# Patient Record
Sex: Female | Born: 1940
Health system: Southern US, Community
[De-identification: ages and names within clinical notes are randomized; demographics above are authoritative.]

## PROBLEM LIST (undated history)

## (undated) DIAGNOSIS — U071 COVID-19: Secondary | ICD-10-CM

## (undated) DIAGNOSIS — I10 Essential (primary) hypertension: Secondary | ICD-10-CM

## (undated) DIAGNOSIS — F32A Depression, unspecified: Secondary | ICD-10-CM

## (undated) DIAGNOSIS — I701 Atherosclerosis of renal artery: Secondary | ICD-10-CM

## (undated) DIAGNOSIS — M51369 Other intervertebral disc degeneration, lumbar region without mention of lumbar back pain or lower extremity pain: Secondary | ICD-10-CM

## (undated) DIAGNOSIS — F329 Major depressive disorder, single episode, unspecified: Secondary | ICD-10-CM

## (undated) DIAGNOSIS — I5189 Other ill-defined heart diseases: Secondary | ICD-10-CM

## (undated) DIAGNOSIS — C801 Malignant (primary) neoplasm, unspecified: Secondary | ICD-10-CM

## (undated) DIAGNOSIS — E7800 Pure hypercholesterolemia, unspecified: Secondary | ICD-10-CM

## (undated) DIAGNOSIS — I739 Peripheral vascular disease, unspecified: Secondary | ICD-10-CM

## (undated) DIAGNOSIS — M5430 Sciatica, unspecified side: Secondary | ICD-10-CM

## (undated) DIAGNOSIS — R079 Chest pain, unspecified: Secondary | ICD-10-CM

## (undated) DIAGNOSIS — F419 Anxiety disorder, unspecified: Secondary | ICD-10-CM

## (undated) DIAGNOSIS — J310 Chronic rhinitis: Secondary | ICD-10-CM

## (undated) DIAGNOSIS — I773 Arterial fibromuscular dysplasia: Secondary | ICD-10-CM

## (undated) DIAGNOSIS — E039 Hypothyroidism, unspecified: Secondary | ICD-10-CM

## (undated) DIAGNOSIS — J329 Chronic sinusitis, unspecified: Secondary | ICD-10-CM

## (undated) DIAGNOSIS — M5136 Other intervertebral disc degeneration, lumbar region: Secondary | ICD-10-CM

## (undated) DIAGNOSIS — I2510 Atherosclerotic heart disease of native coronary artery without angina pectoris: Secondary | ICD-10-CM

## (undated) DIAGNOSIS — J40 Bronchitis, not specified as acute or chronic: Secondary | ICD-10-CM

## (undated) HISTORY — DX: Arterial fibromuscular dysplasia: I77.3

## (undated) HISTORY — DX: Peripheral vascular disease, unspecified: I73.9

## (undated) HISTORY — DX: Atherosclerotic heart disease of native coronary artery without angina pectoris: I25.10

## (undated) HISTORY — DX: Sciatica, unspecified side: M54.30

## (undated) HISTORY — DX: Anxiety disorder, unspecified: F41.9

## (undated) HISTORY — DX: Bronchitis, not specified as acute or chronic: J40

## (undated) HISTORY — DX: Other ill-defined heart diseases: I51.89

## (undated) HISTORY — DX: Chronic sinusitis, unspecified: J32.9

## (undated) HISTORY — DX: Depression, unspecified: F32.A

## (undated) HISTORY — DX: Chest pain, unspecified: R07.9

## (undated) HISTORY — PX: CARPAL TUNNEL RELEASE: SHX101

## (undated) HISTORY — DX: Atherosclerosis of renal artery: I70.1

## (undated) HISTORY — DX: Major depressive disorder, single episode, unspecified: F32.9

## (undated) HISTORY — PX: APPENDECTOMY: SHX54

## (undated) HISTORY — DX: Chronic rhinitis: J31.0

## (undated) HISTORY — DX: Essential (primary) hypertension: I10

---

## 1998-09-09 HISTORY — PX: ABDOMINAL HYSTERECTOMY: SHX81

## 1998-12-05 ENCOUNTER — Other Ambulatory Visit: Admission: RE | Admit: 1998-12-05 | Discharge: 1998-12-05 | Payer: Self-pay | Admitting: Obstetrics & Gynecology

## 1999-02-09 ENCOUNTER — Encounter: Payer: Self-pay | Admitting: Obstetrics & Gynecology

## 1999-02-14 ENCOUNTER — Inpatient Hospital Stay (HOSPITAL_COMMUNITY): Admission: RE | Admit: 1999-02-14 | Discharge: 1999-02-17 | Payer: Self-pay | Admitting: Obstetrics & Gynecology

## 1999-08-11 LAB — HM PAP SMEAR

## 2002-01-08 ENCOUNTER — Encounter (INDEPENDENT_AMBULATORY_CARE_PROVIDER_SITE_OTHER): Payer: Self-pay | Admitting: *Deleted

## 2004-06-26 ENCOUNTER — Other Ambulatory Visit: Admission: RE | Admit: 2004-06-26 | Discharge: 2004-06-26 | Payer: Self-pay | Admitting: Obstetrics & Gynecology

## 2004-07-25 ENCOUNTER — Ambulatory Visit: Payer: Self-pay | Admitting: Internal Medicine

## 2004-10-24 ENCOUNTER — Ambulatory Visit: Payer: Self-pay | Admitting: Family Medicine

## 2004-12-26 ENCOUNTER — Ambulatory Visit: Payer: Self-pay | Admitting: Internal Medicine

## 2005-01-16 ENCOUNTER — Ambulatory Visit: Payer: Self-pay | Admitting: Internal Medicine

## 2005-04-03 ENCOUNTER — Ambulatory Visit: Payer: Self-pay | Admitting: Internal Medicine

## 2005-10-09 ENCOUNTER — Ambulatory Visit: Payer: Self-pay | Admitting: Internal Medicine

## 2005-10-17 ENCOUNTER — Ambulatory Visit: Payer: Self-pay

## 2005-11-06 ENCOUNTER — Ambulatory Visit: Payer: Self-pay | Admitting: Internal Medicine

## 2005-11-15 ENCOUNTER — Ambulatory Visit: Payer: Self-pay | Admitting: Internal Medicine

## 2005-11-21 ENCOUNTER — Ambulatory Visit (HOSPITAL_COMMUNITY): Admission: RE | Admit: 2005-11-21 | Discharge: 2005-11-21 | Payer: Self-pay | Admitting: Internal Medicine

## 2005-12-04 ENCOUNTER — Ambulatory Visit: Payer: Self-pay | Admitting: Internal Medicine

## 2005-12-11 ENCOUNTER — Ambulatory Visit: Payer: Self-pay | Admitting: Internal Medicine

## 2006-01-01 ENCOUNTER — Ambulatory Visit: Payer: Self-pay | Admitting: Internal Medicine

## 2006-06-11 ENCOUNTER — Ambulatory Visit: Payer: Self-pay | Admitting: Internal Medicine

## 2006-11-18 ENCOUNTER — Ambulatory Visit: Payer: Self-pay | Admitting: Internal Medicine

## 2006-11-18 LAB — CONVERTED CEMR LAB
BUN: 9 mg/dL (ref 6–23)
Calcium: 10.1 mg/dL (ref 8.4–10.5)
Chloride: 95 meq/L — ABNORMAL LOW (ref 96–112)
GFR calc non Af Amer: 89 mL/min
Glucose, Bld: 94 mg/dL (ref 70–99)
TSH: 4.35 microintl units/mL (ref 0.35–5.50)

## 2007-07-27 ENCOUNTER — Telehealth: Payer: Self-pay | Admitting: Internal Medicine

## 2007-07-29 ENCOUNTER — Telehealth: Payer: Self-pay | Admitting: Internal Medicine

## 2007-07-30 DIAGNOSIS — M479 Spondylosis, unspecified: Secondary | ICD-10-CM | POA: Insufficient documentation

## 2007-07-30 DIAGNOSIS — I10 Essential (primary) hypertension: Secondary | ICD-10-CM | POA: Insufficient documentation

## 2007-07-30 DIAGNOSIS — F3289 Other specified depressive episodes: Secondary | ICD-10-CM | POA: Insufficient documentation

## 2007-07-30 DIAGNOSIS — G5600 Carpal tunnel syndrome, unspecified upper limb: Secondary | ICD-10-CM | POA: Insufficient documentation

## 2007-07-30 DIAGNOSIS — F329 Major depressive disorder, single episode, unspecified: Secondary | ICD-10-CM | POA: Insufficient documentation

## 2007-10-30 ENCOUNTER — Ambulatory Visit: Payer: Self-pay | Admitting: Internal Medicine

## 2007-10-30 DIAGNOSIS — E039 Hypothyroidism, unspecified: Secondary | ICD-10-CM | POA: Insufficient documentation

## 2007-10-30 DIAGNOSIS — F419 Anxiety disorder, unspecified: Secondary | ICD-10-CM | POA: Insufficient documentation

## 2007-10-30 DIAGNOSIS — N951 Menopausal and female climacteric states: Secondary | ICD-10-CM | POA: Insufficient documentation

## 2007-10-30 LAB — CONVERTED CEMR LAB
BUN: 9 mg/dL (ref 6–23)
Calcium: 9.7 mg/dL (ref 8.4–10.5)
Chloride: 98 meq/L (ref 96–112)
GFR calc non Af Amer: 89 mL/min
TSH: 4.21 microintl units/mL (ref 0.35–5.50)

## 2007-11-03 ENCOUNTER — Encounter: Payer: Self-pay | Admitting: Internal Medicine

## 2007-11-13 ENCOUNTER — Encounter: Payer: Self-pay | Admitting: Internal Medicine

## 2007-11-22 ENCOUNTER — Encounter: Payer: Self-pay | Admitting: Internal Medicine

## 2007-12-17 ENCOUNTER — Encounter (INDEPENDENT_AMBULATORY_CARE_PROVIDER_SITE_OTHER): Payer: Self-pay | Admitting: *Deleted

## 2008-01-13 ENCOUNTER — Ambulatory Visit: Payer: Self-pay | Admitting: Internal Medicine

## 2008-01-13 LAB — CONVERTED CEMR LAB
Folate: >20 ng/mL
Total CK: 91 units/L (ref 7–177)
Vitamin B-12: 711 pg/mL (ref 211–911)

## 2008-01-15 ENCOUNTER — Encounter: Payer: Self-pay | Admitting: Internal Medicine

## 2008-06-26 ENCOUNTER — Encounter: Payer: Self-pay | Admitting: Internal Medicine

## 2008-06-28 ENCOUNTER — Encounter: Payer: Self-pay | Admitting: Internal Medicine

## 2008-06-29 ENCOUNTER — Encounter: Payer: Self-pay | Admitting: Internal Medicine

## 2008-07-19 ENCOUNTER — Ambulatory Visit (HOSPITAL_BASED_OUTPATIENT_CLINIC_OR_DEPARTMENT_OTHER): Admission: RE | Admit: 2008-07-19 | Discharge: 2008-07-19 | Payer: Self-pay | Admitting: Orthopedic Surgery

## 2008-09-16 ENCOUNTER — Ambulatory Visit (HOSPITAL_BASED_OUTPATIENT_CLINIC_OR_DEPARTMENT_OTHER): Admission: RE | Admit: 2008-09-16 | Discharge: 2008-09-16 | Payer: Self-pay | Admitting: Orthopedic Surgery

## 2008-09-23 ENCOUNTER — Telehealth: Payer: Self-pay | Admitting: Internal Medicine

## 2008-09-27 ENCOUNTER — Ambulatory Visit: Payer: Self-pay | Admitting: Internal Medicine

## 2008-09-27 DIAGNOSIS — M5441 Lumbago with sciatica, right side: Secondary | ICD-10-CM | POA: Insufficient documentation

## 2008-09-28 ENCOUNTER — Telehealth: Payer: Self-pay | Admitting: Internal Medicine

## 2008-10-20 ENCOUNTER — Telehealth: Payer: Self-pay | Admitting: Internal Medicine

## 2008-10-21 ENCOUNTER — Encounter: Payer: Self-pay | Admitting: Internal Medicine

## 2008-11-04 ENCOUNTER — Telehealth: Payer: Self-pay | Admitting: Internal Medicine

## 2008-11-09 ENCOUNTER — Telehealth: Payer: Self-pay | Admitting: Internal Medicine

## 2008-11-10 ENCOUNTER — Encounter: Payer: Self-pay | Admitting: Internal Medicine

## 2008-11-10 ENCOUNTER — Encounter: Admission: RE | Admit: 2008-11-10 | Discharge: 2008-11-10 | Payer: Self-pay | Admitting: Internal Medicine

## 2008-11-11 ENCOUNTER — Telehealth: Payer: Self-pay | Admitting: Internal Medicine

## 2008-11-14 ENCOUNTER — Emergency Department: Payer: Self-pay | Admitting: Emergency Medicine

## 2008-11-16 ENCOUNTER — Ambulatory Visit: Payer: Self-pay | Admitting: Internal Medicine

## 2008-11-21 ENCOUNTER — Telehealth: Payer: Self-pay | Admitting: Internal Medicine

## 2008-12-08 HISTORY — PX: OTHER SURGICAL HISTORY: SHX169

## 2008-12-12 ENCOUNTER — Encounter: Payer: Self-pay | Admitting: Internal Medicine

## 2008-12-15 ENCOUNTER — Encounter: Payer: Self-pay | Admitting: Internal Medicine

## 2009-01-17 ENCOUNTER — Ambulatory Visit: Payer: Self-pay | Admitting: Internal Medicine

## 2009-01-17 LAB — CONVERTED CEMR LAB
Calcium: 9.6 mg/dL (ref 8.4–10.5)
Creatinine, Ser: 0.7 mg/dL (ref 0.4–1.2)
GFR calc non Af Amer: 88.27 mL/min (ref >60)
HDL: 92.1 mg/dL (ref >39.00)
Hemoglobin, Urine: NEGATIVE
Ketones, ur: NEGATIVE mg/dL
Sodium: 135 meq/L (ref 135–145)
Specific Gravity, Urine: <=1.005 (ref 1.000–1.030)
TSH: 2.4 microintl units/mL (ref 0.35–5.50)
Triglycerides: 110 mg/dL (ref 0.0–149.0)
Urobilinogen, UA: 0.2 (ref 0.0–1.0)
VLDL: 22 mg/dL (ref 0.0–40.0)

## 2009-02-15 ENCOUNTER — Encounter: Payer: Self-pay | Admitting: Neurosurgery

## 2009-03-09 ENCOUNTER — Encounter: Payer: Self-pay | Admitting: Neurosurgery

## 2009-03-21 ENCOUNTER — Telehealth (INDEPENDENT_AMBULATORY_CARE_PROVIDER_SITE_OTHER): Payer: Self-pay | Admitting: *Deleted

## 2009-03-22 ENCOUNTER — Ambulatory Visit: Payer: Self-pay | Admitting: Internal Medicine

## 2009-03-22 DIAGNOSIS — M26629 Arthralgia of temporomandibular joint, unspecified side: Secondary | ICD-10-CM | POA: Insufficient documentation

## 2009-04-04 ENCOUNTER — Telehealth: Payer: Self-pay | Admitting: Internal Medicine

## 2009-04-09 ENCOUNTER — Encounter: Admission: RE | Admit: 2009-04-09 | Discharge: 2009-04-09 | Payer: Self-pay | Admitting: Neurosurgery

## 2009-04-09 ENCOUNTER — Encounter: Payer: Self-pay | Admitting: Neurosurgery

## 2009-04-11 ENCOUNTER — Telehealth: Payer: Self-pay | Admitting: Internal Medicine

## 2009-04-19 ENCOUNTER — Observation Stay (HOSPITAL_COMMUNITY): Admission: AD | Admit: 2009-04-19 | Discharge: 2009-04-20 | Payer: Self-pay | Admitting: Internal Medicine

## 2009-04-19 ENCOUNTER — Telehealth: Payer: Self-pay | Admitting: Internal Medicine

## 2009-04-19 ENCOUNTER — Ambulatory Visit: Payer: Self-pay | Admitting: Internal Medicine

## 2009-04-19 ENCOUNTER — Ambulatory Visit: Payer: Self-pay | Admitting: Cardiology

## 2009-04-20 ENCOUNTER — Ambulatory Visit: Payer: Self-pay

## 2009-04-20 ENCOUNTER — Encounter: Payer: Self-pay | Admitting: Cardiology

## 2009-04-24 ENCOUNTER — Encounter: Payer: Self-pay | Admitting: Internal Medicine

## 2009-04-25 ENCOUNTER — Ambulatory Visit: Payer: Self-pay | Admitting: Internal Medicine

## 2009-05-02 ENCOUNTER — Telehealth: Payer: Self-pay | Admitting: Internal Medicine

## 2009-05-03 ENCOUNTER — Encounter: Admission: RE | Admit: 2009-05-03 | Discharge: 2009-05-03 | Payer: Self-pay | Admitting: Internal Medicine

## 2009-05-03 ENCOUNTER — Telehealth: Payer: Self-pay | Admitting: Internal Medicine

## 2009-05-22 ENCOUNTER — Telehealth: Payer: Self-pay | Admitting: Internal Medicine

## 2009-05-25 ENCOUNTER — Encounter: Admission: RE | Admit: 2009-05-25 | Discharge: 2009-05-25 | Payer: Self-pay | Admitting: Internal Medicine

## 2009-06-06 ENCOUNTER — Ambulatory Visit: Payer: Self-pay | Admitting: Internal Medicine

## 2009-06-23 ENCOUNTER — Telehealth: Payer: Self-pay | Admitting: Internal Medicine

## 2009-06-26 ENCOUNTER — Telehealth: Payer: Self-pay | Admitting: Internal Medicine

## 2009-06-27 ENCOUNTER — Ambulatory Visit: Payer: Self-pay | Admitting: Internal Medicine

## 2009-06-27 DIAGNOSIS — L501 Idiopathic urticaria: Secondary | ICD-10-CM | POA: Insufficient documentation

## 2009-08-01 ENCOUNTER — Ambulatory Visit: Payer: Self-pay | Admitting: Internal Medicine

## 2009-08-01 ENCOUNTER — Telehealth: Payer: Self-pay | Admitting: Internal Medicine

## 2009-10-20 ENCOUNTER — Telehealth: Payer: Self-pay | Admitting: Internal Medicine

## 2009-11-21 ENCOUNTER — Telehealth: Payer: Self-pay | Admitting: Internal Medicine

## 2009-11-24 ENCOUNTER — Telehealth: Payer: Self-pay | Admitting: Internal Medicine

## 2009-12-14 ENCOUNTER — Telehealth: Payer: Self-pay | Admitting: Internal Medicine

## 2009-12-19 ENCOUNTER — Ambulatory Visit: Payer: Self-pay | Admitting: Internal Medicine

## 2009-12-19 DIAGNOSIS — G4700 Insomnia, unspecified: Secondary | ICD-10-CM | POA: Insufficient documentation

## 2009-12-19 DIAGNOSIS — R259 Unspecified abnormal involuntary movements: Secondary | ICD-10-CM | POA: Insufficient documentation

## 2009-12-19 DIAGNOSIS — N9089 Other specified noninflammatory disorders of vulva and perineum: Secondary | ICD-10-CM | POA: Insufficient documentation

## 2009-12-23 ENCOUNTER — Encounter: Payer: Self-pay | Admitting: Internal Medicine

## 2009-12-25 ENCOUNTER — Telehealth: Payer: Self-pay | Admitting: Internal Medicine

## 2009-12-26 LAB — CONVERTED CEMR LAB
Metaneph Total, Ur: 453 ug/24hr (ref 224–832)
Normetanephrine, 24H Ur: 320 (ref 122–676)

## 2010-01-29 ENCOUNTER — Ambulatory Visit: Payer: Self-pay | Admitting: Internal Medicine

## 2010-01-29 LAB — CONVERTED CEMR LAB
Albumin: 4.2 g/dL (ref 3.5–5.2)
Alkaline Phosphatase: 64 units/L (ref 39–117)
Basophils Absolute: 0 10*3/uL (ref 0.0–0.1)
Basophils Relative: 0.8 % (ref 0.0–3.0)
CO2: 32 meq/L (ref 19–32)
Chloride: 91 meq/L — ABNORMAL LOW (ref 96–112)
Cholesterol: 281 mg/dL — ABNORMAL HIGH (ref 0–200)
Creatinine, Ser: 0.6 mg/dL (ref 0.4–1.2)
Eosinophils Relative: 3.1 % (ref 0.0–5.0)
HCT: 39.3 % (ref 36.0–46.0)
HDL: 115.7 mg/dL (ref >39.00)
Hemoglobin: 13.8 g/dL (ref 12.0–15.0)
Lymphocytes Relative: 21.7 % (ref 12.0–46.0)
Lymphs Abs: 1.1 10*3/uL (ref 0.7–4.0)
Monocytes Relative: 9.5 % (ref 3.0–12.0)
Neutro Abs: 3.2 10*3/uL (ref 1.4–7.7)
Potassium: 4.1 meq/L (ref 3.5–5.1)
RBC: 4.08 M/uL (ref 3.87–5.11)
TSH: 5.23 microintl units/mL (ref 0.35–5.50)
Total CHOL/HDL Ratio: 2
Total Protein: 6.5 g/dL (ref 6.0–8.3)
Triglycerides: 97 mg/dL (ref 0.0–149.0)
VLDL: 19.4 mg/dL (ref 0.0–40.0)
WBC: 5 10*3/uL (ref 4.5–10.5)

## 2010-02-07 ENCOUNTER — Ambulatory Visit: Payer: Self-pay | Admitting: Family Medicine

## 2010-02-07 DIAGNOSIS — M2170 Unequal limb length (acquired), unspecified site: Secondary | ICD-10-CM | POA: Insufficient documentation

## 2010-02-22 ENCOUNTER — Encounter: Payer: Self-pay | Admitting: Internal Medicine

## 2010-02-27 ENCOUNTER — Telehealth: Payer: Self-pay | Admitting: Internal Medicine

## 2010-02-27 ENCOUNTER — Telehealth: Payer: Self-pay | Admitting: Family Medicine

## 2010-03-01 ENCOUNTER — Telehealth: Payer: Self-pay | Admitting: Internal Medicine

## 2010-03-21 ENCOUNTER — Ambulatory Visit: Payer: Self-pay | Admitting: Internal Medicine

## 2010-03-21 DIAGNOSIS — E785 Hyperlipidemia, unspecified: Secondary | ICD-10-CM | POA: Insufficient documentation

## 2010-03-26 ENCOUNTER — Telehealth: Payer: Self-pay | Admitting: Internal Medicine

## 2010-04-02 ENCOUNTER — Ambulatory Visit: Payer: Self-pay | Admitting: Internal Medicine

## 2010-04-03 ENCOUNTER — Encounter: Payer: Self-pay | Admitting: Internal Medicine

## 2010-04-04 ENCOUNTER — Encounter: Payer: Self-pay | Admitting: Internal Medicine

## 2010-04-06 ENCOUNTER — Telehealth: Payer: Self-pay | Admitting: Internal Medicine

## 2010-04-09 ENCOUNTER — Encounter (INDEPENDENT_AMBULATORY_CARE_PROVIDER_SITE_OTHER): Payer: Self-pay | Admitting: *Deleted

## 2010-05-10 ENCOUNTER — Ambulatory Visit: Payer: Self-pay | Admitting: Internal Medicine

## 2010-05-11 ENCOUNTER — Ambulatory Visit: Payer: Self-pay | Admitting: Internal Medicine

## 2010-05-15 ENCOUNTER — Telehealth: Payer: Self-pay | Admitting: Internal Medicine

## 2010-05-21 ENCOUNTER — Telehealth: Payer: Self-pay | Admitting: Internal Medicine

## 2010-05-22 ENCOUNTER — Telehealth: Payer: Self-pay | Admitting: Internal Medicine

## 2010-05-25 ENCOUNTER — Ambulatory Visit: Payer: Self-pay | Admitting: Internal Medicine

## 2010-05-28 ENCOUNTER — Encounter: Payer: Self-pay | Admitting: Internal Medicine

## 2010-05-29 ENCOUNTER — Telehealth: Payer: Self-pay | Admitting: Internal Medicine

## 2010-08-23 ENCOUNTER — Ambulatory Visit: Payer: Self-pay | Admitting: Internal Medicine

## 2010-09-12 ENCOUNTER — Ambulatory Visit (HOSPITAL_COMMUNITY)
Admission: RE | Admit: 2010-09-12 | Discharge: 2010-09-12 | Payer: Self-pay | Source: Home / Self Care | Attending: Internal Medicine | Admitting: Internal Medicine

## 2010-09-14 ENCOUNTER — Ambulatory Visit
Admission: RE | Admit: 2010-09-14 | Discharge: 2010-09-14 | Payer: Self-pay | Source: Home / Self Care | Attending: Internal Medicine | Admitting: Internal Medicine

## 2010-09-14 DIAGNOSIS — R059 Cough, unspecified: Secondary | ICD-10-CM | POA: Insufficient documentation

## 2010-09-14 DIAGNOSIS — R05 Cough: Secondary | ICD-10-CM | POA: Insufficient documentation

## 2010-09-14 DIAGNOSIS — R198 Other specified symptoms and signs involving the digestive system and abdomen: Secondary | ICD-10-CM | POA: Insufficient documentation

## 2010-10-09 NOTE — Progress Notes (Signed)
Summary: Zostavax  Phone Note Call from Patient   Caller: Patient Summary of Call: Patient called LMOVM wanting MD to advise if ok for her to have zostavax. She is requesting rx be faxed to [facility] pharmacy at [phone]. Please advise Thanks Initial call taken by: Rock Nephew CMA,  May 21, 2010 10:34 AM  Follow-up for Phone Call        OK for zostavax. Can send Rx to pharmacy-Rx on triage cart. Follow-up by: [name],  May 21, 2010 10:49 AM    New/Updated Medications: ZOSTAVAX 51884 UNT/0.65ML SOLR (ZOSTER VACCINE LIVE) as directed Prescriptions: ZOSTAVAX 16606 UNT/0.65ML SOLR (ZOSTER VACCINE LIVE) as directed  #1 x 0   Entered by:   Ami Bullins CMA   Authorized by:   [name]   Signed by:   Bill Salinas CMA on 05/21/2010   Method used:   Electronically to        [facility] [phone]* (retail)       [address] [place], [place]  [address]       Ph: [phone]       Fax: [phone]   RxID:   [id number]

## 2010-10-09 NOTE — Miscellaneous (Signed)
Summary: Skin Test/[facility]  Skin Test/[facility]   Imported By: Sherian Rein 05/31/2010 08:03:15  _____________________________________________________________________  External Attachment:    Type:   Image     Comment:   External Document

## 2010-10-09 NOTE — Progress Notes (Signed)
Summary: Results  Phone Note Call from Patient Call back at Home Phone [phone]   Summary of Call: Patient left message on triage that she has already talked with you, but would like a copy of test results mailed to her. Patient would also like to know what you recommend for follow up. Please advise. Initial call taken by: Lucious Groves,  March 01, 2010 4:23 PM  Follow-up for Phone Call        report on desk for mailing.   She should take calcium 1200mg  daily, Vit D [phone] international units daily.  F/U DXA 2 years. Follow-up by: [name],  March 02, 2010 9:06 AM  Additional Follow-up for Phone Call Additional follow up Details #1::        Patient notified. Additional Follow-up by: Lucious Groves,  March 02, 2010 9:26 AM

## 2010-10-09 NOTE — Progress Notes (Signed)
Summary: REFILLS  Phone Note Refill Request   Refills Requested: Medication #1:  TEMAZEPAM 15 MG CAPS 1 by mouth at bedtime for sleep  Medication #2:  LEVOTHYROXINE SODIUM 25 MCG  TABS Take 1 tablet by mouth once a day  Medication #3:  HYDROCHLOROTHIAZIDE 25 MG  TABS Take 1 tablet by mouth once a day TO go to [employer] [place]?   Initial call taken by: Lamar Sprinkles, CMA,  October 20, 2009 2:53 PM  Follow-up for Phone Call        OK FOR [facility] REFILLS. Please do temazepam Rx on Monday Follow-up by: [name],  October 20, 2009 6:02 PM  Additional Follow-up for Phone Call Additional follow up Details #1::        Pt informed  Additional Follow-up by: Lamar Sprinkles, CMA,  October 23, 2009 3:56 PM    Additional Follow-up for Phone Call Additional follow up Details #2::    Faxed Follow-up by: Lamar Sprinkles, CMA,  October 24, 2009 11:29 AM  Prescriptions: TEMAZEPAM 15 MG CAPS (TEMAZEPAM) 1 by mouth at bedtime for sleep  #90 x 1   Entered by:   Lamar Sprinkles, CMA   Authorized by:   [name]   Signed by:   Lamar Sprinkles, CMA on 10/23/2009   Method used:   Print then Give to Patient   RxID:   [id number] LEVOTHYROXINE SODIUM 25 MCG  TABS (LEVOTHYROXINE SODIUM) Take 1 tablet by mouth once a day  #90 Each x 3   Entered and Authorized by:   [name]   Signed by:   [name] on 10/20/2009   Method used:   Electronically to        [facility]* (mail-order)             ,          Ph: [phone]       Fax: [phone]   RxID:   [id number] HYDROCHLOROTHIAZIDE 25 MG  TABS (HYDROCHLOROTHIAZIDE) Take 1 tablet by mouth once a day  #90 x 3   Entered and Authorized by:   [name]   Signed by:   [name] on 10/20/2009   Method used:   Electronically to        [facility]* (mail-order)             ,          Ph: [phone]       Fax: [phone]   RxID:   [id number]

## 2010-10-09 NOTE — Assessment & Plan Note (Signed)
Summary: cpx-lb   Vital Signs:  Patient profile:   70 year old female Height:      65 inches Weight:      139 pounds BMI:     23.21 O2 Sat:      99 % on Room air Temp:     97.9 degrees F oral Pulse rate:   63 / minute BP sitting:   142 / 98  (left arm) Cuff size:   regular  Vitals Entered By: Bill Salinas CMA (Jan 29, 2010 11:36 AM)  O2 Flow:  Room air CC: pt here for cpx, she has been taking lisinopril 20 mg 1 bid and states it seem to work better for her. pt has never had shingles vaccine/ ab  Vision Screening:      Vision Comments: Normal eye exam Aug of 2010   Primary Care Provider:  [name]  CC:  pt here for cpx and she has been taking lisinopril 20 mg 1 bid and states it seem to work better for her. pt has never had shingles vaccine/ ab.  History of Present Illness: Patient presents for routine exam and follow-up. She has switched her medication to lisinopril 20mg  two times a day and has had much better control of BP.   She has lost height. She also has different leg length which she feels contributes to her back pain. She is amenable to seeing Dr. Dallas Schimke for evaluation and possible recommendation for lift or orthotics.   In regard to sleep : she had tried to wean off temazepam but had more trouble sleeping.  She has a prolonged cough since 2022/10/14. She continues to have a dry hacking cough that did not resolve with stopping ACE-I. She has clear drainage but the drainage is clear. She is not taking any anti-histamines.   She has had a routine gyn/pelvic exam in Nov '10. She had a mammogram in the fall '10. Last colonoscopy was done in '03   Current Medications (verified): 1)  Hydrochlorothiazide 25 Mg  Tabs (Hydrochlorothiazide) .... Take 1 Tablet By Mouth Once A Day 2)  Levothyroxine Sodium 25 Mcg  Tabs (Levothyroxine Sodium) .... Take 1 Tablet By Mouth Once A Day 3)  Ibuprofen 600 Mg Tabs (Ibuprofen) .[name].. 1 By Mouth Three Times A Day 4)  Lisinopril  40 Mg Tabs (Lisinopril) .[name].. 1 Once Daily 5)  Triamcinolone Acetonide 0.1 % Oint (Triamcinolone Acetonide) .... 2x A Day 6)  Aspirin Low Dose 81 Mg Tabs (Aspirin) .[name].. 1 Once Daily  Allergies (verified): 1)  ! Codeine 2)  * Int. Atenolol  Past History:  Past Medical History: Last updated: 03/22/2009 Hypertension Hyperthyroidism Depression carpal tunnel anxiety myalgias sciatica  Past Surgical History: Last updated: 03/22/2009 Hysterectomy Stress Cardiolite (10/17/2005) Carpal tunnel release: Right - Nov '09; Left - Jan '10 ( Dr. Teressa Senter) L3-4 hemilaminectomy, L4-5 micro-diskectomy April '10 (Dr. Rise Mu)  Family History: Last updated: 02-10-09 father - deceased @ 2: CAD/MI, CVA, HTN, Lipids, DM mother - 69: ovarian Ca, HTN, Lipids Son with bladder cancer with mets Neg- breast or colon cancer   Social History: Last updated: 01-12-10 Mother deceased @ 67: had ovarian cancer, died of PNA 10/14/22 2023/02/11) [employer] [facility] married - '61 - 33yrs/divorced; married '82 step - son work: full time homemaker end-of-life issues: no futile or heroic care, full palliative care if needed. Does want attempt at resuscitation (May '10)  Risk Factors: Alcohol Use: 0 (04/19/2009) Caffeine Use: 0 (09/27/2008) Diet: healthy diet (04/19/2009)  Exercise: no (04/19/2009)  Risk Factors: Smoking Status: never (04/19/2009) Passive Smoke Exposure: no (09/27/2008)  Review of Systems       The patient complains of prolonged cough.  The patient denies anorexia, fever, weight loss, weight gain, decreased hearing, chest pain, syncope, dyspnea on exertion, peripheral edema, headaches, abdominal pain, hematochezia, severe indigestion/heartburn, muscle weakness, difficulty walking, depression, enlarged lymph nodes, and angioedema.    Physical Exam  General:  WNWD white female in no distress Head:  normocephalic, atraumatic, and no abnormalities observed.   Eyes:  vision grossly intact,  pupils equal, pupils round, corneas and lenses clear, and no injection.   Ears:  cerumen right EAC- after irrigation TM normal. Left EAC/TM normal Nose:  no external deformity and no external erythema.   Mouth:  Oral mucosa and oropharynx without lesions or exudates.  Teeth in good repair. Neck:  supple, full ROM, no thyromegaly, and no carotid bruits.   Chest Wall:  no deformities.   Breasts:  deferred to gyn Lungs:  Normal respiratory effort, chest expands symmetrically. Lungs are clear to auscultation, no crackles or wheezes. Heart:  Normal rate and regular rhythm. S1 and S2 normal without gallop, murmur, click, rub or other extra sounds. Abdomen:  soft, non-tender, normal bowel sounds, no distention, no rebound tenderness, and no hepatomegaly.   Genitalia:  deferred to gyn Msk:  normal ROM, no joint tenderness, no joint swelling, no redness over joints, and no joint instability.   Pulses:  2+ radial and DP pulses Extremities:  No clubbing, cyanosis, edema, or deformity noted with normal full range of motion of all joints.  Leg length no measured Neurologic:  alert & oriented X3, cranial nerves II-XII intact, strength normal in all extremities, sensation intact to light touch, gait normal, and DTRs symmetrical and normal.   Skin:  turgor normal, color normal, no suspicious lesions, no ulcerations, and no edema.   Cervical Nodes:  no anterior cervical adenopathy and no posterior cervical adenopathy.   Psych:  Oriented X3, memory intact for recent and remote, normally interactive, and good eye contact.     Impression & Recommendations:  Problem # 1:  COUGH (ICD-786.2) Not ACE-I. Probably allergy.  Plan - CXR           otc anthistamine, i.e. generic claritin.  Orders: T-2 View CXR (71020TC)  Problem # 2:  INSOMNIA, CHRONIC (ICD-307.42) OK to continue with temazepman. Discussed risks vs benefits.  Problem # 3:  UNSPECIFIED HYPOTHYROIDISM (ICD-244.9) For lab today with  recommendations to follow.  Her updated medication list for this problem includes:    Levothyroxine Sodium 25 Mcg Tabs (Levothyroxine sodium) .[name]... Take 1 tablet by mouth once a day  Problem # 4:  SCIATICA, RIGHT (ICD-724.3) Patient has had multiple surgeries. She is concerned about having one short leg and how this may exacerbate her low back pain.  Plan - refer to Dr. Dallas Schimke for assessment and treatment, i.e. possible lift or prosthetic for shoes.   Her updated medication list for this problem includes:    Ibuprofen 600 Mg Tabs (Ibuprofen) .[name]... 1 by mouth three times a day    Aspirin Low Dose 81 Mg Tabs (Aspirin) .[name]... 1 once daily  Orders: TLB-CBC Platelet - w/Differential (85025-CBCD) Sports Medicine (Sports Med)  Problem # 5:  HYPERTENSION (ICD-401.9)  BP is much better controlled on lisinopril 20mg  two times a day. She is tolerating this well.  Plan - continue this regimen.  Her updated medication list for this problem includes:  Hydrochlorothiazide 25 Mg Tabs (Hydrochlorothiazide) .[name]... Take 1 tablet by mouth once a day    Lisinopril 20 Mg Tabs (Lisinopril) .[name]... 1 by mouth two times a day  Orders: TLB-BMP (Basic Metabolic Panel-BMET) (80048-METABOL) TLB-TSH (Thyroid Stimulating Hormone) (84443-TSH)  Problem # 6:  Preventive Health Care (ICD-V70.0)  Patient has a smile on her face. She is dealing with her chronic pain. She recently lost her mother, to whom she was very close as well as being her care-taker for the last 5 years. She was appropriately sad but is making a good adjustment and does not seem to be depressed.  She is current with colorectal cancer screening, gyn health, mammograms and immunizatons for tetnus 9/10 and pneumonia 8/10.  Labs are OK but LDL cholesterol is above goal of 130 or less. Will rcommend 6 months of diet control (low fat) and increased aerobic exercise with repeat labs to follow. If still elevated will need to consider medical therapy.     Chest x- ray is normal.  In summary - a very nice woman with complex medical issues who appears to be doing well. She will return in 6 months or sooner as needed  Complete Medication List: 1)  Hydrochlorothiazide 25 Mg Tabs (Hydrochlorothiazide) .... Take 1 tablet by mouth once a day 2)  Levothyroxine Sodium 25 Mcg Tabs (Levothyroxine sodium) .... Take 1 tablet by mouth once a day 3)  Ibuprofen 600 Mg Tabs (Ibuprofen) .[name].. 1 by mouth three times a day 4)  Lisinopril 20 Mg Tabs (Lisinopril) .[name].. 1 by mouth two times a day 5)  Triamcinolone Acetonide 0.1 % Oint (Triamcinolone acetonide) .... 2x a day 6)  Aspirin Low Dose 81 Mg Tabs (Aspirin) .[name].. 1 once daily  Other Orders: TLB-Lipid Panel (80061-LIPID) TLB-Hepatic/Liver Function Pnl (80076-HEPATIC) Subsequent annual wellness visit with prevention plan (B2841)  DG CHEST 2 VIEW - [id number]   Clinical Data: Cough.  Hypertension.   CHEST - 2 VIEW   Comparison: None.   Findings: Lungs are hyperaerated but clear.  Cardiomediastinal silhouette appears unremarkable.  Bony thorax is intact.   IMPRESSION: Suspicion for COPD.   Read By:  Jonne Ply,  M.D.  Patient: BURDETTE GERGELY Note: All result statuses are Final unless otherwise noted.  Tests: (1) BMP (METABOL)   Sodium               [L]  131 mEq/L                   135-145   Potassium                 4.1 mEq/L                   3.5-5.1   Chloride             [L]  91 mEq/L                    96-112   Carbon Dioxide            32 mEq/L                    19-32   Glucose                   88 mg/dL                    72-53   BUN  9 mg/dL                     8-11   Creatinine                0.6 mg/dL                   9.1-4.7   Calcium                   9.4 mg/dL                   8.2-95.6   GFR                       97.60 mL/min                >60  Tests: (2) TSH (TSH)   FastTSH                   5.23 uIU/mL                 0.35-5.50  Tests:  (3) Lipid Panel (LIPID)   Cholesterol          [H]  281 mg/dL                   2-130     ATP III Classification            Desirable:  < 200 mg/dL                    Borderline High:  200 - 239 mg/dL               High:  > = 240 mg/dL   Triglycerides             97.0 mg/dL                  8.6-578.4     Normal:  <150 mg/dL     Borderline High:  696 - 199 mg/dL   HDL                       295.28 mg/dL                >41.32   VLDL Cholesterol          19.4 mg/dL                  4.4-01.0  CHO/HDL Ratio:  CHD Risk                             2                    Men          Women     1/2 Average Risk     3.4          3.3     Average Risk          5.0          4.4     2X Average Risk          9.6          7.1     3X Average Risk          15.0          11.0  Tests: (4) Hepatic/Liver Function Panel (HEPATIC)   Total Bilirubin           0.7 mg/dL                   1.0-2.7   Direct Bilirubin          0.1 mg/dL                   2.5-3.6   Alkaline Phosphatase      64 U/L                      39-117   AST                       30 U/L                      0-37   ALT                       22 U/L                      0-35   Total Protein             6.5 g/dL                    6.4-4.0   Albumin                   4.2 g/dL                    3.4-7.4  Tests: (5) CBC Platelet w/Diff (CBCD)   White Cell Count          5.0 K/uL                    4.5-10.5   Red Cell Count            4.08 Mil/uL                 3.87-5.11   Hemoglobin                13.8 g/dL                   25.9-56.3   Hematocrit                39.3 %                      36.0-46.0   MCV                       96.3 fl                     78.0-100.0   MCHC                      35.1 g/dL                   87.5-64.3   RDW                       12.6 %                      11.5-14.6   Platelet Count  286.0 K/uL                  150.0-400.0   Neutrophil %              64.9 %                       43.0-77.0   Lymphocyte %              21.7 %                      12.0-46.0   Monocyte %                9.5 %                       3.0-12.0   Eosinophils%              3.1 %                       0.0-5.0   Basophils %               0.8 %                       0.0-3.0   Neutrophill Absolute      3.2 K/uL                    1.4-7.7   Lymphocyte Absolute       1.1 K/uL                    0.7-4.0   Monocyte Absolute         0.5 K/uL                    0.1-1.0  Eosinophils, Absolute                             0.2 K/uL                    0.0-0.7   Basophils Absolute        0.0 K/uL                    0.0-0.1  Tests: (6) Cholesterol LDL - Direct (DIRLDL)  Cholesterol LDL - Direct                             156.2 mg/dL     Optimal:  <161 mg/dL     Near or Above Optimal:  100-129 mg/dL     Borderline High:  096-045 mg/dL     High:  409-811 mg/dL     Very High:  >914 mg/dL

## 2010-10-09 NOTE — Progress Notes (Signed)
Summary: results  Phone Note Call from Patient Call back at Home Phone [phone]   Summary of Call: Pt requesting results of Pulmonary Function Test Initial call taken by: Brenton Grills MA,  April 06, 2010 4:32 PM  Follow-up for Phone Call        Letter was mailed on 27th, will call pt Follow-up by: Lamar Sprinkles, CMA,  April 06, 2010 4:36 PM  Additional Follow-up for Phone Call Additional follow up Details #1::        Pt informed, She continues to have cough and hoarseness. Pt would like to see an allergist. Please advise.  Additional Follow-up by: Lamar Sprinkles, CMA,  April 06, 2010 5:26 PM    Additional Follow-up for Phone Call Additional follow up Details #2::    will refer to Dr. Maple Hudson. [facility] notitified Follow-up by: [name],  April 08, 2010 1:52 PM  Additional Follow-up for Phone Call Additional follow up Details #3:: Details for Additional Follow-up Action Taken: Pt advised and will expect call from [facility] with appt info Additional Follow-up by: Margaret Pyle, CMA,  April 09, 2010 8:51 AM

## 2010-10-09 NOTE — Miscellaneous (Signed)
Summary: Orders Update-pft charges//jwr  Clinical Lists Changes  Orders: Added new Service order of Carbon Monoxide diffusing w/capacity (94720) - Signed Added new Service order of Lung Volumes (94240) - Signed Added new Service order of Spirometry (Pre & Post) (94060) - Signed 

## 2010-10-09 NOTE — Miscellaneous (Signed)
Summary: [facility]  [facility]   Imported By: Sherian Rein 05/31/2010 10:17:59  _____________________________________________________________________  External Attachment:    Type:   Image     Comment:   External Document

## 2010-10-09 NOTE — Letter (Signed)
   [facility] [address] [place], [place]  [address] Phone: [phone]      April 04, 2010   ALERIA MAHEU [address] [place], [place] [address]  RE:  LAB RESULTS  Dear  Ms. Burbach,  The following is an interpretation of your most recent lab tests.  Please take note of any instructions provided or changes to medications that have resulted from your lab work.   Pulmonary function study is interpreted as showing no diffusion capacity abnormality, i.e. no emphysema. There is just a radiographic change but no functional change.   We like good news!!!  Please come see me if you have any questions about these lab results.   Sincerely Yours,    [name]

## 2010-10-09 NOTE — Assessment & Plan Note (Signed)
Summary: SIX MIN WALK- PULM STRESS TEST  Nurse Visit   Vital Signs:  Patient profile:   70 year old female Pulse rate:   83 / minute BP sitting:   142 / 74  Medications Prior to Update: 1)  Hydrochlorothiazide 25 Mg  Tabs (Hydrochlorothiazide) .... Take 1 Tablet By Mouth Once A Day 2)  Levothyroxine Sodium 25 Mcg  Tabs (Levothyroxine Sodium) .... Take 1 Tablet By Mouth Once A Day 3)  Ibuprofen 600 Mg Tabs (Ibuprofen) .[name].. 1 By Mouth Three Times A Day 4)  Lisinopril 20 Mg Tabs (Lisinopril) .[name].. 1 By Mouth Two Times A Day 5)  Triamcinolone Acetonide 0.1 % Oint (Triamcinolone Acetonide) .... 2x A Day 6)  Aspirin Low Dose 81 Mg Tabs (Aspirin) .[name].. 1 Once Daily 7)  Temazepam 15 Mg Caps (Temazepam) .... One Tablet At Bedtime 8)  Fish Oil 1000 Mg Caps (Omega-3 Fatty Acids) .... Two Times A Day 9)  Coq10 100 Mg Caps (Coenzyme Q10) .... Once Daily 10)  Vitamin C 500 Mg Tabs (Ascorbic Acid) .... Once Daily 11)  B Complex 50  Tabs (B Complex Vitamins) .... Once Daily 12)  Vitamin D3 2000 Unit Caps (Cholecalciferol) .... Once Daily 13)  Zinc 25 Mg Tabs (Zinc) .... Once Daily 14)  Calcium-Magnesium 500-250 Mg Tabs (Calcium-Magnesium) .... Two Times A Day 15)  Probiotic  Caps (Probiotic Product) .... Once Daily  Allergies: 1)  ! Codeine 2)  ! * Zytec 3)  ! Claritin 4)  ! Benadryl 5)  ! * Lodrane 6)  * Int. Atenolol  Orders Added: 1)  Pulmonary Stress (6 min walk) [94620]   Six Minute Walk Test Medications taken before test(dose and time): 1)  Hydrochlorothiazide 25 Mg  Tabs (Hydrochlorothiazide) .... Take 1 Tablet By Mouth Once A Day 2)  Levothyroxine Sodium 25 Mcg  Tabs (Levothyroxine Sodium) .... Take 1 Tablet By Mouth Once A Day 3)  Ibuprofen 600 Mg Tabs (Ibuprofen) .[name].. 1 By Mouth Three Times A Day 4)  Lisinopril 20 Mg Tabs (Lisinopril) .[name].. 1 By Mouth Two Times A Day 5)  Triamcinolone Acetonide 0.1 % Oint (Triamcinolone Acetonide) .... 2x A Day 6)  Aspirin Low Dose 81 Mg Tabs  (Aspirin) .[name].. 1 Once Daily 8)  Fish Oil 1000 Mg Caps (Omega-3 Fatty Acids) .... Two Times A Day 9)  Coq10 100 Mg Caps (Coenzyme Q10) .... Once Daily 10)  Vitamin C 500 Mg Tabs (Ascorbic Acid) .... Once Daily 11)  B Complex 50  Tabs (B Complex Vitamins) .... Once Daily 12)  Vitamin D3 2000 Unit Caps (Cholecalciferol) .... Once Daily 13)  Zinc 25 Mg Tabs (Zinc) .... Once Daily 14)  Calcium-Magnesium 500-250 Mg Tabs (Calcium-Magnesium) .... Two Times A Day 15)  Probiotic  Caps (Probiotic Product) .... Once Daily Pt took these meds at 9:00am Supplemental oxygen during the test: No  Lap counter(place a tick mark inside a square for each lap completed) lap 1 complete  lap 2 complete   lap 3 complete   lap 4 complete  lap 5 complete  lap 6 complete  lap 7 complete   lap 8 complete   lap 9 complete  lap 10 complete   Baseline  BP sitting: 142/ 74 Heart rate: 83 Dyspnea ( Borg scale) .5 Fatigue (Borg scale) 0 SPO2 100  End Of Test  BP sitting: 148/ 80 Heart rate: 97 Dyspnea ( Borg scale) 3 Fatigue (Borg scale) 0 SPO2 100  2 Minutes post  BP sitting: 146/  78 Heart rate: 78 SPO2 100  Stopped or paused before six minutes? No  Interpretation: Number of laps  10 X 48 meters =   480 meters+ final partial lap: 12 meters =    492 meters   Total distance walked in six minutes: 492 meters  Tech ID: Tivis Ringer, CNA (May 11, 2010 3:00 PM) Tech Comments Pt completed test w/ 0 rest breaks and 2 complaints: pressure on chest and "tingling in sinus area- behind eyes. these signs were resolved at 2 mins. post  Appended Document: SIX MIN WALK- PULM STRESS TEST ^ minute walk test- normal, with oxygen saturation very good.

## 2010-10-09 NOTE — Progress Notes (Signed)
Summary: Cough  Phone Note Call from Patient   Summary of Call: Pt has not recieved reports from office visit in the mail yet. She left vm c/o continued cough, seems to be worsening. Please advise regarding cough. Initial call taken by: Lamar Sprinkles, CMA,  February 27, 2010 5:21 PM  Follow-up for Phone Call        called patient: 1) DXA normal                         2) cough - she saw ENT [facility] - started new antihistamine  - not tolerated, and flucticasone nasal.                         3) satisfied with eval by Dr. Dallas Schimke but lift did not help. Follow-up by: [name],  February 28, 2010 9:41 AM

## 2010-10-09 NOTE — Miscellaneous (Signed)
Summary: Orders Update-PFT CHARGES   Clinical Lists Changes  Orders: Added new Service order of Spirometry (Pre & Post) (94060) - Signed Added new Service order of Lung Volumes (94240) - Signed Added new Service order of Carbon Monoxide diffusing w/capacity (94720) - Signed 

## 2010-10-09 NOTE — Progress Notes (Signed)
Summary: appt needed  Phone Note Call from Patient Call back at Home Phone [phone]   Summary of Call: Patient left message on triage that she has several issues and would like an appt. Patient c/o prolonged cough, tremor in Left hand, and lump in groin area. Patient was transferred to schedule appt when MD returns on Mon. Initial call taken by: Lucious Groves,  December 14, 2009 2:06 PM

## 2010-10-09 NOTE — Progress Notes (Signed)
Summary: shoe lift   Phone Note Call from Patient Call back at Home Phone [phone]   Caller: Patient Call For: Dr. Patsy Lager Summary of Call: Patient was given temporary lift for shoe. She says that it is causing her a great deal of discomfort with her foot and leg, especially with her knee. She wants to know if she should take it out of shoe, she has a follow up on 03-15-10. Please advise. Initial call taken by: Melody Comas,  February 27, 2010 4:27 PM  Follow-up for Phone Call        Talked with pt.  Does have real leg l discrepancy. if having difficulty and worsening with small lift, told to d/c lift from shoe -- would expect any including custom shoe fabrication may not be tolerated.  Reviewed that continuing activity, adding in pool work, core work is ideal -- she understands.  Follow-up by: Hannah Beat MD,  February 27, 2010 4:58 PM

## 2010-10-09 NOTE — Assessment & Plan Note (Signed)
Summary: FU Wendy Love   #   Vital Signs:  Patient profile:   70 year old female Height:      65 inches Weight:      134 pounds BMI:     22.38 O2 Sat:      98 % on Room air Temp:     97.4 degrees F oral Pulse rate:   79 / minute BP sitting:   172 / 98  (left arm) Cuff size:   regular  Vitals Entered By: Bill Salinas CMA (March 21, 2010 11:03 AM)  O2 Flow:  Room air  Primary Care Provider:  [name]   History of Present Illness: Patient presents for the sole purpose of reviewing labs, x-rays, DXA scan and to discuss her on-going cough.  She has had a persistent cough for many weeks. She did see an ENT specialist: sinus imaging was negative. She was advised that a 1 weeks holiday from ACE-I wasn't long enough to r/o ACE-I cough. She denies fevers, chills, weight change. She denies post-nasal drainage although her cough does intensify in certain environments.   X-ray report and image reviewed with patient: flattened diaphragm and hyperinflation suggesting COPD.  Labs: Na 131, K 5.3, chloride slightly low.           Lipid panel reviewed: total 280, HDL 115, LDL 156, Cho/HDL-2.3-protective, LDL/HDL 1.3 very protective. NIH/Framingham risk calculation based on age, gender, SBP and HDL = 7%, LOW RISK  DXA- Right femur neck -0.6, Left femur neck -0.7 - normal           L-S spine 1.7 but Dr. Alwyn Ren (reader) felt scoliosis gave falsely elevated T-score.  Current Medications (verified): 1)  Hydrochlorothiazide 25 Mg  Tabs (Hydrochlorothiazide) .... Take 1 Tablet By Mouth Once A Day 2)  Levothyroxine Sodium 25 Mcg  Tabs (Levothyroxine Sodium) .... Take 1 Tablet By Mouth Once A Day 3)  Ibuprofen 600 Mg Tabs (Ibuprofen) .[name].. 1 By Mouth Three Times A Day 4)  Lisinopril 20 Mg Tabs (Lisinopril) .[name].. 1 By Mouth Two Times A Day 5)  Triamcinolone Acetonide 0.1 % Oint (Triamcinolone Acetonide) .... 2x A Day 6)  Aspirin Low Dose 81 Mg Tabs (Aspirin) .[name].. 1 Once Daily 7)  Temazepam 15 Mg Caps  (Temazepam) .... One Tablet At Bedtime  Allergies (verified): 1)  ! Codeine 2)  * Int. Atenolol  Past History:  Past Medical History: Last updated: 03/22/2009 Hypertension Hyperthyroidism Depression carpal tunnel anxiety myalgias sciatica  Past Surgical History: Last updated: 03/22/2009 Hysterectomy Stress Cardiolite (10/17/2005) Carpal tunnel release: Right - Nov '09; Left - Jan '10 ( Dr. Teressa Senter) L3-4 hemilaminectomy, L4-5 micro-diskectomy April '10 (Dr. Rise Mu) [facility] reviewed for relevance, FH reviewed for relevance  Review of Systems       The patient complains of hoarseness, dyspnea on exertion, and prolonged cough.  The patient denies anorexia, fever, weight loss, weight gain, decreased hearing, chest pain, syncope, abdominal pain, severe indigestion/heartburn, incontinence, suspicious skin lesions, depression, abnormal bleeding, and angioedema.    Physical Exam  General:  WNWD white female in no distress Head:  normocephalic and atraumatic.   Eyes:  pupils equal and pupils round.   Lungs:  normal respiratory effort and normal breath sounds.   Heart:  normal rate and regular rhythm.   Neurologic:  alert & oriented X3, cranial nerves II-XII intact, and gait normal.   Skin:  turgor normal and color normal.   Psych:  Oriented X3, memory intact for recent and remote,  normally interactive, good eye contact, and not anxious appearing.     Impression & Recommendations:  Problem # 1:  CHEST XRAY, ABNORMAL (ICD-793.1) Patient with chest x-ray read out as mild COPD. She is very concerned  Plan - full PFT's for functional diagnosis  Problem # 2:  COUGH (ICD-786.2) Persistent cough. CXR without infiltrate, lesion, effusions. Potential causes: ACE-I, reflux-silent, allergy.  Plan - stop ACE-I, if cough persists           PPI therapy, if cough persists           treat with nasal steroids and refer to Pulmonary (may need bronchoscopy)  Problem # 3:  HYPERTENSION  (ICD-401.9) Patient with variable control. Also, going for ACE-I holiday  Plan - trial of Valturna 300/160 - 3 weeks samples           hold HCTZ  Her updated medication list for this problem includes:    Hydrochlorothiazide 25 Mg Tabs (Hydrochlorothiazide) .[name]... Take 1 tablet by mouth once a day    Lisinopril 20 Mg Tabs (Lisinopril) .[name]... 1 by mouth two times a day  Problem # 4:  Preventive Health Care (ICD-V70.0) Reviewed lipid panel - given the very high HDL, the LDL at less than 725 in low cardiac risk (7%) patient will follow prudent diet but no indication for medication.  Reviewed DXA - normal. No need for treatment. Dr. Alwyn Ren recommends forearm density at next study.   (greater than 50% of 30 minute visit on education and counseling.)  Complete Medication List: 1)  Hydrochlorothiazide 25 Mg Tabs (Hydrochlorothiazide) .... Take 1 tablet by mouth once a day 2)  Levothyroxine Sodium 25 Mcg Tabs (Levothyroxine sodium) .... Take 1 tablet by mouth once a day 3)  Ibuprofen 600 Mg Tabs (Ibuprofen) .[name].. 1 by mouth three times a day 4)  Lisinopril 20 Mg Tabs (Lisinopril) .[name].. 1 by mouth two times a day 5)  Triamcinolone Acetonide 0.1 % Oint (Triamcinolone acetonide) .... 2x a day 6)  Aspirin Low Dose 81 Mg Tabs (Aspirin) .[name].. 1 once daily 7)  Temazepam 15 Mg Caps (Temazepam) .... One tablet at bedtime  Other Orders: Pulmonary Referral (Pulmonary)

## 2010-10-09 NOTE — Progress Notes (Signed)
    Immunization History:  Zostavax History:    Zostavax # 1:  zostavax (05/28/2010)

## 2010-10-09 NOTE — Progress Notes (Signed)
Summary: RESULTS  Phone Note Call from Patient Call back at [facility] Phone [phone]   Summary of Call: Pt was give samples of bp med. After 6 days, bp was "very high" 183/112, 209/118, 161/96, 151/95, 179/112, 167/109. Continue current samples or resume old med or change to new med? She has noticed no difference in cough.   Also would like results of urine test.   Initial call taken by: Lamar Sprinkles, CMA,  December 25, 2009 5:56 PM  Follow-up for Phone Call        With no difference in cough she should resume prior medications.   24 hr urine studies - normal. No evidence of abnormal adrenal gland function.  Follow-up by: [name],  December 26, 2009 9:19 AM  Additional Follow-up for Phone Call Additional follow up Details #1::        Pt informed  Additional Follow-up by: Lamar Sprinkles, CMA,  December 26, 2009 10:25 AM    New/Updated Medications: LISINOPRIL 40 MG TABS (LISINOPRIL) 1 once daily

## 2010-10-09 NOTE — Assessment & Plan Note (Signed)
Summary: EVAL OF SHORT LEG SYNDROME FOLLOWING BACK SURGERY   Vital Signs:  Patient profile:   70 year old female Height:      65 inches Weight:      136.0 pounds BMI:     22.71 Temp:     98.0 degrees F oral Pulse rate:   64 / minute Pulse rhythm:   regular BP sitting:   140 / 90  (left arm) Cuff size:   regular  Vitals Entered By: Benny Lennert CMA Duncan Dull) (February 07, 2010 11:10 AM)  History of Present Illness: Chief complaint evalution of short leg syndrome following back surgery  70 year old very pleasant lady seen at the request of Dr. Debby Bud for leg length inequality s/p lumbar surgery in 12/2008, now with notable R < L leg length inequality and continued pain s/p operative intervention.  12/09 - onset acutely R LBP and R hip pain 12/09 - 10, s/p manipulation by Dr. Jonnie Finner, traction, rehab, acupuncture 10/2008: PT, [facility] 12/2008: R L 3-4 hemilaminectomy, 4-5 microdiskectomy 6-03/2009, s/p post op rehab 04/2009, s/p epidural steroid injections x 2  Working out several times a week at exercise classes at [facility] Now has limitations, some essentially constant pain, R sided buttocks mostly with radiculopathy.  Take some temazepam at night.  Does not want to be on chronic pain medication Also has tried Flector patches, lidocaine patches in the past without success.  Now is using a horse linament that lasts for a fairly long time and is helping symptomatically.  R leg - 1 1/2 cm shorter   Bosu ball exercises - has at home, not sure what to do.  Allergies: 1)  ! Codeine 2)  * Int. Atenolol  Past History:  Past medical, surgical, family and social histories (including risk factors) reviewed, and no changes noted (except as noted below).  Past Medical History: Reviewed history from 03/22/2009 and no changes required. Hypertension Hyperthyroidism Depression carpal tunnel anxiety myalgias sciatica  Past Surgical History: Reviewed history from 03/22/2009 and no changes  required. Hysterectomy Stress Cardiolite (10/17/2005) Carpal tunnel release: Right - Nov '09; Left - Jan '10 ( Dr. Teressa Senter) L3-4 hemilaminectomy, L4-5 micro-diskectomy April '10 (Dr. Rise Mu)  Family History: Reviewed history from Jan 20, 2009 and no changes required. father - deceased @ 39: CAD/MI, CVA, HTN, Lipids, DM mother - 44: ovarian Ca, HTN, Lipids Son with bladder cancer with mets Neg- breast or colon cancer   Social History: Reviewed history from 12/19/2009 and no changes required. Mother deceased @ 75: had ovarian cancer, died of PNA 09-24-2022 21-Jan-2023) [employer] finished at [facility] [employer] [facility] married - '61 - 19yrs/divorced; married '82 step - son work: full time homemaker end-of-life issues: no futile or heroic care, full palliative care if needed. Does want attempt at resuscitation (May '10)  Review of Systems      See HPI       as above, some back and buttocks pain with radiculopathy. tol by mouth easily. General:  Denies chills and fever. MS:  Complains of low back pain, mid back pain, muscle, cramps, stiffness, and thoracic pain. Neuro:  See HPI.  Physical Exam  General:  Well-developed,well-nourished,in no acute distress; alert,appropriate and cooperative throughout examination Head:  Normocephalic and atraumatic without obvious abnormalities. No apparent alopecia or balding. Ears:  no external deformities.   Nose:  no external deformity.   Additional Exam:  Paraspinus T: Y, mild at the lumbar paraspinus region  Flexion, ext, rotation at the waist is approaching normal   B  FABER: neg B Reverse FABER: neg B Greater Troch: NT B Log Roll: neg B Stork: NT B Sciatic Notch: NT - notably NT TTP in buttocks, piriformis, quadratus  HIP EXAM: SIDE: ROM: Abduction, Flexion, Internal and External range of motion: good Pain with terminal IROM and EROM: no Knees: No effusion FABER: NT REVERSE FABER: mild tenderness Piriformis: NT at direct palpation Str: flexion: 5/5 abduction:  5/5 adduction: 5/5  Leg lengths: 1 1/2 cm shorter on the R   Impression & Recommendations:  Problem # 1:  SCIATICA, RIGHT (ICD-724.3) The patient is quite functional, active and working out several days a week. Doing low impact at [facility] Strongly encouraged to do pool aerobics and start swimming  Core is relatively strong Pt. has bosu ball, and I reviewed some more str and rom work that she can do and encorporate into her workouts along with what she did with PT  R leg 1 1/2 cm shorter.   I cautioned her to get a second opinion or even 3rd if large scale fusion entertained. Some symptoms, but functional patient.  cc: Dr. Debby Bud  Her updated medication list for this problem includes:    Ibuprofen 600 Mg Tabs (Ibuprofen) .[name]... 1 by mouth three times a day    Aspirin Low Dose 81 Mg Tabs (Aspirin) .[name]... 1 once daily  Problem # 2:  UNEQUAL LEG LENGTH (ICD-736.81) Reasonable to try to adjust - has some orthotics, 3/4 length.  Placed a 4 mm lift onto orthosis. Generally, full correction tough for patients to accept, and would do part now, then add additional later with harder EVA or cork  Complete Medication List: 1)  Hydrochlorothiazide 25 Mg Tabs (Hydrochlorothiazide) .... Take 1 tablet by mouth once a day 2)  Levothyroxine Sodium 25 Mcg Tabs (Levothyroxine sodium) .... Take 1 tablet by mouth once a day 3)  Ibuprofen 600 Mg Tabs (Ibuprofen) .[name].. 1 by mouth three times a day 4)  Lisinopril 20 Mg Tabs (Lisinopril) .[name].. 1 by mouth two times a day 5)  Triamcinolone Acetonide 0.1 % Oint (Triamcinolone acetonide) .... 2x a day 6)  Aspirin Low Dose 81 Mg Tabs (Aspirin) .[name].. 1 once daily 7)  Temazepam 15 Mg Caps (Temazepam) .... One tablet at bedtime  Patient Instructions: 1)  f/u 4-6 weeks  Current Allergies (reviewed today): ! CODEINE * INT. ATENOLOL

## 2010-10-09 NOTE — Progress Notes (Signed)
  Phone Note Refill Request Message from:  Fax from Pharmacy on November 24, 2009 10:22 AM  Refills Requested: Medication #1:  TEMAZEPAM 15 MG CAPS 1 by mouth at bedtime for sleep please Advise refill  Initial call taken by: Ami Bullins CMA,  November 24, 2009 10:22 AM  Follow-up for Phone Call        ok x 5 Follow-up by: [name],  November 24, 2009 1:04 PM    Prescriptions: TEMAZEPAM 15 MG CAPS (TEMAZEPAM) 1 by mouth at bedtime for sleep  #90 x 1   Entered by:   Ami Bullins CMA   Authorized by:   [name]   Signed by:   Bill Salinas CMA on 11/24/2009   Method used:   Telephoned to ...       [facility] [address] (retail)       [address]       [place], [place]  [address]       Ph: [phone]       Fax: [phone]   RxID:   [id number] TEMAZEPAM 15 MG CAPS (TEMAZEPAM) 1 by mouth at bedtime for sleep  #90 x 1   Entered by:   Ami Bullins CMA   Authorized by:   [name]   Signed by:   Bill Salinas CMA on 11/24/2009   Method used:   Telephoned to ...       [facility] [address] (retail)       [address]       [place], [place]  [address]       Ph: [phone]       Fax: [phone]   RxID:   [id number]

## 2010-10-09 NOTE — Progress Notes (Signed)
Summary: opinion  Phone Note Call from Patient Call back at Home Phone [phone]   Summary of Call: Patient left message on triage that her best friend of many years is moving back to town. Her friend is a cancer survivor and needs an excellent vascular MD, the patient would like to know who you recommend. She honors/values your opinion. Please advise. Initial call taken by: Lucious Groves,  November 21, 2009 11:44 AM  Follow-up for Phone Call        Dr. Lowella Fairy Follow-up by: [name],  November 21, 2009 12:43 PM  Additional Follow-up for Phone Call Additional follow up Details #1::        lmoam Additional Follow-up by: Ami Bullins CMA,  November 21, 2009 1:38 PM    Additional Follow-up for Phone Call Additional follow up Details #2::    Pt informed  Follow-up by: Lamar Sprinkles, CMA,  November 21, 2009 5:30 PM

## 2010-10-09 NOTE — Progress Notes (Signed)
Summary: BP MED  Phone Note Call from Patient   Summary of Call: Pt began Valturna on Friday, once daily in the am. Per pt, readings are higher than when on previous med. Only one "normal" reading. Highest being 182/113.  C/o dizzyness, h/a, edema in left hand & ankle. Cough & hoarsness is NOT gone, remains the same. If MD wants pt to have different med please advise, otherwise she will resume lisinopril/hctz. She has pulmonary apt next monday. Initial call taken by: Lamar Sprinkles, CMA,  March 26, 2010 10:41 AM  Follow-up for Phone Call        OK to resume lisinopril.HCTZ Follow-up by: [name],  March 26, 2010 11:59 AM  Additional Follow-up for Phone Call Additional follow up Details #1::        left detailed vm for pt on hm # Additional Follow-up by: Lamar Sprinkles, CMA,  March 26, 2010 1:43 PM

## 2010-10-09 NOTE — Assessment & Plan Note (Signed)
Summary: COUGH/ LUMP IN GROIN/NWS   Vital Signs:  Patient profile:   70 year old female Height:      65 inches (165.10 cm) Weight:      137 pounds (62.27 kg) BMI:     22.88 O2 Sat:      98 % on Room air Temp:     98.2 degrees F (36.78 degrees C) oral Pulse rate:   84 / minute Pulse rhythm:   regular BP sitting:   144 / 98  (left arm) Cuff size:   regular  Vitals Entered By: Brenton Grills (Jan 17, 2010 2:12 PM)  O2 Flow:  Room air CC: pt c/o hacking cough for about 8 weeks, tightness in chest/also has lump in left groin/aj   Primary Care Provider:  [name]  CC:  pt c/o hacking cough for about 8 weeks and tightness in chest/also has lump in left groin/aj.  History of Present Illness: Presents with many issues: 1. tremor in the left hand-most of the time. Fine tremor at rest. 2. Weakness in the left arm-she is dropping items. The arm feels heavy. She will have paresthesia. 3. Wants to wean off of temazepam 4. Cough - hacking dry cough. She does take lisinopril. No sputum production. No SOB/DOE. Cough is worse when lying down. 5. Flushing, rapid heart rate and sweating which comes on suddenly and only lasts a short period of time. Does admit to fight or flight sensation. 6. BP - home record reveals that she runs between 130's and 140s with occasional excursions. Better than the very high values she has run in the past.  7. Lump in the left groin. 8. Mole on the right calf. 9. on -going leg pain.   Current Medications (verified): 1)  Hydrochlorothiazide 25 Mg  Tabs (Hydrochlorothiazide) .... Take 1 Tablet By Mouth Once A Day 2)  Levothyroxine Sodium 25 Mcg  Tabs (Levothyroxine Sodium) .... Take 1 Tablet By Mouth Once A Day 3)  Ibuprofen 600 Mg Tabs (Ibuprofen) .[name].. 1 By Mouth Three Times A Day 4)  Lisinopril 40 Mg Tabs (Lisinopril) .[name].. 1 By Mouth Once Daily 5)  Temazepam 15 Mg Caps (Temazepam) .[name].. 1 By Mouth At Bedtime For Sleep 6)  Triamcinolone Acetonide 0.1  % Oint (Triamcinolone Acetonide) .... 2x A Day  Allergies (verified): 1)  ! Codeine 2)  * Int. Atenolol  Past History:  Past Medical History: Last updated: 03/22/2009 Hypertension Hyperthyroidism Depression carpal tunnel anxiety myalgias sciatica  Past Surgical History: Last updated: 03/22/2009 Hysterectomy Stress Cardiolite (10/17/2005) Carpal tunnel release: Right - Nov '09; Left - Jan '10 ( Dr. Teressa Senter) L3-4 hemilaminectomy, L4-5 micro-diskectomy April '10 (Dr. Rise Mu)  Family History: Last updated: February 15, 2009 father - deceased @ 39: CAD/MI, CVA, HTN, Lipids, DM mother - 13: ovarian Ca, HTN, Lipids Son with bladder cancer with mets Neg- breast or colon cancer   Social History: Last updated: Jan 17, 2010 Mother deceased @ 40: had ovarian cancer, died of PNA 10-20-22 Feb 16, 2023) [employer] finished at [facility] married - '61 - 24yrs/divorced; married '82 step - son work: full time homemaker end-of-life issues: no futile or heroic care, full palliative care if needed. 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 Does want attempt at resuscitation (May '10)  Review of Systems  The patient denies anorexia, fever, decreased hearing, hoarseness, syncope, dyspnea on exertion, prolonged cough, abdominal pain, hematuria, muscle weakness, difficulty walking, depression, and enlarged lymph nodes.    Physical Exam  General:  alert, well-developed, well-nourished, and well-hydrated.   Head:  normocephalic  and atraumatic.   Eyes:  pupils equal, pupils round, corneas and lenses clear, and no injection.   Neck:  supple and full ROM.   Lungs:  normal respiratory effort, normal breath sounds, no crackles, and no wheezes.   Heart:  normal rate and regular rhythm.   Abdomen:  soft, non-tender, and normal bowel sounds.   Msk:  no joint tenderness and no joint warmth.   Pulses:  2+ radial Neurologic:  alert & oriented X3, cranial nerves II-XII intact, and gait normal.   Skin:  turgor normal and color normal.  Pea sized nodule medial proximal left LE, firm, mobile, not tender Psych:  Oriented X3, memory intact for recent and remote, and normally interactive.     Impression & Recommendations:  Problem # 1:  TREMOR (ICD-781.0) patient with a fine resting tremor left hand. Non-focal exam otherwise. She does feel a heaviness in her left arm but has normal strength. We discussed possible etiologies including DDD cervical spine of a minor nature vs CNS event vs benign essential tremor. She is concerned it is the temazepam and wishes to defer any imaging studies.  Plan start low dose ASA daily  Problem # 2:  FLUSHING (ZOX-096.04) Patient with a panolpy of symptoms that may be catecholamine release  Plan - 24 hr urine studies to r/o pheo  Orders: T-Urine 24 Hr. Catecholamines [phone]) T-Urine 24 Hr. Metanephrines [phone])  Problem # 3:  HYPERTENSION (ICD-401.9) Patient does have a cough and is concerned that this may be related to ACE-I. BP is well controlled.  Plan - hold lsinopril, substituting diovan. If cough resolves will start cozaar. If cough persists resume lisinopril  Her updated medication list for this problem includes:    Hydrochlorothiazide 25 Mg Tabs (Hydrochlorothiazide) .[name]... Take 1 tablet by mouth once a day    Diovan 160 Mg Tabs (Valsartan) .[name]... 1 by mouth once daily  Problem # 4:  CYST, VULVA (ICD-624.8) benign cyst that seems to be an subdermal structure more in the  groin then the vulva.  Plan - watchfull waiting.   Problem # 5:  INSOMNIA, CHRONIC (ICD-307.42) OKI to wean off of temazepam.  Complete Medication List: 1)  Hydrochlorothiazide 25 Mg Tabs (Hydrochlorothiazide) .... Take 1 tablet by mouth once a day 2)  Levothyroxine Sodium 25 Mcg Tabs (Levothyroxine sodium) .... Take 1 tablet by mouth once a day 3)  Ibuprofen 600 Mg Tabs (Ibuprofen) .[name].. 1 by mouth three times a day 4)  Diovan 160 Mg Tabs (Valsartan) .[name].. 1 by mouth once daily 5)  Triamcinolone Acetonide 0.1 % Oint (Triamcinolone acetonide) .... 2x a day 6)  Aspirin Low Dose 81 Mg Tabs (Aspirin) .[name].. 1 once daily  Patient Instructions: 1)  tremor - minor and unlikely to be a significant neurologic problem. But the best test would be an MRI. Plan - stop temazepam. Start low dose aspirin once a day. If the symptoms get worse we can move ahead with imaging 2)  BP- will stop lisinopril and substitute diovan 160mg  once  a day. If the cought doesn't go away will return to lisinopril. The BP is doing OK on the present regimen 3)  Temazepam - shold be able to stop without difficulty: go to every other night x 1 week, every 3rd night for 1 week or so then stop 4)  Cough - see BP above 5)  Left arm weakness - see tremor above. 6)  nodule in left groin - feels like a benigh dermal cyst. Watch and wait 7)  Mole on leg - appears benign.  8)  Flushing etc - will check for adrenal gland issues-24 hour urine for metanephrines and catecholamines = adrenaline like products.  Prescriptions: DIOVAN 160 MG TABS (VALSARTAN) 1 by mouth once daily  #30 x 0   Entered and Authorized by:   [name]   Signed by:   [name] on 12/20/2009   Method used:   Electronically to        [facility]  [address] (retail)       [address], [address]       [place], [place]  [address]       Ph: [phone]       Fax: [phone]   RxID:    [id number]    Preventive Care Screening  Mammogram:    Date:  07/10/2009    Results:  normal

## 2010-10-09 NOTE — Assessment & Plan Note (Signed)
Summary: allergy eval persistant cough/cb   Visit Type:  Initial Consult Copy to:  [name] Primary Provider/Referring Provider:  [name]  CC:  [facility] consult, pt c/o hoarseness, non productive cough, chest tightness, all this been going on since january, and pt would like to also discuss her radiology report that was about her lungs.  History of Present Illness: May 10, 2010- 70 yoF seen in pulmonary/ allergy consultation at kind request of Dr Debby Bud .Records reviewed. She denies past hx of significant allergy or respiratory disease. In January of 2011 she helped clear the house of her late mother. She started wearing a mask because of significant dust and mildew. She notices the same odor as she continues [place] through boxes she brought home with her. Since January has noted persistent hoarseness, hacking dry cough, heavy aching in upper anterior chest, some exertional dyspnea if she pushes unusually hard.  Still going to exercise class twice weekly- aerobic and weights. Coughing some clear mucus, some postnasal drip, tinnitus, dark circles nder eyres, frontl headaches. Using saline nasal spray. In Fall of 2010 took steroids for urticaria. Treated twice with steroids last year for arthritc pains. . Tried off lisinopril x 2 weeks, but BP rose without effect on cough, so she resumed lisinopril. She says benadryl and all antihistamines tried will overstimulate her. .  She specifically wants allergy testing. Intubated x 3 in 2010 for surgeries, leaving her hoarse. PFT 04/03/10- mild obstruction, normal DLCO. Lung volumes not valid. Dilator not done. FEV1 1.92/ 94%, FEV1/FVC 0.75, 25-65/ 63%. CXR 01/29/10 "suspicious for COPD".  Preventive Screening-Counseling & Management  Alcohol-Tobacco     Smoking Status: quit     Packs/Day: .5     Year Started: 1980     Year Quit: 1982  Current Medications (verified): 1)  Hydrochlorothiazide 25 Mg  Tabs (Hydrochlorothiazide)  .... Take 1 Tablet By Mouth Once A Day 2)  Levothyroxine Sodium 25 Mcg  Tabs (Levothyroxine Sodium) .... Take 1 Tablet By Mouth Once A Day 3)  Ibuprofen 600 Mg Tabs (Ibuprofen) .[name].. 1 By Mouth Three Times A Day 4)  Lisinopril 20 Mg Tabs (Lisinopril) .[name].. 1 By Mouth Two Times A Day 5)  Triamcinolone Acetonide 0.1 % Oint (Triamcinolone Acetonide) .... 2x A Day 6)  Aspirin Low Dose 81 Mg Tabs (Aspirin) .[name].. 1 Once Daily 7)  Temazepam 15 Mg Caps (Temazepam) .... One Tablet At Bedtime 8)  Fish Oil 1000 Mg Caps (Omega-3 Fatty Acids) .... Two Times A Day 9)  Coq10 100 Mg Caps (Coenzyme Q10) .... Once Daily 10)  Vitamin C 500 Mg Tabs (Ascorbic Acid) .... Once Daily 11)  B Complex 50  Tabs (B Complex Vitamins) .... Once Daily 12)  Vitamin D3 2000 Unit Caps (Cholecalciferol) .... Once Daily 13)  Zinc 25 Mg Tabs (Zinc) .... Once Daily 14)  Calcium-Magnesium 500-250 Mg Tabs (Calcium-Magnesium) .... Two Times A Day 15)  Probiotic  Caps (Probiotic Product) .... Once Daily  Allergies (verified): 1)  ! Codeine 2)  ! * Zytec 3)  ! Claritin 4)  ! Benadryl 5)  ! * Lodrane 6)  * Int. Atenolol  Past History:  Past Medical History: Hypertension Hyperthyroidism Depression carpal tunnel anxiety myalgias sciatica rhinosinusitis Bronchitis Back pain  Past Surgical History: Hysterectomy Stress Cardiolite (10/17/2005) Carpal tunnel release: Right - Nov '09; Left - Jan '10 ( Dr. Teressa Senter) L3-4 hemilaminectomy, L4-5 micro-diskectomy April '10 (Dr. Rise Mu) Appendectomy  Family History: father - deceased @ 84:  CAD/MI, CVA, HTN, Lipids, DM mother - 56: died- ovarian Ca, HTN, Lipids Son with bladder cancer with mets Neg- breast or colon cancer   Social History: Mother deceased @ 52: had ovarian cancer, died of PNA 2022-10-17 02-14-2023) [employer] finished at [facility] married - '61 - 39yrs/divorced; married '82 step - son work: full time homemaker end-of-life issues: no futile or heroic care, full palliative care  if needed. Does want attempt at resuscitation (May '10) Patient states formers smoker, Started in 1980 quit in 1982.  Smoking Status:  quit Packs/Day:  .5  Review of Systems       The patient complains of shortness of breath with activity, non-productive cough, chest pain, sore throat, headaches, and joint stiffness or pain.  The patient denies shortness of breath at rest, productive cough, coughing up blood, irregular heartbeats, acid heartburn, indigestion, loss of appetite, weight change, abdominal pain, difficulty swallowing, tooth/dental problems, nasal congestion/difficulty breathing through nose, sneezing, itching, ear ache, anxiety, depression, hand/feet swelling, rash, change in color of mucus, and fever.         Sciatica and low back pain  Vital Signs:  Patient profile:   70 year old female Height:      65 inches Weight:      136.6 pounds O2 Sat:      100 % on Room air Pulse rate:   79 / minute Cuff size:   regular  Vitals Entered By: Reynaldo Minium CMA (May 10, 2010 10:49 AM)  O2 Flow:  Room air CC: [facility] consult, pt c/o hoarseness, non productive cough, chest tightness, all this been going on since October 17, 2022, pt would like to also discuss her radiology report that was about her lungs Comments meds and allergies updated Reynaldo Minium CMA  May 10, 2010 11:06 AM    Physical Exam  Additional Exam:  General: A/Ox3; pleasant and cooperative, NAD, WDWN SKIN: no rash, lesions NODES: no lymphadenopathy HEENT: [place]/AT, EOM- WNL, Conjuctivae- clear, PERRLA, TM-WNL, Nose- clear, Throat- clear and wnl, Mallampati  II, no drip or erythema NECK: Supple w/ fair ROM, JVD- none, normal carotid impulses w/o bruits Thyroid- normal to palpation, a little hoarse, no stridor CHEST: Clear to P&A HEART: RRR, no m/g/r heard ABDOMEN: Soft and nl; nml bowel sounds; no organomegaly or masses noted [id number], nl pulses, no edema  NEURO: Grossly intact to  observation      Impression & Recommendations:  Problem # 1:  ? of ALLERGIC RHINITIS CAUSE UNSPECIFIED (ICD-477.9)  Her specific question is whether the frontal headache, hoarseness and dry cough may all relate to exposures from clearing out her mother's house and ongoing sorting through  boxes from that house. We discussed lisinopril , her remote eposure to tobacco- mostly passive, and uncertain amount of GERD. We will repeat PFT for another try at measuing lung volumes and to get B&A dilator. We wil get 6 MWT because of her reported exertional dypnea .We will evaluate IgE activation and bring her back for skin testing She may need a limited CT sinus to look for sinusitis.  Orders: Consultation Level IV (29562) T-Allergy Profile [place], [place], [place], [place], [place] [phone])  Medications Added to Medication List This Visit: 1)  Fish Oil 1000 Mg Caps (Omega-3 fatty acids) .... Two times a day 2)  Coq10 100 Mg Caps (Coenzyme q10) .... Once daily 3)  Vitamin C 500 Mg Tabs (Ascorbic acid) .... Once daily 4)  B Complex 50 Tabs (B complex vitamins) .... Once daily 5)  Vitamin D3 2000 Unit Caps (Cholecalciferol) .... Once daily 6)  Zinc 25 Mg Tabs (Zinc) .... Once daily 7)  Calcium-magnesium 500-250 Mg Tabs (Calcium-magnesium) .... Two times a day 8)  Probiotic Caps (Probiotic product) .... Once daily  Other Orders: Full Pulmonary Function Test (PFT)  Patient Instructions: 1)  Return as able for allergy skin testing. Stop all antihistamines 3 days before skin testing, including cold and allergy meds, otc sleep and cough meds.  2)  See [facility] to schedule PFT with 6 minute walk test 3)  Lab

## 2010-10-09 NOTE — Assessment & Plan Note (Signed)
Summary: ALLERGY TESTING ///kp   Vital Signs:  Patient profile:   70 year old female Height:      65 inches Weight:      136.13 pounds BMI:     22.74 O2 Sat:      96 % on Room air Pulse rate:   94 / minute BP sitting:   120 / 78  (left arm) Cuff size:   regular  Vitals Entered By: Reynaldo Minium CMA (May 25, 2010 1:37 PM)  O2 Flow:  Room air CC: Allergy Skin Testing, Hypertension Management   Copy to:  [name] Primary Provider/Referring Provider:  [name]  CC:  Allergy Skin Testing and Hypertension Management.  History of Present Illness:  History of Present Illness: May 10, 2010- 68 yoF seen in pulmonary/ allergy consultation at kind request of Dr Debby Bud .Records reviewed. She denies past hx of significant allergy or respiratory disease. In January of 2011 she helped clear the house of her late mother. She started wearing a mask because of significant dust and mildew. She notices the same odor as she continues [place] through boxes she brought home with her. Since January has noted persistent hoarseness, hacking dry cough, heavy aching in upper anterior chest, some exertional dyspnea if she pushes unusually hard.  Still going to exercise class twice weekly- aerobic and weights. Coughing some clear mucus, some postnasal drip, tinnitus, dark circles under eyres, frontl headaches. Using saline nasal spray. In Fall of 2010 took steroids for urticaria. Treated twice with steroids last year for arthritc pains. . Tried off lisinopril x 2 weeks, but BP rose without effect on cough, so she resumed lisinopril. She says benadryl and all antihistamines tried will overstimulate her. .  She specifically wants allergy testing. Intubated x 3 in 2010 for surgeries, leaving her hoarse. PFT 04/03/10- mild obstruction, normal DLCO. Lung volumes not valid. Dilator not done. FEV1 1.92/ 94%, FEV1/FVC 0.75, 25-65/ 63%. CXR 01/29/10 "suspicious for COPD".  May 25, 2010-  Cough No change in her cough, interferes with singing at church. More sinus congestion but nose and chest not productive, no fever and no chest pain. Denies wheeze. Persistent heavy soreness on right upper anterior chest. Had an episode of food hangup last night- finally went down with water. Not recognizing reflux or heartburn.  IgE 4.6, Allergy profile NEG Skin test- Mild positives for some grass, tree, weed and dust antigens, PFT- complete- mild obstructive disease in small airways, no resp to BD, DLCO mildly reduced. Lung volumes normal. FEV1 1.97/ 92%, R 0.71.DLCO .69 6 MWT- 100%, 100%, 100%, 492 meters.     Hypertension History:      Positive major cardiovascular risk factors include female age 74 years old or older, hyperlipidemia, and hypertension.  Negative major cardiovascular risk factors include non-tobacco-user status.        Positive history for target organ damage include ASHD (either angina/prior MI/prior CABG).    Preventive Screening-Counseling & Management  Alcohol-Tobacco     Alcohol drinks/day: 0     Smoking Status: quit     Packs/Day: .5     Year Started: 1980     Year Quit: 1982     Passive Smoke Exposure: no  Current Medications (verified): 1)  Hydrochlorothiazide 25 Mg  Tabs (Hydrochlorothiazide) .... Take 1 Tablet By Mouth Once A Day 2)  Levothyroxine Sodium 25 Mcg  Tabs (Levothyroxine Sodium) .... Take 1 Tablet By Mouth Once A Day 3)  Ibuprofen 600 Mg  Tabs (Ibuprofen) .[name].. 1 By Mouth Three Times A Day 4)  Lisinopril 20 Mg Tabs (Lisinopril) .[name].. 1 By Mouth Two Times A Day 5)  Triamcinolone Acetonide 0.1 % Oint (Triamcinolone Acetonide) .... 2x A Day 6)  Aspirin Low Dose 81 Mg Tabs (Aspirin) .[name].. 1 Once Daily 7)  Temazepam 15 Mg Caps (Temazepam) .... One Tablet At Bedtime 8)  Fish Oil 1000 Mg Caps (Omega-3 Fatty Acids) .... Two Times A Day 9)  Coq10 100 Mg Caps (Coenzyme Q10) .... Once Daily 10)  Vitamin C 500 Mg Tabs (Ascorbic Acid) .... Once Daily 11)  B  Complex 50  Tabs (B Complex Vitamins) .... Once Daily 12)  Vitamin D3 2000 Unit Caps (Cholecalciferol) .... Once Daily 13)  Zinc 25 Mg Tabs (Zinc) .... Once Daily 14)  Calcium-Magnesium 500-250 Mg Tabs (Calcium-Magnesium) .... Two Times A Day 15)  Probiotic  Caps (Probiotic Product) .... Once Daily 16)  Zostavax [address] Unt/0.81ml Solr (Zoster Vaccine Live) .... As Directed  Allergies (verified): 1)  ! Codeine 2)  ! * Zytec 3)  ! Claritin 4)  ! Benadryl 5)  ! * Lodrane 6)  * Int. Atenolol  Past History:  Past Medical History: Hypertension Hyperthyroidism Depression carpal tunnel anxiety myalgias sciatica rhinosinusitis Bronchitis- PFT 05/11/10- FEV1 1.97/ 92%, FEV1/FVC 0.71, 25-75 62%, no resp to BD, DLCO .69.                  - Allergy skin test Mild pos. 05/25/10                   - IgE 4.6, Neg allergy IgE profile Back pain  Review of Systems      See HPI       The patient complains of shortness of breath with activity and non-productive cough.  The patient denies shortness of breath at rest, productive cough, coughing up blood, chest pain, irregular heartbeats, acid heartburn, indigestion, loss of appetite, weight change, abdominal pain, difficulty swallowing, sore throat, tooth/dental problems, headaches, nasal congestion/difficulty breathing through nose, and sneezing.    Physical Exam  Additional Exam:  General: A/Ox3; pleasant and cooperative, NAD, WDWN SKIN: no rash, lesions NODES: no lymphadenopathy HEENT: [place]/AT, EOM- WNL, Conjuctivae- clear, PERRLA, TM-WNL, Nose- clear, Throat- clear and wnl, Mallampati  II, no drip or erythema NECK: Supple w/ fair ROM, JVD- none, normal carotid impulses w/o bruits Thyroid- normal to palpation, , no stridor CHEST: Clear to P&A HEART: RRR, no m/g/r heard ABDOMEN:not obese UEA:VWUJ, nl pulses, no edema  NEURO: Grossly intact to observation      Impression & Recommendations:  Problem # 1:  ? of ALLERGIC RHINITIS CAUSE  UNSPECIFIED (ICD-477.9)  Mild panseasonal skin test responses may correspond to some stuffiness, drainage and maybe cough.  Try Naslacrom She gets mood changes with antihistamines. Her updated medication list for this problem includes:    Nasalcrom 5.2 Mg/act Aers (Cromolyn sodium) .[name]... 1-2 sprays each nostril four times a day  Problem # 2:  DYSPNEA (ICD-786.05)  Cough, dyspnea and pressure discomfort right upper anterior chest may reflect some esophagitis. I will ask her to try a maintenance acid blocker. Doubt angina. We have free est kits and will screen for A1AT def.  Problem # 3:  ? of GERD (ICD-530.81) She describes food hangup, and the soreness in her chest and cough may be from reflux disease. We discussed reflux prevention. She is instructed to discuss this with Dr Debby Bud. Her updated medication list for this problem  includes:    Pepcid Ac Maximum Strength 20 Mg Tabs (Famotidine) .[name]... 1 daily before breakfast  Medications Added to Medication List This Visit: 1)  Nasalcrom 5.2 Mg/act Aers (Cromolyn sodium) .[name].. 1-2 sprays each nostril four times a day 2)  Pepcid Ac Maximum Strength 20 Mg Tabs (Famotidine) .[name].. 1 daily before breakfast  Other Orders: Est. Patient Level II (57846) Allergy Puncture Test (96295) Allergy I.D Test (28413)  Hypertension Assessment/Plan:      The patient's hypertensive risk group is category C: Target organ damage and/or diabetes.  Today's blood pressure is 120/78.     Patient Instructions: 1)  Return 3 months or as needed 2)  Try otc nasal spray Nasalcrom/ cromolyn 3)  Try Pepcid 20 mg otc once daily before breakfast for at least a month- see if it helps the chest discomfort. 4)  if you have more of those uncomfortable food-hangup spells, be sure to let Dr Debby Bud know. Don't lie down for a couple of hours after you eat. 5)  A1AT test kit Prescriptions: PEPCID AC MAXIMUM STRENGTH 20 MG TABS (FAMOTIDINE) 1 daily before breakfast  #30 x prn    Entered and Authorized by:   Waymon Budge MD   Signed by:   Waymon Budge MD on 05/25/2010   Method used:   Historical   RxID:   [id number] NASALCROM 5.2 MG/ACT AERS (CROMOLYN SODIUM) 1-2 sprays each nostril four times a day  #1 bottle x prn   Entered and Authorized by:   Waymon Budge MD   Signed by:   Waymon Budge MD on 05/25/2010   Method used:   Historical   RxID:   [id number]

## 2010-10-09 NOTE — Miscellaneous (Signed)
Summary: BONE DENSITY  Clinical Lists Changes  Orders: Added new Test order of T-Bone Densitometry (77080) - Signed Added new Test order of T-Lumbar Vertebral Assessment (77082) - Signed 

## 2010-10-09 NOTE — Letter (Signed)
Summary: [facility] Consult Scheduled Letter  [facility]  [address] [place], [place] [address]   Phone: [phone]  Fax: [phone]      04/09/2010 MRN: [id number]  Wendy Love [address] [place], [place]  [address]    Dear [name],      We have scheduled an appointment for you.  At the recommendation of Dr.Norins, we have scheduled you a consult with Dr Maple Hudson on 05/10/10 at 10:30am.  Their phone number is [phone].  If this appointment day and time is not convenient for you, please feel free to call the office of the doctor you are being referred to at the number listed above and reschedule the appointment.    [facility] [address] [place], [place] [address] *Pulmonary Dept.2nd floor*    Please give 24hr notice if you need to cancel/reschedule to avoid a $50.00 fee.Also bring insurance card and any co-pay due at time of visit.    Thank you,  Patient Care Coordinator [facility]

## 2010-10-09 NOTE — Letter (Signed)
   [facility] [address] [place], [place]  [address] Phone: [phone]      February 22, 2010   Wendy Love [address] [place], [place] [address]  RE:  LAB RESULTS  Dear  [name],  The following is an interpretation of your most recent lab tests.  Please take note of any instructions provided or changes to medications that have resulted from your lab work.    Bone Density study reveals normal bone density.   Call or e-mail me if you have questions ([email or web address]).   Sincerely Yours,    [name]

## 2010-10-09 NOTE — Progress Notes (Signed)
  Phone Note Refill Request Message from:  Fax from Pharmacy on May 22, 2010 9:34 AM  Refills Requested: Medication #1:  IBUPROFEN 600 MG TABS 1 by mouth three times a day Initial call taken by: Ami Bullins CMA,  May 22, 2010 9:34 AM    Prescriptions: IBUPROFEN 600 MG TABS (IBUPROFEN) 1 by mouth three times a day  #270 x 3   Entered by:   Ami Bullins CMA   Authorized by:   [name]   Signed by:   Bill Salinas CMA on 05/22/2010   Method used:   Electronically to        [facility]* (retail)             ,          Ph: [phone]       Fax: [phone]   RxID:   [id number]

## 2010-10-09 NOTE — Progress Notes (Signed)
Summary: RF  Phone Note Refill Request   Refills Requested: Medication #1:  IBUPROFEN 600 MG TABS 1 by mouth three times a day   Notes: TO GO TO [facility] - 90 day supply  Medication #2:  TEMAZEPAM 15 MG CAPS one tablet at bedtime   Notes: TO GO TO [facility] [address] [place] - 90 day supply OK FOR RF's?   Initial call taken by: Lamar Sprinkles, CMA,  May 15, 2010 9:50 AM  Follow-up for Phone Call        ok for refills: #1 - as needed #2 - x 5 Follow-up by: [name],  May 15, 2010 10:18 AM    Prescriptions: TEMAZEPAM 15 MG CAPS (TEMAZEPAM) one tablet at bedtime  #30 x 5   Entered by:   Ami Bullins CMA   Authorized by:   [name]   Signed by:   Bill Salinas CMA on 05/15/2010   Method used:   Telephoned to ...       [facility] [address] (retail)       [address], [address]       [place], [place]  [address]       Ph: [phone]       Fax: [phone]   RxID:   [id number] IBUPROFEN 600 MG TABS (IBUPROFEN) 1 by mouth three times a day  #270 x 1   Entered by:   Ami Bullins CMA   Authorized by:   [name]   Signed by:   Bill Salinas CMA on 05/15/2010   Method used:   Telephoned to ...       [facility] [address] (retail)       [address], [address]       [place], [place]  [address]       Ph: [phone]       Fax: [phone]   RxID:   [id number]

## 2010-10-11 NOTE — Assessment & Plan Note (Signed)
Summary: FU Wendy Love  #   Vital Signs:  Patient profile:   70 year old female Height:      65 inches Weight:      133 pounds BMI:     22.21 O2 Sat:      96 % on Room air Temp:     98.0 degrees F oral Pulse rate:   84 / minute BP sitting:   162 / 80  (left arm) Cuff size:   regular  Vitals Entered By: Bill Salinas CMA (September 14, 2010 1:04 PM)  O2 Flow:  Room air CC: pt here for 6 month f/u. Pt still has c/o of persistent cough, hoarseness, and gas with bloating. Pt did have a Barium Swallow done on 09/12/2010/ ab   Primary Care Provider:  [name]  CC:  pt here for 6 month f/u. Pt still has c/o of persistent cough, hoarseness, and and gas with bloating. Pt did have a Barium Swallow done on 09/12/2010/ ab.  History of Present Illness: In the interval since her last visit the patient has had a thorough pulmonary/allergy evaluation: PFTs Jul 26'11 essentially normal. Repeat PFTs Sept 2, '11 - mild COPD, no response to bronchodilators, mild decrease in diffusion capacity. Six minute walk test Sept 2nd - normal distance covered with minimal symptoms. Allergen serum testing done and skin testing done with only mild allergy response. alpha-1-antitrypsin - normal. Last office visit with Dr. Maple Hudson reviewed: he recommends stopping ACE-I, continuing anti-reflux medications and ordered Barium esophagram. Results reviewed - mild motility disorder noted, no stricture.  Patient reports that she continues to cough, continues to have hoarseness.  She has had continued heartburn despite taking H2 blocker. She is taking high dose ibuprofen daily for her back pain. No report of hemetemesis or hematochezia.  She continues to have back pain, a long standing problem s/p surgical intervention.  Current Medications (verified): 1)  Hydrochlorothiazide 25 Mg  Tabs (Hydrochlorothiazide) .... Take 1 Tablet By Mouth Once A Day 2)  Levothyroxine Sodium 25 Mcg  Tabs (Levothyroxine Sodium) .... Take 1  Tablet By Mouth Once A Day 3)  Ibuprofen 600 Mg Tabs (Ibuprofen) .[name].. 1 By Mouth Three Times A Day 4)  Lisinopril 20 Mg Tabs (Lisinopril) .[name].. 1 By Mouth Two Times A Day 5)  Triamcinolone Acetonide 0.1 % Oint (Triamcinolone Acetonide) .... 2x A Day 6)  Aspirin Low Dose 81 Mg Tabs (Aspirin) .[name].. 1 Once Daily 7)  Temazepam 15 Mg Caps (Temazepam) .... One Tablet At Bedtime 8)  Fish Oil 1000 Mg Caps (Omega-3 Fatty Acids) .... Two Times A Day 9)  Coq10 100 Mg Caps (Coenzyme Q10) .... Once Daily 10)  Vitamin C 500 Mg Tabs (Ascorbic Acid) .... Once Daily 11)  B Complex 50  Tabs (B Complex Vitamins) .... Once Daily 12)  Vitamin D3 2000 Unit Caps (Cholecalciferol) .... Once Daily 13)  Zinc 25 Mg Tabs (Zinc) .... Once Daily 14)  Calcium-Magnesium 500-250 Mg Tabs (Calcium-Magnesium) .... Two Times A Day 15)  Probiotic  Caps (Probiotic Product) .... Once Daily 16)  Zostavax 95621 Unt/0.53ml Solr (Zoster Vaccine Live) .... As Directed 17)  Nasalcrom 5.2 Mg/act Aers (Cromolyn Sodium) .[name].. 1-2 Sprays Each Nostril Four Times A Day 18)  Pepcid Ac Maximum Strength 20 Mg Tabs (Famotidine) .[name].. 1 Daily Before Breakfast  Allergies (verified): 1)  ! Codeine 2)  ! * Zytec 3)  ! Claritin 4)  ! Benadryl 5)  ! * Lodrane 6)  * Int.  Atenolol  Past History:  Past Medical History: Last updated: 05/25/2010 Hypertension Hyperthyroidism Depression carpal tunnel anxiety myalgias sciatica rhinosinusitis Bronchitis- PFT 05/11/10- FEV1 1.97/ 92%, FEV1/FVC 0.71, 25-75 62%, no resp to BD, DLCO .69.                  - Allergy skin test Mild pos. 05/25/10                   - IgE 4.6, Neg allergy IgE profile Back pain  Past Surgical History: Last updated: 05/10/2010 Hysterectomy Stress Cardiolite (10/17/2005) Carpal tunnel release: Right - Nov '09; Left - Jan '10 ( Dr. Teressa Senter) L3-4 hemilaminectomy, L4-5 micro-diskectomy April '10 (Dr. Rise Mu) Appendectomy FH reviewed for relevance, SH/Risk Factors reviewed for  relevance  Review of Systems       The patient complains of hoarseness, dyspnea on exertion, prolonged cough, headaches, severe indigestion/heartburn, and difficulty walking.  The patient denies anorexia, fever, weight loss, weight gain, decreased hearing, chest pain, syncope, peripheral edema, hemoptysis, abdominal pain, melena, hematochezia, incontinence, muscle weakness, suspicious skin lesions, unusual weight change, abnormal bleeding, and enlarged lymph nodes.    Physical Exam  General:  Well-developed,well-nourished,in no acute distress; alert,appropriate and cooperative throughout examination Head:  normocephalic and atraumatic.   Eyes:  pupils equal and pupils round.  C&S clear Neck:  supple.   Lungs:  normal respiratory effort, normal breath sounds, no fremitus, no crackles, and no wheezes.   Heart:  normal rate, regular rhythm, and no murmur.   Abdomen:  soft, normal bowel sounds, no guarding, and no rigidity.  Tendeer to palpation in the epigastric region, mild tenderness right flank. Msk:  no joint tenderness, no joint swelling, no joint warmth, no redness over joints, and no joint instability.   Pulses:  2+ radial Neurologic:  alert & oriented X3, cranial nerves II-XII intact, and gait normal.   Skin:  turgor normal and color normal.   Psych:  Oriented X3, memory intact for recent and remote, normally interactive, good eye contact, and not anxious appearing.     Impression & Recommendations:  Problem # 1:  COUGH, CHRONIC (ICD-786.2) Very thorough evaluation (see HPI).  Plan - will stop lisinopril (ACE-I)           start Diovan 320 mg 1 by mouth once daily            follow-up in 3-[redacted] weeks along with repeat metabolic panel           better acid suppression with PPI therapy  Problem # 2:  ? of GERD (ICD-530.81) Patient with dyspepsia most likely related to use of NSAIDs for back pain.  Plan - stop H2 blocker           start PPI - omeprazole 40mg  qAM  Her updated  medication list for this problem includes:    Omeprazole 40 Mg Cpdr (Omeprazole) .[name]... 1 by mouth q am  Problem # 3:  MUSCLE PAIN (ICD-729.1) Multiple sources of on-going pain: DJD back and neck in particular.  Plan - stop ibuprofen           trial of celebrex for pain with lower side effect profile. Pt cautioned on GI risks  Her updated medication list for this problem includes:    Celebrex 200 Mg Caps (Celecoxib) .[name]... 1 by mouth q am    Aspirin Low Dose 81 Mg Tabs (Aspirin) .[name]... 1 once daily  Problem # 4:  HYPERTENSION (ICD-401.9)  Her updated medication list  for this problem includes:    Hydrochlorothiazide 25 Mg Tabs (Hydrochlorothiazide) .[name]... Take 1 tablet by mouth once a day    Diovan 320 Mg Tabs (Valsartan) .[name]... 1 by mouth once daily  BP today: 162/80 Prior BP: 120/78 (08/23/2010)  Prior 10 Yr Risk Heart Disease: N/A (05/25/2010)  BP remains variable. Needs to come off of ACE-I (see #1)  Plan - trial of high dose diovan @ 320mg  daily. Continue HCTZ.           follow-up 3-4 weeks.   Labs Reviewed: K+: 4.1 (01/29/2010) Creat: : 0.6 (01/29/2010)   Chol: 281 (01/29/2010)   HDL: 115.70 (01/29/2010)   TG: 97.0 (01/29/2010)  Problem # 5:  CHANGE IN BOWELS (ZOX-096.04) Patient reports a change in stool caliber with consistently more narrow stools. Her last colonoscopy was May '03. She is concerned about this change. She has had no melena, hematochezia, major changes in weight, night sweats or other systemic symptoms.  Plan - refer to [facility] for colonoscopy.  Complete Medication List: 1)  Hydrochlorothiazide 25 Mg Tabs (Hydrochlorothiazide) .... Take 1 tablet by mouth once a day 2)  Levothyroxine Sodium 25 Mcg Tabs (Levothyroxine sodium) .... Take 1 tablet by mouth once a day 3)  Celebrex 200 Mg Caps (Celecoxib) .[name].. 1 by mouth q am 4)  Diovan 320 Mg Tabs (Valsartan) .[name].. 1 by mouth once daily 5)  Triamcinolone Acetonide 0.1 % Oint (Triamcinolone acetonide) .... 2x a day 6)   Aspirin Low Dose 81 Mg Tabs (Aspirin) .[name].. 1 once daily 7)  Temazepam 15 Mg Caps (Temazepam) .... One tablet at bedtime 8)  Fish Oil 1000 Mg Caps (Omega-3 fatty acids) .... Two times a day 9)  Coq10 100 Mg Caps (Coenzyme q10) .... Once daily 10)  Vitamin C 500 Mg Tabs (Ascorbic acid) .... Once daily 11)  B Complex 50 Tabs (B complex vitamins) .... Once daily 12)  Vitamin D3 2000 Unit Caps (Cholecalciferol) .... Once daily 13)  Zinc 25 Mg Tabs (Zinc) .... Once daily 14)  Calcium-magnesium 500-250 Mg Tabs (Calcium-magnesium) .... Two times a day 15)  Probiotic Caps (Probiotic product) .... Once daily 16)  Zostavax 54098 Unt/0.53ml Solr (Zoster vaccine live) .... As directed 17)  Nasalcrom 5.2 Mg/act Aers (Cromolyn sodium) .[name].. 1-2 sprays each nostril four times a day 18)  Omeprazole 40 Mg Cpdr (Omeprazole) .[name].. 1 by mouth q am  Other Orders: Gastroenterology Referral (GI)  Patient Instructions: 1)  blood pressure - will need to change from lisinopril to diovan 320mg  once a day in order to reduce risk of ACE related cough. 2)  Reflux/ dyspepsia - need to stop ibuprofen. Will start omeprazole for acid control. You may use pepcid for breakthrough GI pain 3)  Back pain - will stop ibuprofen and substiute celebrex 200mg  once a day. Less GI irritation but no zero. If you have on-going GI symptoms may need to stop all NSAIDS 4)  Change in stool caliber - will schedule colonoscopy - last study was in '03.  Prescriptions: DIOVAN 320 MG TABS (VALSARTAN) 1 by mouth once daily  #30 x 3   Entered and Authorized by:   [name]   Signed by:   [name] on 09/14/2010   Method used:   Electronically to        [facility] [phone]* (retail)       [address] [place], [place]  [address]  Ph: [phone]       Fax: [phone]   RxID:   [id number] OMEPRAZOLE 40 MG CPDR (OMEPRAZOLE) 1 by mouth q AM  #30 x 12   Entered and Authorized by:   Jacques Navy  MD   Signed by:   [name] on 09/14/2010   Method used:   Electronically to        [facility] [phone]* (retail)       [address] [place], [place]  [address]       Ph: [phone]       Fax: [phone]   RxID:   [id number] CELEBREX 200 MG CAPS (CELECOXIB) 1 by mouth q AM  #30 x 5   Entered and Authorized by:   [name]   Signed by:   [name] on 09/14/2010   Method used:   Electronically to        [facility] [phone]* (retail)       [address] [place], [place]  [address]       Ph: [phone]       Fax: [phone]   RxID:   [id number]    Orders Added: 1)  Gastroenterology Referral [GI] 2)  Est. Patient Level IV [[address]]

## 2010-10-11 NOTE — Assessment & Plan Note (Signed)
Summary: 3 months/apc   Copy to:  [name] Primary Wendy Love/Referring [name]:  [name]  CC:  3 month follow up visit-allergies; Allergies not getting any worse and has been taking Pepcid as requested at last OV.More hoarsness in the past week.Wendy Love  History of Present Illness: May 25, 2010- Cough No change in her cough, interferes with singing at church. More sinus congestion but nose and chest not productive, no fever and no chest pain. Denies wheeze. Persistent heavy soreness on right upper anterior chest. Had an episode of food hangup last night- finally went down with water. Not recognizing reflux or heartburn.  IgE 4.6, Allergy profile NEG Skin test- Mild positives for some grass, tree, weed and dust antigens, PFT- complete- mild obstructive disease in small airways, no resp to BD, DLCO mildly reduced. Lung volumes normal. FEV1 1.97/ 92%, R 0.71.DLCO .69 6 MWT- 100%, 100%, 100%, 492 meters.  August 23, 2010- COUGH, dysphagia Nurse-CC: 3 month follow up visit-allergies; Allergies not getting any worse and has been taking Pepcid as requested at last OV as an additional antihistamne. .. Little change in hoarseness or cough. Continues antireflux measures and Pepcid AC. She has refilled Lisinopril- discussed again. She was only off it for 2 week trial and alternative Valturna she tried didn't control BP. Notes dry mouth at waking.    Preventive Screening-Counseling & Management  Alcohol-Tobacco     Alcohol drinks/day: 0     Smoking Status: quit     Packs/Day: .5     Year Started: 1980     Year Quit: 1982     Passive Smoke Exposure: no  Current Medications (verified): 1)  Hydrochlorothiazide 25 Mg  Tabs (Hydrochlorothiazide) .... Take 1 Tablet By Mouth Once A Day 2)  Levothyroxine Sodium 25 Mcg  Tabs (Levothyroxine Sodium) .... Take 1 Tablet By Mouth Once A Day 3)  Ibuprofen 600 Mg Tabs (Ibuprofen) .Wendy Love.. 1 By Mouth Three Times A Day 4)  Lisinopril 20 Mg  Tabs (Lisinopril) .Wendy Love.. 1 By Mouth Two Times A Day 5)  Triamcinolone Acetonide 0.1 % Oint (Triamcinolone Acetonide) .... 2x A Day 6)  Aspirin Low Dose 81 Mg Tabs (Aspirin) .Wendy Love.. 1 Once Daily 7)  Temazepam 15 Mg Caps (Temazepam) .... One Tablet At Bedtime 8)  Fish Oil 1000 Mg Caps (Omega-3 Fatty Acids) .... Two Times A Day 9)  Coq10 100 Mg Caps (Coenzyme Q10) .... Once Daily 10)  Vitamin C 500 Mg Tabs (Ascorbic Acid) .... Once Daily 11)  B Complex 50  Tabs (B Complex Vitamins) .... Once Daily 12)  Vitamin D3 2000 Unit Caps (Cholecalciferol) .... Once Daily 13)  Zinc 25 Mg Tabs (Zinc) .... Once Daily 14)  Calcium-Magnesium 500-250 Mg Tabs (Calcium-Magnesium) .... Two Times A Day 15)  Probiotic  Caps (Probiotic Product) .... Once Daily 16)  Zostavax 44034 Unt/0.25ml Solr (Zoster Vaccine Live) .... As Directed 17)  Nasalcrom 5.2 Mg/act Aers (Cromolyn Sodium) .Wendy Love.. 1-2 Sprays Each Nostril Four Times A Day 18)  Pepcid Ac Maximum Strength 20 Mg Tabs (Famotidine) .Wendy Love.. 1 Daily Before Breakfast  Allergies (verified): 1)  ! Codeine 2)  ! * Zytec 3)  ! Claritin 4)  ! Benadryl 5)  ! * Lodrane 6)  * Int. Atenolol  Past History:  Past Medical History: Last updated: 05/25/2010 Hypertension Hyperthyroidism Depression carpal tunnel anxiety myalgias sciatica rhinosinusitis Bronchitis- PFT 05/11/10- FEV1 1.97/ 92%, FEV1/FVC 0.71, 25-75 62%, no resp to BD, DLCO .69.                  -  Allergy skin test Mild pos. 05/25/10                   - IgE 4.6, Neg allergy IgE profile Back pain  Past Surgical History: Last updated: May 21, 2010 Hysterectomy Stress Cardiolite (10/17/2005) Carpal tunnel release: Right - Nov '09; Left - Jan '10 ( Dr. Teressa Senter) L3-4 hemilaminectomy, L4-5 micro-diskectomy April '10 (Dr. Rise Mu) Appendectomy  Family History: Last updated: 05-21-10 father - deceased @ 37: CAD/MI, CVA, HTN, Lipids, DM mother - 16: died- ovarian Ca, HTN, Lipids Son with bladder cancer with  mets Neg- breast or colon cancer   Social History: Last updated: May 21, 2010 Mother deceased @ 81: had ovarian cancer, died of PNA 2022/10/01 01/29/23) [employer] finished [facility] [employer] [facility] [employer] married - '61 - 43yrs/divorced; married '82 step - son work: full time homemaker end-of-life issues: no futile or heroic care, full palliative care if needed. Does want attempt at resuscitation (May '10) Patient states formers smoker, Started in 1980 quit in 1982.   Risk Factors: Alcohol Use: 0 (08/23/2010) Caffeine Use: 0 (09/27/2008) Diet: healthy diet (04/19/2009) Exercise: no (04/19/2009)  Risk Factors: Smoking Status: quit (08/23/2010) Packs/Day: .5 (08/23/2010) Passive Smoke Exposure: no (08/23/2010)  Review of Systems      See HPI       The patient complains of acid heartburn and non-productive cough.  The patient denies shortness of breath with activity, shortness of breath at rest, productive cough, coughing up blood, chest pain, irregular heartbeats, indigestion, loss of appetite, weight change, abdominal pain, difficulty swallowing, sore throat, tooth/dental problems, headaches, nasal congestion/difficulty breathing through nose, and sneezing.    Vital Signs:  Patient profile:   70 year old female Height:      65 inches Weight:      133.38 pounds BMI:     22.28 O2 Sat:      100 % on Room air Pulse rate:   85 / minute BP sitting:   120 / 78  (left arm) Cuff size:   regular  Vitals Entered By: Reynaldo Minium CMA (August 23, 2010 1:34 PM)  O2 Flow:  Room air CC: 3 month follow up visit-allergies; Allergies not getting any worse and has been taking Pepcid as requested at last OV.More hoarsness in the past week.   Physical Exam  Additional Exam:  General: A/Ox3; pleasant and cooperative, NAD, WDWN   Not coughing at all today.  SKIN: no rash, lesions NODES: no lymphadenopathy HEENT: Smith/AT, EOM- WNL, Conjuctivae- clear, PERRLA, TM-WNL, Nose- clear, Throat- clear and wnl, Mallampati  II, no drip or  erythema NECK: Supple w/ fair ROM, JVD- none, normal carotid impulses w/o bruits Thyroid- normal to palpation, , no stridor CHEST: Clear to P&A HEART: RRR, no m/g/r heard ABDOMEN:not obese HQI:ONGE, nl pulses, no edema  NEURO: Grossly intact to observation      Impression & Recommendations:  Problem # 1:  ? of GERD (ICD-530.81)  This is suggested by her hx of food hangup,  and her lack of change with season further reinforces impression that her complaints of hoarseness and cough are not primarily allergy,  although there may be a seasonal component at times.  She doesn't have pending appointment with Dr Debby Bud, so I agreed to get a barium swallow done.  Her updated medication list for this problem includes:    Pepcid Ac Maximum Strength 20 Mg Tabs (Famotidine) .Wendy Love... 1 daily before breakfast  Problem # 2:  ? of ALLERGIC RHINITIS CAUSE UNSPECIFIED (ICD-477.9) She is continuing meds and  will watch through season change in the Spring. Her updated medication list for this problem includes:    Nasalcrom 5.2 Mg/act Aers (Cromolyn sodium) .Wendy Love... 1-2 sprays each nostril four times a day  Problem # 3:  HYPERTENSION (ICD-401.9) I  would like her to have a trial of a non ACE inhibitor for longer- more like a month. We will defer this to Dr Debby Bud.  Her updated medication list for this problem includes:    Hydrochlorothiazide 25 Mg Tabs (Hydrochlorothiazide) .Wendy Love... Take 1 tablet by mouth once a day    Lisinopril 20 Mg Tabs (Lisinopril) .Wendy Love... 1 by mouth two times a day  Other Orders: Est. Patient Level III ([address]) Radiology Referral (Radiology)  Patient Instructions: 1)  Make a follow-up with Dr Debby Bud to review your reflux and BP issues.  2)  I will be happy to see you again as needed.  3)  See [facility] to schedule barium swallow 4)  Continue Pepcid and reflux precautions.   Prevention & Chronic Care Immunizations   Influenza vaccine: Fluvax 3+  (06/06/2009)    Tetanus booster: 06/06/2009:  Td    Pneumococcal vaccine: Pneumovax  (04/24/2009)    H. zoster vaccine: 05/28/2010: Zostavax  Colorectal Screening   Hemoccult: Not documented    Colonoscopy: Done  (01/08/2002)  Other Screening   Pap smear: Not documented    Mammogram: normal  (07/10/2009)    DXA bone density scan: Not documented   Smoking status: quit  (08/23/2010)  Lipids   Total Cholesterol: 281  (01/29/2010)   LDL: Not documented   LDL Direct: 156.2  (01/29/2010)   HDL: 115.70  (01/29/2010)   Triglycerides: 97.0  (01/29/2010)    SGOT (AST): 30  (01/29/2010)   SGPT (ALT): 22  (01/29/2010)   Alkaline phosphatase: 64  (01/29/2010)   Total bilirubin: 0.7  (01/29/2010)  Hypertension   Last Blood Pressure: 120 / 78  (08/23/2010)   Serum creatinine: 0.6  (01/29/2010)   Serum potassium 4.1  (01/29/2010)  Self-Management Support :    Hypertension self-management support: Not documented    Lipid self-management support: Not documented

## 2010-10-16 ENCOUNTER — Other Ambulatory Visit: Payer: Self-pay | Admitting: Internal Medicine

## 2010-10-16 ENCOUNTER — Encounter: Payer: Self-pay | Admitting: Internal Medicine

## 2010-10-16 ENCOUNTER — Encounter (INDEPENDENT_AMBULATORY_CARE_PROVIDER_SITE_OTHER): Payer: Self-pay | Admitting: *Deleted

## 2010-10-16 ENCOUNTER — Ambulatory Visit (INDEPENDENT_AMBULATORY_CARE_PROVIDER_SITE_OTHER): Payer: Medicare Other | Admitting: Internal Medicine

## 2010-10-16 ENCOUNTER — Other Ambulatory Visit: Payer: Medicare Other

## 2010-10-16 DIAGNOSIS — E785 Hyperlipidemia, unspecified: Secondary | ICD-10-CM

## 2010-10-16 DIAGNOSIS — R232 Flushing: Secondary | ICD-10-CM

## 2010-10-16 DIAGNOSIS — I10 Essential (primary) hypertension: Secondary | ICD-10-CM

## 2010-10-16 DIAGNOSIS — E039 Hypothyroidism, unspecified: Secondary | ICD-10-CM

## 2010-10-16 DIAGNOSIS — M5430 Sciatica, unspecified side: Secondary | ICD-10-CM

## 2010-10-16 DIAGNOSIS — R198 Other specified symptoms and signs involving the digestive system and abdomen: Secondary | ICD-10-CM

## 2010-10-16 DIAGNOSIS — K219 Gastro-esophageal reflux disease without esophagitis: Secondary | ICD-10-CM

## 2010-10-16 LAB — BASIC METABOLIC PANEL
BUN: 11 mg/dL (ref 6–23)
CO2: 27 mEq/L (ref 19–32)
Glucose, Bld: 92 mg/dL (ref 70–99)
Potassium: 4.6 mEq/L (ref 3.5–5.1)
Sodium: 130 mEq/L — ABNORMAL LOW (ref 135–145)

## 2010-10-16 LAB — CBC WITH DIFFERENTIAL/PLATELET
Basophils Relative: 0.9 % (ref 0.0–3.0)
Eosinophils Absolute: 0 10*3/uL (ref 0.0–0.7)
Eosinophils Relative: 0.9 % (ref 0.0–5.0)
Hemoglobin: 10.4 g/dL — ABNORMAL LOW (ref 12.0–15.0)
MCHC: 33.7 g/dL (ref 30.0–36.0)
MCV: 81.3 fl (ref 78.0–100.0)
Monocytes Absolute: 0.6 10*3/uL (ref 0.1–1.0)
Neutro Abs: 3.2 10*3/uL (ref 1.4–7.7)
Neutrophils Relative %: 66.3 % (ref 43.0–77.0)
RBC: 3.8 Mil/uL — ABNORMAL LOW (ref 3.87–5.11)
WBC: 4.8 10*3/uL (ref 4.5–10.5)

## 2010-10-16 LAB — LIPID PANEL
HDL: 99.8 mg/dL (ref >39.00)
Total CHOL/HDL Ratio: 2
Triglycerides: 60 mg/dL (ref 0.0–149.0)

## 2010-10-16 LAB — HEPATIC FUNCTION PANEL
AST: 28 U/L (ref 0–37)
Bilirubin, Direct: 0.1 mg/dL (ref 0.0–0.3)
Total Bilirubin: 0.3 mg/dL (ref 0.3–1.2)

## 2010-10-16 LAB — LDL CHOLESTEROL, DIRECT: Direct LDL: 131.8 mg/dL

## 2010-10-16 LAB — HEMOGLOBIN A1C: Hgb A1c MFr Bld: 5.5 % (ref 4.6–6.5)

## 2010-10-17 ENCOUNTER — Encounter (INDEPENDENT_AMBULATORY_CARE_PROVIDER_SITE_OTHER): Payer: Self-pay | Admitting: *Deleted

## 2010-10-22 ENCOUNTER — Telehealth: Payer: Self-pay | Admitting: Internal Medicine

## 2010-10-25 NOTE — Letter (Signed)
Summary: New Patient letter  [facility]  [address] [place], [place] [address]   Phone: [phone]  Fax: [phone]       10/17/2010 MRN: [id number]  Wendy Love [address] [place], [place]  [address]  [place]  Dear Wendy Love,  Welcome to the Gastroenterology Division at [facility].    You are scheduled to see Dr.  Russella Dar on 11-26-10 at 10:00A.M. on the 3rd floor at [facility], [address].  We ask that you try to arrive at our office 15 minutes prior to your appointment time to allow for check-in.  We would like you to complete the enclosed self-administered evaluation form prior to your visit and bring it with you on the day of your appointment.  We will review it with you.  Also, please bring a complete list of all your medications or, if you prefer, bring the medication bottles and we will list them.  Please bring your insurance card so that we may make a copy of it.  If your insurance requires a referral to see a specialist, please bring your referral form from your primary care physician.  Co-payments are due at the time of your visit and may be paid by cash, check or credit card.     Your office visit will consist of a consult with your physician (includes a physical exam), any laboratory testing he/she may order, scheduling of any necessary diagnostic testing (e.g. x-ray, ultrasound, CT-scan), and scheduling of a procedure (e.g. Endoscopy, Colonoscopy) if required.  Please allow enough time on your schedule to allow for any/all of these possibilities.    If you cannot keep your appointment, please call [phone] to cancel or reschedule prior to your appointment date.  This allows [place] the opportunity to schedule an appointment for another patient in need of care.  If you do not cancel or reschedule by 5 p.m. the business day prior to your appointment date, you will be charged a $50.00 late cancellation/no-show fee.    Thank you for  choosing [facility] for your medical needs.  We appreciate the opportunity to care for you.  Please visit [place] at our website  to learn more about our practice.                     Sincerely,                                                             The Gastroenterology Division

## 2010-10-25 NOTE — Assessment & Plan Note (Signed)
Summary: f/u appt/ needs extended ov per dr Debby Bud always/cd   Vital Signs:  Patient profile:   70 year old female Height:      65 inches Weight:      128 pounds BMI:     21.38 O2 Sat:      99 % on Room air Temp:     97.3 degrees F oral Pulse rate:   71 / minute BP sitting:   140 / 90  (left arm) Cuff size:   regular  Vitals Entered By: Bill Salinas CMA (October 16, 2010 10:38 AM)  O2 Flow:  Room air  Primary Care Provider:  [name]   History of Present Illness: Patient presents for complex follow up.  She reports that the coughing and hoarseness have resolved  She is having chronic abdominal pain with bloating, gas, sharp stabbing pain, occasional nausea, increased bowel frequency with a change in stool caliber. She denies melanic stools or hematochezia. She does take celebrex every day. She also takes omeprazole 40mg  once a day. She has not had EGD. She has had a barium swallow with slightly abnormal primary contractions. She has had sensitivity to NSAIDs and was switched from  ibuprofen to celebrex.   She reports that her blood pressure is better controlled but she will have tachycardia, reporting rates into the high 80's,  and she will develop flushing and diaphoresis and light-headedness. IN general she will have increased heart rate over the interval since her last visit. She can have the above symptoms at rest as well as while active. For similar symptoms she had 24 hr urine for catecholamines and metanephrines in April 2011  that returned as normal. She questions whether this can be anemia or medication related.  Continues to have chronic low back pain, more difficult to control if NSAIDs are stopped.       Current Medications (verified): 1)  Hydrochlorothiazide 25 Mg  Tabs (Hydrochlorothiazide) .... Take 1 Tablet By Mouth Once A Day 2)  Levothyroxine Sodium 25 Mcg  Tabs (Levothyroxine Sodium) .... Take 1 Tablet By Mouth Once A Day 3)  Celebrex 200 Mg Caps  (Celecoxib) .[name].. 1 By Mouth Q Am 4)  Diovan 320 Mg Tabs (Valsartan) .[name].. 1 By Mouth Once Daily 5)  Triamcinolone Acetonide 0.1 % Oint (Triamcinolone Acetonide) .... 2x A Day 6)  Aspirin Low Dose 81 Mg Tabs (Aspirin) .[name].. 1 Once Daily 7)  Temazepam 15 Mg Caps (Temazepam) .... One Tablet At Bedtime 8)  Fish Oil 1000 Mg Caps (Omega-3 Fatty Acids) .... Two Times A Day 9)  Coq10 100 Mg Caps (Coenzyme Q10) .... Once Daily 10)  Vitamin C 500 Mg Tabs (Ascorbic Acid) .... Once Daily 11)  B Complex 50  Tabs (B Complex Vitamins) .... Once Daily 12)  Vitamin D3 2000 Unit Caps (Cholecalciferol) .... Once Daily 13)  Zinc 25 Mg Tabs (Zinc) .... Once Daily 14)  Calcium-Magnesium 500-250 Mg Tabs (Calcium-Magnesium) .... Two Times A Day 15)  Probiotic  Caps (Probiotic Product) .... Once Daily 16)  Zostavax 51884 Unt/0.57ml Solr (Zoster Vaccine Live) .... As Directed 17)  Nasalcrom 5.2 Mg/act Aers (Cromolyn Sodium) .[name].. 1-2 Sprays Each Nostril Four Times A Day 18)  Omeprazole 40 Mg Cpdr (Omeprazole) .[name].. 1 By Mouth Q Am  Allergies (verified): 1)  ! Codeine 2)  ! * Zytec 3)  ! Claritin 4)  ! Benadryl 5)  ! * Lodrane 6)  * Int. Atenolol  Past History:  Past Medical  History: Last updated: 05/25/2010 Hypertension Hyperthyroidism Depression carpal tunnel anxiety myalgias sciatica rhinosinusitis Bronchitis- PFT 05/11/10- FEV1 1.97/ 92%, FEV1/FVC 0.71, 25-75 62%, no resp to BD, DLCO .69.                  - Allergy skin test Mild pos. 05/25/10                   - IgE 4.6, Neg allergy IgE profile Back pain  Past Surgical History: Last updated: 06/02/10 Hysterectomy Stress Cardiolite (10/17/2005) Carpal tunnel release: Right - Nov '09; Left - Jan '10 ( Dr. Teressa Senter) L3-4 hemilaminectomy, L4-5 micro-diskectomy April '10 (Dr. Rise Mu) Appendectomy  Family History: Last updated: June 02, 2010 father - deceased @ 66: CAD/MI, CVA, HTN, Lipids, DM mother - 60: died- ovarian Ca, HTN, Lipids Son with  bladder cancer with mets Neg- breast or colon cancer   Social History: Last updated: 2010/06/02 Mother deceased @ 39: had ovarian cancer, died of PNA 2022/10/13 2023-02-10) [employer] [facility] married - '61 - 61yrs/divorced; married '82 step - son work: full time homemaker end-of-life issues: no futile or heroic care, full palliative care if needed. Does want attempt at resuscitation (May '10) Patient states formers smoker, Started in 1980 quit in 1982.   Review of Systems       The patient complains of abdominal pain and severe indigestion/heartburn.  The patient denies anorexia, fever, weight loss, hoarseness, chest pain, dyspnea on exertion, prolonged cough, melena, hematochezia, muscle weakness, difficulty walking, depression, and enlarged lymph nodes.    Physical Exam  General:  alert, well-developed, well-nourished, well-hydrated, and normal appearance.   Head:  normocephalic and atraumatic.   Eyes:  vision grossly intact, pupils equal, and pupils round.   Neck:  supple, no masses, and no thyromegaly.   Lungs:  normal respiratory effort and normal breath sounds.   Heart:  normal rate, regular rhythm, no murmur, and no JVD.   Abdomen:  soft, normal bowel sounds, no masses, no guarding, no rigidity, and no hepatomegaly.  Tender to percussion and deep palpation in the epigastrum. Mild tenderness in the suprapubic region to percussion not to deep palpation  Msk:  no joint swelling and no joint warmth.   Pulses:  2+ radial pulse bilaterally Neurologic:  alert & oriented X3, cranial nerves II-XII intact, gait normal, and DTRs symmetrical and normal.   Skin:  turgor normal, color normal, no rashes, and no petechiae.   Cervical Nodes:  no anterior cervical adenopathy and no posterior cervical adenopathy.   Psych:  Oriented X3, memory intact for recent and remote, normally interactive, good eye contact, and slightly anxious.     Impression & Recommendations:  Problem # 1:  OTHER SYMPTOMS  INVOLVING DIGESTIVE SYSTEM OTHER (ICD-787.99) Very likely to have NSAIDS induced gastritis without evidence of bleeding. She has a high level of concern about her digestive symptoms  Plan - d/c  NSAIDs           Increase PPI therapy to two times a day           refer to GI - current symptoms evaluation and she is due for colonoscopy  Orders: Gastroenterology Referral (GI)  Problem # 2:  HYPERTENSION (ICD-401.9)  Her updated medication list for this problem includes:    Hydrochlorothiazide 25 Mg Tabs (Hydrochlorothiazide) .[name]... Take 1 tablet by mouth once a day    Diovan 320 Mg Tabs (Valsartan) .[name]... 1 by mouth once daily She reports much better control of her  blood pressure on Diovan 320. However, she is concerned that some of her symptoms of light-headedness and weakness may be due to over-treatment, i.e. BP's that runn too low.  Plan - check Bmet to ensure normal electrolytes           reduce Diovan to 1/2 320 mg tab daily. Orders: TLB-BMP (Basic Metabolic Panel-BMET) (80048-METABOL)  BP today: 140/90 Prior BP: 162/80 (09/14/2010)  Problem # 3:  FLUSHING (ICD-782.62) Symtoms set described has been evaluated in the past and pheo has been ruled out. May be that her symptoms are BP related, see #2.  Plan - r/o hypoglycemia as cause.  Orders: TLB-A1C / Hgb A1C (Glycohemoglobin) (83036-A1C)  addendum - A1C 5.5% - normal  Problem # 4:  DYSLIPIDEMIA (ICD-272.4) Due for lab with recommendations to follow  Orders: TLB-Lipid Panel (80061-LIPID) TLB-Hepatic/Liver Function Pnl (80076-HEPATIC)  Addendum - LDL 131 - very close to goal of 130 or less  Problem # 5:  SCIATICA, RIGHT (ICD-724.3) chronic back pain despite surgical interventions. Now will need to stop celbrex.  Plan- APAP 1000mg  three times a day          tramadol 50mg  1 or 2 every 8 hours as needed.  Her updated medication list for this problem includes:    Celebrex 200 Mg Caps (Celecoxib) .[name]... 1 by mouth q am     Aspirin Low Dose 81 Mg Tabs (Aspirin) .[name]... 1 once daily  Problem # 6:  UNSPECIFIED HYPOTHYROIDISM (ICD-244.9) Due for thyroid follow-up to r/o as cause of symptoms  Her updated medication list for this problem includes:    Levothyroxine Sodium 25 Mcg Tabs (Levothyroxine sodium) .[name]... Take 1 tablet by mouth once a day  Orders: TLB-TSH (Thyroid Stimulating Hormone) (84443-TSH) TLB-T4 (Thyrox), Free [phone])  Addendum - normal thyroid functions on present dose of medication.  Complete Medication List: 1)  Hydrochlorothiazide 25 Mg Tabs (Hydrochlorothiazide) .... Take 1 tablet by mouth once a day 2)  Levothyroxine Sodium 25 Mcg Tabs (Levothyroxine sodium) .... Take 1 tablet by mouth once a day 3)  Celebrex 200 Mg Caps (Celecoxib) .[name].. 1 by mouth q am 4)  Diovan 320 Mg Tabs (Valsartan) .[name].. 1 by mouth once daily 5)  Triamcinolone Acetonide 0.1 % Oint (Triamcinolone acetonide) .... 2x a day 6)  Aspirin Low Dose 81 Mg Tabs (Aspirin) .[name].. 1 once daily 7)  Temazepam 15 Mg Caps (Temazepam) .... One tablet at bedtime 8)  Fish Oil 1000 Mg Caps (Omega-3 fatty acids) .... Two times a day 9)  Coq10 100 Mg Caps (Coenzyme q10) .... Once daily 10)  Vitamin C 500 Mg Tabs (Ascorbic acid) .... Once daily 11)  B Complex 50 Tabs (B complex vitamins) .... Once daily 12)  Vitamin D3 2000 Unit Caps (Cholecalciferol) .... Once daily 13)  Zinc 25 Mg Tabs (Zinc) .... Once daily 14)  Calcium-magnesium 500-250 Mg Tabs (Calcium-magnesium) .... Two times a day 15)  Probiotic Caps (Probiotic product) .... Once daily 16)  Zostavax 02725 Unt/0.6ml Solr (Zoster vaccine live) .... As directed 17)  Nasalcrom 5.2 Mg/act Aers (Cromolyn sodium) .[name].. 1-2 sprays each nostril four times a day 18)  Omeprazole 40 Mg Cpdr (Omeprazole) .[name].. 1 by mouth q am  Other Orders: TLB-CBC Platelet - w/Differential (85025-CBCD)  Patient Instructions: 1)  GI- leading cause of your symptoms is NSAID irritation from the celebrex.  Irritible bowel syndrom may also play a part but will not treat for this at this time. Plan - stop celebrex; increase omeprazole to  two times a day dosing for a couple of weeks; will refer to GI for further evaluation, colonoscopy and possible upper endoscopy. 2)  Rapid heart rate - may be over-treated with diovan so will reduce to 1/2 tablet daily. Will rule out anemia or metabolic derangement with a series of lab studies. Have ruled out adrenal gland tumors as recently as April 2011. No indication for additional cardiac testing at this time. 3)  Pain management - take 1000mg  acetominophen (tylenol) on schedule three times a day; use the tramadol 50mg  tablets 1or 2 tablets every 8 hours as needed. 4)  Thyroid function - will check thyroid labs today.   Patient: Wendy Love Note: All result statuses are Final unless otherwise noted.  Tests: (1) CBC Platelet w/Diff (CBCD)   White Cell Count          4.8 K/uL                    4.5-10.5   Red Cell Count       [L]  3.80 Mil/uL                 3.87-5.11   Hemoglobin           [L]  10.4 g/dL                   04.5-40.9   Hematocrit           [L]  30.9 %                      36.0-46.0   MCV                       81.3 fl                     78.0-100.0   MCHC                      33.7 g/dL                   81.1-91.4   RDW                       13.9 %                      11.5-14.6   Platelet Count            330.0 K/uL                  150.0-400.0   Neutrophil %              66.3 %                      43.0-77.0   Lymphocyte %              20.1 %                      12.0-46.0   Monocyte %                11.8 %                      3.0-12.0   Eosinophils%              0.9 %  0.0-5.0   Basophils %               0.9 %                       0.0-3.0   Neutrophill Absolute      3.2 K/uL                    1.4-7.7   Lymphocyte Absolute       1.0 K/uL                    0.7-4.0   Monocyte Absolute         0.6 K/uL                     0.1-1.0  Eosinophils, Absolute                             0.0 K/uL                    0.0-0.7   Basophils Absolute        0.0 K/uL                    0.0-0.1  Tests: (2) Lipid Panel (LIPID)   Cholesterol          [H]  249 mg/dL                   1-610     ATP III Classification            Desirable:  < 200 mg/dL                    Borderline High:  200 - 239 mg/dL               High:  > = 240 mg/dL   Triglycerides             60.0 mg/dL                  9.6-045.4     Normal:  <150 mg/dL     Borderline High:  098 - 199 mg/dL   HDL                       11.91 mg/dL                 >47.82   VLDL Cholesterol          12.0 mg/dL                  9.5-62.1  CHO/HDL Ratio:  CHD Risk                             2                    Men          Women     1/2 Average Risk     3.4          3.3     Average Risk          5.0          4.4     2X Average Risk          9.6  7.1     3X Average Risk          15.0          11.0                           Tests: (3) Hepatic/Liver Function Panel (HEPATIC)   Total Bilirubin           0.3 mg/dL                   1.6-1.0   Direct Bilirubin          0.1 mg/dL                   9.6-0.4   Alkaline Phosphatase      56 U/L                      39-117   AST                       28 U/L                      0-37   ALT                       17 U/L                      0-35   Total Protein             6.4 g/dL                    5.4-0.9   Albumin                   3.9 g/dL                    8.1-1.9  Tests: (4) BMP (METABOL)   Sodium               [L]  130 mEq/L                   135-145   Potassium                 4.6 mEq/L                   3.5-5.1   Chloride             [L]  95 mEq/L                    96-112   Carbon Dioxide            27 mEq/L                    19-32   Glucose                   92 mg/dL                    14-78   BUN                       11 mg/dL                    2-95   Creatinine  0.7 mg/dL                    2.1-3.0   Calcium                   9.4 mg/dL                   8.6-57.8   GFR                       87.83 mL/min                >60.00  Tests: (5) TSH (TSH)   FastTSH                   4.00 uIU/mL                 0.35-5.50  Tests: (6) T4, Free (FT4R)   Free T4                   0.77 ng/dL                  0.60-1.60  Tests: (7) Hemoglobin A1C (A1C)   Hemoglobin A1C            5.5 %                       4.6-6.5     Glycemic Control Guidelines for People with Diabetes:     Non Diabetic:  <6%     Goal of Therapy: <7%     Additional Action Suggested:  >8%   Tests: (8) Cholesterol LDL - Direct (DIRLDL)  Cholesterol LDL - Direct                             131.8 mg/dL     Optimal:  <469 mg/dL     Near or Above Optimal:  100-129 mg/dL     Borderline High:  629-528 mg/dL     High:  413-244 mg/dL     Very High:  >010 mg/dL  Prescriptions: LEVOTHYROXINE SODIUM 25 MCG  TABS (LEVOTHYROXINE SODIUM) Take 1 tablet by mouth once a day  #90 Each x 3   Entered and Authorized by:   [name]   Signed by:   [name] on 10/17/2010   Method used:   Faxed to ...       [facility] (mail-order)             , [place]         Ph: [phone]       Fax: [phone]   RxID:   [id number] HYDROCHLOROTHIAZIDE 25 MG  TABS (HYDROCHLOROTHIAZIDE) Take 1 tablet by mouth once a day  #90 x 3   Entered and Authorized by:   [name]   Signed by:   [name] on 10/16/2010   Method used:   Faxed to ...       [facility] (mail-order)             , [place]         Ph: [phone]       Fax: [phone]   RxID:   [id number]    Orders Added: 1)  Gastroenterology Referral [GI] 2)  TLB-CBC Platelet - w/Differential [85025-CBCD] 3)  TLB-Lipid Panel [80061-LIPID] 4)  TLB-Hepatic/Liver Function Pnl [80076-HEPATIC]  5)  TLB-BMP (Basic Metabolic Panel-BMET) [80048-METABOL] 6)  TLB-TSH (Thyroid Stimulating Hormone) [84443-TSH] 7)  TLB-T4 (Thyrox), Free [04540-JW1X] 8)  TLB-A1C /  Hgb A1C (Glycohemoglobin) [83036-A1C] 9)  Est. Patient Level V [91478]

## 2010-10-31 NOTE — Progress Notes (Signed)
Summary: BP MED  Phone Note Call from Patient   Summary of Call: Patient decreased BP med from 1 to 1/2 tab to help decrease weakness, dizzyness and palpations. This did not work - she decreased to 1/4 tab. She has been much better on this lower dose. At one point pt was so weak husband called 911. She checked out ok by EMS.  Pt noted that her sodium, chloride and hgb were low. Could this cause her symptoms?  Initial call taken by: Lamar Sprinkles, CMA,  October 22, 2010 11:21 AM  Follow-up for Phone Call        the sodium and chloride are not low enough to cause any symptoms. The low Hgb is unlikely to cause any symptoms - may want to be checked for any bleeding. Can pick up hemocult cards.    What is her blood pressure on reduced med dose? Follow-up by: [name],  October 22, 2010 1:06 PM  Additional Follow-up for Phone Call Additional follow up Details #1::        Pt informed, after 30 - 40 minutes of standing or walking pt becomes dizzy, lightheaded, exhausted, heart pounds hard, sweaty. This has not improved despite 1/4th of tab. Bp has been 114/62 and as high as 130/70.   Will mail hemocult cards to patient. Additional Follow-up by: Lamar Sprinkles, CMA,  October 22, 2010 4:39 PM

## 2010-11-01 ENCOUNTER — Encounter (INDEPENDENT_AMBULATORY_CARE_PROVIDER_SITE_OTHER): Payer: Self-pay | Admitting: *Deleted

## 2010-11-01 ENCOUNTER — Other Ambulatory Visit: Payer: Self-pay | Admitting: Internal Medicine

## 2010-11-01 ENCOUNTER — Other Ambulatory Visit: Payer: Medicare Other

## 2010-11-01 DIAGNOSIS — Z1289 Encounter for screening for malignant neoplasm of other sites: Secondary | ICD-10-CM

## 2010-11-01 LAB — HEMOCCULT SLIDES (X 3 CARDS)
OCCULT 1: NEGATIVE
OCCULT 2: NEGATIVE
OCCULT 3: NEGATIVE
OCCULT 4: NEGATIVE

## 2010-11-04 ENCOUNTER — Telehealth: Payer: Self-pay | Admitting: Internal Medicine

## 2010-11-09 ENCOUNTER — Ambulatory Visit: Payer: Medicare Other | Admitting: Internal Medicine

## 2010-11-13 ENCOUNTER — Encounter (INDEPENDENT_AMBULATORY_CARE_PROVIDER_SITE_OTHER): Payer: Self-pay | Admitting: *Deleted

## 2010-11-13 ENCOUNTER — Ambulatory Visit (INDEPENDENT_AMBULATORY_CARE_PROVIDER_SITE_OTHER): Payer: Medicare Other | Admitting: Internal Medicine

## 2010-11-13 ENCOUNTER — Other Ambulatory Visit: Payer: Medicare Other

## 2010-11-13 ENCOUNTER — Encounter: Payer: Self-pay | Admitting: Internal Medicine

## 2010-11-13 ENCOUNTER — Other Ambulatory Visit: Payer: Self-pay | Admitting: Internal Medicine

## 2010-11-13 DIAGNOSIS — D649 Anemia, unspecified: Secondary | ICD-10-CM | POA: Insufficient documentation

## 2010-11-13 DIAGNOSIS — I4719 Other supraventricular tachycardia: Secondary | ICD-10-CM | POA: Insufficient documentation

## 2010-11-13 DIAGNOSIS — I471 Supraventricular tachycardia: Secondary | ICD-10-CM | POA: Insufficient documentation

## 2010-11-13 DIAGNOSIS — I479 Paroxysmal tachycardia, unspecified: Secondary | ICD-10-CM

## 2010-11-13 DIAGNOSIS — I10 Essential (primary) hypertension: Secondary | ICD-10-CM

## 2010-11-13 LAB — CBC WITH DIFFERENTIAL/PLATELET
Basophils Absolute: 0 10*3/uL (ref 0.0–0.1)
Eosinophils Absolute: 0.1 10*3/uL (ref 0.0–0.7)
Eosinophils Relative: 2.5 % (ref 0.0–5.0)
HCT: 30 % — ABNORMAL LOW (ref 36.0–46.0)
Lymphs Abs: 1 10*3/uL (ref 0.7–4.0)
MCHC: 33.3 g/dL (ref 30.0–36.0)
MCV: 78.7 fl (ref 78.0–100.0)
Monocytes Absolute: 0.5 10*3/uL (ref 0.1–1.0)
Platelets: 319 10*3/uL (ref 150.0–400.0)
RDW: 15.2 % — ABNORMAL HIGH (ref 11.5–14.6)

## 2010-11-13 LAB — B12 AND FOLATE PANEL: Vitamin B-12: >1500 pg/mL — ABNORMAL HIGH (ref 211–911)

## 2010-11-13 LAB — IBC PANEL: Saturation Ratios: 5.9 % — ABNORMAL LOW (ref 20.0–50.0)

## 2010-11-15 NOTE — Progress Notes (Signed)
Summary: Wendy Love  Phone Note Outgoing Call   Reason for Call: Discuss lab or test results Summary of Call: pleaase call pt - hemocult negaive x 6 (100%)  Thanks Initial call taken by: [name],  November 04, 2010 11:44 PM  Follow-up for Phone Call        lmovm for pt to call back/Ami Bullins CMA  November 05, 2010 8:39 AM   Additional Follow-up for Phone Call Additional follow up Details #1::        informed pt of results. Pt states her heart rate continues to be high. She states it has been in the upper 90s. Pt was concerned and just wanted to let you know. Pt states her BP continues to stay low. She is just concerned with her heart rate. Additional Follow-up by: Ami Bullins CMA,  November 05, 2010 2:40 PM    Additional Follow-up for Phone Call Additional follow up Details #2::    we can discuss a change in medication at an office visit.  Follow-up by: [name],  November 05, 2010 6:10 PM  Additional Follow-up for Phone Call Additional follow up Details #3:: Details for Additional Follow-up Action Taken: Scheduled for office visit Friday PM Additional Follow-up by: Lamar Sprinkles, CMA,  November 06, 2010 5:29 PM

## 2010-11-20 ENCOUNTER — Telehealth: Payer: Self-pay | Admitting: Internal Medicine

## 2010-11-20 NOTE — Assessment & Plan Note (Signed)
Summary: Discuss BP meds/SD   Vital Signs:  Patient profile:   70 year old female Height:      65 inches Weight:      132 pounds BMI:     22.05 O2 Sat:      99 % on Room air Temp:     97.6 degrees F oral Pulse rate:   84 / minute BP sitting:   148 / 82  (left arm) Cuff size:   regular  Vitals Entered By: Bill Salinas CMA (November 13, 2010 11:21 AM)  O2 Flow:  Room air CC: ov to discuss medications/ab   Primary Care Provider:  [name]  CC:  ov to discuss medications/ab.  History of Present Illness: Mrs. Quilter has been on a reduced dose of diovan at 80mg  with good blood pressure control. She still has episodes of tachycardia which can make her symptomatic but no to the point of syncope. Concern as to whether she has an underlying cardiac arrythmia. She has never had a holter or event recorder.   Lab did reveal that the patient did develop anemia with a drop in Hgb from 13.5 to 10.4 over a period of several months with no overt bleeding. She did submit hemocult cards which were negative x 6. She is scheduled to see Dr. Russella Dar March 19th for evaluation of anemia. Her last colonoscopy was approximately 9 years ago.   Current Medications (verified): 1)  Hydrochlorothiazide 25 Mg  Tabs (Hydrochlorothiazide) .... Take 1 Tablet By Mouth Once A Day 2)  Levothyroxine Sodium 25 Mcg  Tabs (Levothyroxine Sodium) .... Take 1 Tablet By Mouth Once A Day 3)  Celebrex 200 Mg Caps (Celecoxib) .[name].. 1 By Mouth Q Am 4)  Diovan 320 Mg Tabs (Valsartan) .[name].. 1 By Mouth Once Daily 5)  Triamcinolone Acetonide 0.1 % Oint (Triamcinolone Acetonide) .... 2x A Day 6)  Aspirin Low Dose 81 Mg Tabs (Aspirin) .[name].. 1 Once Daily 7)  Temazepam 15 Mg Caps (Temazepam) .... One Tablet At Bedtime 8)  Fish Oil 1000 Mg Caps (Omega-3 Fatty Acids) .... Two Times A Day 9)  Coq10 100 Mg Caps (Coenzyme Q10) .... Once Daily 10)  Vitamin C 500 Mg Tabs (Ascorbic Acid) .... Once Daily 11)  B Complex 50  Tabs (B Complex  Vitamins) .... Once Daily 12)  Vitamin D3 2000 Unit Caps (Cholecalciferol) .... Once Daily 13)  Zinc 25 Mg Tabs (Zinc) .... Once Daily 14)  Calcium-Magnesium 500-250 Mg Tabs (Calcium-Magnesium) .... Two Times A Day 15)  Probiotic  Caps (Probiotic Product) .... Once Daily 16)  Zostavax 53664 Unt/0.68ml Solr (Zoster Vaccine Live) .... As Directed 17)  Nasalcrom 5.2 Mg/act Aers (Cromolyn Sodium) .[name].. 1-2 Sprays Each Nostril Four Times A Day 18)  Omeprazole 40 Mg Cpdr (Omeprazole) .[name].. 1 By Mouth Q Am  Allergies (verified): 1)  ! Codeine 2)  ! * Zytec 3)  ! Claritin 4)  ! Benadryl 5)  ! * Lodrane 6)  * Int. Atenolol PMH-FH-SH reviewed-no changes except otherwise noted  Review of Systems  The patient denies anorexia, fever, weight loss, weight gain, hoarseness, chest pain, dyspnea on exertion, prolonged cough, abdominal pain, severe indigestion/heartburn, muscle weakness, difficulty walking, and abnormal bleeding.    Physical Exam  General:  Well-developed,well-nourished,in no acute distress; alert,appropriate and cooperative throughout examination Head:  normocephalic and atraumatic.   Eyes:  C&S clear Neck:  supple and full ROM.   Lungs:  normal respiratory effort.   Heart:  normal  rate and regular rhythm.   Neurologic:  alert & oriented X3, cranial nerves II-XII intact, and gait normal.   Skin:  turgor normal, color normal, and no rashes.   Cervical Nodes:  no anterior cervical adenopathy and no posterior cervical adenopathy.   Psych:  Oriented X3, memory intact for recent and remote, normally interactive, and good eye contact.     Impression & Recommendations:  Problem # 1:  ANEMIA (ICD-285.9) Patient with significant drop in Hgb. She is scheduled to see Dr. Russella Dar for evaluation for possible GI losses.  Plan - f/u CBC           Iron panel, retic count, B12 to evaluate for cause of anemia  Orders: TLB-CBC Platelet - w/Differential (85025-CBCD) TLB-B12 + Folate Pnl  (29562_13086-V78/ION) TLB-IBC Pnl (Iron/FE;Transferrin) (83550-IBC) T-Reticulocyte Count, Automated (62952-84132)  Problem # 2:  PAROXYSMAL TACHYCARDIA (ICD-427.2) Patient with persistent intermittent tachycardia  Plan - event recorder to help make diagnosis  Her updated medication list for this problem includes:    Aspirin Low Dose 81 Mg Tabs (Aspirin) .[name]... 1 once daily  Orders: Cardiology Referral (Cardiology)  Problem # 3:  HYPERTENSION (ICD-401.9) Good control on low dose diovan and HCTZ.  Her updated medication list for this problem includes:    Hydrochlorothiazide 25 Mg Tabs (Hydrochlorothiazide) .[name]... Take 1 tablet by mouth once a day    Diovan 320 Mg Tabs (Valsartan) .[name]... 1 by mouth once daily  Complete Medication List: 1)  Hydrochlorothiazide 25 Mg Tabs (Hydrochlorothiazide) .... Take 1 tablet by mouth once a day 2)  Levothyroxine Sodium 25 Mcg Tabs (Levothyroxine sodium) .... Take 1 tablet by mouth once a day 3)  Celebrex 200 Mg Caps (Celecoxib) .[name].. 1 by mouth q am 4)  Diovan 320 Mg Tabs (Valsartan) .[name].. 1 by mouth once daily 5)  Triamcinolone Acetonide 0.1 % Oint (Triamcinolone acetonide) .... 2x a day 6)  Aspirin Low Dose 81 Mg Tabs (Aspirin) .[name].. 1 once daily 7)  Temazepam 15 Mg Caps (Temazepam) .... One tablet at bedtime 8)  Fish Oil 1000 Mg Caps (Omega-3 fatty acids) .... Two times a day 9)  Coq10 100 Mg Caps (Coenzyme q10) .... Once daily 10)  Vitamin C 500 Mg Tabs (Ascorbic acid) .... Once daily 11)  B Complex 50 Tabs (B complex vitamins) .... Once daily 12)  Vitamin D3 2000 Unit Caps (Cholecalciferol) .... Once daily 13)  Zinc 25 Mg Tabs (Zinc) .... Once daily 14)  Calcium-magnesium 500-250 Mg Tabs (Calcium-magnesium) .... Two times a day 15)  Probiotic Caps (Probiotic product) .... Once daily 16)  Zostavax 44010 Unt/0.69ml Solr (Zoster vaccine live) .... As directed 17)  Nasalcrom 5.2 Mg/act Aers (Cromolyn sodium) .[name].. 1-2 sprays each nostril four times a  day 18)  Omeprazole 40 Mg Cpdr (Omeprazole) .[name].. 1 by mouth q am Prescriptions: TEMAZEPAM 15 MG CAPS (TEMAZEPAM) one tablet at bedtime  #90 x 1   Entered and Authorized by:   [name]   Signed by:   [name] on 11/13/2010   Method used:   Print then Give to Patient   RxID:   [id number]    Orders Added: 1)  TLB-CBC Platelet - w/Differential [85025-CBCD] 2)  TLB-B12 + Folate Pnl [82746_82607-B12/FOL] 3)  TLB-IBC Pnl (Iron/FE;Transferrin) [83550-IBC] 4)  T-Reticulocyte Count, Automated [[id number]] 5)  Cardiology Referral [Cardiology] 6)  Est. Patient Level III [32951]

## 2010-11-22 ENCOUNTER — Encounter (INDEPENDENT_AMBULATORY_CARE_PROVIDER_SITE_OTHER): Payer: Medicare Other

## 2010-11-22 DIAGNOSIS — I472 Ventricular tachycardia: Secondary | ICD-10-CM

## 2010-11-22 DIAGNOSIS — I4729 Other ventricular tachycardia: Secondary | ICD-10-CM

## 2010-11-26 ENCOUNTER — Encounter: Payer: Self-pay | Admitting: Gastroenterology

## 2010-11-26 ENCOUNTER — Ambulatory Visit (INDEPENDENT_AMBULATORY_CARE_PROVIDER_SITE_OTHER): Payer: Medicare Other | Admitting: Gastroenterology

## 2010-11-26 DIAGNOSIS — R1013 Epigastric pain: Secondary | ICD-10-CM

## 2010-11-26 DIAGNOSIS — D509 Iron deficiency anemia, unspecified: Secondary | ICD-10-CM

## 2010-11-27 NOTE — Progress Notes (Signed)
Summary: RESULTS  Phone Note Call from Patient   Summary of Call: Patient is requesting results of labs.  Initial call taken by: Lamar Sprinkles, CMA,  November 20, 2010 12:54 PM  Follow-up for Phone Call        Hgb low at 10.0. Iron studies show iron deficiency with Iron of 28, % iron saturation low at 5.9%. Reticulocyte count normal. Thus, iron deficiency anemia. Strngly recommend GI consult for colonoscopy and possibly EGD looking for source of blood loss. Follow-up by: [name],  November 20, 2010 9:45 PM  Additional Follow-up for Phone Call Additional follow up Details #1::        Pt informed, has apt w/Dr Russella Dar on Monday Additional Follow-up by: Lamar Sprinkles, CMA,  November 22, 2010 3:59 PM

## 2010-11-27 NOTE — Procedures (Signed)
Summary: Colonoscopy   Colonoscopy  Procedure date:  01/08/2002  Findings:      Results: Normal. Location:  [facility].    Procedures Next Due Date:    Colonoscopy: 01/2012 Patient Name: Wendy Love, Wendy Love MRN:  Procedure Procedures: Colonoscopy CPT: 54098.  Personnel: Endoscopist: Ulyess Mort, MD.  Referred By: Rosalyn Gess. Norins, MD.  Exam Location: Exam performed in Outpatient Clinic. Outpatient  Patient Consent: Procedure, Alternatives, Risks and Benefits discussed, consent obtained, from patient. Consent was obtained by the RN.  Indications  Average Risk Screening Routine.  History  Pre-Exam Physical: Entire physical exam was normal.  Exam Exam: Extent of exam reached: Cecum, extent intended: Cecum.  The cecum was identified by IC valve. Patient position: on left side. Colon retroflexion performed. Images taken. ASA Classification: II. Tolerance: good.  Monitoring: Pulse and BP monitoring, Oximetry used. Supplemental O2 given.  Colon Prep Used Golytely for colon prep.  Sedation Meds: Patient assessed and found to be appropriate for moderate (conscious) sedation. Fentanyl 100 mcg. given IV. Versed 10 mg. given IV.  Findings - OTHER FINDING: Descending Colon. Comments: Fixed left colon.  - NORMAL EXAM: Cecum to Rectum. Comments: long tortuous colon; difficult exam.   Assessment Normal examination.  Events  Unplanned Interventions: No intervention was required.  Unplanned Events: There were no complications. Plans Medication Plan: Continue current medications.  Comments: Repeat colon exam in 5 years. Disposition: After procedure patient sent to recovery. After recovery patient sent home.  This report was created from the original endoscopy report, which was reviewed and signed by the above listed endoscopist.    cc: Illene Regulus, MD

## 2010-11-30 ENCOUNTER — Encounter: Payer: Self-pay | Admitting: Gastroenterology

## 2010-12-06 NOTE — Assessment & Plan Note (Signed)
Summary: IBS SYMPTOMS//SCH'D W/DEBRA//BCBS//MEDLIST//CX POLICY   History of Present Illness Visit Type: Follow-up Visit Primary GI MD: Elie Goody MD [facility] Primary Provider: [name] Chief Complaint: iron deficiency anemia  c/o dizziness, fatigue, weakness,  SOB, bloating, gas and abdominal pain after eating History of Present Illness:    This is a 70 year old female who was recently found to have an iron deficiency anemia with Hemoccult-negative stool. She relates frequent postprandial epigastric pain associated with gas and bloating. She was maintained on ibuprofen 600 mg 3 times a day which was recently discontinued and omeprazole was recently started and her abdominal pain has improved but has not abated. She previously underwent colonoscopy by Dr. Victorino Dike in May 2003 which was normal.   GI Review of Systems    Reports abdominal pain, belching, and  bloating.      Denies acid reflux, chest pain, dysphagia with liquids, dysphagia with solids, heartburn, loss of appetite, nausea, vomiting, vomiting blood, weight loss, and  weight gain.      Reports hemorrhoids.     Denies anal fissure, black tarry stools, change in bowel habit, constipation, diarrhea, diverticulosis, fecal incontinence, heme positive stool, irritable bowel syndrome, jaundice, light color stool, liver problems, rectal bleeding, and  rectal pain.   Current Medications (verified): 1)  Hydrochlorothiazide 25 Mg  Tabs (Hydrochlorothiazide) .... Take 1 Tablet By Mouth Once A Day 2)  Levothyroxine Sodium 25 Mcg  Tabs (Levothyroxine Sodium) .... Take 1 Tablet By Mouth Once A Day 3)  Diovan 80 Mg Tabs (Valsartan) .[name].. 1 By Mouth Once Daily 4)  Triamcinolone Acetonide 0.1 % Oint (Triamcinolone Acetonide) .[name].. 1 X A Day 5)  Aspirin Low Dose 81 Mg Tabs (Aspirin) .[name].. 1 Once Daily 6)  Temazepam 15 Mg Caps (Temazepam) .... One Tablet At Bedtime 7)  Fish Oil 1000 Mg Caps (Omega-3 Fatty Acids) .... Two Times A Day 8)   Coq10 100 Mg Caps (Coenzyme Q10) .... Once Daily 9)  Vitamin C 500 Mg Tabs (Ascorbic Acid) .... Once Daily 10)  B Complex 50  Tabs (B Complex Vitamins) .... Once Daily 11)  Vitamin D3 2000 Unit Caps (Cholecalciferol) .... Once Daily 12)  Zinc 25 Mg Tabs (Zinc) .... Once Daily 13)  Calcium-Magnesium 500-250 Mg Tabs (Calcium-Magnesium) .... Two Times A Day 14)  Probiotic  Caps (Probiotic Product) .... Once Daily 15)  Zostavax 81191 Unt/0.55ml Solr (Zoster Vaccine Live) .... As Directed 16)  Nasalcrom 5.2 Mg/act Aers (Cromolyn Sodium) .[name].. 1-2 Sprays Each Nostril Four Times A Day  As Needed 17)  Omeprazole 40 Mg Cpdr (Omeprazole) .[name].. 1 By Mouth Q Am 18)  Tramadol Hcl 50 Mg Tabs (Tramadol Hcl) .... 1/2 Tablet By Mouth At Bedtime  Allergies (verified): 1)  ! Codeine 2)  ! * Zytec 3)  ! Claritin 4)  ! Benadryl 5)  ! * Lodrane 6)  * Int. Atenolol  Past History:  Past Medical History: Reviewed history from 05/25/2010 and no changes required. Hypertension Hyperthyroidism Depression carpal tunnel anxiety myalgias sciatica rhinosinusitis Bronchitis- PFT 05/11/10- FEV1 1.97/ 92%, FEV1/FVC 0.71, 25-75 62%, no resp to BD, DLCO .69.                  - Allergy skin test Mild pos. 05/25/10                   - IgE 4.6, Neg allergy IgE profile Back pain  Past Surgical History: Reviewed history from 05/10/2010 and no changes required.  Hysterectomy Stress Cardiolite (10/17/2005) Carpal tunnel release: Right - Nov '09; Left - Jan '10 ( Dr. Teressa Senter) L3-4 hemilaminectomy, L4-5 micro-diskectomy April '10 (Dr. Rise Mu) Appendectomy  Family History: Reviewed history from 05/10/2010 and no changes required. father - deceased @ 7: CAD/MI, CVA, HTN, Lipids, DM mother - 59: died- ovarian Ca, HTN, Lipids Son with bladder cancer with mets Neg- breast or colon cancer  No FH of Colon Cancer:  Social History: Reviewed history from 05/10/2010 and no changes required. Mother deceased @ 13: had  ovarian cancer, died of PNA 09-23-22 01/21/23) [employer] [facility] at [employer] [facility] married - '61 - 36yrs/divorced; married '82 step - son work: full time homemaker end-of-life issues: no futile or heroic care, full palliative care if needed. Does want attempt at resuscitation (May '10) Patient states formers smoker, Started in 1980 quit in 1982.   Review of Systems       The patient complains of arthritis/joint pain, back pain, and heart rhythm changes.         The pertinent positives and negatives are noted as above and in the HPI. All other ROS were reviewed and were negative.   Vital Signs:  Patient profile:   70 year old female Height:      65 inches Weight:      131 pounds BMI:     21.88 Pulse rate:   80 / minute Pulse rhythm:   regular BP sitting:   150 / 80  (left arm)  Vitals Entered By: Milford Cage NCMA (November 26, 2010 10:04 AM)  Physical Exam  General:  Well developed, well nourished, no acute distress. Head:  Normocephalic and atraumatic. Eyes:  PERRLA, no icterus. Ears:  Normal auditory acuity. Mouth:  No deformity or lesions, dentition normal. Neck:  Supple; no masses or thyromegaly. Lungs:  Clear throughout to auscultation. Heart:  Regular rate and rhythm; no murmurs, rubs,  or bruits. Abdomen:  Soft, nontender and nondistended. No masses, hepatosplenomegaly or hernias noted. Normal bowel sounds. Rectal:  deferred until time of colonoscopy.   Msk:  Symmetrical with no gross deformities. Normal posture. Pulses:  Normal pulses noted. Extremities:  No clubbing, cyanosis, edema or deformities noted. Neurologic:  Alert and  oriented x4;  grossly normal neurologically. Cervical Nodes:  No significant cervical adenopathy. Inguinal Nodes:  No significant inguinal adenopathy. Psych:  Alert and cooperative. Normal mood and affect.  Impression & Recommendations:  Problem # 1:  ANEMIA, IRON DEFICIENCY (ICD-280.9)  Iron deficiency anemia associated with Hemoccult-negative stool.  Prior history of heavy ibuprofen usage. She remains on daily aspirin. Rule out occult gastrointestinal sources of blood loss and iron malabsorption. The risks, benefits and alternatives to colonoscopy with possible biopsy and possible polypectomy were discussed with the patient and they consent to proceed. The procedure will be scheduled electively. The risks, benefits and alternatives to endoscopy with possible biopsy and possible dilation were discussed with the patient and they consent to proceed. The procedure will be scheduled electively. Orders: Colon/Endo (Colon/Endo)  Problem # 2:  ABDOMINAL PAIN, EPIGASTRIC (ICD-789.06)  Rule out gastritis and ulcer disease. Continue omeprazole 20 mg daily and continue to avoid or at least minimize NSAID usage. Orders: Colon/Endo (Colon/Endo)  Patient Instructions: 1)  Colonoscopy brochure given.  2)  Upper Endoscopy brochure given.  3)  Pick up your prep from your pharmacy.  4)  Copy sent to : Illene Regulus, MD 5)  The medication list was reviewed and reconciled.  All changed / newly prescribed medications were explained.  A complete medication list was provided to the patient / caregiver.  Prescriptions: MOVIPREP 100 GM  SOLR (PEG-KCL-NACL-NASULF-NA ASC-C) As per prep instructions.  #1 x 0   Entered by:   Christie Nottingham CMA (AAMA)   Authorized by:   Meryl Dare MD [facility]   Signed by:   Meryl Dare MD FACG on 11/26/2010   Method used:   Electronically to        [facility] (retail)       [address]       [place], [place]  [address]       Ph: [phone]       Fax: [phone]   RxID:   [id number]   Appended Document: IBS SYMPTOMS//SCH'D W/DEBRA//BCBS//MEDLIST//CX POLICY    Clinical Lists Changes  Medications: Rx of MOVIPREP 100 GM  SOLR (PEG-KCL-NACL-NASULF-NA ASC-C) As per prep instructions.;  #1 x 0;  Signed;  Entered by: Christie Nottingham CMA (AAMA);  Authorized by:  Meryl Dare MD Clementeen Graham;  Method used: Electronically to [facility] [phone]*, [address], [place], [place]  [address], Ph: [phone], Fax: [phone]    Prescriptions: MOVIPREP 100 GM  SOLR (PEG-KCL-NACL-NASULF-NA ASC-C) As per prep instructions.  #1 x 0   Entered by:   Christie Nottingham CMA (AAMA)   Authorized by:   Meryl Dare MD [facility]   Signed by:   Christie Nottingham CMA (AAMA) on 11/26/2010   Method used:   Electronically to        [facility] [phone]* (retail)       [address] [place], [place]  [address]       Ph: [phone]       Fax: [phone]   RxID:   [id number]

## 2010-12-06 NOTE — Letter (Signed)
Summary: [facility] Instructions  [facility]  [address] [place], [place] [address]   Phone: [phone]  Fax: [phone]       LISMARY KIEHN    05-30-1941    MRN: [id number]        Procedure Day /Date: Tuesday April 17th, 2012     Arrival Time: 1:30pm     Procedure Time: 2:30pm     Location of Procedure:                    _ x_  [facility] (4th Floor)                        PREPARATION FOR COLONOSCOPY WITH MOVIPREP   Starting 5 days prior to your procedure 12/20/10 do not eat nuts, seeds, popcorn, corn, beans, peas,  salads, or any raw vegetables.  Do not take any fiber supplements (e.g. Metamucil, Citrucel, and Benefiber).  THE DAY BEFORE YOUR PROCEDURE         DATE: 12/24/10  DAY: Monday  1.  Drink clear liquids the entire day-NO SOLID FOOD  2.  Do not drink anything colored red or purple.  Avoid juices with pulp.  No orange juice.  3.  Drink at least 64 oz. (8 glasses) of fluid/clear liquids during the day to prevent dehydration and help the prep work efficiently.  CLEAR LIQUIDS INCLUDE: Water Jello Ice Popsicles Tea (sugar ok, no milk/cream) Powdered fruit flavored drinks Coffee (sugar ok, no milk/cream) Gatorade Juice: apple, white grape, white cranberry  Lemonade Clear bullion, consomm, broth Carbonated beverages (any kind) Strained chicken noodle soup Hard Candy                             4.  In the morning, mix first dose of MoviPrep solution:    Empty 1 Pouch A and 1 Pouch B into the disposable container    Add lukewarm drinking water to the top line of the container. Mix to dissolve    Refrigerate (mixed solution should be used within 24 hrs)  5.  Begin drinking the prep at 5:00 p.m. The MoviPrep container is divided by 4 marks.   Every 15 minutes drink the solution down to the next mark (approximately 8 oz) until the full liter is complete.   6.  Follow completed prep with 16 oz of clear liquid of your choice  (Nothing red or purple).  Continue to drink clear liquids until bedtime.  7.  Before going to bed, mix second dose of MoviPrep solution:    Empty 1 Pouch A and 1 Pouch B into the disposable container    Add lukewarm drinking water to the top line of the container. Mix to dissolve    Refrigerate  THE DAY OF YOUR PROCEDURE      DATE: 12/25/10 DAY: Tuesday  Beginning at 9:30 a.m. (5 hours before procedure):         1. Every 15 minutes, drink the solution down to the next mark (approx 8 oz) until the full liter is complete.  2. Follow completed prep with 16 oz. of clear liquid of your choice.    3. You may drink clear liquids until 12:30pm (2 HOURS BEFORE PROCEDURE).   MEDICATION INSTRUCTIONS  Unless otherwise instructed, you should take regular prescription medications with a small sip of water   as early as possible the morning of your  procedure.          OTHER INSTRUCTIONS  You will need a responsible adult at least 71 years of age to accompany you and drive you home.   This person must remain in the waiting room during your procedure.  Wear loose fitting clothing that is easily removed.  Leave jewelry and other valuables at home.  However, you may wish to bring a book to read or  an iPod/MP3 player to listen to music as you wait for your procedure to start.  Remove all body piercing jewelry and leave at home.  Total time from sign-in until discharge is approximately 2-3 hours.  You should go home directly after your procedure and rest.  You can resume normal activities the  day after your procedure.  The day of your procedure you should not:   Drive   Make legal decisions   Operate machinery   Drink alcohol   Return to work  You will receive specific instructions about eating, activities and medications before you leave.    The above instructions have been reviewed and explained to me by   Marchelle Folks.     I fully understand and can verbalize these  instructions _____________________________ Date _________

## 2010-12-09 HISTORY — PX: OTHER SURGICAL HISTORY: SHX169

## 2010-12-15 LAB — CARDIAC PANEL(CRET KIN+CKTOT+MB+TROPI)
CK, MB: 2.4 ng/mL (ref 0.3–4.0)
CK, MB: 2.7 ng/mL (ref 0.3–4.0)
Relative Index: INVALID (ref 0.0–2.5)
Relative Index: INVALID (ref 0.0–2.5)
Relative Index: INVALID (ref 0.0–2.5)
Total CK: 78 U/L (ref 7–177)
Total CK: 85 U/L (ref 7–177)
Total CK: 90 U/L (ref 7–177)
Troponin I: 0.02 ng/mL (ref 0.00–0.06)
Troponin I: 0.02 ng/mL (ref 0.00–0.06)

## 2010-12-15 LAB — CBC
MCHC: 34.7 g/dL (ref 30.0–36.0)
Platelets: 289 10*3/uL (ref 150–400)
RBC: 4.08 MIL/uL (ref 3.87–5.11)

## 2010-12-15 LAB — COMPREHENSIVE METABOLIC PANEL
Albumin: 4.1 g/dL (ref 3.5–5.2)
Alkaline Phosphatase: 61 U/L (ref 39–117)
BUN: 14 mg/dL (ref 6–23)
Chloride: 93 mEq/L — ABNORMAL LOW (ref 96–112)
Creatinine, Ser: 0.74 mg/dL (ref 0.4–1.2)
Glucose, Bld: 100 mg/dL — ABNORMAL HIGH (ref 70–99)
Potassium: 3.6 mEq/L (ref 3.5–5.1)
Total Bilirubin: 0.9 mg/dL (ref 0.3–1.2)

## 2010-12-15 LAB — LIPID PANEL
LDL Cholesterol: 153 mg/dL — ABNORMAL HIGH (ref 0–99)
Total CHOL/HDL Ratio: 2.6 RATIO
VLDL: 15 mg/dL (ref 0–40)

## 2010-12-24 ENCOUNTER — Encounter: Payer: Self-pay | Admitting: Gastroenterology

## 2010-12-24 LAB — BASIC METABOLIC PANEL
BUN: 10 mg/dL (ref 6–23)
Calcium: 9.3 mg/dL (ref 8.4–10.5)
Creatinine, Ser: 0.71 mg/dL (ref 0.4–1.2)
GFR calc non Af Amer: >60 mL/min (ref >60)
Glucose, Bld: 107 mg/dL — ABNORMAL HIGH (ref 70–99)

## 2010-12-25 ENCOUNTER — Encounter: Payer: Self-pay | Admitting: Gastroenterology

## 2010-12-25 ENCOUNTER — Ambulatory Visit (AMBULATORY_SURGERY_CENTER): Payer: Medicare Other | Admitting: Gastroenterology

## 2010-12-25 DIAGNOSIS — K219 Gastro-esophageal reflux disease without esophagitis: Secondary | ICD-10-CM

## 2010-12-25 DIAGNOSIS — D509 Iron deficiency anemia, unspecified: Secondary | ICD-10-CM

## 2010-12-25 DIAGNOSIS — R1013 Epigastric pain: Secondary | ICD-10-CM

## 2010-12-25 DIAGNOSIS — K6289 Other specified diseases of anus and rectum: Secondary | ICD-10-CM

## 2010-12-25 DIAGNOSIS — K5730 Diverticulosis of large intestine without perforation or abscess without bleeding: Secondary | ICD-10-CM

## 2010-12-25 DIAGNOSIS — K222 Esophageal obstruction: Secondary | ICD-10-CM

## 2010-12-25 DIAGNOSIS — K449 Diaphragmatic hernia without obstruction or gangrene: Secondary | ICD-10-CM

## 2010-12-25 MED ORDER — FERROUS SULFATE 325 (65 FE) MG PO TBEC: 325.0000 mg | DELAYED_RELEASE_TABLET | Freq: Two times a day (BID) | ORAL | Status: DC

## 2010-12-25 MED ORDER — SODIUM CHLORIDE 0.9 % IV SOLN: 500.0000 mL | INTRAVENOUS | Status: DC

## 2010-12-25 NOTE — Progress Notes (Signed)
Patient passing flatus throughout recovery period. Patient stating abd.discomfort mainly mid upper abd. Called Dr. Russella Dar, who came to exam pt.Statingwarm fluids and ambulation would help. Additional Levsin given on MD order at 1638. Pt then stating she felt well enough to discharge. Gait is steady, skin warm and dry, vital signs stable. Pt. Verbalized understanding to call with any concerns or increased pain.

## 2010-12-25 NOTE — Patient Instructions (Signed)
Please purchase ferrous sulfate 325 mg ec tablets over the counter.  Take 1 tablet twice a day. Follow discharge instructions. Resume previous medications. High fiber diet with liberal fluid intake.

## 2010-12-26 ENCOUNTER — Telehealth: Payer: Self-pay | Admitting: *Deleted

## 2010-12-26 NOTE — Telephone Encounter (Signed)
LEFT MESSAGE ON VOICE MAIL THAT IDENTIFIES PATIENT BY FIRST AND LAST NAME TO CALL IF PROBLEMS OR CONCERNS

## 2010-12-31 ENCOUNTER — Encounter: Payer: Self-pay | Admitting: Gastroenterology

## 2011-01-04 ENCOUNTER — Telehealth: Payer: Self-pay | Admitting: *Deleted

## 2011-01-04 DIAGNOSIS — D649 Anemia, unspecified: Secondary | ICD-10-CM

## 2011-01-04 NOTE — Telephone Encounter (Signed)
1. Pt rec results from colon & egd by Dr Russella Dar - hiatal hernia and mild changes from acid reflux. No bleeding. FYI 2. Russella Dar started pt on Iron 325 bid. Last labs where 3/15, when does she need to be re-checked? 3. Needs 3 rfs to go to [facility] - omeprazole 40mg  bid, Tramadol 50mg  and Diovan. OK? If yes, I need qty for tramadol.

## 2011-01-04 NOTE — Telephone Encounter (Signed)
1. GI results noted 2. Lab: CBC, Iron June 15 orders entered 3. Ok for refills: tramadol 1 or 2 qid, #160, 2 refills

## 2011-01-07 MED ORDER — OMEPRAZOLE 40 MG PO CPDR: 40.0000 mg | DELAYED_RELEASE_CAPSULE | Freq: Every day | ORAL | Status: DC

## 2011-01-07 MED ORDER — TRAMADOL HCL 50 MG PO TABS: 50.0000 mg | ORAL_TABLET | Freq: Four times a day (QID) | ORAL | Status: DC | PRN

## 2011-01-07 MED ORDER — VALSARTAN 80 MG PO TABS: 80.0000 mg | ORAL_TABLET | Freq: Every day | ORAL | Status: DC

## 2011-01-07 NOTE — Telephone Encounter (Signed)
Rx's sent, pt aware. She wanted to know - if GI did not find source of low iron, what is cause? Does MD want to check any additional labs in June also?

## 2011-01-08 NOTE — Telephone Encounter (Signed)
Pt was curious to know the original cause of low iron level?

## 2011-01-08 NOTE — Telephone Encounter (Signed)
No additional studies except for lab ordered for June

## 2011-01-22 NOTE — Consult Note (Signed)
Wendy Love, Wendy Love           ACCOUNT NO.:  [id number]   MEDICAL RECORD NO.:  [id number]          PATIENT TYPE:  OBV   LOCATION:  3739                         FACILITY:  [facility]   PHYSICIAN:  Madolyn Frieze. Jens Som, MD, FACCDATE OF BIRTH:  09/22/1940   DATE OF CONSULTATION:  04/20/2009  DATE OF DISCHARGE:  04/20/2009                                 CONSULTATION   PRIMARY CARDIOLOGIST (NEW):  Madolyn Frieze. Jens Som, MD, [facility]   PRIMARY CARE PHYSICIAN:  Rosalyn Gess. Norins, MD   REASON FOR CONSULTATION:  Chest pain.   HISTORY OF PRESENT ILLNESS:  Wendy Love is a 70 year old Caucasian  female with no known history of CAD and a negative treadmill Myoview in  2007, but history significant for hypertension, anxiety, chronic back  pain with frequent use of high-dose NSAIDs, as well as a family history  significant for a sister with an MI in her late 45s and father with MI  in his 89s, presenting with chest heaviness.  The patient described 4/10  chest heaviness with radiation to the left upper extremity after getting  up from the couch and walking into her kitchen.  Pain was associated  with nausea, diaphoresis, tachy palpitations, mild presyncope without  frank syncope, and minimal shortness of breath.  The patient took 4  sublingual nitroglycerin and rested and pain resolved after  approximately 30-40 minutes.  She had difficulty sleeping, but when she  woke the next morning, she was very fatigued and called her primary care  physician for an appointment.  She saw him and he sent her for a direct  admission to [facility] for further eval.  On arrival, the  patient's BP was 142/84, heart rate 74, O2 sats 97% on room air, and EKG  without acute changes.  Cardiac enzymes have been negative x3.   PAST MEDICAL HISTORY:  1. Hypertension.  2. Hypothyroidism.  3. Anxiety.  4. Chronic back pain (on frequent high-dose ibuprofen).   SOCIAL HISTORY:  The patient lives in [place] with her  husband.  She  is a retired Audiological scientist.  She has no smoking or  illicit drug use history.  She rarely consumes alcohol, having 1 or 2  glasses of wine from time to time less than on a weekly basis.   She takes several herbal medications including a multivitamin, 2  different types of vitamin B, vitamin C, vitamin D, CoQ10, calcium, and  magnesium as well as 2 g of fish oil daily.   She has a heart healthy diet and walks approximately 30 minutes usually  on a daily basis without symptoms.   FAMILY HISTORY:  Mother living in her 94s with hypertension and stage IV  ovarian cancer.  Father deceased at age 8 with diabetes mellitus and  positive for MI in his 75s.  Two sisters, 1 with an MI in her 30s.   REVIEW OF SYSTEMS:  The patient has chronic back pain and had increased  frequency of urination on the morning of April 19, 2009, otherwise see  HPI.  All other systems reviewed and were negative.  CODE STATUS:  Full.   ALLERGIES:  NKDA.   MEDICATIONS:  1. Hydrochlorothiazide 25 mg p.o. daily.  2. Levothyroxine 25 mcg p.o. daily.  3. Ibuprofen 800 mg t.i.d. p.r.n.  4. Lisinopril 40 mg p.o. daily.  5. Temazepam 15 mg p.o. nightly.  6. Robaxin 375 mg t.i.d.  7. Ranitidine 150 mg p.o. b.i.d.  8. Oxycodone 2.5 mg p.o. nightly.   PHYSICAL EXAMINATION:  VITAL SIGNS:  Temperature 97.9 degrees  Fahrenheit, BP 142/84, pulse 75, respirations 16, O2 saturation 99% on  room air, weight 59.7 kg.  GENERAL:  The patient is alert and oriented x3 in no apparent distress.  Able to move and speak easily without any respiratory distress.  HEENT:  Her head is normocephalic.  Pupils equal, round, reactive to  light.  Extraocular muscles are intact.  Nares are patent without  discharge.  Oropharynx without erythema or exudates.  NECK:  Supple without lymphadenopathy.  No JVD, no thyromegaly, no  bruits.  HEART:  Heart rate is regular with audible S1 and S2.  No clicks,  rubs,  murmurs, or gallops.  Pulses are 2+ and equal in both upper and lower  extremities bilaterally.  LUNGS:  Clear to auscultation bilaterally.  SKIN:  No rashes, lesions, or petechiae.  ABDOMEN:  Soft, nontender, nondistended.  Normal abdominal bowel sounds.  No rebound or guarding.  No hepatosplenomegaly.  No pulsations.  EXTREMITIES:  No clubbing, cyanosis, or edema.  MUSCULOSKELETAL:  No joint deformity or effusions.  No spinal or CVA  tenderness.  NEUROLOGIC:  Cranial nerves II through XII are grossly intact.  Strength  is 5/5 in all extremities and axial groups.  Normal sensation throughout  and normal cerebellar function.   RADIOLOGY:  The patient had a CTA in March 2007, that showed no evidence  of obstructive coronary artery disease.  Main vessels and distal  branches adequately assessed by coronary CTA.  No anomalies identified,  LV function at rest is normal with EF estimated at 56%.   EKG; normal sinus rhythm, rate 76.  No acute ST-T-wave changes, no  significant Q-waves, normal axis, no evidence of hypertrophy.  Intervals  within normal limits.  No prior tracing for comparison.   LABORATORY DATA:  WBC 6.5, HGB 13.6, HCT 39.3, PLT count 289.  Sodium  127, potassium 3.6, chloride 93, CO2 25, BUN 14, creatinine 0.74.  Liver  function tests within normal limits.  Albumin 4.1, calcium 9.2.  Cardiac  enzymes negative x3.  Total cholesterol 272, triglycerides 75, HDL 104,  LDL 153, VLDL 15.   ASSESSMENT AND PLAN:  Wendy Love is a 70 year old Caucasian female with  past medical history listed above, presenting with chest heaviness,  radiating to the left arm and associated with nausea, diaphoresis, tachy  palpitations, lightheadedness, and minimal shortness of breath lasting  approximately 30 minutes.  The patient denies pleuritic chest pain.  She  does report being stressed over the last few months regarding her mother  who has end-stage ovarian cancer.  The patient has  been asymptomatic for  the last 36 hours and denies any heart failure symptoms like dyspnea on  exertion, orthopnea, paroxysmal nocturnal dyspnea, lower extremity  swelling.  Enzymes are negative.  EKG  without acute ST-T-wave changes.  Plan for treadmill stress Myoview  today in the office (right now, she is being discharged to our office  for study) and we will plan home if normal and cardiac cath if abnormal.  Continue aspirin.  Hold atenolol  this morning.  Thank you for the  consult.      Jarrett Ables, PAC      Madolyn Frieze. Jens Som, MD, [facility]  Electronically Signed    MS/MEDQ  D:  04/20/2009  T:  04/20/2009  Job:  [id number]

## 2011-01-22 NOTE — Op Note (Signed)
NAMEMACON, LESESNE           ACCOUNT NO.:  [id number]   MEDICAL RECORD NO.:  [id number]          PATIENT TYPE:  AMB   LOCATION:  DSC                          FACILITY:  [facility]   PHYSICIAN:  Katy Fitch. Sypher, M.D. DATE OF BIRTH:  [birth year]   DATE OF PROCEDURE:  07/19/2008  DATE OF DISCHARGE:                               OPERATIVE REPORT   PREOPERATIVE DIAGNOSIS:  Entrapment neuropathy, median nerve, right  carpal tunnel, and right thumb stenosing tenosynovitis at A1 pulley.   OPERATIONS:  1. Release of right transverse carpal ligament.  2. Release of right thumb A1 pulley.   OPERATING SURGEON:  Katy Fitch. Sypher, MD   ASSISTANT:  Marveen Reeks Dasnoit, PA-C   ANESTHESIA:  General by LMA.   SUPERVISING ANESTHESIOLOGIST:  Germaine Pomfret, MD   INDICATIONS:  Wendy Love is a 70 year old woman referred for  evaluation and management of hand numbness and a right trigger thumb.   She has failed nonoperative measures for both predicaments.  Electrodiagnostic studies confirmed significant right median neuropathy  at the wrist level.   After informed consent, she is brought to the operating room at this  time.   PROCEDURE IN DETAIL:  Verlee Monte is brought to the operating  room and placed in a supine position upon the operating table.   Following induction of general anesthesia by LMA technique, the right  arm was prepped with Betadine soap and solution and sterilely draped.  A  pneumatic tourniquet was applied to the proximal brachium.   Following exsanguination of the right arm with Esmarch bandage, the  arterial tourniquet was inflated to 220 mmHg.  Procedure commenced with  a short incision directly over the palpably thickened A1 pulley of the  right thumb.  Subcutaneous tissues were carefully divided taking care to  identify the flexor sheath and gently retract the radial proper digital  nerve.   The flexor sheath was incised with a scalpel and the  scissors releasing  the A1 pulley.  The flexor pollicis longus tendon was delivered and  found be swollen, but otherwise normal.   Thereafter, full active range of motion of the IP joint was recovered.   This wound was then repaired with intradermal 3-0 Prolene suture.   Attention was then directed to the mid palm where in the line of the  ring finger a short incision was fashioned.  The palmar fascia was  revealed and split in line of its fibers.  The common sensory branch of  the median nerve was identified and followed back to the median nerve  proper.  The median nerve was separated from the deep surface of the  transverse carpal ligament.  The ligaments were released along its ulnar  border extending into the distal forearm.   This widely opened the carpal canal.  No masses or predicaments were  noted.   Bleeding points along the margin of the released ligament were  electrocauterized with bipolar current followed by repair of the skin  with intradermal 3-0 Prolene suture.   A compressive dressing was applied with a volar plaster splint  maintaining the wrist in  5 degrees of dorsiflexion.      Katy Fitch Sypher, M.D.  Electronically Signed     RVS/MEDQ  D:  07/19/2008  T:  07/20/2008  Job:  [id number]

## 2011-01-22 NOTE — Op Note (Signed)
NAMEMELANNI, BENWAY           ACCOUNT NO.:  [id number]   MEDICAL RECORD NO.:  [id number]          PATIENT TYPE:  AMB   LOCATION:  DSC                          FACILITY:  [facility]   PHYSICIAN:  Katy Fitch. Sypher, M.D. DATE OF BIRTH:  [birth year]   DATE OF PROCEDURE:  09/16/2008  DATE OF DISCHARGE:                               OPERATIVE REPORT   PREOPERATIVE DIAGNOSIS:  Entrapment neuropathy, median nerve left carpal  tunnel.   POSTOPERATIVE DIAGNOSIS:  Entrapment neuropathy, median nerve left  carpal tunnel.   OPERATION:  Release of left transverse carpal ligament.   OPERATING SURGEON:  Katy Fitch. Sypher, MD   ASSISTANT:  Marveen Reeks Dasnoit, PA-C   ANESTHESIA:  General by LMA.   SUPERVISING ANESTHESIOLOGIST:  Zenon Mayo, MD   INDICATIONS:  Wendy Love is a 67-year woman referred for  evaluation and management of left hand numbness.  Clinical examination  revealed signs of carpal tunnel syndrome.  Electrodiagnostic studies  revealed significant entrapment neuropathy.   Due to a failed response to nonoperative measures, she is brought to the  operating room at this time for release of left transverse carpal  ligament.   PROCEDURE IN DETAIL:  Wendy Love is brought to the operating  room and placed in supine position upon the operating table.  Following  induction of general anesthesia by LMA technique, the left arm was  prepped with Betadine soap and solution and sterilely draped.  Following  exsanguination of left arm with Esmarch bandage, the arterial tourniquet  was inflated to 250 mmHg.  Procedure commenced with a short incision in  the line of the ring finger in the palm.  Subcutaneous tissues were  carefully divided taking care to identify the palmar fascia.  This was  split in line of its fibers to reveal the common sensory branch of the  median nerve.  These were followed back to the median nerve proper.  Care was taken to search for the motor  branch.  There was a thenar  muscle variant that extended across the hypothenar muscles.  The muscle  fibers were teased apart followed by release of the transverse carpal  ligament along its ulnar border extending into the distal forearm.   This widely opened the carpal canal.  No mass or predicaments were  noted.  Bleeding points along the margin of the released ligament were  electrocauterized with bipolar current.   The wound was then repaired with intradermal 3-0 Prolene.  A compressive  dressing was applied with volar plaster splint maintaining the wrist in  5 degrees of dorsiflexion.   For aftercare, Wendy Love is provided a prescription for Dilaudid 2 mg  one to two tablets p.o. q.4-6 h. p.r.n. pain, 20 tablets without refill.   We will see her back in followup in the office in 1 week or sooner  p.r.n. problems.      Katy Fitch Sypher, M.D.  Electronically Signed     RVS/MEDQ  D:  09/16/2008  T:  09/16/2008  Job:  [id number]

## 2011-01-25 NOTE — Assessment & Plan Note (Signed)
[facility]                           PRIMARY CARE OFFICE NOTE   NAME:OREILLYHaani, Bakula                  MRN:          [id number]  DATE:11/18/2006                            DOB:          1940/09/30    Ms. Inabinet is a 70 year old woman who presents for a followup  evaluation and exam.  She has been followed chronically for  hypertension.   The patient was last seen in the office on June 11, 2006, followed for  hypertension.  Please see that handwritten note.   INTERVAL HISTORY:  The patient has had ongoing stress, see below.  She  has had some sinus drainage and headache with cough that is dry and  hacking but is nonproductive.  The patient reports her blood pressures  have been well controlled at home, generally running in the 130s to low  140s but as high as 162 systolic, generally in the 70s to low 80s with  occasional bumps to 104 in the diastolic.   The patient is concerned about a changing second toe of her right foot  with increasing hook deformity.   PAST MEDICAL HISTORY:   SURGICAL:  Hysterectomy with oophorectomy in 2000, with an iatrogenic  sigmoid colon injury.  She did make a good recovery.  No other surgeries  are noted.   MEDICAL:  1. The patient had the usual childhood diseases.  2. Hypothyroid disease.  3. Hypertension.  4. Mild depression.  5. Carpal tunnel disease.  6. Degenerative disk disease of the cervical spine.   CURRENT MEDICATIONS:  1. HCTZ 12.5 mg daily.  2. Synthroid 25 mcg daily.  3. The patient takes calcium.  4. Vitamin c.  5. Zinc.  6. Garlic.  7. Estrone.  8. CQ-10.  9. Cenestin.  10.Prometrium.  11.Fish oil.  12.Aspirin 325 mg daily.  13.Ibuprofen 400 mg q.8 h. p.r.n.   FAMILY HISTORY:  Positive for insulin-dependent diabetes in her father.  Positive for a CVA in her father.  Positive for severe arthritis and  hypertension in her mother.  Positive for coronary artery disease in one  sister who had an MI at age 40.  Positive for cardiovascular in general,  hypertension and diabetes.   SOCIAL HISTORY:  The patient was married for 6 years, divorced, single  for 10 years.  The patient has been remarried for 26 years.  The  patient's husband has had coronary artery disease with bypass grafting.  He has made a good recovery and remains active.  The patient's mother  has been very ill, currently undergoing a third round of chemotherapy  for ovarian CA and is somewhat debilitated by her treatment.  She has a  son who has had recurrent bladder cancer, undergoing ongoing treatment,  so there is a fair amount of stress at this time in her life.   REVIEW OF SYSTEMS:  Negative for constitutional, ophthalmology,  cardiovascular, or GI complaints.   PHYSICAL EXAMINATION:  VITAL SIGNS:  Temperature 97.1, blood pressure  188/93, pulse 82, weight 138.  GENERAL APPEARANCE:  This is a well nourished well developed woman who  looks younger than  her stated chronologic age.  She is in no acute  distress.  HEENT:  Normocephalic atraumatic.  EACs and TMs remarkable for cerumen  impaction on the left which irrigated without difficulty.  Oropharynx  without lesions.  Good dentition is noted.  Posterior pharynx is clear.  Conjunctivae and sclerae are clear.  Pupils are equal, round, and  reactive to light and accommodation.  Funduscopic exam deferred to  ophthalmology.  NECK:  Supple without thyromegaly.  NODES:  No adenopathy was noted in the submandibular, cervical regions.  CHEST:  No CVA tenderness.  No deformities are noted.  LUNGS:  Clear with no rales, wheezes, or rhonchi.  No increased work-of-  breathing.  BREASTS:  Deferred to gynecology.  CARDIOVASCULAR:  With 2 plus radial pulse.  No JVD or carotid bruits.  She had a quiet precordium with a regular rate and rhythm without  murmurs, rubs, or gallops.  ABDOMEN:  Soft.  No guarding.  No rebound.  No organomegaly was noted.   PELVIC:  Deferred to gynecology.  RECTAL:  Deferred to gynecology.  EXTREMITIES:  Without clubbing, cyanosis, edema, or deformity.  NEUROLOGIC:  Nonfocal.   CHART REVIEW:  Last colonoscopy was May 2003, and it was a normal study.  And, she should not need a further study until 2013.   The patient had a nuclear stress test, October 17, 2005, read out as a  negative study with low risk with no clear cut evidence of any coronary  artery obstruction or disease.   The patient had a cardiac CT and coronary CT angiography November 21, 2005.  Read out as not evidence of obstructive coronary disease.  Left  ventricular function at rest was normal with an estimated EF of 56%.   The patient's last bone densitometry study, April 11, 2005, showed  normal bone density of the lumbar spine and femoral neck.  The patient  had an x-ray of the bilateral SI joints which was negative for any  significant degenerative disease.  Lumbosacral spine series likewise  from December 28, 2004, was a normal study.   LABORATORY:  TSH was normal at 4.35.  Basic metabolic panel was normal  with a glucose of 94, sodium and potassium were normal.  Kidney function  was normal with a creatinine of 0.7 and a GFR of 89.  The patient's last  lipid panel was from November 15, 2005, and was normal with an LDL of 104.9  and an HDL of 102.3.  Triglycerides were 90.  There was no need to  repeat this study.   ASSESSMENT/PLAN:  1. Hypertension. The patient has a listing of blood pressure readings      from home with very slightly elevated systolic values and mildly      elevated diastolic values.  Our plan is to increase the patient's      hydrochlorothiazide to 25 mg daily and follow up with repeat home      monitoring.  2. Hypothyroid disease.  The patient is very stable on her present      dose of medication with a very adequate TSH as noted. 3. Health maintenance.  The patient is current with Dr. Aldona Bar with an      examination in  the Fall of 2007.  She also had a mammogram in the      Fall of 2007, that was normal.  She has had current colorectal      cancer screening as noted.  The patient has had adequate  screening      for coronary artery disease which was negative.  4. The patient does carry quite a bit of stress at this time but she      does seem to be coping well and does not need any additional      medical intervention for these problems at this time.   SUMMARY:  A very pleasant patient who seems to be medically stable.  Her  blood pressure and thyroid disease are well controlled.  She is asked to  return to see me in 1 years or on a p.r.n. basis.     Rosalyn Gess Norins, MD  Electronically Signed    MEN/MedQ  DD: 11/18/2006  DT: 11/20/2006  Job #: [id number]   cc:   Jacqualyn Posey, Ms.

## 2011-01-25 NOTE — Discharge Summary (Signed)
Wendy Love, Wendy Love           ACCOUNT NO.:  [id number]   MEDICAL RECORD NO.:  [id number]          PATIENT TYPE:  OBV   LOCATION:  3739                         FACILITY:  [facility]   PHYSICIAN:  Rosalyn Gess. Norins, MD  DATE OF BIRTH:  Dec 23, 1940   DATE OF ADMISSION:  04/19/2009  DATE OF DISCHARGE:  04/20/2009                               DISCHARGE SUMMARY   ADMITTING DIAGNOSIS:  Chest pain, rule out myocardial infarction.   DISCHARGE DIAGNOSIS:  Myocardial infarction ruled out.   CONSULTANTS:  Madolyn Frieze. Jens Som, MD, [facility] for the Cardiology Service.   PROCEDURES:  None.   HISTORY OF PRESENT ILLNESS:  The patient presented to the office on the  day of admission reporting an episode of substernal chest pain,  described as heavy pressure on her chest with diaphoresis, nausea,  radiation of pain to both arms.  The duration was about 45 minutes.  She  took four baby aspirin and one pain pill and was able to get relief  about 3:30 in the morning.  On the morning of admission, she had  awakened with weakness in both arms and recurrent diaphoresis.  Because  of significant cardiac risk factor, she was admitted to rule out for MI.  Please see EMR generated H and P for past medical history, family  history, social history, and physical exam at admission.   HOSPITAL COURSE:  The patient was admitted to a telemetry unit.  She had  cardiac enzymes cycled, which revealed CK that was 90 and 78 with a  troponin-I that was 0.03 and 0.02.  The patient was seen in consultation  by the Cardiology Service.  They felt that her cardiac enzymes being  negative.  She was able to be discharged home to have a followup  outpatient Myoview nuclear stress test.  The patient was seen in the  morning of day of discharge at 0700 hours, at which time, she had a  temperature of 97.9, blood pressure 142/84, heart rate 75, respirations  were 16, O2 sat 99%.   PHYSICAL EXAMINATION:  GENERAL APPEARANCE:  This is a  pleasant woman in  no distress.  CHEST:  Clear.  CARDIOVASCULAR:  2+ radial pulse and regular rate and rhythm without  murmurs.   She was discharged home for outpatient Myoview.      Rosalyn Gess Norins, MD  Electronically Signed     MEN/MEDQ  D:  05/07/2009  T:  05/08/2009  Job:  [id number]

## 2011-02-19 ENCOUNTER — Telehealth: Payer: Self-pay | Admitting: *Deleted

## 2011-02-19 DIAGNOSIS — E039 Hypothyroidism, unspecified: Secondary | ICD-10-CM

## 2011-02-19 NOTE — Telephone Encounter (Signed)
Patient informed, TSH added. She would like lab letter w/MD's recommendations w/copy of results attached when they are ready.

## 2011-02-19 NOTE — Telephone Encounter (Signed)
Pt has lab orders for CBC & IBC, She would like TSH checked to ensure she is on correct dose of med. OK?

## 2011-02-19 NOTE — Telephone Encounter (Signed)
OK to add TSH 

## 2011-02-22 ENCOUNTER — Other Ambulatory Visit (INDEPENDENT_AMBULATORY_CARE_PROVIDER_SITE_OTHER): Payer: Medicare Other

## 2011-02-22 DIAGNOSIS — D649 Anemia, unspecified: Secondary | ICD-10-CM

## 2011-02-22 DIAGNOSIS — E039 Hypothyroidism, unspecified: Secondary | ICD-10-CM

## 2011-02-22 LAB — CBC WITH DIFFERENTIAL/PLATELET
Basophils Relative: 0.4 % (ref 0.0–3.0)
Eosinophils Absolute: 0.1 10*3/uL (ref 0.0–0.7)
MCHC: 34.5 g/dL (ref 30.0–36.0)
MCV: 90.5 fl (ref 78.0–100.0)
Monocytes Absolute: 0.5 10*3/uL (ref 0.1–1.0)
Neutrophils Relative %: 63.3 % (ref 43.0–77.0)
RBC: 4.36 Mil/uL (ref 3.87–5.11)

## 2011-02-22 LAB — IBC PANEL: Transferrin: 231.5 mg/dL (ref 212.0–360.0)

## 2011-03-04 ENCOUNTER — Encounter: Payer: Self-pay | Admitting: Internal Medicine

## 2011-03-27 ENCOUNTER — Telehealth: Payer: Self-pay | Admitting: *Deleted

## 2011-03-27 DIAGNOSIS — M5430 Sciatica, unspecified side: Secondary | ICD-10-CM

## 2011-03-27 NOTE — Telephone Encounter (Signed)
Pt is requesting a referral for pain management for back and leg pain-MEN pt

## 2011-03-27 NOTE — Telephone Encounter (Signed)
Pt informed-transferred to scheduler per pt to see if she can see Dr. Debby Bud sooner.

## 2011-03-27 NOTE — Telephone Encounter (Signed)
done

## 2011-04-11 ENCOUNTER — Ambulatory Visit (INDEPENDENT_AMBULATORY_CARE_PROVIDER_SITE_OTHER): Payer: Medicare Other | Admitting: Internal Medicine

## 2011-04-11 VITALS — BP 148/96 | HR 69 | Temp 98.1°F | Wt 135.0 lb

## 2011-04-11 DIAGNOSIS — E039 Hypothyroidism, unspecified: Secondary | ICD-10-CM

## 2011-04-11 DIAGNOSIS — M5430 Sciatica, unspecified side: Secondary | ICD-10-CM

## 2011-04-11 DIAGNOSIS — I10 Essential (primary) hypertension: Secondary | ICD-10-CM

## 2011-04-11 DIAGNOSIS — G4700 Insomnia, unspecified: Secondary | ICD-10-CM

## 2011-04-11 MED ORDER — ZOLPIDEM TARTRATE 5 MG PO TABS: 5.0000 mg | ORAL_TABLET | Freq: Every evening | ORAL | Status: DC | PRN

## 2011-04-11 NOTE — Assessment & Plan Note (Signed)
Progressive pain.  Plan - refer to Dr. Murray Hodgkins- PMR at [facility]

## 2011-04-11 NOTE — Assessment & Plan Note (Signed)
Patient wishes to switch back to zolpidem for sleep.  Plan - 90 day Rx for zolpidem 5 mg 1 qhs

## 2011-04-11 NOTE — Assessment & Plan Note (Signed)
Good control by home records. She wants a drug holiday from HCTZ  Plan - stop HCTZ           Monitor BP watching for rise in SBP

## 2011-04-11 NOTE — Assessment & Plan Note (Signed)
Lab Results  Component Value Date   TSH 2.84 02/22/2011   Reviewed with patient. She should stay on the present dose of replacement

## 2011-04-13 NOTE — Progress Notes (Signed)
  Subjective:    Patient ID: Wendy Love, female    DOB: April 07, 1941, 70 y.o.   MRN: [id number]  HPI Mrs. zylka is well known to the practice. She presents today with a request for referral to Dr. Murray Hodgkins for pain consultation and management due to persistent and progressive nature of her sciatic type pain despite surgery x 2. She is able to manage her ADLs, including driving.  Her blood pressure has been well controlled and she would like to stop the HCTZ if possible.  She reports that temazepam is not working well for her insomnia symptoms and she would like a trial of zolpidem.  I have reviewed the patient's medical history in detail and updated the computerized patient record.    Review of Systems Review of Systems  Constitutional:  Negative for fever, chills, activity change and unexpected weight change.  HEENT:  Negative for hearing loss, ear pain, congestion, neck stiffness and postnasal drip. Negative for sore throat or swallowing problems. Negative for dental complaints.   Eyes: Negative for vision loss or change in visual acuity.  Respiratory: Negative for chest tightness and wheezing.   Cardiovascular: Negative for chest pain and palpitation. No decreased exercise tolerance Gastrointestinal: No change in bowel habit. No bloating or gas. No reflux or indigestion Genitourinary: Negative for urgency, frequency, flank pain and difficulty urinating.  Musculoskeletal: Negative for myalgias, back pain, arthralgias and gait problem.  Neurological: Negative for dizziness, tremors, weakness and headaches.  Hematological: Negative for adenopathy.  Psychiatric/Behavioral: Negative for behavioral problems and dysphoric mood.       Objective:   Physical Exam Vitals noted - BP is suboptimally controlled Gen'l - WNWD white woman in nodistress HEENT C&S sclera is clear. Chest - no increased WOB, no wheezing Cor- RRR, 2+ radial pulse       Assessment & Plan:

## 2011-06-11 LAB — BASIC METABOLIC PANEL
CO2: 27
Calcium: 9.6
Chloride: 92 — ABNORMAL LOW
GFR calc Af Amer: >60
Sodium: 129 — ABNORMAL LOW

## 2011-07-11 ENCOUNTER — Telehealth: Payer: Self-pay | Admitting: *Deleted

## 2011-07-11 NOTE — Telephone Encounter (Signed)
Ok for temazepam 15 mg, '90 1 po qhs for sleep

## 2011-07-11 NOTE — Telephone Encounter (Signed)
Pt states Remus Loffler is not working any longer and she wants to go back on Temazepam 15mg  1 at bedtime. States the Remus Loffler is making her nervous and she is not resting. Requests #[id number] be sent to [facility]

## 2011-07-12 MED ORDER — TEMAZEPAM 15 MG PO CAPS: 15.0000 mg | ORAL_CAPSULE | Freq: Every evening | ORAL | Status: DC | PRN

## 2011-09-06 ENCOUNTER — Encounter: Payer: Self-pay | Admitting: Internal Medicine

## 2011-09-06 ENCOUNTER — Ambulatory Visit (INDEPENDENT_AMBULATORY_CARE_PROVIDER_SITE_OTHER): Payer: Medicare Other | Admitting: Internal Medicine

## 2011-09-06 VITALS — BP 140/82 | HR 88 | Temp 98.2°F

## 2011-09-06 DIAGNOSIS — J40 Bronchitis, not specified as acute or chronic: Secondary | ICD-10-CM

## 2011-09-06 DIAGNOSIS — J069 Acute upper respiratory infection, unspecified: Secondary | ICD-10-CM

## 2011-09-06 MED ORDER — HYDROCODONE-HOMATROPINE 5-1.5 MG/5ML PO SYRP: 5.0000 mL | ORAL_SOLUTION | Freq: Three times a day (TID) | ORAL | Status: AC | PRN

## 2011-09-06 MED ORDER — DOXYCYCLINE HYCLATE 100 MG PO TABS: 100.0000 mg | ORAL_TABLET | Freq: Two times a day (BID) | ORAL | Status: AC

## 2011-09-06 NOTE — Patient Instructions (Signed)
It was good to see you today. Doxycycline antibiotics 2/day for 1 week and cough syrup as needed - Your prescription(s) have been submitted to your pharmacy. Please take as directed and contact our office if you believe you are having problem(s) with the medication(s). Use tylenol for aches, pain and fever symptoms as discussed, keep hydrated and rest!

## 2011-09-06 NOTE — Progress Notes (Signed)
  Subjective:    HPI  complains of cold symptoms  Onset >1 week ago, wax/wane symptoms  Initially associated with rhinorrhea, sneezing, sore throat, mild headache and low grade fever Also myalgias, sinus pressure and mild-mod chest congestion No relief with OTC meds Precipitated by sick contacts  Past Medical History  Diagnosis Date  . Hypertension   . Hyperthyroidism   . Depression   . Anxiety   . Rhinosinusitis   . Sciatica   . Bronchitis     Review of Systems Constitutional: No night sweats, no unexpected weight change Pulmonary: No pleurisy or hemoptysis Cardiovascular: No chest pain or palpitations     Objective:   Physical Exam BP 140/82  Pulse 88  Temp(Src) 98.2 F (36.8 C) (Oral)  SpO2 98% GEN: mildly ill appearing and audible head/chest congestion with hoarseness HENT: NCAT, no sinus tenderness bilaterally, nares with clear discharge, oropharynx mild erythema, no exudate Eyes: Vision grossly intact, no conjunctivitis Lungs: Few rhonchi but then clears after cough no wheeze, no increased work of breathing Cardiovascular: Regular rate and rhythm, no bilateral edema      Assessment & Plan:  Viral URI >>bronchitis symptoms  Cough and postnasal drip related to above   Empiric antibiotics prescribed due to symptom duration greater than 7 days Prescription cough suppression - new prescriptions done Symptomatic care with Tylenol, hydration and rest -  salt gargle advised as needed

## 2011-09-27 ENCOUNTER — Telehealth: Payer: Self-pay | Admitting: *Deleted

## 2011-09-27 MED ORDER — LEVOTHYROXINE SODIUM 25 MCG PO TABS: 25.0000 ug | ORAL_TABLET | Freq: Every day | ORAL | Status: DC

## 2011-09-27 NOTE — Telephone Encounter (Signed)
Refill request levothyroxine .

## 2011-09-30 ENCOUNTER — Telehealth: Payer: Self-pay

## 2011-09-30 ENCOUNTER — Emergency Department: Payer: Self-pay | Admitting: *Deleted

## 2011-09-30 LAB — CK TOTAL AND CKMB (NOT AT ARMC)
CK, Total: 82 U/L (ref 21–215)
CK-MB: 1.5 ng/mL (ref 0.5–3.6)

## 2011-09-30 LAB — COMPREHENSIVE METABOLIC PANEL
Albumin: 4.3 g/dL (ref 3.4–5.0)
Alkaline Phosphatase: 77 U/L (ref 50–136)
Anion Gap: 15 (ref 7–16)
BUN: 9 mg/dL (ref 7–18)
Calcium, Total: 9.4 mg/dL (ref 8.5–10.1)
Co2: 26 mmol/L (ref 21–32)
EGFR (African American): >60
EGFR (Non-African Amer.): >60
Osmolality: 263 (ref 275–301)
SGOT(AST): 35 U/L (ref 15–37)
SGPT (ALT): 27 U/L
Sodium: 132 mmol/L — ABNORMAL LOW (ref 136–145)
Total Protein: 8.2 g/dL (ref 6.4–8.2)

## 2011-09-30 LAB — CBC
HGB: 14.9 g/dL (ref 12.0–16.0)
MCHC: 34.6 g/dL (ref 32.0–36.0)
MCV: 95 fL (ref 80–100)
Platelet: 233 10*3/uL (ref 150–440)
RBC: 4.53 10*6/uL (ref 3.80–5.20)
WBC: 5.5 10*3/uL (ref 3.6–11.0)

## 2011-09-30 LAB — TROPONIN I: Troponin-I: <0.02 ng/mL

## 2011-09-30 NOTE — Telephone Encounter (Signed)
Called patient - in ED [facility].

## 2011-09-30 NOTE — Telephone Encounter (Signed)
Patient has been having a headache, dizzines and increased heart rate (99) today. She was at the [facility] in [place] with her husband today they checked her BP and it was 179/111, Please advise call back number is [phone]

## 2011-10-01 ENCOUNTER — Telehealth: Payer: Self-pay

## 2011-10-01 DIAGNOSIS — E039 Hypothyroidism, unspecified: Secondary | ICD-10-CM

## 2011-10-01 NOTE — Telephone Encounter (Signed)
TSH of 12.2 is a mild elevation indicating mild hypothyroidism. This would not explain hypertension.  Plan - increase levothyroxine to 50 mcg daily, follow-up lab in 4 weeks (order entered).

## 2011-10-01 NOTE — Telephone Encounter (Signed)
The patient called to follow-up from 09/30/2011 phone call and ED visit at [facility]. The patient wanted to inform  MD that TSH result was 12.2. Please advise do you want the patient to followup with OV or continue with medications as presently doing? Call back number is [phone]

## 2011-10-02 NOTE — Telephone Encounter (Signed)
Patient informed of MD's instructions

## 2011-10-02 NOTE — Telephone Encounter (Signed)
Called left message to call back 

## 2011-10-09 ENCOUNTER — Telehealth: Payer: Self-pay | Admitting: Internal Medicine

## 2011-10-09 NOTE — Telephone Encounter (Signed)
Pt called to see if she can get in earlier than 3/4 for new pt appointment.  Pt stated she is a patient with dr Debby Bud and he can't see for 4 weeks.  She went to [facility] last week and her bp was very high 189/105 and pulse was 140 her tsh was 12.2. The er didn't find anything except the thryoid low.  Pt stated she call dr Debby Bud office he told her to double thryoid meds and he wanted labs done in 4 weeks. Please advise is she can be seen earlier

## 2011-10-10 NOTE — Telephone Encounter (Signed)
Called patient back and advised her to see if Dr. Debby Bud could work her in since he is her PCP, that we have no opening for new patient any sooner than the 3/4 appt that she already has scheduled.

## 2011-11-11 ENCOUNTER — Encounter: Payer: Self-pay | Admitting: Internal Medicine

## 2011-11-11 ENCOUNTER — Ambulatory Visit (INDEPENDENT_AMBULATORY_CARE_PROVIDER_SITE_OTHER): Payer: Medicare Other | Admitting: Internal Medicine

## 2011-11-11 VITALS — BP 142/88 | HR 94 | Temp 98.1°F | Resp 16 | Ht 63.0 in | Wt 128.0 lb

## 2011-11-11 DIAGNOSIS — I479 Paroxysmal tachycardia, unspecified: Secondary | ICD-10-CM

## 2011-11-11 DIAGNOSIS — E785 Hyperlipidemia, unspecified: Secondary | ICD-10-CM

## 2011-11-11 DIAGNOSIS — E039 Hypothyroidism, unspecified: Secondary | ICD-10-CM

## 2011-11-11 DIAGNOSIS — R002 Palpitations: Secondary | ICD-10-CM

## 2011-11-11 DIAGNOSIS — I10 Essential (primary) hypertension: Secondary | ICD-10-CM

## 2011-11-11 DIAGNOSIS — D509 Iron deficiency anemia, unspecified: Secondary | ICD-10-CM

## 2011-11-11 LAB — BASIC METABOLIC PANEL
BUN: 8 mg/dL (ref 6–23)
CO2: 29 mEq/L (ref 19–32)
Calcium: 9.8 mg/dL (ref 8.4–10.5)
GFR: 89.02 mL/min (ref >60.00)
Glucose, Bld: 90 mg/dL (ref 70–99)
Sodium: 132 mEq/L — ABNORMAL LOW (ref 135–145)

## 2011-11-11 MED ORDER — VALSARTAN 160 MG PO TABS: 160.0000 mg | ORAL_TABLET | Freq: Every day | ORAL | Status: DC

## 2011-11-11 MED ORDER — LEVOTHYROXINE SODIUM 25 MCG PO TABS: 25.0000 ug | ORAL_TABLET | Freq: Every day | ORAL | Status: DC

## 2011-11-11 MED ORDER — TEMAZEPAM 15 MG PO CAPS: 15.0000 mg | ORAL_CAPSULE | Freq: Every evening | ORAL | Status: DC | PRN

## 2011-11-11 NOTE — Assessment & Plan Note (Signed)
She has had a prior Holter monitor one to 2 years ago but has is having recurrent episodes which are quite symptomatic for her. Will refer to Dr. Lanae Boast for a 30 day CardioNet monitor. Checking electrolytes today as she did have some deficiencies during prior check. Thyroid function has been rejected and is within normal limits on current thyroid dose. No changes to thyroid medication to

## 2011-11-11 NOTE — Assessment & Plan Note (Signed)
Accompanied by facial flushing palpitations and rapid heart rate. It is unclear whether she has truly had a workup for pheochromocytoma in the past. She is currently taking hydrochlorothiazide which we did be stopped for one week prior to collection of 24-hour urine for catecholamines. We'll do this today. Additionally her he her labs done several weeks ago do suggest that the hydrochlorothiazide may be causing dehydration and hyponatremia which may be aggravating her arrhythmia. We will repeat her basic metabolic panel today consider an alternative for her blood pressure

## 2011-11-11 NOTE — Progress Notes (Signed)
Subjective:    Patient ID: Wendy Love, female    DOB: Aug 01, 1941, 71 y.o.   MRN: [id number]  HPI  Wendy Love is a delightful 71 year old white female with a history of seasonal rhinitis and hypertension who is transferring from Dr. Debby Bud for proximity and convenience.   For the last year has been having seasonal rhinitis and hacking cough for the past 6 to 8 week,s since Christmas.  Other issue is recent ER visit Jan 21 st for severe headache accompanied by severe hypertension 190/120 with pulse 140 .  Labwork showed hyponatremia, hypokalemia consistent with deydratio and TSH of 12.  Patient takes HCTZ 25 ng daily and had no history of G I illness   Repeat TSH one week later done by [facility] was 4.4 .  Has had episodes  since then of rapid heart rate and forceful pounding accompanied  by by flushed face and the sensation of chest tightness accompanied by dizziness.   Bps during these episodes never above 150,  Has occurred 3 or 4 times in the last 4-5 weeks. She has a history of a normal stress test done in August 2010 with 2 day hospitalization for chest pain .  She was apparently also had a prior evaluation for arrhythmia with Holter monitor and urine for catecholamines other the records are not readily available.  Past Medical History  Diagnosis Date  . Hypertension   . Hyperthyroidism   . Depression   . Anxiety   . Rhinosinusitis   . Sciatica   . Bronchitis    Current Outpatient Prescriptions on File Prior to Visit  Medication Sig Dispense Refill  . aspirin 81 MG tablet Take 81 mg by mouth daily.        [name] b complex vitamins tablet Take 1 tablet by mouth daily.        . Calcium-Magnesium 500-250 MG TABS Take by mouth 2 (two) times daily.        . Cholecalciferol (VITAMIN D3) 2000 UNITS TABS Take by mouth daily.        [name] co-enzyme Q-10 30 MG capsule Take 100 mg by mouth 3 (three) times daily.        . cromolyn (NASALCROM) 5.2 MG/ACT nasal spray 1 spray by Nasal route 4  (four) times daily as needed.        . NON FORMULARY Tumeric 2000 mg take 1 daily       . Omega-3 Fatty Acids (FISH OIL) 1000 MG CAPS Take by mouth 2 (two) times daily.        [name] PROBIOTIC CAPS Take by mouth daily.        [name] DISCONTD: levothyroxine (SYNTHROID, LEVOTHROID) 25 MCG tablet Take 1 tablet (25 mcg total) by mouth daily.  30 tablet  5  . DISCONTD: temazepam (RESTORIL) 15 MG capsule Take 15 mg by mouth at bedtime as needed.        [name] DISCONTD: valsartan (DIOVAN) 80 MG tablet Take 1 tablet (80 mg total) by mouth daily.  90 tablet  3  . temazepam (RESTORIL) 15 MG capsule Take 1 capsule (15 mg total) by mouth at bedtime as needed for sleep.  90 capsule  1     Review of Systems  Constitutional: Negative for fever, chills and unexpected weight change.  HENT: Negative for hearing loss, ear pain, nosebleeds, congestion, sore throat, facial swelling, rhinorrhea, sneezing, mouth sores, trouble swallowing, neck pain, neck stiffness, voice change, postnasal drip, sinus pressure, tinnitus and ear discharge.  Eyes: Negative for pain, discharge, redness and visual disturbance.  Respiratory: Positive for chest tightness and stridor. Negative for cough, shortness of breath and wheezing.   Cardiovascular: Positive for palpitations. Negative for chest pain and leg swelling.  Musculoskeletal: Negative for myalgias and arthralgias.  Skin: Negative for color change and rash.  Neurological: Negative for dizziness, weakness, light-headedness and headaches.  Hematological: Negative for adenopathy.       Objective:   Physical Exam  Constitutional: She is oriented to person, place, and time. She appears well-developed and well-nourished.  HENT:  Mouth/Throat: Oropharynx is clear and moist.  Eyes: EOM are normal. Pupils are equal, round, and reactive to light. No scleral icterus.  Neck: Normal range of motion. Neck supple. No JVD present. No thyromegaly present.  Cardiovascular: Normal rate, regular rhythm,  normal heart sounds and intact distal pulses.   Pulmonary/Chest: Effort normal and breath sounds normal.  Abdominal: Soft. Bowel sounds are normal. She exhibits no mass. There is no tenderness.  Musculoskeletal: Normal range of motion. She exhibits no edema.  Lymphadenopathy:    She has no cervical adenopathy.  Neurological: She is alert and oriented to person, place, and time.  Skin: Skin is warm and dry.  Psychiatric: She has a normal mood and affect.       Assessment & Plan:   Accelerated hypertension Accompanied by facial flushing palpitations and rapid heart rate. It is unclear whether she has truly had a workup for pheochromocytoma in the past. She is currently taking hydrochlorothiazide which we did be stopped for one week prior to collection of 24-hour urine for catecholamines. We'll do this today. Additionally her he her labs done several weeks ago do suggest that the hydrochlorothiazide may be causing dehydration and hyponatremia which may be aggravating her arrhythmia. We will repeat her basic metabolic panel today consider an alternative for her blood pressure  PAROXYSMAL TACHYCARDIA She has had a prior Holter monitor one to 2 years ago but has is having recurrent episodes which are quite symptomatic for her. Will refer to Dr. Lanae Boast for a 30 day CardioNet monitor. Checking electrolytes today as she did have some deficiencies during prior check. Thyroid function has been rejected and is within normal limits on current thyroid dose. No changes to thyroid medication to    Updated Medication List Outpatient Encounter Prescriptions as of 11/11/2011  Medication Sig Dispense Refill  . ALPHA LIPOIC ACID PO Take 100 mg by mouth.      [name] aspirin 81 MG tablet Take 81 mg by mouth daily.        [name] b complex vitamins tablet Take 1 tablet by mouth daily.        . Calcium-Magnesium 500-250 MG TABS Take by mouth 2 (two) times daily.        . Cholecalciferol (VITAMIN D3) 2000 UNITS TABS Take by  mouth daily.        [name] co-enzyme Q-10 30 MG capsule Take 100 mg by mouth 3 (three) times daily.        . cromolyn (NASALCROM) 5.2 MG/ACT nasal spray 1 spray by Nasal route 4 (four) times daily as needed.        . hydrochlorothiazide (HYDRODIURIL) 25 MG tablet Take 25 mg by mouth daily.      [name] levothyroxine (SYNTHROID, LEVOTHROID) 25 MCG tablet Take 1 tablet (25 mcg total) by mouth daily.  90 tablet  3  . Multiple Vitamin (MULTIVITAMIN) tablet Take 1 tablet by mouth daily.      [name]  NON FORMULARY Tumeric 2000 mg take 1 daily       . Omega-3 Fatty Acids (FISH OIL) 1000 MG CAPS Take by mouth 2 (two) times daily.        [name] PROBIOTIC CAPS Take by mouth daily.        . temazepam (RESTORIL) 15 MG capsule Take 1 capsule (15 mg total) by mouth at bedtime as needed.  90 capsule  3  . valsartan (DIOVAN) 160 MG tablet Take 1 tablet (160 mg total) by mouth daily.  90 tablet  3  . DISCONTD: levothyroxine (SYNTHROID, LEVOTHROID) 25 MCG tablet Take 1 tablet (25 mcg total) by mouth daily.  30 tablet  5  . DISCONTD: levothyroxine (SYNTHROID, LEVOTHROID) 25 MCG tablet Take 1 tablet (25 mcg total) by mouth daily.  90 tablet  3  . DISCONTD: levothyroxine (SYNTHROID, LEVOTHROID) 25 MCG tablet Take 1 tablet (25 mcg total) by mouth daily.  90 tablet  3  . DISCONTD: temazepam (RESTORIL) 15 MG capsule Take 15 mg by mouth at bedtime as needed.        [name] DISCONTD: valsartan (DIOVAN) 160 MG tablet Take 1 tablet (160 mg total) by mouth daily.  90 tablet  3  . DISCONTD: valsartan (DIOVAN) 160 MG tablet Take 1 tablet (160 mg total) by mouth daily.  90 tablet  3  . DISCONTD: valsartan (DIOVAN) 80 MG tablet Take 1 tablet (80 mg total) by mouth daily.  90 tablet  3  . DISCONTD: valsartan (DIOVAN) 80 MG tablet Take 120 mg by mouth daily.      . temazepam (RESTORIL) 15 MG capsule Take 1 capsule (15 mg total) by mouth at bedtime as needed for sleep.  90 capsule  1  . DISCONTD: Ascorbic Acid (VITAMIN C) 500 MG tablet Take 500 mg by mouth  daily.        [name] DISCONTD: hydrochlorothiazide (HYDRODIURIL) 12.5 MG tablet Take 12.5 mg by mouth daily.        [name] DISCONTD: traMADol (ULTRAM) 50 MG tablet Take 1-2 tablets (50-100 mg total) by mouth 4 (four) times daily as needed for pain. Take half at bedtime  160 tablet  2  . DISCONTD: triamcinolone (KENALOG) 0.1 % ointment Apply topically daily.        [name] DISCONTD: Zinc 25 MG TABS Take by mouth daily.

## 2011-11-11 NOTE — Assessment & Plan Note (Signed)
With fecal occult blood testing and prior iron supplementation and repeat CBC showed normal hemoglobin normal MCV and RDW. She has stopped iron several months ago possibility she needs to continue taking it. We'll repeat iron studies .

## 2011-11-11 NOTE — Patient Instructions (Signed)
Stop the HCTZ.  Start the urine collection in one week.   Increase the Diovan to 160 mg daily.  If you run out you can substitue Micardis 80 mg on e  tablet daily until your new rx is delivered

## 2011-11-12 LAB — CATECHOLAMINES, FRACTIONATED, URINE, 24 HOUR

## 2011-11-12 LAB — METANEPHRINES, URINE, 24 HOUR

## 2011-11-12 LAB — IRON AND TIBC
%SAT: 46 % (ref 20–55)
TIBC: 389 ug/dL (ref 250–470)

## 2011-11-14 ENCOUNTER — Telehealth: Payer: Self-pay | Admitting: Internal Medicine

## 2011-11-14 ENCOUNTER — Encounter: Payer: Self-pay | Admitting: Internal Medicine

## 2011-11-14 NOTE — Telephone Encounter (Signed)
Her iron studies are normal.  No signs of deficiency

## 2011-11-14 NOTE — Telephone Encounter (Signed)
Patient notified of results.

## 2011-11-18 ENCOUNTER — Telehealth: Payer: Self-pay | Admitting: Internal Medicine

## 2011-11-18 NOTE — Telephone Encounter (Signed)
Thank you  .  Has she collected the 24 hour urine collection ?  We cannot add more medications until that has been collected and until we have her pulse readings .  She can increase the diovan to  2 tablets daily

## 2011-11-18 NOTE — Telephone Encounter (Signed)
Patient called and stated her BP has been elevated in the evenings and that is when she takes the Diovan.  She said her BP was 173/88 and after she took the Diovan it was 160/89.  She wanted to know if she should increase the Diovan in the evenings.  Please advise.  I have asked patient to call me back to let her know that we need her pulse readings.

## 2011-11-19 NOTE — Telephone Encounter (Signed)
She is starting the urine collection today.  She stated her heart rate has been running in the high 70s to low 80s.

## 2011-11-20 ENCOUNTER — Other Ambulatory Visit: Payer: Self-pay | Admitting: Internal Medicine

## 2011-11-21 LAB — IRON AND TIBC

## 2011-11-25 LAB — METANEPHRINES, URINE, 24 HOUR
Metaneph Total, Ur: 269 mcg/24 h (ref 224–832)
Metanephrines, Ur: 89 mcg/24 h — ABNORMAL LOW (ref 90–315)
Normetanephrine, 24H Ur: 180 mcg/24 h (ref 122–676)

## 2011-11-25 LAB — CATECHOLAMINES, FRACTIONATED, URINE, 24 HOUR: Total Volume - CF 24Hr U: 3500 mL

## 2011-12-02 ENCOUNTER — Encounter: Payer: Self-pay | Admitting: Cardiovascular Disease

## 2011-12-02 ENCOUNTER — Ambulatory Visit (INDEPENDENT_AMBULATORY_CARE_PROVIDER_SITE_OTHER): Payer: Medicare Other | Admitting: Cardiovascular Disease

## 2011-12-02 VITALS — BP 174/91 | HR 85 | Ht 63.0 in | Wt 127.0 lb

## 2011-12-02 DIAGNOSIS — E785 Hyperlipidemia, unspecified: Secondary | ICD-10-CM

## 2011-12-02 DIAGNOSIS — I10 Essential (primary) hypertension: Secondary | ICD-10-CM

## 2011-12-02 DIAGNOSIS — I479 Paroxysmal tachycardia, unspecified: Secondary | ICD-10-CM

## 2011-12-02 DIAGNOSIS — F411 Generalized anxiety disorder: Secondary | ICD-10-CM

## 2011-12-02 NOTE — Assessment & Plan Note (Signed)
In an effort to improve her symptoms of tachycardia, we have suggested she try low-dose bystolic. She has tried previous beta blockers before and is wary of them as she had fatigue. We will try bystolic as usually it has a better side effect profile than other beta blockers. She will pick up 5 mg and 10 mg samples from Dr. Lauretta Grill office. If these are not available, we did provide her with 20 mg samples that she could cut in half.   We have suggested she try to start 5 mg for the first week, titrating upwards to 10 mg if needed for rate and rhythm control and blood pressure. I have asked her to track her blood pressure and heart rate numbers.  If needed, additional testing could be performed such as Holter or event monitor.

## 2011-12-02 NOTE — Assessment & Plan Note (Signed)
Uncertain if anxiety is playing a role in her tachycardia, facial flushing and other symptoms. We'll try to alleviate some of her symptoms with bystolic for rate control.

## 2011-12-02 NOTE — Progress Notes (Signed)
Patient ID: Wendy Love, female    DOB: 1940-09-23, 71 y.o.   MRN: [id number]  HPI Comments: [name] is a very pleasant 71 year old woman who presents for evaluation of tachycardia, flushing, chest heaviness.  She reports that over the past several weeks, she has had problems with tachycardia that comes on at rest, a feeling of nervousness sometimes associated with facial flushing, hypertension. Rarely she has had chest heaviness and ache blurry vision. Normally when she has these episodes, she tries to relax and to resolve. She did go to the emergency room on January 21 EKG that showed sinus tachycardia, rate 100 beats per minute. Lab work at that time was essentially normal with normal cardiac enzymes. Potassium was low at 3.1. Sodium was also low, chloride was low and her HCTZ has since been discontinued. TSH was 12 she reports that since that time, her thyroid levels have returned to normal.  She does present details of her blood pressure over the past month. In general, since her HCTZ was held, systolic pressures have been running in the 150 to 160 range.  EKG today shows normal sinus rhythm with rate 77 beats per minute with no significant ST or T wave changes   Outpatient Encounter Prescriptions as of 12/02/2011  Medication Sig Dispense Refill  . ALPHA LIPOIC ACID PO Take 100 mg by mouth.      [name] aspirin 81 MG tablet Take 81 mg by mouth daily.        [name] b complex vitamins tablet Take 1 tablet by mouth daily.        . Calcium-Magnesium 500-250 MG TABS Take by mouth 2 (two) times daily.        . Cholecalciferol (VITAMIN D3) 2000 UNITS TABS Take by mouth daily.        [name] co-enzyme Q-10 30 MG capsule Take 100 mg by mouth 3 (three) times daily.        . cromolyn (NASALCROM) 5.2 MG/ACT nasal spray 1 spray by Nasal route 4 (four) times daily as needed.        [name] levothyroxine (SYNTHROID, LEVOTHROID) 25 MCG tablet Take 1 tablet (25 mcg total) by mouth daily.  90 tablet  3  . Multiple  Vitamin (MULTIVITAMIN) tablet Take 1 tablet by mouth daily.      . NON FORMULARY Tumeric 2000 mg take 1 daily       . Omega-3 Fatty Acids (FISH OIL) 1000 MG CAPS Take by mouth 2 (two) times daily.        [name] PROBIOTIC CAPS Take by mouth daily.        . temazepam (RESTORIL) 15 MG capsule Take 1 capsule (15 mg total) by mouth at bedtime as needed.  90 capsule  3  . valsartan (DIOVAN) 160 MG tablet Take 1 tablet (160 mg total) by mouth daily.  90 tablet  3  . temazepam (RESTORIL) 15 MG capsule Take 1 capsule (15 mg total) by mouth at bedtime as needed for sleep.  90 capsule  1     Review of Systems  Constitutional: Negative.   HENT: Negative.   Eyes: Negative.   Respiratory: Positive for chest tightness.   Cardiovascular: Positive for palpitations.       Tachycardia  Gastrointestinal: Negative.   Musculoskeletal: Negative.   Skin: Negative.   Neurological: Negative.   Hematological: Negative.   Psychiatric/Behavioral: Negative.   All other systems reviewed and are negative.    BP 174/91  Pulse 85  Ht  5\' 3"  (1.6 m)  Wt 127 lb (57.607 kg)  BMI 22.50 kg/m2  Physical Exam  Nursing note and vitals reviewed. Constitutional: She is oriented to person, place, and time. She appears well-developed and well-nourished.  HENT:  Head: Normocephalic.  Nose: Nose normal.  Mouth/Throat: Oropharynx is clear and moist.  Eyes: Conjunctivae are normal. Pupils are equal, round, and reactive to light.  Neck: Normal range of motion. Neck supple. No JVD present.  Cardiovascular: Normal rate, regular rhythm, S1 normal, S2 normal, normal heart sounds and intact distal pulses.  Exam reveals no gallop and no friction rub.   No murmur heard. Pulmonary/Chest: Effort normal and breath sounds normal. No respiratory distress. She has no wheezes. She has no rales. She exhibits no tenderness.  Abdominal: Soft. Bowel sounds are normal. She exhibits no distension. There is no tenderness.  Musculoskeletal: Normal  range of motion. She exhibits no edema and no tenderness.  Lymphadenopathy:    She has no cervical adenopathy.  Neurological: She is alert and oriented to person, place, and time. Coordination normal.  Skin: Skin is warm and dry. No rash noted. No erythema.  Psychiatric: She has a normal mood and affect. Her behavior is normal. Judgment and thought content normal.         Assessment and Plan

## 2011-12-02 NOTE — Assessment & Plan Note (Signed)
We will start bystolic as detailed above and titrate upwards as tolerated. If needed, calcium channel blocker could be added for blood pressure control.

## 2011-12-02 NOTE — Assessment & Plan Note (Signed)
Cholesterol is high. We'll discuss this with her in followup. She may benefit from prescription cholesterol medication if she is willing.

## 2011-12-02 NOTE — Patient Instructions (Signed)
You are doing well. Please start bystolic 5 mg daily If you have breakthrough tachycardia, take an additional 5 mg pill  If you continue to have symptoms, start on a 10 mg pill of bystolic   Track your blood pressures   Please call us if you have new issues that need to be addressed before your next appt.  Your physician wants you to follow-up in: 1 months.  You will receive a reminder letter in the mail two months in advance. If you don't receive a letter, please call our office to schedule the follow-up appointment.

## 2011-12-13 ENCOUNTER — Encounter: Payer: Self-pay | Admitting: Internal Medicine

## 2011-12-13 ENCOUNTER — Ambulatory Visit (INDEPENDENT_AMBULATORY_CARE_PROVIDER_SITE_OTHER): Payer: Medicare Other | Admitting: Internal Medicine

## 2011-12-13 VITALS — BP 168/86 | HR 84 | Temp 98.3°F | Resp 16 | Wt 127.8 lb

## 2011-12-13 DIAGNOSIS — I10 Essential (primary) hypertension: Secondary | ICD-10-CM

## 2011-12-13 DIAGNOSIS — E039 Hypothyroidism, unspecified: Secondary | ICD-10-CM

## 2011-12-13 NOTE — Patient Instructions (Signed)
Resume HCTZ at 12.5 mg (1/2 tablet)  Come back on Monday for thyroid studies

## 2011-12-13 NOTE — Progress Notes (Signed)
Patient ID: Wendy Love, female   DOB: Dec 17, 1940, 71 y.o.   MRN: [id number]    Patient Active Problem List  Diagnoses  . UNSPECIFIED HYPOTHYROIDISM  . DYSLIPIDEMIA  . ANXIETY  . INSOMNIA, CHRONIC  . DEPRESSION  . CARPAL TUNNEL SYNDROME  . HYPERTENSION  . ANGINA, UNSTABLE  . TEMPOROMANDIBULAR JOINT PAIN  . CYST, VULVA  . SYMPTOMATIC MENOPAUSAL/FEMALE CLIMACTERIC STATES  . IDIOPATHIC URTICARIA  . DEGENERATIVE JOINT DISEASE, CERVICAL SPINE  . SCIATICA, RIGHT  . UNEQUAL LEG LENGTH  . TREMOR  . COUGH, CHRONIC  . OTHER SYMPTOMS INVOLVING DIGESTIVE SYSTEM OTHER  . PAROXYSMAL TACHYCARDIA  . ANEMIA, IRON DEFICIENCY  . Accelerated hypertension    Subjective:  CC:   Chief Complaint  Patient presents with  . Follow-up    HPI:   Wendy Love a 71 y.o. female who presents for follow up on hypertension.  During recent cardiology evaluation she was started on bystolic 5 mg daily by Dr. Mariah Milling for palpitations,  with good control of pulse and bp for the most part but she continues to report infrequent disconcerting episodes of  visual changes, (peripheral waviness,  But straight ahead vision ok ,  No double vision) which  lasted 10 minutes.  The accompanying headache was frontal and mild.  Sytolic bp was 160 at that time. This has occurred twice.     Past Medical History  Diagnosis Date  . Hypertension   . Hyperthyroidism   . Depression   . Anxiety   . Rhinosinusitis   . Sciatica   . Bronchitis   . Endometriosis 1974    s/p abdominal surgery, 2nd surgery    Past Surgical History  Procedure Date  . Carpal tunnel release     bilateral  . Appendectomy   . Hemilaminectomy April 2010    L3-L4hemi, L4-L5 microdiskectomy  . Endoscopy/colonoscopy April 2012    Schatski's Ring , small hiatal hernia         The following portions of the patient's history were reviewed and updated as appropriate: Allergies, current medications, and problem list.    Review  of Systems:   12 Pt  review of systems was negative except those addressed in the HPI,     History   Social History  . Marital Status: Married    Spouse Name: N/A    Number of Children: N/A  . Years of Education: N/A   Occupational History  . Not on file.   Social History Main Topics  . Smoking status: Never Smoker   . Smokeless tobacco: Not on file  . Alcohol Use: No  . Drug Use: No  . Sexually Active:    Other Topics Concern  . Not on file   Social History Narrative  . No narrative on file    Objective:  BP 168/86  Pulse 84  Temp(Src) 98.3 F (36.8 C) (Oral)  Resp 16  Wt 127 lb 12 oz (57.947 kg)  SpO2 99%  General appearance: alert, cooperative and appears stated age Ears: normal TM's and external ear canals both ears Throat: lips, mucosa, and tongue normal; teeth and gums normal Neck: no adenopathy, no carotid bruit, supple, symmetrical, trachea midline and thyroid not enlarged, symmetric, no tenderness/mass/nodules Back: symmetric, no curvature. ROM normal. No CVA tenderness. Lungs: clear to auscultation bilaterally Heart: regular rate and rhythm, S1, S2 normal, no murmur, click, rub or gallop Abdomen: soft, non-tender; bowel sounds normal; no masses,  no organomegaly Pulses: 2+ and symmetric Skin:  Skin color, texture, turgor normal. No rashes or lesions Lymph nodes: Cervical, supraclavicular, and axillary nodes normal.  Assessment and Plan:  HYPERTENSION With episodes of palpitations and headache, prompting cardiology evaluation and serologic evaluation for pheochromocytoma, which was negative. Her home blood pressures have improved with the additional of Bystolic 5 mg but are not yet at goal.  Pulse is typically low 60's.  .  Adding back 12. 5mg  hctz   UNSPECIFIED HYPOTHYROIDISM Last check was June 2012.  Given her palpitations, will recheck and aim for TSH above 2.     Updated Medication List Outpatient Encounter Prescriptions as of 12/13/2011    Medication Sig Dispense Refill  . ALPHA LIPOIC ACID PO Take 100 mg by mouth.      [name] aspirin 81 MG tablet Take 81 mg by mouth daily.        [name] b complex vitamins tablet Take 1 tablet by mouth daily.        . Calcium-Magnesium 500-250 MG TABS Take by mouth 2 (two) times daily.        . Cholecalciferol (VITAMIN D3) 2000 UNITS TABS Take by mouth daily.        [name] co-enzyme Q-10 30 MG capsule Take 100 mg by mouth 3 (three) times daily.        . cromolyn (NASALCROM) 5.2 MG/ACT nasal spray 1 spray by Nasal route 4 (four) times daily as needed.        [name] levothyroxine (SYNTHROID, LEVOTHROID) 25 MCG tablet Take 1 tablet (25 mcg total) by mouth daily.  90 tablet  3  . Multiple Vitamin (MULTIVITAMIN) tablet Take 1 tablet by mouth daily.      . nebivolol (BYSTOLIC) 5 MG tablet Take 5 mg by mouth daily.      . NON FORMULARY Tumeric 2000 mg take 1 daily       . Omega-3 Fatty Acids (FISH OIL) 1000 MG CAPS Take by mouth 2 (two) times daily.        [name] PROBIOTIC CAPS Take by mouth daily.        . temazepam (RESTORIL) 15 MG capsule Take 1 capsule (15 mg total) by mouth at bedtime as needed.  90 capsule  3  . valsartan (DIOVAN) 160 MG tablet Take 1 tablet (160 mg total) by mouth daily.  90 tablet  3  . temazepam (RESTORIL) 15 MG capsule Take 1 capsule (15 mg total) by mouth at bedtime as needed for sleep.  90 capsule  1     Orders Placed This Encounter  Procedures  . T3, free    No Follow-up on file.

## 2011-12-13 NOTE — Assessment & Plan Note (Addendum)
With episodes of palpitations and headache, prompting cardiology evaluation and serologic evaluation for pheochromocytoma, which was negative. Her home blood pressures have improved with the additional of Bystolic 5 mg but are not yet at goal.  Pulse is typically low 60's.  .  Adding back 12. 5mg  hctz

## 2011-12-15 ENCOUNTER — Encounter: Payer: Self-pay | Admitting: Internal Medicine

## 2011-12-15 NOTE — Assessment & Plan Note (Signed)
Last check was June 2012.  Given her palpitations, will recheck and aim for TSH above 2.

## 2011-12-16 ENCOUNTER — Other Ambulatory Visit (INDEPENDENT_AMBULATORY_CARE_PROVIDER_SITE_OTHER): Payer: Medicare Other | Admitting: *Deleted

## 2011-12-16 DIAGNOSIS — E039 Hypothyroidism, unspecified: Secondary | ICD-10-CM

## 2011-12-16 NOTE — Progress Notes (Signed)
Addended by: Melody Comas L on: 12/16/2011 10:02 AM   Modules accepted: Orders

## 2011-12-17 ENCOUNTER — Encounter: Payer: Self-pay | Admitting: Internal Medicine

## 2011-12-18 ENCOUNTER — Other Ambulatory Visit: Payer: Self-pay

## 2011-12-18 MED ORDER — NEBIVOLOL HCL 5 MG PO TABS: 5.0000 mg | ORAL_TABLET | Freq: Every day | ORAL | Status: DC

## 2011-12-19 ENCOUNTER — Telehealth: Payer: Self-pay | Admitting: Internal Medicine

## 2011-12-19 DIAGNOSIS — E039 Hypothyroidism, unspecified: Secondary | ICD-10-CM

## 2011-12-19 MED ORDER — LEVOTHYROXINE SODIUM 25 MCG PO TABS: 25.0000 ug | ORAL_TABLET | Freq: Every day | ORAL | Status: DC

## 2011-12-19 NOTE — Telephone Encounter (Signed)
Her labs got routed to Dr, Debby Bud bc he used to be her PCP.  This needs to be changed in the system if possible.  Her thyroid function is normal on current dose of thryoid medication.  We will continue 25 mcg daily.  I have sent  refills to her [facility] mail via [facility]

## 2011-12-19 NOTE — Telephone Encounter (Signed)
Patient called requesting her lab results for her thyroid because she is running low on medication and needs to know if her dose is changed.  I looked at her chart and the results are there but they were somehow routed to Dr. Debby Bud.  Please advise.

## 2011-12-20 ENCOUNTER — Other Ambulatory Visit: Payer: Self-pay | Admitting: Internal Medicine

## 2011-12-20 DIAGNOSIS — E039 Hypothyroidism, unspecified: Secondary | ICD-10-CM

## 2011-12-20 MED ORDER — LEVOTHYROXINE SODIUM 25 MCG PO TABS: 25.0000 ug | ORAL_TABLET | Freq: Every day | ORAL | Status: DC

## 2011-12-20 NOTE — Telephone Encounter (Signed)
Patient notified of results.

## 2012-01-08 ENCOUNTER — Ambulatory Visit (INDEPENDENT_AMBULATORY_CARE_PROVIDER_SITE_OTHER): Payer: Medicare Other | Admitting: Cardiovascular Disease

## 2012-01-08 ENCOUNTER — Encounter: Payer: Self-pay | Admitting: Cardiovascular Disease

## 2012-01-08 VITALS — BP 140/80 | HR 66 | Ht 63.0 in | Wt 130.0 lb

## 2012-01-08 DIAGNOSIS — I479 Paroxysmal tachycardia, unspecified: Secondary | ICD-10-CM

## 2012-01-08 DIAGNOSIS — I10 Essential (primary) hypertension: Secondary | ICD-10-CM

## 2012-01-08 DIAGNOSIS — E785 Hyperlipidemia, unspecified: Secondary | ICD-10-CM

## 2012-01-08 MED ORDER — NEBIVOLOL HCL 10 MG PO TABS: 10.0000 mg | ORAL_TABLET | Freq: Every day | ORAL | Status: DC

## 2012-01-08 NOTE — Progress Notes (Signed)
Patient ID: Wendy Love, female    DOB: [birth year], 71 y.o.   MRN: [id number]  HPI Comments: [name] is a very pleasant 71 year old woman with previous symptoms of tachycardia, flushing, chest heaviness, started on bystolic 5 mg daily who presents for routine followup.  She states that overall she feels well. She denies any further tachycardia or palpitations. Heart rate is running in the 60s. She wonders if her previous symptoms could have been secondary to a steroid shot that she received in her back (hip?). She has been monitoring her blood pressure and systolic pressure is typically in the 140 range.  She is otherwise active and has no complaints. Total cholesterol is 250.  In the past, she reported  tachycardia at rest, a feeling of nervousness sometimes associated with facial flushing, hypertension. Rarely  had chest heaviness and ache blurry vision. She did go to the emergency room on September 30 2011 and EKG that showed sinus tachycardia, rate 100 beats per minute. Lab work at that time was essentially normal with normal cardiac enzymes. Potassium was low at 3.1. Sodium was also low, chloride was low.  TSH was 12 she reports that since that time, her thyroid levels have returned to normal.  Previous CT scan of the chest showed mild plaquing in the aortic arch, coronary calcium score of 0.  Old EKG today shows normal sinus rhythm with rate 77 beats per minute with no significant ST or T wave changes   Outpatient Encounter Prescriptions as of 01/08/2012  Medication Sig Dispense Refill  . ALPHA LIPOIC ACID PO Take 100 mg by mouth.      [name] aspirin 81 MG tablet Take 81 mg by mouth daily.        [name] b complex vitamins tablet Take 1 tablet by mouth daily.        . Calcium-Magnesium 500-250 MG TABS Take by mouth 2 (two) times daily.        . Cholecalciferol (VITAMIN D3) 2000 UNITS TABS Take by mouth daily.        [name] co-enzyme Q-10 30 MG capsule Take 100 mg by mouth 3 (three) times daily.         . cromolyn (NASALCROM) 5.2 MG/ACT nasal spray 1 spray by Nasal route 4 (four) times daily as needed.        [name] levothyroxine (SYNTHROID, LEVOTHROID) 25 MCG tablet Take 1 tablet (25 mcg total) by mouth daily.  30 tablet  0  . Multiple Vitamin (MULTIVITAMIN) tablet Take 1 tablet by mouth daily.      . NON FORMULARY Tumeric 2000 mg take 1 daily       . Omega-3 Fatty Acids (FISH OIL) 1000 MG CAPS Take by mouth 2 (two) times daily.        [name] PROBIOTIC CAPS Take by mouth daily.        . temazepam (RESTORIL) 15 MG capsule Take 1 capsule (15 mg total) by mouth at bedtime as needed.  90 capsule  3  . valsartan (DIOVAN) 160 MG tablet Take 1 tablet (160 mg total) by mouth daily.  90 tablet  3  . : nebivolol (BYSTOLIC) 5 MG tablet Take 1 tablet (5 mg total) by mouth daily.  30 tablet  3     Review of Systems  Constitutional: Negative.   HENT: Negative.   Eyes: Negative.   Cardiovascular:       Tachycardia  Gastrointestinal: Negative.   Musculoskeletal: Negative.   Skin: Negative.  Neurological: Negative.   Hematological: Negative.   Psychiatric/Behavioral: Negative.   All other systems reviewed and are negative.    BP 140/80  Pulse 66  Ht 5\' 3"  (1.6 m)  Wt 130 lb (58.968 kg)  BMI 23.03 kg/m2  Physical Exam  Nursing note and vitals reviewed. Constitutional: She is oriented to person, place, and time. She appears well-developed and well-nourished.  HENT:  Head: Normocephalic.  Nose: Nose normal.  Mouth/Throat: Oropharynx is clear and moist.  Eyes: Conjunctivae are normal. Pupils are equal, round, and reactive to light.  Neck: Normal range of motion. Neck supple. No JVD present.  Cardiovascular: Normal rate, regular rhythm, S1 normal, S2 normal, normal heart sounds and intact distal pulses.  Exam reveals no gallop and no friction rub.   No murmur heard. Pulmonary/Chest: Effort normal and breath sounds normal. No respiratory distress. She has no wheezes. She has no rales. She  exhibits no tenderness.  Abdominal: Soft. Bowel sounds are normal. She exhibits no distension. There is no tenderness.  Musculoskeletal: Normal range of motion. She exhibits no edema and no tenderness.  Lymphadenopathy:    She has no cervical adenopathy.  Neurological: She is alert and oriented to person, place, and time. Coordination normal.  Skin: Skin is warm and dry. No rash noted. No erythema.  Psychiatric: She has a normal mood and affect. Her behavior is normal. Judgment and thought content normal.         Assessment and Plan

## 2012-01-08 NOTE — Assessment & Plan Note (Signed)
Blood pressure continues to be mildly elevated. We have suggested several options including moving her Diovan to the morning. I suspect she will need a larger dose such as 320 mg. Interestingly I think Diovan HCT 320/12.5 mg daily may be cheaper than separating the medications. She currently has a 3 month prescription of her old dose and would like to use this first.   She asked whether she could stop the bystolic. I suggested she try to stay on this for now as her heart rate is excellent and she has no symptoms of tachycardia. She could certainly try to hold it for a short period of time to make sure that her previous symptoms were not secondary to a steroid shot.

## 2012-01-08 NOTE — Patient Instructions (Addendum)
You are doing well.  For blood pressure, try taking diovan either later at night or in the AM [facility] to do a trial with bystolic and monitor heart rate.  Consider NMR lipoprofile on your next cholesterol check  Please call us if you have new issues that need to be addressed before your next appt.  Your physician wants you to follow-up in: 6 months.  You will receive a reminder letter in the mail two months in advance. If you don't receive a letter, please call our office to schedule the follow-up appointment.

## 2012-01-08 NOTE — Assessment & Plan Note (Signed)
We did discuss her cholesterol with her. His is elevated. Her father who was a diabetic had heart disease. She does have mild plaquing in her aortic arch though no coronary calcification noted 6 years ago. One option would be to check an NMR lipoprofile on her next lab draw to help risk stratify her and look at her small LDL particle number.

## 2012-01-08 NOTE — Assessment & Plan Note (Signed)
Tachycardia has improved with bystolic 5 mg daily.

## 2012-01-09 LAB — HM MAMMOGRAPHY: HM Mammogram: NORMAL

## 2012-06-12 ENCOUNTER — Encounter: Payer: Self-pay | Admitting: Internal Medicine

## 2012-06-12 ENCOUNTER — Ambulatory Visit (INDEPENDENT_AMBULATORY_CARE_PROVIDER_SITE_OTHER): Payer: Medicare Other | Admitting: Internal Medicine

## 2012-06-12 VITALS — BP 130/82 | HR 80 | Temp 97.8°F | Ht 63.0 in | Wt 128.8 lb

## 2012-06-12 DIAGNOSIS — R232 Flushing: Secondary | ICD-10-CM

## 2012-06-12 DIAGNOSIS — E785 Hyperlipidemia, unspecified: Secondary | ICD-10-CM

## 2012-06-12 DIAGNOSIS — I10 Essential (primary) hypertension: Secondary | ICD-10-CM

## 2012-06-12 MED ORDER — ESTRADIOL 0.075 MG/24HR TD PTTW: 1.0000 | MEDICATED_PATCH | TRANSDERMAL | Status: DC

## 2012-06-12 MED ORDER — HYDROCHLOROTHIAZIDE 25 MG PO TABS: 25.0000 mg | ORAL_TABLET | Freq: Every day | ORAL | Status: DC

## 2012-06-12 MED ORDER — TEMAZEPAM 15 MG PO CAPS: 15.0000 mg | ORAL_CAPSULE | Freq: Every evening | ORAL | Status: DC | PRN

## 2012-06-12 NOTE — Patient Instructions (Addendum)
We will consider changing valsartan to losartan for cost savings.  available as losartan/hct   We can use amlodipine as needed for blood pressure elevations above 150.  trial of estrogen transdermal patches to change twice weekly   Return for fasting labs (includes screening for diabetes )

## 2012-06-12 NOTE — Progress Notes (Signed)
Patient ID: Wendy Love, female   DOB: 02/14/1941, 71 y.o.   MRN: [id number]  Patient Active Problem List  Diagnosis  . UNSPECIFIED HYPOTHYROIDISM  . DYSLIPIDEMIA  . ANXIETY  . INSOMNIA, CHRONIC  . DEPRESSION  . CARPAL TUNNEL SYNDROME  . HYPERTENSION  . TEMPOROMANDIBULAR JOINT PAIN  . CYST, VULVA  . SYMPTOMATIC MENOPAUSAL/FEMALE CLIMACTERIC STATES  . IDIOPATHIC URTICARIA  . DEGENERATIVE JOINT DISEASE, CERVICAL SPINE  . SCIATICA, RIGHT  . UNEQUAL LEG LENGTH  . TREMOR  . COUGH, CHRONIC  . OTHER SYMPTOMS INVOLVING DIGESTIVE SYSTEM OTHER  . PAROXYSMAL TACHYCARDIA  . ANEMIA, IRON DEFICIENCY  . Accelerated hypertension  . Flushing reaction    Subjective:  CC:   Chief Complaint  Patient presents with  . Follow-up    HPI:   Wendy Love a 71 y.o. female who presents for  follow up on chronic conditions.  She reports no more palpitations in over two months and her beta blocker (bystolic) was changed to prn by gollan..  Since it was changed to prn, for the last few weeks her blood pressures have been elevated,  She continues to have episodes of flushing, ringing in the ears,accompanied by a  feeling of weakness and light headed.  No diarrhea, abdominal pain or weight loss. She has had her bp checked during episodes and systolic has been around 155 and pulse is in the 70s.  She wonders if she is having a return of menopausal hot flashes.   Past Medical History  Diagnosis Date  . Hypertension   . Hyperthyroidism   . Depression   . Anxiety   . Rhinosinusitis   . Sciatica   . Bronchitis   . Endometriosis 1974    s/p abdominal surgery, 2nd surgery    Past Surgical History  Procedure Date  . Carpal tunnel release     bilateral  . Appendectomy   . Hemilaminectomy April 2010    L3-L4hemi, L4-L5 microdiskectomy  . Endoscopy/colonoscopy April 2012    Schatski's Ring , small hiatal hernia         The following portions of the patient's history were  reviewed and updated as appropriate: Allergies, current medications, and problem list.    Review of Systems:   12 Pt  review of systems was negative except those addressed in the HPI,     History   Social History  . Marital Status: Married    Spouse Name: N/A    Number of Children: N/A  . Years of Education: N/A   Occupational History  . Not on file.   Social History Main Topics  . Smoking status: Never Smoker   . Smokeless tobacco: Not on file  . Alcohol Use: No  . Drug Use: No  . Sexually Active:    Other Topics Concern  . Not on file   Social History Narrative  . No narrative on file    Objective:  BP 130/82  Pulse 80  Temp 97.8 F (36.6 C) (Oral)  Ht 5\' 3"  (1.6 m)  Wt 128 lb 12 oz (58.401 kg)  BMI 22.81 kg/m2  SpO2 99%  General appearance: alert, cooperative and appears stated age Throat: lips, mucosa, and tongue normal; teeth and gums normal Neck: no adenopathy, no carotid bruit, supple, symmetrical, trachea midline and thyroid not enlarged, symmetric, no tenderness/mass/nodules Back: symmetric, no curvature. ROM normal. No CVA tenderness. Lungs: clear to auscultation bilaterally Heart: regular rate and rhythm, S1, S2 normal, no murmur, click, rub or  gallop Abdomen: soft, non-tender; bowel sounds normal; no masses,  no organomegaly Pulses: 2+ and symmetric Skin: Skin color, texture, turgor normal. No rashes or lesions Lymph nodes: Cervical, supraclavicular, and axillary nodes normal.  Assessment and Plan:  Flushing reaction etiology unclear .  She appears very healthy, has had a cardiology evaluation,  Urine catecholamines checked , and is up to date on colonoscopy.  Will treat with transdermal estrogen for a month to see if symptoms are menopausal .  HYPERTENSION With loss of control, since stopping Bystolic,  Trial of amlodipine for systolic > 150 . continue diovan bu consider chanfing to losartan hct for cost savings  DYSLIPIDEMIA Currently  not treated, with calcium score of 0 per cardiology evaluation.  Given her family history of CAD an NMR lipoprofile has been added to next fasting lipid eval   Updated Medication List Outpatient Encounter Prescriptions as of 06/12/2012  Medication Sig Dispense Refill  . ALPHA LIPOIC ACID PO Take 100 mg by mouth.      [name] aspirin 81 MG tablet Take 81 mg by mouth daily.        [name] b complex vitamins tablet Take 1 tablet by mouth daily.        . Calcium-Magnesium 500-250 MG TABS Take by mouth 2 (two) times daily.        . Cholecalciferol (VITAMIN D3) 2000 UNITS TABS Take by mouth daily.        [name] co-enzyme Q-10 30 MG capsule Take 100 mg by mouth 3 (three) times daily.        . cromolyn (NASALCROM) 5.2 MG/ACT nasal spray 1 spray by Nasal route 4 (four) times daily as needed.        . hydrochlorothiazide (HYDRODIURIL) 25 MG tablet Take 1 tablet (25 mg total) by mouth daily.  90 tablet  3  . levothyroxine (SYNTHROID, LEVOTHROID) 25 MCG tablet Take 1 tablet (25 mcg total) by mouth daily.  30 tablet  0  . Multiple Vitamin (MULTIVITAMIN) tablet Take 1 tablet by mouth daily.      . nebivolol (BYSTOLIC) 10 MG tablet Take 1 tablet (10 mg total) by mouth daily.  90 tablet  3  . NON FORMULARY Tumeric 2000 mg take 1 daily       . Omega-3 Fatty Acids (FISH OIL) 1000 MG CAPS Take by mouth 2 (two) times daily.        [name] PROBIOTIC CAPS Take by mouth daily.        . temazepam (RESTORIL) 15 MG capsule Take 1 capsule (15 mg total) by mouth at bedtime as needed.  90 capsule  3  . valsartan (DIOVAN) 160 MG tablet Take 1 tablet (160 mg total) by mouth daily.  90 tablet  3  . DISCONTD: hydrochlorothiazide (HYDRODIURIL) 25 MG tablet Take 25 mg by mouth daily.      [name] DISCONTD: temazepam (RESTORIL) 15 MG capsule Take 1 capsule (15 mg total) by mouth at bedtime as needed.  90 capsule  3  . estradiol (VIVELLE-DOT) 0.075 MG/24HR Place 1 patch onto the skin 2 (two) times a week.  8 patch  12

## 2012-06-14 ENCOUNTER — Encounter: Payer: Self-pay | Admitting: Internal Medicine

## 2012-06-14 DIAGNOSIS — R232 Flushing: Secondary | ICD-10-CM | POA: Insufficient documentation

## 2012-06-14 NOTE — Assessment & Plan Note (Signed)
Currently not treated, with calcium score of 0 per cardiology evaluation.  Given her family history of CAD an NMR lipoprofile has been added to next fasting lipid eval

## 2012-06-14 NOTE — Assessment & Plan Note (Signed)
etiology unclear .  She appears very healthy, has had a cardiology evaluation,  Urine catecholamines checked , and is up to date on colonoscopy.  Will treat with transdermal estrogen for a month to see if symptoms are menopausal .

## 2012-06-14 NOTE — Assessment & Plan Note (Addendum)
With loss of control, since stopping Bystolic,  Trial of amlodipine for systolic > 150 . continue diovan bu consider chanfing to losartan hct for cost savings

## 2012-06-16 ENCOUNTER — Telehealth: Payer: Self-pay | Admitting: Internal Medicine

## 2012-06-16 ENCOUNTER — Other Ambulatory Visit (INDEPENDENT_AMBULATORY_CARE_PROVIDER_SITE_OTHER): Payer: Medicare Other

## 2012-06-16 DIAGNOSIS — E785 Hyperlipidemia, unspecified: Secondary | ICD-10-CM

## 2012-06-16 LAB — LIPID PANEL
Total CHOL/HDL Ratio: 3
Triglycerides: 63 mg/dL (ref 0.0–149.0)
VLDL: 12.6 mg/dL (ref 0.0–40.0)

## 2012-06-16 LAB — COMPREHENSIVE METABOLIC PANEL
ALT: 17 U/L (ref 0–35)
AST: 28 U/L (ref 0–37)
Alkaline Phosphatase: 53 U/L (ref 39–117)
CO2: 27 mEq/L (ref 19–32)
GFR: 100.55 mL/min (ref >60.00)
Sodium: 132 mEq/L — ABNORMAL LOW (ref 135–145)
Total Bilirubin: 0.9 mg/dL (ref 0.3–1.2)
Total Protein: 6.8 g/dL (ref 6.0–8.3)

## 2012-06-16 NOTE — Telephone Encounter (Signed)
Pt stated it was time for her bone density  She would like to go to [facility] Last time she went to [place] @ [facility]

## 2012-06-18 ENCOUNTER — Other Ambulatory Visit: Payer: Self-pay

## 2012-06-18 ENCOUNTER — Encounter: Payer: Self-pay | Admitting: Internal Medicine

## 2012-06-18 DIAGNOSIS — E785 Hyperlipidemia, unspecified: Secondary | ICD-10-CM

## 2012-06-19 ENCOUNTER — Other Ambulatory Visit: Payer: Self-pay | Admitting: Internal Medicine

## 2012-06-19 ENCOUNTER — Telehealth: Payer: Self-pay | Admitting: Internal Medicine

## 2012-06-19 ENCOUNTER — Other Ambulatory Visit (INDEPENDENT_AMBULATORY_CARE_PROVIDER_SITE_OTHER): Payer: Medicare Other

## 2012-06-19 DIAGNOSIS — E785 Hyperlipidemia, unspecified: Secondary | ICD-10-CM

## 2012-06-19 DIAGNOSIS — M810 Age-related osteoporosis without current pathological fracture: Secondary | ICD-10-CM

## 2012-06-19 MED ORDER — TEMAZEPAM 15 MG PO CAPS: 15.0000 mg | ORAL_CAPSULE | Freq: Every evening | ORAL | Status: DC | PRN

## 2012-06-19 NOTE — Telephone Encounter (Signed)
Refill request for controlled substance  Temazepam 15 mg cap Sig: take one capsule by mouth at bedtime as needed.

## 2012-06-19 NOTE — Telephone Encounter (Signed)
Refill authorized,  rx on printer

## 2012-06-23 ENCOUNTER — Other Ambulatory Visit: Payer: Self-pay | Admitting: Internal Medicine

## 2012-06-23 ENCOUNTER — Other Ambulatory Visit: Payer: Self-pay

## 2012-06-23 MED ORDER — TEMAZEPAM 15 MG PO CAPS: 15.0000 mg | ORAL_CAPSULE | Freq: Every evening | ORAL | Status: DC | PRN

## 2012-06-23 NOTE — Telephone Encounter (Signed)
Rx Restoril 15 mg faxed to [facility] pharmacy.

## 2012-06-24 ENCOUNTER — Telehealth: Payer: Self-pay

## 2012-06-24 LAB — NMR LIPOPROFILE WITHOUT LIPIDS

## 2012-06-29 ENCOUNTER — Other Ambulatory Visit: Payer: Medicare Other

## 2012-06-29 DIAGNOSIS — E785 Hyperlipidemia, unspecified: Secondary | ICD-10-CM

## 2012-06-30 LAB — NMR LIPOPROFILE WITHOUT LIPIDS
LDL Size: 21.6 nm (ref >20.5)
LP-IR Score: 3 (ref <=45)
Small LDL Particle Number: 117 nmol/L (ref <=527)

## 2012-07-16 ENCOUNTER — Telehealth: Payer: Self-pay | Admitting: Internal Medicine

## 2012-07-16 NOTE — Telephone Encounter (Signed)
Bone Density test was normal.

## 2012-07-16 NOTE — Telephone Encounter (Signed)
Patient notified of normal labs.   

## 2012-07-24 ENCOUNTER — Encounter: Payer: Self-pay | Admitting: Internal Medicine

## 2012-08-10 ENCOUNTER — Ambulatory Visit (INDEPENDENT_AMBULATORY_CARE_PROVIDER_SITE_OTHER): Payer: Medicare Other | Admitting: Internal Medicine

## 2012-08-10 ENCOUNTER — Encounter: Payer: Self-pay | Admitting: Internal Medicine

## 2012-08-10 VITALS — BP 140/78 | HR 95 | Temp 98.2°F | Resp 12 | Ht 63.5 in | Wt 130.5 lb

## 2012-08-10 DIAGNOSIS — R232 Flushing: Secondary | ICD-10-CM

## 2012-08-10 DIAGNOSIS — I10 Essential (primary) hypertension: Secondary | ICD-10-CM

## 2012-08-10 DIAGNOSIS — G4700 Insomnia, unspecified: Secondary | ICD-10-CM

## 2012-08-10 MED ORDER — LOSARTAN POTASSIUM-HCTZ 50-12.5 MG PO TABS: 1.0000 | ORAL_TABLET | Freq: Every day | ORAL | Status: DC

## 2012-08-10 MED ORDER — TEMAZEPAM 7.5 MG PO CAPS: 7.5000 mg | ORAL_CAPSULE | Freq: Every evening | ORAL | Status: DC | PRN

## 2012-08-10 MED ORDER — ESZOPICLONE 2 MG PO TABS: 2.0000 mg | ORAL_TABLET | Freq: Every day | ORAL | Status: DC

## 2012-08-10 NOTE — Patient Instructions (Addendum)
Decrease the temazepam to 7. 5 mg daily for 2 weeks,  Then stop the the lunesta if needed,  sparingly

## 2012-08-10 NOTE — Progress Notes (Signed)
Patient ID: Wendy Love, female   DOB: 05-23-1941, 71 y.o.   MRN: [id number]  Patient Active Problem List  Diagnosis  . UNSPECIFIED HYPOTHYROIDISM  . DYSLIPIDEMIA  . ANXIETY  . INSOMNIA, CHRONIC  . DEPRESSION  . CARPAL TUNNEL SYNDROME  . HYPERTENSION  . SYMPTOMATIC MENOPAUSAL/FEMALE CLIMACTERIC STATES  . DEGENERATIVE JOINT DISEASE, CERVICAL SPINE  . SCIATICA, RIGHT  . UNEQUAL LEG LENGTH  . TREMOR  . COUGH, CHRONIC  . OTHER SYMPTOMS INVOLVING DIGESTIVE SYSTEM OTHER  . PAROXYSMAL TACHYCARDIA  . ANEMIA, IRON DEFICIENCY  . Accelerated hypertension  . Flushing reaction    Subjective:  CC:   Chief Complaint  Patient presents with  . Medication Problem    HPI:   Wendy Love a 71 y.o. female who presents One month follow up on multiple symptoms reported at last visit, including palpitations, elevated blood pressures, and episodes flushing , ringing in the ears,accompanied by a  feeling of weakness and light headedness.  She was given a prescription for transdermal estrogen to try since cardiology evaluation was unrevealing but did not fill it due to cost. No diarrhea, abdominal pain or weight loss. She is requesting medication changes due to Insurance changes  have resulted in higher out of pocket expenses.  Wants to change her diovan to losartan.    Past Medical History  Diagnosis Date  . Hypertension   . Hyperthyroidism   . Depression   . Anxiety   . Rhinosinusitis   . Sciatica   . Bronchitis   . Endometriosis 1974    s/p abdominal surgery, 2nd surgery    Past Surgical History  Procedure Date  . Carpal tunnel release     bilateral  . Appendectomy   . Hemilaminectomy April 2010    L3-L4hemi, L4-L5 microdiskectomy  . Endoscopy/colonoscopy April 2012    Schatski's Ring , small hiatal hernia   The following portions of the patient's history were reviewed and updated as appropriate: Allergies, current medications, and problem list.    Review of  Systems:   Patient denies headache, fevers, malaise, unintentional weight loss, skin rash, eye pain, sinus congestion and sinus pain, sore throat, dysphagia,  hemoptysis , cough, dyspnea, wheezing, chest pain, palpitations, orthopnea, edema, abdominal pain, nausea, melena, diarrhea, constipation, flank pain, dysuria, hematuria, urinary  Frequency, nocturia, numbness, tingling, seizures,  Focal weakness, Loss of consciousness,  Tremor, insomnia, depression, anxiety, and suicidal ideation.     History   Social History  . Marital Status: Married    Spouse Name: N/A    Number of Children: N/A  . Years of Education: N/A   Occupational History  . Not on file.   Social History Main Topics  . Smoking status: Never Smoker   . Smokeless tobacco: Not on file  . Alcohol Use: No  . Drug Use: No  . Sexually Active:    Other Topics Concern  . Not on file   Social History Narrative  . No narrative on file    Objective:  BP 140/78  Pulse 95  Temp 98.2 F (36.8 C) (Oral)  Resp 12  Ht 5' 3.5" (1.613 m)  Wt 130 lb 8 oz (59.194 kg)  BMI 22.75 kg/m2  SpO2 99%  General appearance: alert, cooperative and appears stated age Ears: normal TM's and external ear canals both ears Throat: lips, mucosa, and tongue normal; teeth and gums normal Neck: no adenopathy, no carotid bruit, supple, symmetrical, trachea midline and thyroid not enlarged, symmetric, no tenderness/mass/nodules Back: symmetric,  no curvature. ROM normal. No CVA tenderness. Lungs: clear to auscultation bilaterally Heart: regular rate and rhythm, S1, S2 normal, no murmur, click, rub or gallop Abdomen: soft, non-tender; bowel sounds normal; no masses,  no organomegaly Pulses: 2+ and symmetric Skin: Skin color, texture, turgor normal. No rashes or lesions Lymph nodes: Cervical, supraclavicular, and axillary nodes normal.  Assessment and Plan:  Accelerated hypertension Workup for pheo was negative for metanephrines and  catecholamines .  If symptoms persist will order 24 hr ambuatory bp monitor to assess.  Change in medicaiton for cost savings from diovan to losartan per patient request.   Flushing reaction Without weight loss, post prandial timing,  Unlikely to be carcinoid.  Trial of estrogen patches offered but due to cost patient decided not to try it.  symptoms have reportedly improved with control of hypertension   INSOMNIA, CHRONIC She has been having difficulty weaning herself off of Restoril.  7.5 mg dose prescribed for taper,  Samples of lunesta given for use during wean.    Updated Medication List Outpatient Encounter Prescriptions as of 08/10/2012  Medication Sig Dispense Refill  . ALPHA LIPOIC ACID PO Take 100 mg by mouth.      [name] aspirin 81 MG tablet Take 81 mg by mouth daily.        [name] b complex vitamins tablet Take 1 tablet by mouth daily.        . Calcium-Magnesium 500-250 MG TABS Take by mouth 2 (two) times daily.        . Cholecalciferol (VITAMIN D3) 2000 UNITS TABS Take by mouth daily.        [name] co-enzyme Q-10 30 MG capsule Take 100 mg by mouth 3 (three) times daily.        . cromolyn (NASALCROM) 5.2 MG/ACT nasal spray 1 spray by Nasal route 4 (four) times daily as needed.        [name] levothyroxine (SYNTHROID, LEVOTHROID) 25 MCG tablet Take 1 tablet (25 mcg total) by mouth daily.  30 tablet  0  . Multiple Vitamin (MULTIVITAMIN) tablet Take 1 tablet by mouth daily.      . nebivolol (BYSTOLIC) 10 MG tablet Take 1 tablet (10 mg total) by mouth daily.  90 tablet  3  . NON FORMULARY Tumeric 2000 mg take 1 daily       . Omega-3 Fatty Acids (FISH OIL) 1000 MG CAPS Take by mouth 2 (two) times daily.        [name] PROBIOTIC CAPS Take by mouth daily.        . temazepam (RESTORIL) 7.5 MG capsule Take 1 capsule (7.5 mg total) by mouth at bedtime as needed for sleep.  30 capsule  3  . [DISCONTINUED] hydrochlorothiazide (HYDRODIURIL) 25 MG tablet Take 1 tablet (25 mg total) by mouth daily.  90 tablet  3  .  [DISCONTINUED] temazepam (RESTORIL) 15 MG capsule Take 1 capsule (15 mg total) by mouth at bedtime as needed for sleep.  90 capsule  3  . [DISCONTINUED] valsartan (DIOVAN) 160 MG tablet Take 1 tablet (160 mg total) by mouth daily.  90 tablet  3  . estradiol (VIVELLE-DOT) 0.075 MG/24HR Place 1 patch onto the skin 2 (two) times a week.  8 patch  12  . eszopiclone (LUNESTA) 2 MG TABS Take 1 tablet (2 mg total) by mouth at bedtime. Take immediately before bedtime  15 tablet  0  . losartan-hydrochlorothiazide (HYZAAR) 50-12.5 MG per tablet Take 1 tablet by mouth daily.  90 tablet  3  . [DISCONTINUED] losartan-hydrochlorothiazide (HYZAAR) 50-12.5 MG per tablet Take 1 tablet by mouth daily.  90 tablet  3     Orders Placed This Encounter  Procedures  . HM MAMMOGRAPHY  . HM PAP SMEAR  . HM COLONOSCOPY    No Follow-up on file.

## 2012-08-12 ENCOUNTER — Encounter: Payer: Self-pay | Admitting: Internal Medicine

## 2012-08-12 NOTE — Assessment & Plan Note (Signed)
Workup for pheo was negative for metanephrines and catecholamines .  If symptoms persist will order 24 hr ambuatory bp monitor to assess.  Change in medicaiton for cost savings from diovan to losartan per patient request.

## 2012-08-12 NOTE — Assessment & Plan Note (Signed)
Without weight loss, post prandial timing,  Unlikely to be carcinoid.  Trial of estrogen patches offered but due to cost patient decided not to try it.  symptoms have reportedly improved with control of hypertension

## 2012-08-12 NOTE — Assessment & Plan Note (Signed)
She has been having difficulty weaning herself off of Restoril.  7.5 mg dose prescribed for taper,  Samples of lunesta given for use during wean.

## 2012-08-24 ENCOUNTER — Telehealth: Payer: Self-pay | Admitting: *Deleted

## 2012-08-24 ENCOUNTER — Ambulatory Visit (INDEPENDENT_AMBULATORY_CARE_PROVIDER_SITE_OTHER): Payer: Medicare Other | Admitting: Internal Medicine

## 2012-08-24 ENCOUNTER — Encounter: Payer: Self-pay | Admitting: Internal Medicine

## 2012-08-24 VITALS — BP 138/76 | HR 84 | Temp 98.0°F | Resp 12 | Ht 63.0 in | Wt 130.2 lb

## 2012-08-24 DIAGNOSIS — R300 Dysuria: Secondary | ICD-10-CM

## 2012-08-24 DIAGNOSIS — N390 Urinary tract infection, site not specified: Secondary | ICD-10-CM

## 2012-08-24 LAB — POCT URINALYSIS DIPSTICK
Bilirubin, UA: NEGATIVE
Ketones, UA: NEGATIVE
Leukocytes, UA: NEGATIVE
Nitrite, UA: NEGATIVE
Protein, UA: NEGATIVE

## 2012-08-24 MED ORDER — ESTROGENS, CONJUGATED 0.625 MG/GM VA CREA: TOPICAL_CREAM | VAGINAL | Status: DC

## 2012-08-24 NOTE — Telephone Encounter (Signed)
Did you want a urine culture done? Thank you

## 2012-08-24 NOTE — Progress Notes (Signed)
Patient ID: Wendy Love, female   DOB: [birth year], 71 y.o.   MRN: [id number] Patient Active Problem List  Diagnosis  . UNSPECIFIED HYPOTHYROIDISM  . DYSLIPIDEMIA  . ANXIETY  . INSOMNIA, CHRONIC  . DEPRESSION  . CARPAL TUNNEL SYNDROME  . HYPERTENSION  . SYMPTOMATIC MENOPAUSAL/FEMALE CLIMACTERIC STATES  . DEGENERATIVE JOINT DISEASE, CERVICAL SPINE  . SCIATICA, RIGHT  . UNEQUAL LEG LENGTH  . TREMOR  . COUGH, CHRONIC  . OTHER SYMPTOMS INVOLVING DIGESTIVE SYSTEM OTHER  . PAROXYSMAL TACHYCARDIA  . ANEMIA, IRON DEFICIENCY  . Accelerated hypertension  . Flushing reaction  . Dysuria    Subjective:  CC:   Chief Complaint  Patient presents with  . Urinary Tract Infection    HPI:   Wendy Love a 71 y.o. female who presents with  Increased urinary frequency and urge for the last  8 hours accompanied by mild dysuria. She denies discharge, nausea, fevers, and flank pain.    Past Medical History  Diagnosis Date  . Hypertension   . Hyperthyroidism   . Depression   . Anxiety   . Rhinosinusitis   . Sciatica   . Bronchitis   . Endometriosis 1974    s/p abdominal surgery, 2nd surgery    Past Surgical History  Procedure Date  . Carpal tunnel release     bilateral  . Appendectomy   . Hemilaminectomy April 2010    L3-L4hemi, L4-L5 microdiskectomy  . Endoscopy/colonoscopy April 2012    Schatski's Ring , small hiatal hernia         The following portions of the patient's history were reviewed and updated as appropriate: Allergies, current medications, and problem list.    Review of Systems:   Patient denies headache, fevers, malaise, unintentional weight loss, skin rash, eye pain, sinus congestion and sinus pain, sore throat, dysphagia,  hemoptysis , cough, dyspnea, wheezing, chest pain, palpitations, orthopnea, edema, abdominal pain, nausea, melena, diarrhea, constipation, flank pain, dysuria, hematuria,  numbness, tingling, seizures,  Focal weakness,  Loss of consciousness,  Tremor, insomnia, depression, anxiety, and suicidal ideation.        History   Social History  . Marital Status: Married    Spouse Name: N/A    Number of Children: N/A  . Years of Education: N/A   Occupational History  . Not on file.   Social History Main Topics  . Smoking status: Never Smoker   . Smokeless tobacco: Not on file  . Alcohol Use: No  . Drug Use: No  . Sexually Active:    Other Topics Concern  . Not on file   Social History Narrative  . No narrative on file    Objective:  BP 138/76  Pulse 84  Temp 98 F (36.7 C) (Oral)  Resp 12  Ht 5\' 3"  (1.6 m)  Wt 130 lb 4 oz (59.081 kg)  BMI 23.07 kg/m2  SpO2 98%  General appearance: alert, cooperative and appears stated age Ears: normal TM's and external ear canals both ears Throat: lips, mucosa, and tongue normal; teeth and gums normal Neck: no adenopathy, no carotid bruit, supple, symmetrical, trachea midline and thyroid not enlarged, symmetric, no tenderness/mass/nodules Back: symmetric, no curvature. ROM normal. No CVA tenderness. Lungs: clear to auscultation bilaterally Heart: regular rate and rhythm, S1, S2 normal, no murmur, click, rub or gallop Abdomen: soft, non-tender; bowel sounds normal; no masses,  no organomegaly Pulses: 2+ and symmetric Skin: Skin color, texture, turgor normal. No rashes or lesions Lymph nodes: Cervical, supraclavicular, and  axillary nodes normal.  Assessment and Plan:  Dysuria Her current symptoms were unaccompanied by an abnormal UA. We discussed the possibility that her symptoms are coming from vaginal atrophy. She does have a history of menopausal syndrome but has not starting to work type of hormone therapy yet. Vaginal estrogen was prescribed at this visit. Her she called back 24 hours later with continued symptoms now accompanied by urinary spasm, frequency and  bleeding. I started her empirically on ciprofloxacin and Pyridium and will have her  come in for pelvic exam next week.   Updated Medication List Outpatient Encounter Prescriptions as of 08/24/2012  Medication Sig Dispense Refill  . ALPHA LIPOIC ACID PO Take 100 mg by mouth.      [name] aspirin 81 MG tablet Take 81 mg by mouth daily.        [name] b complex vitamins tablet Take 1 tablet by mouth daily.        . Calcium-Magnesium 500-250 MG TABS Take by mouth 2 (two) times daily.        . Cholecalciferol (VITAMIN D3) 2000 UNITS TABS Take by mouth daily.        [name] co-enzyme Q-10 30 MG capsule Take 100 mg by mouth 3 (three) times daily.        . cromolyn (NASALCROM) 5.2 MG/ACT nasal spray 1 spray by Nasal route 4 (four) times daily as needed.        . eszopiclone (LUNESTA) 2 MG TABS Take 1 tablet (2 mg total) by mouth at bedtime. Take immediately before bedtime  15 tablet  0  . levothyroxine (SYNTHROID, LEVOTHROID) 25 MCG tablet Take 1 tablet (25 mcg total) by mouth daily.  30 tablet  0  . losartan-hydrochlorothiazide (HYZAAR) 50-12.5 MG per tablet Take 1 tablet by mouth daily.  90 tablet  3  . Multiple Vitamin (MULTIVITAMIN) tablet Take 1 tablet by mouth daily.      . nebivolol (BYSTOLIC) 10 MG tablet Take 1 tablet (10 mg total) by mouth daily.  90 tablet  3  . NON FORMULARY Tumeric 2000 mg take 1 daily       . Omega-3 Fatty Acids (FISH OIL) 1000 MG CAPS Take by mouth 2 (two) times daily.        [name] PROBIOTIC CAPS Take by mouth daily.        . temazepam (RESTORIL) 7.5 MG capsule Take 1 capsule (7.5 mg total) by mouth at bedtime as needed for sleep.  30 capsule  3  . [DISCONTINUED] estradiol (VIVELLE-DOT) 0.075 MG/24HR Place 1 patch onto the skin 2 (two) times a week.  8 patch  12  . conjugated estrogens (PREMARIN) vaginal cream Use 1 gram daily for two weeks,  Then reduce use to twice daily  42.5 g  12     Orders Placed This Encounter  Procedures  . Urine culture  . POCT urinalysis dipstick    No Follow-up on file.

## 2012-08-24 NOTE — Patient Instructions (Addendum)
Your symptoms may be coming from vaginal atrophy because your urine was normal  Let's try the vaginal cream first   If your urine culture grows a abacteria I will call you and send an antibiotic to your pharmacy

## 2012-08-25 ENCOUNTER — Telehealth: Payer: Self-pay | Admitting: Internal Medicine

## 2012-08-25 MED ORDER — PHENAZOPYRIDINE HCL 200 MG PO TABS: 200.0000 mg | ORAL_TABLET | Freq: Three times a day (TID) | ORAL | Status: DC | PRN

## 2012-08-25 MED ORDER — ESTRADIOL 0.1 MG/GM VA CREA: TOPICAL_CREAM | VAGINAL | Status: DC

## 2012-08-25 MED ORDER — CIPROFLOXACIN HCL 250 MG PO TABS: 250.0000 mg | ORAL_TABLET | Freq: Two times a day (BID) | ORAL | Status: DC

## 2012-08-25 NOTE — Telephone Encounter (Signed)
Will call pyridium and cipro to [facility] pharmacy for empiric treatment of  urinary spasms.  Change vaginal cream to estrace  And yes the instrucitons should be 1 gram daily for 2 weeks,  Then 1 gram twice  weekly.

## 2012-08-25 NOTE — Telephone Encounter (Signed)
Patient Information:  Caller Name: Zamyah  Phone: [phone]  Patient: Wendy Love, Wendy Love  Gender: Female  DOB: November 29, 1940  Age: 71 Years  PCP: Duncan Dull (Adults only)  Office Follow Up:  Does the office need to follow up with this patient?: Yes  Instructions For The Office: Patient wanted to update provider, since symptoms worse than when seen 08/21/12; urine culture pending krs/can  RN Note:  Seen in office 08/24/12 for urinary symptoms; culture pending.  States onset of suprapubic pressure, urinary spasms/urgency, and blood on wiping.  This is new onset since seen in office.  Afebrile.  Urine culture results not back in Epic yet.  Per protocol, disposition See Today in OFfice; patient prefers info to office for provider review/update.  May reach patient at [phone].  Also would like to clarify Rx for estrogen cream; instructions state to "use 1 gm daily x 2 weeks, then reduce use to twice daily;" wants to know if Dr. Darrick Huntsman meant to "reduce use to twice weekly" instead of "twice daily."  Also prefers estrace cream based on insurance coverage.  krs/can  Symptoms  Reason For Call & Symptoms: urinary symptoms  Reviewed Health History In EMR: Yes  Reviewed Medications In EMR: Yes  Reviewed Allergies In EMR: Yes  Reviewed Surgeries / Procedures: Yes  Date of Onset of Symptoms: 08/24/2012  Guideline(s) Used:  Urination Pain - Female  Disposition Per Guideline:   See Today in Office  Reason For Disposition Reached:   All other females with painful urination, or patient wants to be seen  Advice Given:  N/A  Patient Refused Recommendation:  Patient Will Follow Up With Office Later  Patient wanted to update provider, since symptoms worse than when seen 08/21/12; urine culture pending krs/can

## 2012-08-26 ENCOUNTER — Encounter: Payer: Self-pay | Admitting: Internal Medicine

## 2012-08-26 DIAGNOSIS — R300 Dysuria: Secondary | ICD-10-CM | POA: Insufficient documentation

## 2012-08-26 LAB — URINE CULTURE: Colony Count: NO GROWTH

## 2012-08-26 NOTE — Assessment & Plan Note (Signed)
Her current symptoms were unaccompanied by an abnormal UA. We discussed the possibility that her symptoms are coming from vaginal atrophy. She does have a history of menopausal syndrome but has not starting to work type of hormone therapy yet. Vaginal estrogen was prescribed at this visit. Her she called back 24 hours later with continued symptoms now accompanied by urinary spasm, frequency and  bleeding. I started her empirically on ciprofloxacin and Pyridium and will have her come in for pelvic exam next week.

## 2012-08-27 ENCOUNTER — Other Ambulatory Visit: Payer: Self-pay

## 2012-08-27 NOTE — Telephone Encounter (Signed)
Spoke to patient via phone, advised her that scripts were sent to pharmacy

## 2012-10-21 ENCOUNTER — Ambulatory Visit: Payer: Medicare Other | Admitting: Internal Medicine

## 2012-10-23 ENCOUNTER — Ambulatory Visit: Payer: Medicare Other | Admitting: Internal Medicine

## 2012-11-02 ENCOUNTER — Ambulatory Visit (INDEPENDENT_AMBULATORY_CARE_PROVIDER_SITE_OTHER): Payer: Medicare Other | Admitting: Internal Medicine

## 2012-11-19 ENCOUNTER — Encounter: Payer: Self-pay | Admitting: Internal Medicine

## 2012-11-19 ENCOUNTER — Ambulatory Visit (INDEPENDENT_AMBULATORY_CARE_PROVIDER_SITE_OTHER): Payer: Medicare Other | Admitting: Internal Medicine

## 2012-11-19 VITALS — BP 140/74 | HR 71 | Temp 97.9°F | Resp 16 | Wt 130.8 lb

## 2012-11-19 DIAGNOSIS — R300 Dysuria: Secondary | ICD-10-CM

## 2012-11-19 DIAGNOSIS — E039 Hypothyroidism, unspecified: Secondary | ICD-10-CM

## 2012-11-19 DIAGNOSIS — Z1239 Encounter for other screening for malignant neoplasm of breast: Secondary | ICD-10-CM

## 2012-11-19 DIAGNOSIS — N952 Postmenopausal atrophic vaginitis: Secondary | ICD-10-CM

## 2012-11-19 DIAGNOSIS — I10 Essential (primary) hypertension: Secondary | ICD-10-CM

## 2012-11-19 MED ORDER — DICLOFENAC SODIUM 1 % TD GEL: 2.0000 g | Freq: Four times a day (QID) | TRANSDERMAL | Status: DC

## 2012-11-19 MED ORDER — LEVOTHYROXINE SODIUM 25 MCG PO TABS: 25.0000 ug | ORAL_TABLET | Freq: Every day | ORAL | Status: DC

## 2012-11-19 MED ORDER — LOSARTAN POTASSIUM 50 MG PO TABS: 50.0000 mg | ORAL_TABLET | Freq: Every day | ORAL | Status: DC

## 2012-11-19 MED ORDER — HYDROCHLOROTHIAZIDE 25 MG PO TABS: 25.0000 mg | ORAL_TABLET | Freq: Every day | ORAL | Status: DC

## 2012-11-19 MED ORDER — TEMAZEPAM 15 MG PO CAPS: 15.0000 mg | ORAL_CAPSULE | Freq: Every evening | ORAL | Status: DC | PRN

## 2012-11-19 NOTE — Progress Notes (Signed)
Patient ID: Wendy Love, female   DOB: 05/13/1941, 72 y.o.   MRN: [id number]   Patient Active Problem List  Diagnosis  . UNSPECIFIED HYPOTHYROIDISM  . DYSLIPIDEMIA  . ANXIETY  . INSOMNIA, CHRONIC  . DEPRESSION  . CARPAL TUNNEL SYNDROME  . HYPERTENSION  . SYMPTOMATIC MENOPAUSAL/FEMALE CLIMACTERIC STATES  . DEGENERATIVE JOINT DISEASE, CERVICAL SPINE  . SCIATICA, RIGHT  . UNEQUAL LEG LENGTH  . TREMOR  . COUGH, CHRONIC  . OTHER SYMPTOMS INVOLVING DIGESTIVE SYSTEM OTHER  . PAROXYSMAL TACHYCARDIA  . ANEMIA, IRON DEFICIENCY  . Accelerated hypertension  . Flushing reaction  . Dysuria  . Postmenopausal atrophic vaginitis    Subjective:  CC:   Chief Complaint  Patient presents with  . Follow-up    Vaginal Issues and BP meds    HPI:   Wendy Love a 72 y.o. female who presents for follow up multiple chronic issues.    1) atrophic vaginitis .  She was diagnosed last month when she presented for recurrent dysuria in the absence of any evidence of urinary tract infection. She was started empirically on Estrace cream with instructions to follow up in one month. She has been using estrace cream for the last month and her symptoms have improved.  She has not had dysuria or itching.   2) HTN ;  she has not tolerated the switch from Diovan to losartan HCTZ which was done because of cost. She is taking the medication in the morning and is feeling dizzy but 5 or 6 PM on a regular basis. In the interim she has resumed using hydrochlorothiazide in the morning and taking some leftover Diovan at night and feels better.   3) need for mammogram. Her last mammogram was a [facility] on 11/20/2011 she is requesting an order for subsequent.    Past Medical History  Diagnosis Date  . Hypertension   . Hyperthyroidism   . Depression   . Anxiety   . Rhinosinusitis   . Sciatica   . Bronchitis   . Endometriosis 1974    s/p abdominal surgery, 2nd surgery    Past Surgical  History  Procedure Laterality Date  . Carpal tunnel release      bilateral  . Appendectomy    . Hemilaminectomy  April 2010    L3-L4hemi, L4-L5 microdiskectomy  . Endoscopy/colonoscopy  April 2012    Schatski's Ring , small hiatal hernia  . Abdominal hysterectomy  2000       The following portions of the patient's history were reviewed and updated as appropriate: Allergies, current medications, and problem list.    Review of Systems:   12 Pt  review of systems was negative except those addressed in the HPI,     History   Social History  . Marital Status: Married    Spouse Name: N/A    Number of Children: N/A  . Years of Education: N/A   Occupational History  . Not on file.   Social History Main Topics  . Smoking status: Never Smoker   . Smokeless tobacco: Not on file  . Alcohol Use: No  . Drug Use: No  . Sexually Active:    Other Topics Concern  . Not on file   Social History Narrative  . No narrative on file    Objective:  BP 140/74  Pulse 71  Temp(Src) 97.9 F (36.6 C) (Oral)  Resp 16  Wt 130 lb 12 oz (59.308 kg)  BMI 23.17 kg/m2  SpO2 99%  BP 140/74  Pulse 71  Temp(Src) 97.9 F (36.6 C) (Oral)  Resp 16  Wt 130 lb 12 oz (59.308 kg)  BMI 23.17 kg/m2  SpO2 99%  General Appearance:    Alert, cooperative, no distress, appears stated age  Head:    Normocephalic, without obvious abnormality, atraumatic  Neck:   Supple, symmetrical, trachea midline, no adenopathy;    thyroid:  no enlargement/tenderness/nodules; no carotid   bruit or JVD  Lungs:     Clear to auscultation bilaterally, respirations unlabored      Heart:    Regular rate and rhythm, S1 and S2 normal, no murmur, rub   or gallop  Genitalia:    Normal female without lesion, discharge or tenderness. Uterus surgically absent.  lovaries nonpalpable., vaginala mucosa pale   Extremities:   Extremities normal, atraumatic, no cyanosis or edema  Pulses:   2+ and symmetric all extremities   Skin:   Skin color, texture, turgor normal, no rashes or lesions  Lymph nodes:   Cervical, supraclavicular, and axillary nodes normal  Neurologic:   CNII-XII intact, normal strength, sensation and reflexes    throughout   Assessment and Plan:  UNSPECIFIED HYPOTHYROIDISM TSH was normal in April 2013 and was repeated today. She's within normal range. Refills given.  HYPERTENSION She has not tolerated accommodation therapy with losartan HCTZ. We will split the medications up and have her try hctz in the  morning and losartan the evening to see if she tolerates this better.  Dysuria Repeated urinalyses and cultures negative for infection. Symptoms have improved with treatment of atrophic vaginitis.  Postmenopausal atrophic vaginitis continue twice weekly use of Estrace cream for management of symptoms of dysuria. Pelvic exam was normal today.   Updated Medication List Outpatient Encounter Prescriptions as of 11/19/2012  Medication Sig Dispense Refill  . ALPHA LIPOIC ACID PO Take 100 mg by mouth.      [name] aspirin 81 MG tablet Take 81 mg by mouth daily.        [name] b complex vitamins tablet Take 1 tablet by mouth daily.        . Calcium-Magnesium 500-250 MG TABS Take by mouth 2 (two) times daily.        . Cholecalciferol (VITAMIN D3) 2000 UNITS TABS Take by mouth daily.        [name] co-enzyme Q-10 30 MG capsule Take 100 mg by mouth 3 (three) times daily.        . cromolyn (NASALCROM) 5.2 MG/ACT nasal spray 1 spray by Nasal route 4 (four) times daily as needed.        [name] estradiol (ESTRACE) 0.1 MG/GM vaginal cream 1 gram daily for 2 weeks intravaginally,  Then twice weekly.  42.5 g  1  . estradiol (ESTRACE) 0.1 MG/GM vaginal cream Place 1 g vaginally 2 (two) times a week.      . levothyroxine (SYNTHROID, LEVOTHROID) 25 MCG tablet Take 1 tablet (25 mcg total) by mouth daily.  90 tablet  2  . Multiple Vitamin (MULTIVITAMIN) tablet Take 1 tablet by mouth daily.      . NON FORMULARY Tumeric 2000 mg  take 1 daily       . Omega-3 Fatty Acids (FISH OIL) 1000 MG CAPS Take by mouth 2 (two) times daily.        [name] PROBIOTIC CAPS Take by mouth daily.        . temazepam (RESTORIL) 15 MG capsule Take 1 capsule (15 mg total) by mouth at bedtime as  needed for sleep.  90 capsule  1  . [DISCONTINUED] ciprofloxacin (CIPRO) 250 MG tablet Take 1 tablet (250 mg total) by mouth 2 (two) times daily.  6 tablet  0  . [DISCONTINUED] conjugated estrogens (PREMARIN) vaginal cream Use 1 gram daily for two weeks,  Then reduce use to twice daily  42.5 g  12  . [DISCONTINUED] eszopiclone (LUNESTA) 2 MG TABS Take 1 tablet (2 mg total) by mouth at bedtime. Take immediately before bedtime  15 tablet  0  . [DISCONTINUED] levothyroxine (SYNTHROID, LEVOTHROID) 25 MCG tablet Take 1 tablet (25 mcg total) by mouth daily.  30 tablet  0  . [DISCONTINUED] losartan-hydrochlorothiazide (HYZAAR) 50-12.5 MG per tablet Take 1 tablet by mouth daily.  90 tablet  3  . [DISCONTINUED] nebivolol (BYSTOLIC) 10 MG tablet Take 1 tablet (10 mg total) by mouth daily.  90 tablet  3  . [DISCONTINUED] phenazopyridine (PYRIDIUM) 200 MG tablet Take 1 tablet (200 mg total) by mouth 3 (three) times daily as needed for pain.  10 tablet  0  . [DISCONTINUED] temazepam (RESTORIL) 15 MG capsule Take 15 mg by mouth at bedtime as needed for sleep.      . [DISCONTINUED] temazepam (RESTORIL) 7.5 MG capsule Take 1 capsule (7.5 mg total) by mouth at bedtime as needed for sleep.  30 capsule  3  . diclofenac sodium (VOLTAREN) 1 % GEL Apply 2 g topically 4 (four) times daily.  100 g  1  . hydrochlorothiazide (HYDRODIURIL) 25 MG tablet Take 1 tablet (25 mg total) by mouth daily.  30 tablet  1  . losartan (COZAAR) 50 MG tablet Take 1 tablet (50 mg total) by mouth daily.  30 tablet  1   No facility-administered encounter medications on file as of 11/19/2012.     Orders Placed This Encounter  Procedures  . MM Digital Screening  . TSH    No Follow-up on file.

## 2012-11-19 NOTE — Patient Instructions (Addendum)
We are changing your bp regimen to hct  In the morning (12.5 mg,  1/2 tablet) and losartan 50 mg in the evening  If your bp is consistently > 130/80 after a week,  We can either increase the hct to 25 mg or the losartan to 100 mg daily   Your last thyroid check was April 2013.  We will refill your levothyroxine 90 days once I review today's recheck.

## 2012-11-21 ENCOUNTER — Encounter: Payer: Self-pay | Admitting: Internal Medicine

## 2012-11-21 DIAGNOSIS — N952 Postmenopausal atrophic vaginitis: Secondary | ICD-10-CM | POA: Insufficient documentation

## 2012-11-21 NOTE — Assessment & Plan Note (Signed)
continue twice weekly use of Estrace cream for management of symptoms of dysuria. Pelvic exam was normal today.

## 2012-11-21 NOTE — Assessment & Plan Note (Signed)
She has not tolerated accommodation therapy with losartan HCTZ. We will split the medications up and have her try hctz in the  morning and losartan the evening to see if she tolerates this better.

## 2012-11-21 NOTE — Assessment & Plan Note (Signed)
Repeated urinalyses and cultures negative for infection. Symptoms have improved with treatment of atrophic vaginitis.

## 2012-11-21 NOTE — Assessment & Plan Note (Signed)
TSH was normal in April 2013 and was repeated today. She's within normal range. Refills given.

## 2012-11-30 ENCOUNTER — Ambulatory Visit (INDEPENDENT_AMBULATORY_CARE_PROVIDER_SITE_OTHER): Payer: Medicare Other | Admitting: Internal Medicine

## 2012-11-30 ENCOUNTER — Encounter: Payer: Self-pay | Admitting: Internal Medicine

## 2012-11-30 VITALS — BP 106/66 | HR 76 | Temp 97.6°F | Resp 16 | Ht 63.0 in | Wt 131.5 lb

## 2012-11-30 DIAGNOSIS — I10 Essential (primary) hypertension: Secondary | ICD-10-CM

## 2012-11-30 DIAGNOSIS — Z0000 Encounter for general adult medical examination without abnormal findings: Secondary | ICD-10-CM | POA: Insufficient documentation

## 2012-11-30 DIAGNOSIS — N952 Postmenopausal atrophic vaginitis: Secondary | ICD-10-CM

## 2012-11-30 DIAGNOSIS — M5431 Sciatica, right side: Secondary | ICD-10-CM

## 2012-11-30 DIAGNOSIS — M5430 Sciatica, unspecified side: Secondary | ICD-10-CM

## 2012-11-30 MED ORDER — ESTRADIOL 0.1 MG/GM VA CREA: TOPICAL_CREAM | VAGINAL | Status: DC

## 2012-11-30 MED ORDER — LOSARTAN POTASSIUM 50 MG PO TABS: 100.0000 mg | ORAL_TABLET | Freq: Every day | ORAL | Status: DC

## 2012-11-30 NOTE — Patient Instructions (Signed)
Return in April for a fasting blood check   .,    [facility]ills on [facility]race [facility]  Increased losartan to 100 mg daily (2   50 mg tablets daily )  And rx sent [facility]    Try tramadol for sciatica.  I will be happy to provide something stonger if needed.

## 2012-11-30 NOTE — Progress Notes (Signed)
Patient ID: Wendy Love, female   DOB: Jul 08, 1941, 72 y.o.   MRN: [id number]  The patient is here for annual Medicare wellness examination and management of other chronic and acute problems. She has not had enough time on the new blood pressure regimen but increased her dose to 100 mg losartan.    The risk factors are reflected in the social history.  The roster of all physicians providing medical care to patient - is listed in the Snapshot section of the chart.  Activities of daily living:  The patient is 100% independent in all ADLs: dressing, toileting, feeding as well as independent mobility  Home safety : The patient has smoke detectors in the home. They wear seatbelts.  There are no firearms at home. There is no violence in the home.   There is no risks for hepatitis, STDs or HIV. There is no   history of blood transfusion. They have no travel history to infectious disease endemic areas of the world.  The patient has seen their dentist in the last six month. They have seen their eye doctor in the last year. They admit to slight hearing difficulty with regard to whispered voices and some television programs.  They have deferred audiologic testing in the last year.  They do not  have excessive sun exposure. Discussed the need for sun protection: hats, long sleeves and use of sunscreen if there is significant sun exposure.   Diet: the importance of a healthy diet is discussed. They do have a healthy diet.  The benefits of regular aerobic exercise were discussed. She walks 4 times per week ,  20 minutes.   Depression screen: there are no signs or vegative symptoms of depression- irritability, change in appetite, anhedonia, sadness/tearfullness.  Objective  BP 106/66  Pulse 76  Temp(Src) 97.6 F (36.4 C) (Oral)  Resp 16  Ht 5\' 3"  (1.6 m)  Wt 131 lb 8 oz (59.648 kg)  BMI 23.3 kg/m2  SpO2 99%  General Appearance:    Alert, cooperative, no distress, appears stated age  Head:     Normocephalic, without obvious abnormality, atraumatic  Eyes:    PERRL, conjunctiva/corneas clear, EOM's intact, fundi    benign, both eyes  Ears:    Normal TM's and external ear canals, both ears  Nose:   Nares normal, septum midline, mucosa normal, no drainage    or sinus tenderness  Throat:   Lips, mucosa, and tongue normal; teeth and gums normal  Neck:   Supple, symmetrical, trachea midline, no adenopathy;    thyroid:  no enlargement/tenderness/nodules; no carotid   bruit or JVD  Back:     Symmetric, no curvature, ROM normal, no CVA tenderness  Lungs:     Clear to auscultation bilaterally, respirations unlabored  Chest Wall:    No tenderness or deformity   Heart:    Regular rate and rhythm, S1 and S2 normal, no murmur, rub   or gallop  Breast Exam:    No tenderness, masses, or nipple abnormality  Abdomen:     Soft, non-tender, bowel sounds active all four quadrants,    no masses, no organomegaly  Extremities:   Extremities normal, atraumatic, no cyanosis or edema  Pulses:   2+ and symmetric all extremities  Skin:   Skin color, texture, turgor normal, no rashes or lesions  Lymph nodes:   Cervical, supraclavicular, and axillary nodes normal  Neurologic:   CNII-XII intact, normal strength, sensation and reflexes    throughout  Cognitive assessment: the patient manages all their financial and personal affairs and is actively engaged. They could relate day,date,year and events; recalled 2/3 objects at 3 minutes; performed clock-face test normally.  The following portions of the patient's history were reviewed and updated as appropriate: allergies, current medications, past family history, past medical history,  past surgical history, past social history  and problem list.  Visual acuity was not assessed per patient preference since she has regular follow up with her ophthalmologist. Hearing and body mass index were assessed and reviewed.   During the course of the visit the patient  was educated and counseled about appropriate screening and preventive services including : fall prevention , diabetes screening, nutrition counseling, colorectal cancer screening, and recommended immunizations.    Assessment and Plan  SCIATICA, RIGHT Prior gastritis from nsaid use insurance would not pay for voltaren gel. Will resume tramadol for now   HYPERTENSION Increasing losartan to 100 mg daily   Postmenopausal atrophic vaginitis Continue Estrace cream.   Routine general medical examination at a health care facility Annual exam including breast  Was  done today. Pelvic exam was done last month   Updated Medication List Outpatient Encounter Prescriptions as of 11/30/2012  Medication Sig Dispense Refill  . ALPHA LIPOIC ACID PO Take 100 mg by mouth.      [name] aspirin 81 MG tablet Take 81 mg by mouth daily.        [name] b complex vitamins tablet Take 1 tablet by mouth daily.        . Calcium-Magnesium 500-250 MG TABS Take by mouth 2 (two) times daily.        . Cholecalciferol (VITAMIN D3) 2000 UNITS TABS Take by mouth daily.        [name] co-enzyme Q-10 30 MG capsule Take 100 mg by mouth 3 (three) times daily.        . cromolyn (NASALCROM) 5.2 MG/ACT nasal spray 1 spray by Nasal route 4 (four) times daily as needed.        . hydrochlorothiazide (HYDRODIURIL) 25 MG tablet Take 1 tablet (25 mg total) by mouth daily.  30 tablet  1  . levothyroxine (SYNTHROID, LEVOTHROID) 25 MCG tablet Take 1 tablet (25 mcg total) by mouth daily.  90 tablet  2  . losartan (COZAAR) 50 MG tablet Take 2 tablets (100 mg total) by mouth daily.  180 tablet  2  . Multiple Vitamin (MULTIVITAMIN) tablet Take 1 tablet by mouth daily.      . NON FORMULARY Tumeric 2000 mg take 1 daily       . Omega-3 Fatty Acids (FISH OIL) 1000 MG CAPS Take by mouth 2 (two) times daily.        [name] PROBIOTIC CAPS Take by mouth daily.        . temazepam (RESTORIL) 15 MG capsule Take 1 capsule (15 mg total) by mouth at bedtime as needed for sleep.   90 capsule  1  . [DISCONTINUED] diclofenac sodium (VOLTAREN) 1 % GEL Apply 2 g topically 4 (four) times daily.  100 g  1  . [DISCONTINUED] losartan (COZAAR) 50 MG tablet Take 1 tablet (50 mg total) by mouth daily.  30 tablet  1  . estradiol (ESTRACE) 0.1 MG/GM vaginal cream 1 gram  intravaginally twice weekly.  42.5 g  3  . [DISCONTINUED] estradiol (ESTRACE) 0.1 MG/GM vaginal cream 1 gram daily for 2 weeks intravaginally,  Then twice weekly.  42.5 g  1  . [DISCONTINUED] estradiol (ESTRACE)  0.1 MG/GM vaginal cream Place 1 g vaginally 2 (two) times a week.       No facility-administered encounter medications on file as of 11/30/2012.

## 2012-11-30 NOTE — Assessment & Plan Note (Signed)
Continue Estrace cream.

## 2012-11-30 NOTE — Assessment & Plan Note (Signed)
Annual exam including breast  Was  done today. Pelvic exam was done last month

## 2012-11-30 NOTE — Assessment & Plan Note (Addendum)
Prior gastritis from nsaid use insurance would not pay for voltaren gel. Will resume tramadol for now

## 2012-11-30 NOTE — Assessment & Plan Note (Signed)
Increasing losartan to 100 mg daily

## 2012-12-16 ENCOUNTER — Ambulatory Visit
Admission: RE | Admit: 2012-12-16 | Discharge: 2012-12-16 | Disposition: A | Discharge status: Home / Self Care | Payer: Medicare Other | Source: Ambulatory Visit | Attending: Internal Medicine | Admitting: Internal Medicine

## 2012-12-16 DIAGNOSIS — Z1239 Encounter for other screening for malignant neoplasm of breast: Secondary | ICD-10-CM

## 2012-12-21 ENCOUNTER — Telehealth: Payer: Self-pay | Admitting: *Deleted

## 2012-12-21 DIAGNOSIS — E785 Hyperlipidemia, unspecified: Secondary | ICD-10-CM

## 2012-12-21 DIAGNOSIS — E559 Vitamin D deficiency, unspecified: Secondary | ICD-10-CM

## 2012-12-21 DIAGNOSIS — R5383 Other fatigue: Secondary | ICD-10-CM

## 2012-12-21 DIAGNOSIS — R5381 Other malaise: Secondary | ICD-10-CM

## 2012-12-21 NOTE — Telephone Encounter (Signed)
Pt is coming in tomorrow 04.15.2014 for labs, what labs and dx would you like?

## 2012-12-22 ENCOUNTER — Other Ambulatory Visit (INDEPENDENT_AMBULATORY_CARE_PROVIDER_SITE_OTHER): Payer: Medicare Other

## 2012-12-22 DIAGNOSIS — E559 Vitamin D deficiency, unspecified: Secondary | ICD-10-CM

## 2012-12-22 DIAGNOSIS — E785 Hyperlipidemia, unspecified: Secondary | ICD-10-CM

## 2012-12-22 DIAGNOSIS — R5381 Other malaise: Secondary | ICD-10-CM

## 2012-12-22 DIAGNOSIS — R5383 Other fatigue: Secondary | ICD-10-CM

## 2012-12-22 LAB — CBC WITH DIFFERENTIAL/PLATELET
Basophils Absolute: 0 10*3/uL (ref 0.0–0.1)
Basophils Relative: 1.2 % (ref 0.0–3.0)
Eosinophils Absolute: 0.1 10*3/uL (ref 0.0–0.7)
Eosinophils Relative: 2.5 % (ref 0.0–5.0)
HCT: 39.5 % (ref 36.0–46.0)
Hemoglobin: 13.6 g/dL (ref 12.0–15.0)
Lymphocytes Relative: 29.5 % (ref 12.0–46.0)
Lymphs Abs: 1.2 10*3/uL (ref 0.7–4.0)
MCHC: 34.4 g/dL (ref 30.0–36.0)
MCV: 91.3 fl (ref 78.0–100.0)
Monocytes Absolute: 0.4 10*3/uL (ref 0.1–1.0)
Monocytes Relative: 9.7 % (ref 3.0–12.0)
Neutro Abs: 2.3 10*3/uL (ref 1.4–7.7)
Neutrophils Relative %: 57.1 % (ref 43.0–77.0)
Platelets: 267 10*3/uL (ref 150.0–400.0)
RBC: 4.33 Mil/uL (ref 3.87–5.11)
RDW: 13.4 % (ref 11.5–14.6)
WBC: 4.1 10*3/uL — ABNORMAL LOW (ref 4.5–10.5)

## 2012-12-22 LAB — LIPID PANEL
Cholesterol: 243 mg/dL — ABNORMAL HIGH (ref 0–200)
HDL: 94.7 mg/dL (ref >39.00)
Total CHOL/HDL Ratio: 3
Triglycerides: 59 mg/dL (ref 0.0–149.0)
VLDL: 11.8 mg/dL (ref 0.0–40.0)

## 2012-12-22 LAB — LDL CHOLESTEROL, DIRECT: Direct LDL: 126.6 mg/dL

## 2012-12-23 ENCOUNTER — Encounter: Payer: Self-pay | Admitting: Internal Medicine

## 2012-12-28 ENCOUNTER — Encounter: Payer: Self-pay | Admitting: Internal Medicine

## 2012-12-28 ENCOUNTER — Ambulatory Visit (INDEPENDENT_AMBULATORY_CARE_PROVIDER_SITE_OTHER): Payer: Medicare Other | Admitting: Internal Medicine

## 2012-12-28 VITALS — BP 136/78 | HR 90 | Temp 98.2°F | Resp 14 | Wt 129.5 lb

## 2012-12-28 DIAGNOSIS — R259 Unspecified abnormal involuntary movements: Secondary | ICD-10-CM

## 2012-12-28 DIAGNOSIS — I10 Essential (primary) hypertension: Secondary | ICD-10-CM

## 2012-12-28 NOTE — Patient Instructions (Addendum)
You can take the losartan in the morning with your hctz  Take the calcium and magnesium with lunch and dinner  Return for a fasting glucose and potassium check   Use and OTC   Liquid for earwax  Softening, you can return for cerumen disimpaction   Try Simply Saline to flush pollen from sinuses

## 2012-12-28 NOTE — Assessment & Plan Note (Addendum)
Episodic, may be due to hypoglycemia occurring sporadically vs PSVT.  Discussed diet and food combining. [name] Has already had holter monitoring done without any major arrhythmias are noted.  Se will follow up with Dr. Mariah Milling if persistent.  [name]

## 2012-12-28 NOTE — Progress Notes (Signed)
Patient ID: Wendy Love, female   DOB: [birth year], 72 y.o.   MRN: [id number]   Patient Active Problem List  Diagnosis  . UNSPECIFIED HYPOTHYROIDISM  . DYSLIPIDEMIA  . ANXIETY  . INSOMNIA, CHRONIC  . DEPRESSION  . CARPAL TUNNEL SYNDROME  . HYPERTENSION  . SYMPTOMATIC MENOPAUSAL/FEMALE CLIMACTERIC STATES  . DEGENERATIVE JOINT DISEASE, CERVICAL SPINE  . SCIATICA, RIGHT  . UNEQUAL LEG LENGTH  . TREMOR  . OTHER SYMPTOMS INVOLVING DIGESTIVE SYSTEM OTHER  . PAROXYSMAL TACHYCARDIA  . Postmenopausal atrophic vaginitis  . Routine general medical examination at a health care facility    Subjective:  CC:   Chief Complaint  Patient presents with  . Follow-up    lab and meds    HPI:   Wendy Love a 72 y.o. female who presents for follow up on hypertension..  At last visit losartan was added in the evenings.  She has been checking her BP daily and her highest  readings are always around supper highest was 160  To 170 at which time she feels dizzy and weak more so on the left side   Has recurrent episodes of dizziness ,  Light headedness, y headache ,  bp has been normal at those times    Past Medical History  Diagnosis Date  . Hypertension   . Hyperthyroidism   . Depression   . Anxiety   . Rhinosinusitis   . Sciatica   . Bronchitis   . Endometriosis 1974    s/p abdominal surgery, 2nd surgery    Past Surgical History  Procedure Laterality Date  . Carpal tunnel release      bilateral  . Appendectomy    . Hemilaminectomy  April 2010    L3-L4hemi, L4-L5 microdiskectomy  . Endoscopy/colonoscopy  April 2012    Schatski's Ring , small hiatal hernia  . Abdominal hysterectomy  2000       The following portions of the patient's history were reviewed and updated as appropriate: Allergies, current medications, and problem list.    Review of Systems:   12 Pt  review of systems was negative except those addressed in the HPI,     History   Social  History  . Marital Status: Married    Spouse Name: N/A    Number of Children: N/A  . Years of Education: N/A   Occupational History  . Not on file.   Social History Main Topics  . Smoking status: Never Smoker   . Smokeless tobacco: Not on file  . Alcohol Use: No  . Drug Use: No  . Sexually Active:    Other Topics Concern  . Not on file   Social History Narrative  . No narrative on file    Objective:  BP 136/78  Pulse 90  Temp(Src) 98.2 F (36.8 C) (Oral)  Resp 14  Wt 129 lb 8 oz (58.741 kg)  BMI 22.95 kg/m2  SpO2 98%  General appearance: alert, cooperative and appears stated age Ears: normal TM's and external ear canals both ears Throat: lips, mucosa, and tongue normal; teeth and gums normal Neck: no adenopathy, no carotid bruit, supple, symmetrical, trachea midline and thyroid not enlarged, symmetric, no tenderness/mass/nodules Back: symmetric, no curvature. ROM normal. No CVA tenderness. Lungs: clear to auscultation bilaterally Heart: regular rate and rhythm, S1, S2 normal, no murmur, click, rub or gallop Abdomen: soft, non-tender; bowel sounds normal; no masses,  no organomegaly Pulses: 2+ and symmetric Skin: Skin color, texture, turgor normal. No rashes or  lesions Lymph nodes: Cervical, supraclavicular, and axillary nodes normal.  Assessment and Plan:  HYPERTENSION Moving losartan to morning dosing   TREMOR Episodic, may be due to hypoglycemia occurring sporadically vs PSVT.  Discussed diet and food combining. [name] Has already had holter monitoring done without any major arrhythmias are noted.  Se will follow up with Dr. Mariah Milling if persistent.  [name]    Updated Medication List Outpatient Encounter Prescriptions as of 12/28/2012  Medication Sig Dispense Refill  . ALPHA LIPOIC ACID PO Take 100 mg by mouth.      [name] aspirin 81 MG tablet Take 81 mg by mouth daily.        [name] b complex vitamins tablet Take 1 tablet by mouth daily.        . Calcium-Magnesium 500-250 MG  TABS Take by mouth 2 (two) times daily.        . Cholecalciferol (VITAMIN D3) 2000 UNITS TABS Take by mouth daily.        [name] co-enzyme Q-10 30 MG capsule Take 100 mg by mouth 3 (three) times daily.        . cromolyn (NASALCROM) 5.2 MG/ACT nasal spray 1 spray by Nasal route 4 (four) times daily as needed.        [name] estradiol (ESTRACE) 0.1 MG/GM vaginal cream 1 gram  intravaginally twice weekly.  42.5 g  3  . hydrochlorothiazide (HYDRODIURIL) 25 MG tablet Take 1 tablet (25 mg total) by mouth daily.  30 tablet  1  . levothyroxine (SYNTHROID, LEVOTHROID) 25 MCG tablet Take 1 tablet (25 mcg total) by mouth daily.  90 tablet  2  . losartan (COZAAR) 50 MG tablet Take 2 tablets (100 mg total) by mouth daily.  180 tablet  2  . Multiple Vitamin (MULTIVITAMIN) tablet Take 1 tablet by mouth daily.      . NON FORMULARY Tumeric 2000 mg take 1 daily       . Omega-3 Fatty Acids (FISH OIL) 1000 MG CAPS Take by mouth 2 (two) times daily.        [name] PROBIOTIC CAPS Take by mouth daily.        . temazepam (RESTORIL) 15 MG capsule Take 1 capsule (15 mg total) by mouth at bedtime as needed for sleep.  90 capsule  1   No facility-administered encounter medications on file as of 12/28/2012.     No orders of the defined types were placed in this encounter.    No Follow-up on file.

## 2012-12-28 NOTE — Assessment & Plan Note (Signed)
Moving losartan to morning dosing

## 2012-12-29 ENCOUNTER — Other Ambulatory Visit: Payer: Self-pay | Admitting: *Deleted

## 2012-12-30 ENCOUNTER — Other Ambulatory Visit: Payer: Self-pay | Admitting: *Deleted

## 2012-12-30 MED ORDER — TEMAZEPAM 15 MG PO CAPS: 15.0000 mg | ORAL_CAPSULE | Freq: Every evening | ORAL | Status: DC | PRN

## 2012-12-30 NOTE — Telephone Encounter (Signed)
Ok to refill,  Authorized in epic, can call in 72 with 2 refills

## 2012-12-30 NOTE — Telephone Encounter (Signed)
Pt left VM, requesting refill on Temazepam. Our records show last refilled 11/19/12 #90 with 1 refill. Called [facility] and they show she last filled it 10/10/12 for #90. Ok to refill?

## 2012-12-31 ENCOUNTER — Ambulatory Visit (INDEPENDENT_AMBULATORY_CARE_PROVIDER_SITE_OTHER): Payer: Medicare Other | Admitting: *Deleted

## 2012-12-31 DIAGNOSIS — H6120 Impacted cerumen, unspecified ear: Secondary | ICD-10-CM

## 2012-12-31 DIAGNOSIS — H6121 Impacted cerumen, right ear: Secondary | ICD-10-CM

## 2012-12-31 NOTE — Telephone Encounter (Signed)
RX phoned to pharmacy. Pt notified.

## 2013-01-07 ENCOUNTER — Ambulatory Visit: Payer: Medicare Other

## 2013-01-14 ENCOUNTER — Other Ambulatory Visit: Payer: Medicare Other

## 2013-02-25 ENCOUNTER — Encounter: Payer: Self-pay | Admitting: Adult Health

## 2013-02-25 ENCOUNTER — Ambulatory Visit (INDEPENDENT_AMBULATORY_CARE_PROVIDER_SITE_OTHER): Payer: Medicare Other | Admitting: Adult Health

## 2013-02-25 VITALS — BP 160/80 | HR 71 | Temp 98.0°F | Resp 12 | Wt 128.0 lb

## 2013-02-25 DIAGNOSIS — S6990XA Unspecified injury of unspecified wrist, hand and finger(s), initial encounter: Secondary | ICD-10-CM

## 2013-02-25 DIAGNOSIS — S61209A Unspecified open wound of unspecified finger without damage to nail, initial encounter: Secondary | ICD-10-CM

## 2013-02-25 DIAGNOSIS — Z23 Encounter for immunization: Secondary | ICD-10-CM

## 2013-02-25 DIAGNOSIS — S6980XA Other specified injuries of unspecified wrist, hand and finger(s), initial encounter: Secondary | ICD-10-CM

## 2013-02-25 DIAGNOSIS — S6991XA Unspecified injury of right wrist, hand and finger(s), initial encounter: Secondary | ICD-10-CM | POA: Insufficient documentation

## 2013-02-25 DIAGNOSIS — S61011A Laceration without foreign body of right thumb without damage to nail, initial encounter: Secondary | ICD-10-CM

## 2013-02-25 NOTE — Patient Instructions (Addendum)
  Keep area clean and dry.  Wash with mild soap and water.  You can apply neosporin or polysporin to the area and cover with band aid until laceration heals.  If the area becomes infected - pus drainage, increased inflammation or redness, please call the office.  We gave you your tetanus shot today for your injury.

## 2013-02-25 NOTE — Assessment & Plan Note (Signed)
Slammed car door on right thumb. Keep area clean and dry. Wash with mild soap and water. Apply neosporin or polysporin to the area and cover with band aid until laceration heals. RTC if area becomes infected - pus drainage, increased inflammation or redness. Td given.

## 2013-02-25 NOTE — Progress Notes (Signed)
  Subjective:    Patient ID: Wendy Love, female    DOB: 1940-09-16, 72 y.o.   MRN: [id number]  HPI  Patient is a pleasant 72 year old female who presents to clinic after slamming car door on her right thumb yesterday. She is here for evaluation. She complains of pain at the injury site which is well controlled with ibuprofen. She reports a large bruise on her thumbnail. There was a small laceration that occurred with this injury with minimal bleeding. Patient applied ice to the area immediately after it occurred.    Review of Systems  Right thumb nail with bruising of nail. Small laceration on thumb with little bleeding.     Objective:   Physical Exam  Right thumb small laceration with skin well approximated. No bleeding noted. The thumb nail has ecchymosis covering > 50% of nail. Area surrounding has minimal edema and erythema.       Assessment & Plan:

## 2013-03-15 ENCOUNTER — Encounter: Payer: Self-pay | Admitting: *Deleted

## 2013-03-15 ENCOUNTER — Other Ambulatory Visit (INDEPENDENT_AMBULATORY_CARE_PROVIDER_SITE_OTHER): Payer: Medicare Other

## 2013-03-15 DIAGNOSIS — R5383 Other fatigue: Secondary | ICD-10-CM

## 2013-03-15 DIAGNOSIS — R5381 Other malaise: Secondary | ICD-10-CM

## 2013-03-15 LAB — COMPREHENSIVE METABOLIC PANEL
ALT: 21 U/L (ref 0–35)
AST: 29 U/L (ref 0–37)
Alkaline Phosphatase: 60 U/L (ref 39–117)
Sodium: 134 mEq/L — ABNORMAL LOW (ref 135–145)
Total Bilirubin: 0.6 mg/dL (ref 0.3–1.2)
Total Protein: 6.3 g/dL (ref 6.0–8.3)

## 2013-04-01 ENCOUNTER — Telehealth: Payer: Self-pay | Admitting: Internal Medicine

## 2013-04-01 NOTE — Telephone Encounter (Signed)
Patient notified letter ready for pick up

## 2013-04-01 NOTE — Telephone Encounter (Signed)
I have printed a letter stating okay to participate if you agree placed in red folder.

## 2013-04-01 NOTE — Telephone Encounter (Signed)
Pt needing note from Dr. Darrick Huntsman that states she is able to participate in Silver Sneakers program at [facility].  Pt asking to get this today or tomorrow if possible as she has an appt with them on Monday.  Please contact pt when this is ready to be picked up.

## 2013-04-07 DIAGNOSIS — Z0279 Encounter for issue of other medical certificate: Secondary | ICD-10-CM

## 2013-06-09 ENCOUNTER — Encounter: Payer: Self-pay | Admitting: Cardiovascular Disease

## 2013-06-09 ENCOUNTER — Ambulatory Visit (INDEPENDENT_AMBULATORY_CARE_PROVIDER_SITE_OTHER): Payer: Medicare Other | Admitting: Cardiovascular Disease

## 2013-06-09 VITALS — BP 132/90 | HR 84 | Ht 63.0 in | Wt 129.0 lb

## 2013-06-09 DIAGNOSIS — E785 Hyperlipidemia, unspecified: Secondary | ICD-10-CM

## 2013-06-09 DIAGNOSIS — R002 Palpitations: Secondary | ICD-10-CM

## 2013-06-09 DIAGNOSIS — I479 Paroxysmal tachycardia, unspecified: Secondary | ICD-10-CM

## 2013-06-09 DIAGNOSIS — I10 Essential (primary) hypertension: Secondary | ICD-10-CM

## 2013-06-09 NOTE — Assessment & Plan Note (Signed)
No significant tachycardia. She is off bystolic.

## 2013-06-09 NOTE — Assessment & Plan Note (Addendum)
Cholesterol is high. Suggested she try Red Yeast Rice. She does not want a statin

## 2013-06-09 NOTE — Progress Notes (Signed)
Patient ID: Wendy Love, female    DOB: 12/16/1940, 72 y.o.   MRN: [id number]  HPI Comments: Wendy Love is a very pleasant 72 year old woman with previous symptoms of tachycardia, flushing, chest heaviness, started on bystolic 5 mg daily who presents for routine followup.  Today's visit, she did not take any beta blockers. She is on losartan 100 mg daily. Blood pressures typically run between 120 and 140  She has a multitude of symptoms that she presents with today. She has left side feeling weak at times, left ankle foot swells sometimes in the afternoon, some palpitations pounding to her legs, feet, arms hands, ringing in her ears, feels lightheaded after walking 10 minutes or more, face gets red and hot, hands cold usually in the afternoon or early evening, she's worried about her high pulse pressure has ranged between her systolic pressure and diastolic pressures more than 40.  Denies any tachycardia or palpitations apart from rare episodes. Total cholesterol 240 in the past EKG shows normal sinus rhythm with rate 84 beats per minute, no significant ST or T wave changes  Previous CT scan of the chest showed mild plaquing in the aortic arch, coronary calcium score of 0.  Old EKG today shows normal sinus rhythm with rate 77 beats per minute with no significant ST or T wave changes    Outpatient Encounter Prescriptions as of 06/09/2013  Medication Sig Dispense Refill  . ALPHA LIPOIC ACID PO Take 100 mg by mouth.      [name] aspirin 81 MG tablet Take 81 mg by mouth daily.        [name] b complex vitamins tablet Take 1 tablet by mouth daily.        . Calcium-Magnesium 500-250 MG TABS Take by mouth 2 (two) times daily.        . Cholecalciferol (VITAMIN D3) 2000 UNITS TABS Take by mouth daily.        [name] co-enzyme Q-10 30 MG capsule Take 100 mg by mouth daily.       . cromolyn (NASALCROM) 5.2 MG/ACT nasal spray 1 spray by Nasal route 4 (four) times daily as needed.        . hydrochlorothiazide  (HYDRODIURIL) 25 MG tablet Take 1 tablet (25 mg total) by mouth daily.  30 tablet  1  . levothyroxine (SYNTHROID, LEVOTHROID) 25 MCG tablet Take 1 tablet (25 mcg total) by mouth daily.  90 tablet  2  . losartan (COZAAR) 50 MG tablet Take 2 tablets (100 mg total) by mouth daily.  180 tablet  2  . Multiple Vitamin (MULTIVITAMIN) tablet Take 1 tablet by mouth daily.      . NON FORMULARY Tumeric 2000 mg take 1 daily       . Omega-3 Fatty Acids (FISH OIL) 1000 MG CAPS Take by mouth 2 (two) times daily.        [name] PROBIOTIC CAPS Take by mouth daily.        . temazepam (RESTORIL) 15 MG capsule Take 1 capsule (15 mg total) by mouth at bedtime as needed for sleep.  90 capsule  1  . tretinoin (RETIN-A) 0.025 % cream Apply topically at bedtime.       . [DISCONTINUED] estradiol (ESTRACE) 0.1 MG/GM vaginal cream 1 gram  intravaginally twice weekly.  42.5 g  3    Review of Systems  Constitutional: Negative.        Multitude of issues as detailed in H&P including left-sided weakness, left ankle swelling, palpitations,  ringing in her ears, occasional lightheadedness, face feeling red and hot  HENT: Negative.   Eyes: Negative.   Respiratory: Negative.   Cardiovascular: Negative.   Gastrointestinal: Negative.   Endocrine: Negative.   Musculoskeletal: Negative.   Skin: Negative.   Allergic/Immunologic: Negative.   Neurological: Negative.   Hematological: Negative.   Psychiatric/Behavioral: Negative.   All other systems reviewed and are negative.    BP 132/90  Pulse 84  Ht 5\' 3"  (1.6 m)  Wt 129 lb (58.514 kg)  BMI 22.86 kg/m2  Physical Exam  Nursing note and vitals reviewed. Constitutional: She is oriented to person, place, and time. She appears well-developed and well-nourished.  HENT:  Head: Normocephalic.  Nose: Nose normal.  Mouth/Throat: Oropharynx is clear and moist.  Eyes: Conjunctivae are normal. Pupils are equal, round, and reactive to light.  Neck: Normal range of motion. Neck  supple. No JVD present.  Cardiovascular: Normal rate, regular rhythm, S1 normal, S2 normal, normal heart sounds and intact distal pulses.  Exam reveals no gallop and no friction rub.   No murmur heard. Pulmonary/Chest: Effort normal and breath sounds normal. No respiratory distress. She has no wheezes. She has no rales. She exhibits no tenderness.  Abdominal: Soft. Bowel sounds are normal. She exhibits no distension. There is no tenderness.  Musculoskeletal: Normal range of motion. She exhibits no edema and no tenderness.  Lymphadenopathy:    She has no cervical adenopathy.  Neurological: She is alert and oriented to person, place, and time. Coordination normal.  Skin: Skin is warm and dry. No rash noted. No erythema.  Psychiatric: She has a normal mood and affect. Her behavior is normal. Judgment and thought content normal.    Assessment and Plan

## 2013-06-09 NOTE — Patient Instructions (Addendum)
You are doing well. No medication changes were made.  Please continue to monitor your blood pressure Take bystolic if needed for high blood pressure or for palpitations  Please call us if you have new issues that need to be addressed before your next appt.  Your physician wants you to follow-up in: 12 months.  You will receive a reminder letter in the mail two months in advance. If you don't receive a letter, please call our office to schedule the follow-up appointment.

## 2013-06-09 NOTE — Assessment & Plan Note (Signed)
BP borderline high. She will continue to monitor her pressures. We have suggested if BP runs high, she could restart bystolic PRN.

## 2013-06-29 ENCOUNTER — Other Ambulatory Visit: Payer: Self-pay | Admitting: *Deleted

## 2013-06-29 NOTE — Telephone Encounter (Signed)
Last visit 4/14, refill?

## 2013-06-30 MED ORDER — TEMAZEPAM 15 MG PO CAPS: 15.0000 mg | ORAL_CAPSULE | Freq: Every evening | ORAL | Status: DC | PRN

## 2013-07-15 ENCOUNTER — Other Ambulatory Visit: Payer: Self-pay

## 2013-08-23 ENCOUNTER — Telehealth: Payer: Self-pay | Admitting: Internal Medicine

## 2013-08-23 DIAGNOSIS — E039 Hypothyroidism, unspecified: Secondary | ICD-10-CM

## 2013-08-23 MED ORDER — LOSARTAN POTASSIUM 50 MG PO TABS: 100.0000 mg | ORAL_TABLET | Freq: Every day | ORAL | Status: DC

## 2013-08-23 MED ORDER — HYDROCHLOROTHIAZIDE 25 MG PO TABS: 25.0000 mg | ORAL_TABLET | Freq: Every day | ORAL | Status: DC

## 2013-08-23 MED ORDER — LEVOTHYROXINE SODIUM 25 MCG PO TABS: 25.0000 ug | ORAL_TABLET | Freq: Every day | ORAL | Status: DC

## 2013-08-23 NOTE — Telephone Encounter (Signed)
levothyroxine (SYNTHROID, LEVOTHROID) 25 MCG tablet #90  losartan (COZAAR) 50 MG tablet #180  hydrochlorothiazide (HYDRODIURIL) 25 MG tablet #90

## 2013-08-26 ENCOUNTER — Other Ambulatory Visit: Payer: Self-pay

## 2013-08-27 ENCOUNTER — Other Ambulatory Visit: Payer: Self-pay | Admitting: *Deleted

## 2013-08-27 DIAGNOSIS — E039 Hypothyroidism, unspecified: Secondary | ICD-10-CM

## 2013-08-27 MED ORDER — HYDROCHLOROTHIAZIDE 25 MG PO TABS: 25.0000 mg | ORAL_TABLET | Freq: Every day | ORAL | Status: DC

## 2013-08-27 MED ORDER — LOSARTAN POTASSIUM 50 MG PO TABS: 100.0000 mg | ORAL_TABLET | Freq: Every day | ORAL | Status: DC

## 2013-08-27 MED ORDER — LEVOTHYROXINE SODIUM 25 MCG PO TABS: 25.0000 ug | ORAL_TABLET | Freq: Every day | ORAL | Status: DC

## 2013-08-27 NOTE — Telephone Encounter (Signed)
Pt needs levothyroxine (SYNTHROID, LEVOTHROID) 25 MCG tablet  #90  losartan (COZAAR) 50 MG tablet  #180  hydrochlorothiazide (HYDRODIURIL) 25 MG tablet  #90 Sent to [facility] Rx sent to pharmacy by escript. Pt notified.

## 2013-09-20 ENCOUNTER — Other Ambulatory Visit: Payer: Self-pay | Admitting: Internal Medicine

## 2013-09-21 ENCOUNTER — Other Ambulatory Visit: Payer: Self-pay | Admitting: Internal Medicine

## 2013-09-21 NOTE — Telephone Encounter (Signed)
Ok to refill,  Phone in

## 2013-09-22 ENCOUNTER — Other Ambulatory Visit: Payer: Self-pay | Admitting: Internal Medicine

## 2013-09-22 NOTE — Telephone Encounter (Signed)
Rx phoned to pharmacy.  

## 2013-11-12 ENCOUNTER — Telehealth: Payer: Self-pay | Admitting: Emergency Medicine

## 2013-11-12 NOTE — Telephone Encounter (Signed)
It is too soon,  Her last one was Nov 2013.  She does not need one until Nov 2103 at the earliest

## 2013-11-12 NOTE — Telephone Encounter (Signed)
Pt would like an order for a bone density order to be sent to [facility]. Please advise.

## 2013-11-16 ENCOUNTER — Encounter: Payer: Self-pay | Admitting: Emergency Medicine

## 2013-11-16 ENCOUNTER — Encounter: Payer: Self-pay | Admitting: Adult Health

## 2013-11-16 ENCOUNTER — Ambulatory Visit (INDEPENDENT_AMBULATORY_CARE_PROVIDER_SITE_OTHER): Payer: Medicare PPO | Admitting: Adult Health

## 2013-11-16 VITALS — BP 128/66 | HR 75 | Temp 98.1°F | Wt 132.0 lb

## 2013-11-16 DIAGNOSIS — H9319 Tinnitus, unspecified ear: Secondary | ICD-10-CM

## 2013-11-16 DIAGNOSIS — H6120 Impacted cerumen, unspecified ear: Secondary | ICD-10-CM

## 2013-11-16 DIAGNOSIS — H6123 Impacted cerumen, bilateral: Secondary | ICD-10-CM

## 2013-11-16 DIAGNOSIS — B351 Tinea unguium: Secondary | ICD-10-CM

## 2013-11-16 NOTE — Patient Instructions (Signed)
  I am referring you to ENT for the ringing in your ears and hearing loss  I am referring you to Podiatry for the toe nail fungus.  For allergy symptoms, try over the counter antihistamine such as:   Claritin, allegra or zyrtec. Zyrtec may be slightly sedating but you may not have this effect.  We are cleaning out both of your ears for build up of ear wax.  To prevent wax buildup within the ear:   Use equal parts of water and white vinegar  Soak a cotton ball in the solution and place several drops within the ear  Insert cotton ball in external ear canal and let sit for 30 minutes prior to shower  Remove cotton ball and gently irrigate the ear canal in the shower.  Do not irrigate directly into the ear but rather let it hit the external canal and  irrigate.  For maintenance, this can be done 1 time weekly.

## 2013-11-16 NOTE — Progress Notes (Signed)
Pre visit review using our clinic review tool, if applicable. No additional management support is needed unless otherwise documented below in the visit note. 

## 2013-11-16 NOTE — Telephone Encounter (Signed)
Pt here to see Raquel today, will notify at visit.

## 2013-11-16 NOTE — Progress Notes (Signed)
Patient ID: Wendy Love, female   DOB: 1941/07/04, 73 y.o.   MRN: [id number]    Subjective:    Patient ID: Wendy Love, female    DOB: 1941/04/16, 73 y.o.   MRN: [id number]  HPI  Pt is a pleasant 73 y/o female who presents to clinic with ringing in both ears which began ~ 3 weeks ago. Loss of hearing. She has not taken any new medications including any OTC products.   Past Medical History  Diagnosis Date  . Hypertension   . Hyperthyroidism   . Depression   . Anxiety   . Rhinosinusitis   . Sciatica   . Bronchitis   . Endometriosis 1974    s/p abdominal surgery, 2nd surgery    Current Outpatient Prescriptions on File Prior to Visit  Medication Sig Dispense Refill  . ALPHA LIPOIC ACID PO Take 100 mg by mouth.      [name] aspirin 81 MG tablet Take 81 mg by mouth daily.        [name] b complex vitamins tablet Take 1 tablet by mouth daily.        . Calcium-Magnesium 500-250 MG TABS Take by mouth 2 (two) times daily.        . Cholecalciferol (VITAMIN D3) 2000 UNITS TABS Take by mouth daily.        [name] co-enzyme Q-10 30 MG capsule Take 100 mg by mouth daily.       . cromolyn (NASALCROM) 5.2 MG/ACT nasal spray 1 spray by Nasal route 4 (four) times daily as needed.        . hydrochlorothiazide (HYDRODIURIL) 25 MG tablet Take 1 tablet (25 mg total) by mouth daily.  90 tablet  0  . levothyroxine (SYNTHROID, LEVOTHROID) 25 MCG tablet Take 1 tablet (25 mcg total) by mouth daily.  90 tablet  1  . losartan (COZAAR) 50 MG tablet Take 2 tablets (100 mg total) by mouth daily.  180 tablet  0  . Multiple Vitamin (MULTIVITAMIN) tablet Take 1 tablet by mouth daily.      . NON FORMULARY Tumeric 2000 mg take 1 daily       . Omega-3 Fatty Acids (FISH OIL) 1000 MG CAPS Take by mouth 2 (two) times daily.        [name] PROBIOTIC CAPS Take by mouth daily.        . temazepam (RESTORIL) 15 MG capsule TAKE ONE CAPSULE BY MOUTH AT BEDTIME DAILY AS NEEDED FOR SLEEP  90 capsule  1  . tretinoin (RETIN-A) 0.025 %  cream Apply topically at bedtime.        No current facility-administered medications on file prior to visit.     Review of Systems  Constitutional: Negative for fever and chills.  HENT: Positive for hearing loss, postnasal drip, sinus pressure and tinnitus (bilaterally). Negative for congestion, ear discharge and ear pain.   Respiratory: Negative.   Cardiovascular: Negative.   Skin:       Left toe nail fungus, thick  Neurological: Negative.   Psychiatric/Behavioral: Negative.        Objective:  BP 128/66  Pulse 75  Temp(Src) 98.1 F (36.7 C) (Oral)  Wt 132 lb (59.875 kg)  SpO2 98%   Physical Exam  Constitutional: She is oriented to person, place, and time. She appears well-developed and well-nourished.  HENT:  Head: Normocephalic and atraumatic.  Bilateral ears occluded with cerumen. Cobblestone appearance posterior pharynx  Pulmonary/Chest: Effort normal. No respiratory distress.  Neurological: She is alert  and oriented to person, place, and time.  Skin:  Onychomycosis left great toe  Psychiatric: She has a normal mood and affect. Her behavior is normal. Judgment and thought content normal.       Assessment & Plan:   1. Bilateral impacted cerumen Irrigate bilateral ears. Provided with information on ear wax prevention.  2. Onychomycosis of toenail L great toe nail with onychomycosis. Refer to podiatry  3. Tinnitus Started approximately 3 weeks ago. No new meds or OTC meds. Reports some hearing loss; however, significant buildup of ear wax may be contributing to this somewhat. Refer to ENT for tinnitus.

## 2013-11-23 ENCOUNTER — Other Ambulatory Visit: Payer: Self-pay

## 2013-11-23 DIAGNOSIS — Z1231 Encounter for screening mammogram for malignant neoplasm of breast: Secondary | ICD-10-CM

## 2013-11-24 ENCOUNTER — Ambulatory Visit: Payer: Medicare Other | Admitting: Internal Medicine

## 2013-11-25 ENCOUNTER — Encounter: Payer: Self-pay | Admitting: Internal Medicine

## 2013-11-25 ENCOUNTER — Ambulatory Visit (INDEPENDENT_AMBULATORY_CARE_PROVIDER_SITE_OTHER): Payer: Medicare PPO | Admitting: Internal Medicine

## 2013-11-25 VITALS — BP 156/82 | HR 75 | Temp 98.0°F | Resp 16 | Wt 132.8 lb

## 2013-11-25 DIAGNOSIS — G4700 Insomnia, unspecified: Secondary | ICD-10-CM

## 2013-11-25 DIAGNOSIS — H9319 Tinnitus, unspecified ear: Secondary | ICD-10-CM

## 2013-11-25 DIAGNOSIS — R5381 Other malaise: Secondary | ICD-10-CM

## 2013-11-25 DIAGNOSIS — E039 Hypothyroidism, unspecified: Secondary | ICD-10-CM

## 2013-11-25 DIAGNOSIS — I10 Essential (primary) hypertension: Secondary | ICD-10-CM

## 2013-11-25 DIAGNOSIS — R0989 Other specified symptoms and signs involving the circulatory and respiratory systems: Secondary | ICD-10-CM

## 2013-11-25 DIAGNOSIS — E785 Hyperlipidemia, unspecified: Secondary | ICD-10-CM

## 2013-11-25 DIAGNOSIS — I479 Paroxysmal tachycardia, unspecified: Secondary | ICD-10-CM

## 2013-11-25 DIAGNOSIS — E559 Vitamin D deficiency, unspecified: Secondary | ICD-10-CM

## 2013-11-25 DIAGNOSIS — R5383 Other fatigue: Secondary | ICD-10-CM

## 2013-11-25 DIAGNOSIS — Z0000 Encounter for general adult medical examination without abnormal findings: Secondary | ICD-10-CM

## 2013-11-25 MED ORDER — TEMAZEPAM 15 MG PO CAPS: ORAL_CAPSULE | ORAL | Status: DC

## 2013-11-25 NOTE — Patient Instructions (Signed)
  nasalcrom to start with   claritin 10 mg ok to add to nasalcrom  Allegra 180 (fexofenadine) for allergies if claritin doesn't work    We wil refer you for an Ambulatory blood pressure monitor study  to determine  Need for additional control of BP  We will send hctz,  Losartan 100 mg, levothyroxine 25  To right source  Temazepam refill will be sent to to [facility]

## 2013-11-25 NOTE — Progress Notes (Signed)
Pre-visit discussion using our clinic review tool. No additional management support is needed unless otherwise documented below in the visit note.  

## 2013-11-25 NOTE — Progress Notes (Signed)
Patient ID: Wendy Love, female   DOB:   Patient Active Problem List   Diagnosis Date Noted  . Bilateral impacted cerumen 11/16/2013  . Onychomycosis of toenail 11/16/2013  . Tinnitus 11/16/2013  . Injury of right thumb 02/25/2013  . Routine general medical examination at a health care facility 11/30/2012  . Postmenopausal atrophic vaginitis 11/21/2012  . PAROXYSMAL TACHYCARDIA 11/13/2010  . OTHER SYMPTOMS INVOLVING DIGESTIVE SYSTEM OTHER 09/14/2010  . DYSLIPIDEMIA 03/21/2010  . UNEQUAL LEG LENGTH 02/07/2010  . INSOMNIA, CHRONIC 12/19/2009  . TREMOR 12/19/2009  . SCIATICA, RIGHT 09/27/2008  . UNSPECIFIED HYPOTHYROIDISM 10/30/2007  . ANXIETY 10/30/2007  . SYMPTOMATIC MENOPAUSAL/FEMALE CLIMACTERIC STATES 10/30/2007  . DEPRESSION 07/30/2007  . CARPAL TUNNEL SYNDROME 07/30/2007  . HYPERTENSION 07/30/2007  . DEGENERATIVE JOINT DISEASE, CERVICAL SPINE 07/30/2007    Subjective:  CC:   Chief Complaint  Patient presents with  . Follow-up  . Hypertension  . Thyroid Problem    HPI:   Wendy Love is a 73 y.o. female who presents for Follow up on multiple issues including hypertension ,  Cerumen impaction ,palpitations and tinnitus.    BPHome readings fluctuate markedly with systolics range from 564 to   177/81  Discussed ambulator yBP monitor  Which she agrees  Had cerumen impaction with loss of hearing resolved  With flusihing and liquid colace.  Still having tinnitus,  Has been Referred to [facility] but not seen yet.  Occurs constantly  Some hearing loss noted,  cobblestone throat  Noted on prior exam.  paten has allergic rhiniti  Sebaceus cyst on middle of back wants it remvoed.    Past Medical History  Diagnosis Date  . Hypertension   . Hyperthyroidism   . Depression   . Anxiety   . Rhinosinusitis   . Sciatica   . Bronchitis   . Endometriosis 1974    s/p abdominal surgery, 2nd surgery    Past Surgical History  Procedure Laterality Date  .  Carpal tunnel release      bilateral  . Appendectomy    . Hemilaminectomy  April 2010    L3-L4hemi, L4-L5 microdiskectomy  . Endoscopy/colonoscopy  April 2012    Schatski's Ring , small hiatal hernia  . Abdominal hysterectomy  2000       The following portions of the patient's history were reviewed and updated as appropriate: Allergies, current medications, and problem list.    Review of Systems:   Patient denies headache, fevers, malaise, unintentional weight loss, skin rash, eye pain, sinus congestion and sinus pain, sore throat, dysphagia,  hemoptysis , cough, dyspnea, wheezing, chest pain, palpitations, orthopnea, edema, abdominal pain, nausea, melena, diarrhea, constipation, flank pain, dysuria, hematuria, urinary  Frequency, nocturia, numbness, tingling, seizures,  Focal weakness, Loss of consciousness,  Tremor, insomnia, depression, anxiety, and suicidal ideation.     History   Social History  . Marital Status: Married    Spouse Name: N/A    Number of Children: N/A  . Years of Education: N/A   Occupational History  . Not on file.   Social History Main Topics  . Smoking status: Never Smoker   . Smokeless tobacco: Not on file  . Alcohol Use: No  . Drug Use: No  . Sexual Activity:    Other Topics Concern  . Not on file   Social History Narrative  . No narrative on file    Objective:  Filed Vitals:   11/25/13 1536  BP: 156/82  Pulse: 75  Temp: 98 F (  36.7 C)  Resp: 16     General appearance: alert, cooperative and appears stated age Ears: normal TM's and external ear canals both ears Throat: lips, mucosa, and tongue normal; teeth and gums normal Neck: no adenopathy, no carotid bruit, supple, symmetrical, trachea midline and thyroid not enlarged, symmetric, no tenderness/mass/nodules Back: symmetric, no curvature. ROM normal. No CVA tenderness. Lungs: clear to auscultation bilaterally Heart: regular rate and rhythm, S1, S2 normal, no murmur, click,  rub or gallop Abdomen: soft, non-tender; bowel sounds normal; no masses,  no organomegaly Pulses: 2+ and symmetric Skin: Skin color, texture, turgor normal. No rashes or lesions Lymph nodes: Cervical, supraclavicular, and axillary nodes normal.  Assessment and Plan:  HYPERTENSION  Labile on current regimen including losartan and hctz, according to home measurements.  Ambulatory 24 monitor ordered  PAROXYSMAL TACHYCARDIA Currently controlled.   UNSPECIFIED HYPOTHYROIDISM Thyroid function is underactive on current dose.  Will increased dose to 50 mcg daily    Lab Results  Component Value Date   TSH 7.69* 11/26/2013     DYSLIPIDEMIA Excellent panel.  No  Medications changes needed Lab Results  Component Value Date   CHOL 244* 11/26/2013   HDL 104.60 11/26/2013   LDLCALC 132* 11/26/2013   LDLDIRECT 126.6 12/22/2012   TRIG 35.0 11/26/2013   CHOLHDL 2 11/26/2013    INSOMNIA, CHRONIC She has been unable to wean herself off  of Restoril.    Updated Medication List Outpatient Encounter Prescriptions as of 11/25/2013  Medication Sig  . aspirin 81 MG tablet Take 81 mg by mouth daily.    [name] b complex vitamins tablet Take 1 tablet by mouth daily.    . Calcium-Magnesium 500-250 MG TABS Take by mouth 2 (two) times daily.    . Cholecalciferol (VITAMIN D3) 2000 UNITS TABS Take by mouth daily.    [name] co-enzyme Q-10 30 MG capsule Take 200 mg by mouth daily.   . cromolyn (NASALCROM) 5.2 MG/ACT nasal spray 1 spray by Nasal route 4 (four) times daily as needed.    . hydrochlorothiazide (HYDRODIURIL) 25 MG tablet Take 1 tablet (25 mg total) by mouth daily.  [name] levothyroxine (SYNTHROID, LEVOTHROID) 50 MCG tablet Take 1 tablet (50 mcg total) by mouth daily.  [name] losartan (COZAAR) 50 MG tablet Take 2 tablets (100 mg total) by mouth daily.  . NON FORMULARY Tumeric 2000 mg take 1 daily   . PROBIOTIC CAPS Take by mouth daily.    . temazepam (RESTORIL) 15 MG capsule TAKE ONE CAPSULE BY MOUTH AT BEDTIME  DAILY AS NEEDED FOR SLEEP  . tretinoin (RETIN-A) 0.025 % cream Apply topically at bedtime.   . [DISCONTINUED] hydrochlorothiazide (HYDRODIURIL) 25 MG tablet Take 1 tablet (25 mg total) by mouth daily.  . [DISCONTINUED] levothyroxine (SYNTHROID, LEVOTHROID) 25 MCG tablet Take 1 tablet (25 mcg total) by mouth daily.  . [DISCONTINUED] losartan (COZAAR) 50 MG tablet Take 2 tablets (100 mg total) by mouth daily.  . [DISCONTINUED] temazepam (RESTORIL) 15 MG capsule TAKE ONE CAPSULE BY MOUTH AT BEDTIME DAILY AS NEEDED FOR SLEEP  . [DISCONTINUED] temazepam (RESTORIL) 15 MG capsule TAKE ONE CAPSULE BY MOUTH AT BEDTIME DAILY AS NEEDED FOR SLEEP  . Multiple Vitamin (MULTIVITAMIN) tablet Take 1 tablet by mouth daily.  . Omega-3 Fatty Acids (FISH OIL) 1000 MG CAPS Take by mouth 2 (two) times daily.    . [DISCONTINUED] ALPHA LIPOIC ACID PO Take 100 mg by mouth.     Orders Placed This Encounter  Procedures  . Microalbumin /  creatinine urine ratio  . CBC with Differential  . Comprehensive metabolic panel  . TSH  . Lipid panel  . Vit D  25 hydroxy (rtn osteoporosis monitoring)  . Ambulatory referral to ENT  . Ambulatory referral to Cardiology    No Follow-up on file.

## 2013-11-26 ENCOUNTER — Other Ambulatory Visit (INDEPENDENT_AMBULATORY_CARE_PROVIDER_SITE_OTHER): Payer: Medicare PPO

## 2013-11-26 ENCOUNTER — Telehealth: Payer: Self-pay | Admitting: Internal Medicine

## 2013-11-26 DIAGNOSIS — E559 Vitamin D deficiency, unspecified: Secondary | ICD-10-CM

## 2013-11-26 DIAGNOSIS — E785 Hyperlipidemia, unspecified: Secondary | ICD-10-CM

## 2013-11-26 DIAGNOSIS — R5383 Other fatigue: Secondary | ICD-10-CM

## 2013-11-26 DIAGNOSIS — E039 Hypothyroidism, unspecified: Secondary | ICD-10-CM

## 2013-11-26 DIAGNOSIS — Z0000 Encounter for general adult medical examination without abnormal findings: Secondary | ICD-10-CM

## 2013-11-26 DIAGNOSIS — R5381 Other malaise: Secondary | ICD-10-CM

## 2013-11-26 DIAGNOSIS — I10 Essential (primary) hypertension: Secondary | ICD-10-CM

## 2013-11-26 LAB — CBC WITH DIFFERENTIAL/PLATELET
Basophils Absolute: 0 10*3/uL (ref 0.0–0.1)
Basophils Relative: 0.8 % (ref 0.0–3.0)
Eosinophils Absolute: 0.2 10*3/uL (ref 0.0–0.7)
Eosinophils Relative: 4 % (ref 0.0–5.0)
HCT: 40.8 % (ref 36.0–46.0)
Hemoglobin: 14.3 g/dL (ref 12.0–15.0)
Lymphocytes Relative: 27.4 % (ref 12.0–46.0)
Lymphs Abs: 1.1 10*3/uL (ref 0.7–4.0)
MCHC: 34.9 g/dL (ref 30.0–36.0)
MCV: 96.1 fl (ref 78.0–100.0)
MONO ABS: 0.5 10*3/uL (ref 0.1–1.0)
Monocytes Relative: 12.6 % — ABNORMAL HIGH (ref 3.0–12.0)
Neutro Abs: 2.2 10*3/uL (ref 1.4–7.7)
Neutrophils Relative %: 55.2 % (ref 43.0–77.0)
Platelets: 265 10*3/uL (ref 150.0–400.0)
RBC: 4.25 Mil/uL (ref 3.87–5.11)
RDW: 12.2 % (ref 11.5–14.6)
WBC: 4 10*3/uL — ABNORMAL LOW (ref 4.5–10.5)

## 2013-11-26 LAB — MICROALBUMIN / CREATININE URINE RATIO
Creatinine,U: 15.3 mg/dL
MICROALB UR: 0.2 mg/dL (ref 0.0–1.9)
Microalb Creat Ratio: 1.3 mg/g (ref 0.0–30.0)

## 2013-11-26 LAB — COMPREHENSIVE METABOLIC PANEL
ALBUMIN: 4.2 g/dL (ref 3.5–5.2)
ALK PHOS: 56 U/L (ref 39–117)
ALT: 21 U/L (ref 0–35)
AST: 26 U/L (ref 0–37)
BUN: 10 mg/dL (ref 6–23)
CO2: 31 mEq/L (ref 19–32)
Calcium: 9.5 mg/dL (ref 8.4–10.5)
Chloride: 99 mEq/L (ref 96–112)
Creatinine, Ser: 0.6 mg/dL (ref 0.4–1.2)
GFR: 98.31 mL/min (ref >60.00)
GLUCOSE: 82 mg/dL (ref 70–99)
Potassium: 4.4 mEq/L (ref 3.5–5.1)
Sodium: 136 mEq/L (ref 135–145)
TOTAL PROTEIN: 6.7 g/dL (ref 6.0–8.3)
Total Bilirubin: 0.7 mg/dL (ref 0.3–1.2)

## 2013-11-26 LAB — LIPID PANEL
Cholesterol: 244 mg/dL — ABNORMAL HIGH (ref 0–200)
HDL: 104.6 mg/dL (ref >39.00)
LDL CALC: 132 mg/dL — AB (ref 0–99)
Total CHOL/HDL Ratio: 2
Triglycerides: 35 mg/dL (ref 0.0–149.0)
VLDL: 7 mg/dL (ref 0.0–40.0)

## 2013-11-26 LAB — TSH: TSH: 7.69 u[IU]/mL — AB (ref 0.35–5.50)

## 2013-11-26 NOTE — Telephone Encounter (Signed)
Relevant patient education assigned to patient using Emmi. ° °

## 2013-11-27 ENCOUNTER — Encounter: Payer: Self-pay | Admitting: Internal Medicine

## 2013-11-27 LAB — VITAMIN D 25 HYDROXY (VIT D DEFICIENCY, FRACTURES): VIT D 25 HYDROXY: 66 ng/mL (ref 30–89)

## 2013-11-27 MED ORDER — LOSARTAN POTASSIUM 50 MG PO TABS: 100.0000 mg | ORAL_TABLET | Freq: Every day | ORAL | Status: DC

## 2013-11-27 MED ORDER — HYDROCHLOROTHIAZIDE 25 MG PO TABS: 25.0000 mg | ORAL_TABLET | Freq: Every day | ORAL | Status: DC

## 2013-11-27 MED ORDER — LEVOTHYROXINE SODIUM 50 MCG PO TABS: 50.0000 ug | ORAL_TABLET | Freq: Every day | ORAL | Status: DC

## 2013-11-27 MED ORDER — TEMAZEPAM 15 MG PO CAPS: ORAL_CAPSULE | ORAL | Status: DC

## 2013-11-27 NOTE — Assessment & Plan Note (Signed)
Currently controlled.

## 2013-11-27 NOTE — Assessment & Plan Note (Signed)
Thyroid function is underactive on current dose.  Will increased dose to 50 mcg daily    Lab Results  Component Value Date   TSH 7.69* 11/26/2013

## 2013-11-27 NOTE — Assessment & Plan Note (Signed)
Excellent panel.  No  Medications changes needed Lab Results  Component Value Date   CHOL 244* 11/26/2013   HDL 104.60 11/26/2013   LDLCALC 132* 11/26/2013   LDLDIRECT 126.6 12/22/2012   TRIG 35.0 11/26/2013   CHOLHDL 2 11/26/2013

## 2013-11-27 NOTE — Assessment & Plan Note (Signed)
She has been unable to wean herself off  of Restoril.

## 2013-11-27 NOTE — Assessment & Plan Note (Addendum)
  Labile on current regimen including losartan and hctz, according to home measurements.  Ambulatory 24 monitor ordered

## 2013-11-28 ENCOUNTER — Encounter: Payer: Self-pay | Admitting: Internal Medicine

## 2013-11-28 MED ORDER — LEVOTHYROXINE SODIUM 75 MCG PO TABS: 75.0000 ug | ORAL_TABLET | Freq: Every day | ORAL | Status: DC

## 2013-11-28 NOTE — Addendum Note (Signed)
Addended by: Crecencio Mc on: 11/28/2013 11:04 PM   Modules accepted: Orders

## 2013-11-29 ENCOUNTER — Other Ambulatory Visit: Payer: Self-pay | Admitting: Internal Medicine

## 2013-11-29 ENCOUNTER — Telehealth: Payer: Self-pay | Admitting: *Deleted

## 2013-11-29 NOTE — Telephone Encounter (Signed)
Amber sent message need a 24 hr ambulatory blood pressure monitor order for patient, Please advise?

## 2013-11-29 NOTE — Telephone Encounter (Signed)
It is in there as a cardiology referral, bc there is no such order for 24 hr ambulator bp mpnitor

## 2013-11-30 ENCOUNTER — Ambulatory Visit: Payer: Self-pay | Admitting: Podiatry

## 2013-12-03 ENCOUNTER — Telehealth: Payer: Self-pay | Admitting: *Deleted

## 2013-12-03 ENCOUNTER — Telehealth: Payer: Self-pay | Admitting: Emergency Medicine

## 2013-12-03 NOTE — Telephone Encounter (Signed)
Patient called and was concerned as to why she had a office visit set up with [facility]. I explained to her that a referral was placed for a 24 hour BP monitor, however that's an order and cardiology cannot schedule a order off a referral- this information was sent in a referral message as well. I spoke with [place] office and they stated as long as our last office note had mentioned a BP monitor [facility] would proceed with scheduling that. Patient does not believe she needs to see cariology but her BP issue but would rather have just the BP monitor. There is an order in Epic for this and cardiology can schedule off that order if placed, not off the referral. I told the patient we would fix the issue and be in touch.

## 2013-12-03 NOTE — Telephone Encounter (Signed)
Spoke w/ pt.  She reports that she is sched to have a 24 hr BP monitor placed in our office on Monday. Advised her that we do not place those in our office, but they do in the [place] office. She states that she does not want to go to [place]. Advised her that she is sched to see Dr. Rockey Situ on Mon @ 8:00. She asks that I cancel that appt and she will call her PCP's office.

## 2013-12-03 NOTE — Telephone Encounter (Signed)
Please advise does patient need to see cardiology?

## 2013-12-03 NOTE — Telephone Encounter (Signed)
Please call patient she wants to know the best outfit to wear for the heart monitor. Thanks

## 2013-12-03 NOTE — Telephone Encounter (Signed)
Will patient pick up BP monitor at Dr. Donivan Scull?

## 2013-12-03 NOTE — Telephone Encounter (Signed)
No,.  Does not need to see cardiology

## 2013-12-06 ENCOUNTER — Encounter: Payer: Self-pay | Admitting: Internal Medicine

## 2013-12-06 ENCOUNTER — Ambulatory Visit: Payer: Medicare PPO | Admitting: Cardiovascular Disease

## 2013-12-06 NOTE — Telephone Encounter (Signed)
An order will need to be placed for a 24 hour bp monitor, [place] [facility] will order and set up time for pt to come in.

## 2013-12-17 ENCOUNTER — Ambulatory Visit
Admission: RE | Admit: 2013-12-17 | Discharge: 2013-12-17 | Disposition: A | Discharge status: Home / Self Care | Payer: Commercial Managed Care - HMO | Source: Ambulatory Visit

## 2013-12-17 DIAGNOSIS — Z1231 Encounter for screening mammogram for malignant neoplasm of breast: Secondary | ICD-10-CM

## 2013-12-17 LAB — HM MAMMOGRAPHY: HM MAMMO: NORMAL

## 2013-12-31 ENCOUNTER — Telehealth: Payer: Self-pay | Admitting: Emergency Medicine

## 2013-12-31 NOTE — Telephone Encounter (Signed)
Pt approved with 4 visits, exp 03/22/14. auth # [id number]

## 2013-12-31 NOTE — Telephone Encounter (Signed)
Referral underway to [facility] for approval for pt to see [place] [facility]

## 2014-01-07 ENCOUNTER — Encounter: Payer: Self-pay | Admitting: Internal Medicine

## 2014-01-07 ENCOUNTER — Telehealth: Payer: Self-pay | Admitting: Internal Medicine

## 2014-01-07 DIAGNOSIS — E039 Hypothyroidism, unspecified: Secondary | ICD-10-CM

## 2014-01-07 NOTE — Telephone Encounter (Signed)
Sent mychart message

## 2014-01-07 NOTE — Telephone Encounter (Signed)
Patient Information:  Caller Name: Abbigail  Phone: [phone]  Patient: , Wendy Love  Gender: Female  DOB: 1941-04-17  Age: 73 Years  PCP: Deborra Medina (Adults only)  Office Follow Up:  Does the office need to follow up with this patient?: No  Instructions For The Office: N/A  RN Note:  NO call back is needed however, f/u needs to be done regarding request of lab request by pt.  Symptoms  Reason For Call & Symptoms: Pt calling to inform MD that since Levothyroxine was increased from 25 mcg to 75 mcg on 11/25/13, she has been overly tired and not felt well citing: muscle and joint pain and malaise. Asking that a full thyroid panel be done when she comes in next week for f/u labs. Pt states BP has been very good and has only been taking half of Losartin dose with normal BP's. Triage offered and declined.  Reviewed Health History In EMR: Yes  Reviewed Medications In EMR: Yes  Reviewed Allergies In EMR: Yes  Reviewed Surgeries / Procedures: Yes  Date of Onset of Symptoms: 01/07/2014  Guideline(s) Used:  No Protocol Available - Information Only  Disposition Per Guideline:   Discuss with PCP and Callback by Nurse Today  Reason For Disposition Reached:   Nursing judgment  Advice Given:  Call Back If:  New symptoms develop  You become worse.  Patient Will Follow Care Advice:  YES

## 2014-01-07 NOTE — Telephone Encounter (Signed)
Appt 01/17/14 that message is referring to, just [facility]

## 2014-01-07 NOTE — Telephone Encounter (Signed)
The increase in  thyroid dose to 75 mcg was based on records indicating that her prior dose was 50 mcg  Daily  (per EPIC ),  Not 25  So that may be too high a dose.  In looking back it appears that the 50 mcg dose was the dose she received from mail order but never actually started,  And we jumped ahead to 75 mcg.   We will definitely recheck her thyroid next week.

## 2014-01-11 ENCOUNTER — Other Ambulatory Visit (INDEPENDENT_AMBULATORY_CARE_PROVIDER_SITE_OTHER): Payer: Commercial Managed Care - HMO

## 2014-01-11 DIAGNOSIS — E039 Hypothyroidism, unspecified: Secondary | ICD-10-CM

## 2014-01-11 LAB — T4, FREE: FREE T4: 1.43 ng/dL (ref 0.60–1.60)

## 2014-01-11 LAB — TSH: TSH: 0.21 u[IU]/mL — ABNORMAL LOW (ref 0.35–4.50)

## 2014-01-12 ENCOUNTER — Encounter: Payer: Self-pay | Admitting: Internal Medicine

## 2014-01-12 ENCOUNTER — Other Ambulatory Visit: Payer: Medicare PPO

## 2014-01-12 MED ORDER — LEVOTHYROXINE SODIUM 50 MCG PO TABS: 50.0000 ug | ORAL_TABLET | Freq: Every day | ORAL | Status: DC

## 2014-01-12 NOTE — Addendum Note (Signed)
Addended by: Crecencio Mc on: 01/12/2014 07:31 AM   Modules accepted: Orders

## 2014-01-12 NOTE — Assessment & Plan Note (Signed)
Thyroid function is overactive on current 75 mcg daily dose.  I have sent a lower dose of levothyroxine to her pharmacy and would like patient to change immediatley.

## 2014-01-17 ENCOUNTER — Ambulatory Visit (INDEPENDENT_AMBULATORY_CARE_PROVIDER_SITE_OTHER): Payer: Commercial Managed Care - HMO | Admitting: Internal Medicine

## 2014-01-17 ENCOUNTER — Encounter: Payer: Self-pay | Admitting: Internal Medicine

## 2014-01-17 VITALS — BP 148/80 | HR 74 | Temp 98.0°F | Resp 18 | Wt 132.2 lb

## 2014-01-17 DIAGNOSIS — I10 Essential (primary) hypertension: Secondary | ICD-10-CM

## 2014-01-17 DIAGNOSIS — M5430 Sciatica, unspecified side: Secondary | ICD-10-CM

## 2014-01-17 DIAGNOSIS — G4700 Insomnia, unspecified: Secondary | ICD-10-CM

## 2014-01-17 DIAGNOSIS — E039 Hypothyroidism, unspecified: Secondary | ICD-10-CM

## 2014-01-17 MED ORDER — HYDROCHLOROTHIAZIDE 25 MG PO TABS: 25.0000 mg | ORAL_TABLET | Freq: Every day | ORAL | Status: DC

## 2014-01-17 MED ORDER — LOSARTAN POTASSIUM 50 MG PO TABS: 100.0000 mg | ORAL_TABLET | Freq: Every day | ORAL | Status: DC

## 2014-01-17 NOTE — Progress Notes (Signed)
Patient ID: Wendy Love, female   DOB: 07-09-1941, 73 y.o.   MRN: [id number]  Patient Active Problem List   Diagnosis Date Noted  . Bilateral impacted cerumen 11/16/2013  . Onychomycosis of toenail 11/16/2013  . Tinnitus 11/16/2013  . Injury of right thumb 02/25/2013  . Routine general medical examination at a health care facility 11/30/2012  . Postmenopausal atrophic vaginitis 11/21/2012  . PAROXYSMAL TACHYCARDIA 11/13/2010  . OTHER SYMPTOMS INVOLVING DIGESTIVE SYSTEM OTHER 09/14/2010  . DYSLIPIDEMIA 03/21/2010  . UNEQUAL LEG LENGTH 02/07/2010  . INSOMNIA, CHRONIC 12/19/2009  . TREMOR 12/19/2009  . SCIATICA, RIGHT 09/27/2008  . UNSPECIFIED HYPOTHYROIDISM 10/30/2007  . ANXIETY 10/30/2007  . SYMPTOMATIC MENOPAUSAL/FEMALE CLIMACTERIC STATES 10/30/2007  . DEPRESSION 07/30/2007  . CARPAL TUNNEL SYNDROME 07/30/2007  . HYPERTENSION 07/30/2007  . DEGENERATIVE JOINT DISEASE, CERVICAL SPINE 07/30/2007    Subjective:  CC:   Chief Complaint  Patient presents with  . Follow-up    On thyroid? Now hyperthyroidism?    HPI:   ANETHA SLAGEL is a 73 y.o. female who presents for Follow up on Multiple issues raised at last visit 6 weeks ago , including   Insomnia,  overactive thyroid( iatrogenic dose reduced last week) recurrent /chronic back pain with spasms.  1) Insmonia: she remains dependent on temazepam,  Trial of lunesta last month not very effective but worried about staying on temazepam  Because she read it can cuase alzheimers.,  Using SL melatonin,  Sleepy time.  And lunesta,  Benadryl makes her agitated.   2) Back pain,.  Has history of scoliosis,  Her last spine films done in 2010.  considering cold laser therapy by local chiropractor .    3) Thyroid:  TSH was > 7 and dose was increased drom 25 mcg to 75 mcg due to Error facilitated by [employer] and communcatio issues.  Repeat TSH was suppressed to < 1.0 so dose was decreased last week to 50 mcg.  Denies  palpitations,  Wt loss,  hyperdefecation and tremors,  Just feels poorly due to multiple other issues.  Has been reading about thyroid on the internet ,  300 symptoms attributed , wants to know if she should see an endocrinologist.     Past Medical History  Diagnosis Date  . Hypertension   . Hyperthyroidism   . Depression   . Anxiety   . Rhinosinusitis   . Sciatica   . Bronchitis   . Endometriosis 1974    s/p abdominal surgery, 2nd surgery    Past Surgical History  Procedure Laterality Date  . Carpal tunnel release      bilateral  . Appendectomy    . Hemilaminectomy  April 2010    L3-L4hemi, L4-L5 microdiskectomy  . Endoscopy/colonoscopy  April 2012    Schatski's Ring , small hiatal hernia  . Abdominal hysterectomy  2000       The following portions of the patient's history were reviewed and updated as appropriate: Allergies, current medications, and problem list.    Review of Systems:   Patient denies headache, fevers, malaise, unintentional weight loss, skin rash, eye pain, sinus congestion and sinus pain, sore throat, dysphagia,  hemoptysis , cough, dyspnea, wheezing, chest pain, palpitations, orthopnea, edema, abdominal pain, nausea, melena, diarrhea, constipation, flank pain, dysuria, hematuria, urinary  Frequency, nocturia, numbness, tingling, seizures,  Focal weakness, Loss of consciousness,  Tremor, insomnia, depression, anxiety, and suicidal ideation.     History   Social History  . Marital Status: Married  Spouse Name: N/A    Number of Children: N/A  . Years of Education: N/A   Occupational History  . Not on file.   Social History Main Topics  . Smoking status: Never Smoker   . Smokeless tobacco: Not on file  . Alcohol Use: No  . Drug Use: No  . Sexual Activity:    Other Topics Concern  . Not on file   Social History Narrative  . No narrative on file    Objective:  Filed Vitals:   01/17/14 1441  BP: 148/80  Pulse: 74  Temp: 98 F  (36.7 C)  Resp: 18     General appearance: alert, cooperative and appears stated age Ears: normal TM's and external ear canals both ears Throat: lips, mucosa, and tongue normal; teeth and gums normal Neck: no adenopathy, no carotid bruit, supple, symmetrical, trachea midline and thyroid not enlarged, symmetric, no tenderness/mass/nodules Back: symmetric, no curvature. ROM normal. No CVA tenderness. Lungs: clear to auscultation bilaterally Heart: regular rate and rhythm, S1, S2 normal, no murmur, click, rub or gallop Abdomen: soft, non-tender; bowel sounds normal; no masses,  no organomegaly Pulses: 2+ and symmetric Skin: Skin color, texture, turgor normal. No rashes or lesions Lymph nodes: Cervical, supraclavicular, and axillary nodes normal.  Assessment and Plan:  UNSPECIFIED HYPOTHYROIDISM Thyroid function was overactive on current 75 mcg daily dose and she has been taking lower dose since last week.  Advised to suspend med for 4-5 days,  Then resume  50 mcg  And repeat tsh and free t4  In early June.  No endocrinology referral needed unless patient requests it.   HYPERTENSION Labile, Managed with changing 5 to mg to daily losartan and hctz, according to home measurements.  Ambulatory 24 monitor was ordered but patient did not want to travel to [place] to have it placed.  No changes today     SCIATICA, RIGHT Secondary to scoliosis..  Wants to continue seeing chiropractor,.  Recommended PT for core strengthening exercises   INSOMNIA, CHRONIC Reassurance provided.  Continue temazepam prn.    Updated Medication List Outpatient Encounter Prescriptions as of 01/17/2014  Medication Sig  . aspirin 81 MG tablet Take 81 mg by mouth daily.   [name] b complex vitamins tablet Take 1 tablet by mouth daily.    . Calcium-Magnesium 500-250 MG TABS Take by mouth 2 (two) times daily.    . Cholecalciferol (VITAMIN D3) 2000 UNITS TABS Take by mouth daily.    [name] co-enzyme Q-10 30 MG capsule Take 200 mg  by mouth daily.   . hydrochlorothiazide (HYDRODIURIL) 25 MG tablet Take 1 tablet (25 mg total) by mouth daily.  [name] levothyroxine (SYNTHROID, LEVOTHROID) 50 MCG tablet Take 1 tablet (50 mcg total) by mouth daily.  [name] losartan (COZAAR) 50 MG tablet Take 2 tablets (100 mg total) by mouth daily.  . Multiple Vitamin (MULTIVITAMIN) tablet Take 1 tablet by mouth daily.  . NON FORMULARY Tumeric 2000 mg take 1 daily   . PROBIOTIC CAPS Take by mouth daily.    . temazepam (RESTORIL) 15 MG capsule TAKE ONE CAPSULE BY MOUTH AT BEDTIME DAILY AS NEEDED FOR SLEEP  . tretinoin (RETIN-A) 0.025 % cream Apply topically at bedtime.   . Triamcinolone Acetonide (NASACORT ALLERGY 24HR NA) Place 1 spray into the nose daily.  . vitamin C (ASCORBIC ACID) 500 MG tablet Take 500 mg by mouth daily.  . Zinc 25 MG TABS Take 1 tablet by mouth daily.  . [DISCONTINUED] hydrochlorothiazide (HYDRODIURIL) 25 MG  tablet Take 1 tablet (25 mg total) by mouth daily.  . [DISCONTINUED] losartan (COZAAR) 50 MG tablet Take 2 tablets (100 mg total) by mouth daily.  . Omega-3 Fatty Acids (FISH OIL) 1000 MG CAPS Take by mouth 2 (two) times daily.    . [DISCONTINUED] cromolyn (NASALCROM) 5.2 MG/ACT nasal spray 1 spray by Nasal route 4 (four) times daily as needed.       Orders Placed This Encounter  Procedures  . T4, free    No Follow-up on file.

## 2014-01-17 NOTE — Assessment & Plan Note (Addendum)
Thyroid function was overactive on current 75 mcg daily dose and she has been taking lower dose since last week.  Advised to suspend med for 4-5 days,  Then resume  50 mcg  And repeat tsh and free t4  In early June.  No endocrinology referral needed unless patient requests it.

## 2014-01-17 NOTE — Progress Notes (Signed)
Pre-visit discussion using our clinic review tool. No additional management support is needed unless otherwise documented below in the visit note.  

## 2014-01-17 NOTE — Patient Instructions (Signed)
I  will call you with the contact information for Willaim Rayas  The physical therapist  To help strengthen your core  We will repeat your thyroid function in mid June (TSH and Free T4)

## 2014-01-18 NOTE — Assessment & Plan Note (Signed)
Reassurance provided.  Continue temazepam prn.

## 2014-01-18 NOTE — Assessment & Plan Note (Signed)
Secondary to scoliosis..  Wants to continue seeing chiropractor,.  Recommended PT for core strengthening exercises

## 2014-01-18 NOTE — Assessment & Plan Note (Addendum)
Labile, Managed with changing 5 to mg to daily losartan and hctz, according to home measurements.  Ambulatory 24 monitor was ordered but patient did not want to travel to [place] to have it placed.  No changes today

## 2014-01-25 ENCOUNTER — Other Ambulatory Visit: Payer: Self-pay | Admitting: Internal Medicine

## 2014-02-11 ENCOUNTER — Telehealth: Payer: Self-pay

## 2014-02-11 NOTE — Telephone Encounter (Signed)
Patient's request for eye exam silverback approval came through via fax  Dr.Brasington  Auth # - [id number] Exp 08/30/14 , 6 visits

## 2014-02-21 ENCOUNTER — Other Ambulatory Visit (INDEPENDENT_AMBULATORY_CARE_PROVIDER_SITE_OTHER): Payer: Commercial Managed Care - HMO

## 2014-02-21 DIAGNOSIS — E039 Hypothyroidism, unspecified: Secondary | ICD-10-CM

## 2014-02-22 ENCOUNTER — Other Ambulatory Visit: Payer: Commercial Managed Care - HMO

## 2014-02-22 LAB — TSH: TSH: 1.46 u[IU]/mL (ref 0.35–4.50)

## 2014-02-22 LAB — T4, FREE: FREE T4: 0.9 ng/dL (ref 0.60–1.60)

## 2014-02-23 ENCOUNTER — Encounter: Payer: Self-pay | Admitting: Internal Medicine

## 2014-03-25 ENCOUNTER — Other Ambulatory Visit: Payer: Self-pay | Admitting: Internal Medicine

## 2014-04-11 ENCOUNTER — Telehealth: Payer: Self-pay

## 2014-04-11 NOTE — Telephone Encounter (Signed)
The patient has been authorized for 6 visits with Dr.Isenstein  Auth # - [id number] Good Through - 02/24/14 to 08/26/14

## 2014-04-18 ENCOUNTER — Other Ambulatory Visit: Payer: Self-pay | Admitting: Internal Medicine

## 2014-05-28 ENCOUNTER — Other Ambulatory Visit: Payer: Self-pay | Admitting: Internal Medicine

## 2014-05-30 ENCOUNTER — Telehealth: Payer: Self-pay | Admitting: Internal Medicine

## 2014-05-30 ENCOUNTER — Encounter: Payer: Self-pay | Admitting: Internal Medicine

## 2014-05-30 ENCOUNTER — Ambulatory Visit (INDEPENDENT_AMBULATORY_CARE_PROVIDER_SITE_OTHER): Payer: Commercial Managed Care - HMO | Admitting: Internal Medicine

## 2014-05-30 VITALS — BP 164/84 | HR 75 | Temp 97.8°F | Resp 14 | Ht 64.0 in | Wt 122.5 lb

## 2014-05-30 DIAGNOSIS — E039 Hypothyroidism, unspecified: Secondary | ICD-10-CM

## 2014-05-30 DIAGNOSIS — R634 Abnormal weight loss: Secondary | ICD-10-CM

## 2014-05-30 DIAGNOSIS — E785 Hyperlipidemia, unspecified: Secondary | ICD-10-CM

## 2014-05-30 DIAGNOSIS — H6120 Impacted cerumen, unspecified ear: Secondary | ICD-10-CM | POA: Insufficient documentation

## 2014-05-30 DIAGNOSIS — Z0000 Encounter for general adult medical examination without abnormal findings: Secondary | ICD-10-CM

## 2014-05-30 DIAGNOSIS — Z13820 Encounter for screening for osteoporosis: Secondary | ICD-10-CM

## 2014-05-30 DIAGNOSIS — E161 Other hypoglycemia: Secondary | ICD-10-CM | POA: Insufficient documentation

## 2014-05-30 DIAGNOSIS — G4700 Insomnia, unspecified: Secondary | ICD-10-CM

## 2014-05-30 DIAGNOSIS — Z1159 Encounter for screening for other viral diseases: Secondary | ICD-10-CM

## 2014-05-30 DIAGNOSIS — I10 Essential (primary) hypertension: Secondary | ICD-10-CM

## 2014-05-30 DIAGNOSIS — H6121 Impacted cerumen, right ear: Secondary | ICD-10-CM

## 2014-05-30 MED ORDER — ZOLPIDEM TARTRATE 5 MG PO TABS: 5.0000 mg | ORAL_TABLET | Freq: Every evening | ORAL | Status: DC | PRN

## 2014-05-30 NOTE — Telephone Encounter (Signed)
It depends on what her repeat BP measurement is ,  Usually every 6 months to see me,  But she needs to come back this week with her blood pressure monitor  For an RN visit ot have it calibrated.

## 2014-05-30 NOTE — Progress Notes (Signed)
Pre-visit discussion using our clinic review tool. No additional management support is needed unless otherwise documented below in the visit note.  

## 2014-05-30 NOTE — Progress Notes (Signed)
Patient ID: Wendy Love, female   DOB: 11-15-1940, 73 y.o.   MRN: [id number]  The patient is here for annual Medicare wellness examination and management of other chronic and acute problems.  She feels generally well, but is troubled by an inability to wean herself off of temazepam.  The lower dose is cost prohibitive in tablet form.  She is requesting a transition to zolpidem  5 mg as an alternative so that she can reduce the dose gradually in a cost effective manner.   She has had an Intentional weight loss 10 ls giving up sugar and gluten   The risk factors are reflected in the social history.  The roster of all physicians providing medical care to patient - is listed in the Snapshot section of the chart.  Activities of daily living:  The patient is 100% independent in all ADLs: dressing, toileting, feeding as well as independent mobility  Home safety : The patient has smoke detectors in the home. They wear seatbelts.  There are no firearms at home. There is no violence in the home.   There is no risks for hepatitis, STDs or HIV. There is no   history of blood transfusion. They have no travel history to infectious disease endemic areas of the world.  The patient has seen their dentist in the last six month. They have seen their eye doctor in the last year. They admit to slight hearing difficulty with regard to whispered voices and some television programs.  They have deferred audiologic testing in the last year.  They do not  have excessive sun exposure. Discussed the need for sun protection: hats, long sleeves and use of sunscreen if there is significant sun exposure.   Diet: the importance of a healthy diet is discussed. They do have a healthy diet.  The benefits of regular aerobic exercise were discussed. She walks 4 times per week ,  20 minutes.   Depression screen: there are no signs or vegative symptoms of depression- irritability, change in appetite, anhedonia,  sadness/tearfullness.  Cognitive assessment: the patient manages all their financial and personal affairs and is actively engaged. They could relate day,date,year and events; recalled 2/3 objects at 3 minutes; performed clock-face test normally.  The following portions of the patient's history were reviewed and updated as appropriate: allergies, current medications, past family history, past medical history,  past surgical history, past social history  and problem list.  Visual acuity was not assessed per patient preference since she has regular follow up with her ophthalmologist. Hearing and body mass index were assessed and reviewed.   During the course of the visit the patient was educated and counseled about appropriate screening and preventive services including : fall prevention , diabetes screening, nutrition counseling, colorectal cancer screening, and recommended immunizations.    Objective:  BP 164/84  Pulse 75  Temp(Src) 97.8 F (36.6 C) (Oral)  Resp 14  Ht 5\' 4"  (1.626 m)  Wt 122 lb 8 oz (55.566 kg)  BMI 21.02 kg/m2  SpO2 99%  General Appearance:    Alert, cooperative, no distress, appears stated age  Head:    Normocephalic, without obvious abnormality, atraumatic  Eyes:    PERRL, conjunctiva/corneas clear, EOM's intact, fundi    benign, both eyes  Ears:    Normal TM's and external ear canals, both ears  Nose:   Nares normal, septum midline, mucosa normal, no drainage    or sinus tenderness  Throat:   Lips, mucosa, and tongue normal;  teeth and gums normal  Neck:   Supple, symmetrical, trachea midline, no adenopathy;    thyroid:  no enlargement/tenderness/nodules; no carotid   bruit or JVD  Back:     Symmetric, no curvature, ROM normal, no CVA tenderness  Lungs:     Clear to auscultation bilaterally, respirations unlabored  Chest Wall:    No tenderness or deformity   Heart:    Regular rate and rhythm, S1 and S2 normal, no murmur, rub   or gallop  Breast Exam:    No  tenderness, masses, or nipple abnormality  Abdomen:     Soft, non-tender, bowel sounds active all four quadrants,    no masses, no organomegaly     Extremities:   Extremities normal, atraumatic, no cyanosis or edema  Pulses:   2+ and symmetric all extremities  Skin:   Skin color, texture, turgor normal, no rashes or lesions  Lymph nodes:   Cervical, supraclavicular, and axillary nodes normal  Neurologic:   CNII-XII intact, normal strength, sensation and reflexes    throughout   Assessment and Plan:   HYPERTENSION She is only taking 50 mg losartan instead of 100 mg and reports home measurements as < 140/80. .  Asked to return with home bp cuff for calibration.   UNSPECIFIED HYPOTHYROIDISM Thyroid function was WNL on current dose. But she is requesting repeat  check since  she has changed her diet .   Lab Results  Component Value Date   TSH 1.46 02/21/2014     INSOMNIA, CHRONIC Transitioning to zolpidem for weaning trial..  Medicare annual wellness visit, subsequent Annual Medicare wellness  exam was done as well as a comprehensive physical exam and management of acute and chronic conditions .  During the course of the visit the patient was educated and counseled about appropriate screening and preventive services including : fall prevention , diabetes screening, nutrition counseling, colorectal cancer screening, and recommended immunizations.  Printed recommendations for health maintenance screenings was given.    Updated Medication List Outpatient Encounter Prescriptions as of 05/30/2014  Medication Sig  . aspirin 81 MG tablet Take 81 mg by mouth daily.   [name] b complex vitamins tablet Take 1 tablet by mouth daily.    . Calcium-Magnesium 500-250 MG TABS Take by mouth 2 (two) times daily.    . Cholecalciferol (VITAMIN D3) 2000 UNITS TABS Take by mouth daily.    [name] co-enzyme Q-10 30 MG capsule Take 200 mg by mouth daily.   . hydrochlorothiazide (HYDRODIURIL) 25 MG tablet TAKE 1 TABLET  EVERY DAY  . levothyroxine (SYNTHROID, LEVOTHROID) 50 MCG tablet Take 1 tablet (50 mcg total) by mouth daily.  [name] losartan (COZAAR) 50 MG tablet TAKE 2 TABLETS EVERY DAY  . Multiple Vitamin (MULTIVITAMIN) tablet Take 1 tablet by mouth daily.  . NON FORMULARY Tumeric 2000 mg take 1 daily   . Omega-3 Fatty Acids (FISH OIL) 1000 MG CAPS Take by mouth 2 (two) times daily.    [name] PROBIOTIC CAPS Take by mouth daily.    . temazepam (RESTORIL) 15 MG capsule TAKE ONE CAPSULE BY MOUTH AT BEDTIME DAILY AS NEEDED FOR SLEEP  . tretinoin (RETIN-A) 0.025 % cream Apply topically at bedtime.   . Triamcinolone Acetonide (NASACORT ALLERGY 24HR NA) Place 1 spray into the nose daily.  . vitamin C (ASCORBIC ACID) 500 MG tablet Take 500 mg by mouth daily.  . Zinc 25 MG TABS Take 1 tablet by mouth daily.  [name] zolpidem (AMBIEN) 5 MG tablet Take  1 tablet (5 mg total) by mouth at bedtime as needed for sleep.

## 2014-05-30 NOTE — Patient Instructions (Addendum)
We will recheck your thyroid function when you return for fasting labs  Willaim Rayas is the physical therapist I recommended  I will TRY to find her contact info again (she works out at [employer])  Your DEXA is due again in November  A normal fasting blood sugar is 80 to 120.  Anything below 70 is TRUE hypoglycemia  I recommend getting the majority of your calcium and Vitamin D  through diet rather than supplements given the recent association of calcium supplements with increased coronary artery calcium scores (You need 1200 mg daily )   Unsweetened almond/coconut milk is a great low calorie low carb, cholesterol free  way to increase your dietary calcium and vitamin D.  Try the blue Jackquline Bosch or continue with your current coconut milk source   Health Maintenance Adopting a healthy lifestyle and getting preventive care can go a long way to promote health and wellness. Talk with your health care provider about what schedule of regular examinations is right for you. This is a good chance for you to check in with your provider about disease prevention and staying healthy. In between checkups, there are plenty of things you can do on your own. Experts have done a lot of research about which lifestyle changes and preventive measures are most likely to keep you healthy. Ask your health care provider for more information. WEIGHT AND DIET  Eat a healthy diet  Be sure to include plenty of vegetables, fruits, low-fat dairy products, and lean protein.  Do not eat a lot of foods high in solid fats, added sugars, or salt.  Get regular exercise. This is one of the most important things you can do for your health.  Most adults should exercise for at least 150 minutes each week. The exercise should increase your heart rate and make you sweat (moderate-intensity exercise).  Most adults should also do strengthening exercises at least twice a week. This is in addition to the moderate-intensity exercise.   Maintain a healthy weight  Body mass index (BMI) is a measurement that can be used to identify possible weight problems. It estimates body fat based on height and weight. Your health care provider can help determine your BMI and help you achieve or maintain a healthy weight.  For females 80 years of age and older:   A BMI below 18.5 is considered underweight.  A BMI of 18.5 to 24.9 is normal.  A BMI of 25 to 29.9 is considered overweight.  A BMI of 30 and above is considered obese.  Watch levels of cholesterol and blood lipids  You should start having your blood tested for lipids and cholesterol at 74 years of age, then have this test every 5 years.  You may need to have your cholesterol levels checked more often if:  Your lipid or cholesterol levels are high.  You are older than 74 years of age.  You are at high risk for heart disease.  CANCER SCREENING   Lung Cancer  Lung cancer screening is recommended for adults 25-13 years old who are at high risk for lung cancer because of a history of smoking.  A yearly low-dose CT scan of the lungs is recommended for people who:  Currently smoke.  Have quit within the past 15 years.  Have at least a 30-pack-year history of smoking. A pack year is smoking an average of one pack of cigarettes a day for 1 year.  Yearly screening should continue until it has  been 15 years since you quit.  Yearly screening should stop if you develop a health problem that would prevent you from having lung cancer treatment.  Breast Cancer  Practice breast self-awareness. This means understanding how your breasts normally appear and feel.  It also means doing regular breast self-exams. Let your health care provider know about any changes, no matter how small.  If you are in your 20s or 30s, you should have a clinical breast exam (CBE) by a health care provider every 1-3 years as part of a regular health exam.  If you are 36 or older, have a  CBE every year. Also consider having a breast X-ray (mammogram) every year.  If you have a family history of breast cancer, talk to your health care provider about genetic screening.  If you are at high risk for breast cancer, talk to your health care provider about having an MRI and a mammogram every year.  Breast cancer gene (BRCA) assessment is recommended for women who have family members with BRCA-related cancers. BRCA-related cancers include:  Breast.  Ovarian.  Tubal.  Peritoneal cancers.  Results of the assessment will determine the need for genetic counseling and BRCA1 and BRCA2 testing. Cervical Cancer Routine pelvic examinations to screen for cervical cancer are no longer recommended for nonpregnant women who are considered low risk for cancer of the pelvic organs (ovaries, uterus, and vagina) and who do not have symptoms. A pelvic examination may be necessary if you have symptoms including those associated with pelvic infections. Ask your health care provider if a screening pelvic exam is right for you.   The Pap test is the screening test for cervical cancer for women who are considered at risk.  If you had a hysterectomy for a problem that was not cancer or a condition that could lead to cancer, then you no longer need Pap tests.  If you are older than 65 years, and you have had normal Pap tests for the past 10 years, you no longer need to have Pap tests.  If you have had past treatment for cervical cancer or a condition that could lead to cancer, you need Pap tests and screening for cancer for at least 20 years after your treatment.  If you no longer get a Pap test, assess your risk factors if they change (such as having a new sexual partner). This can affect whether you should start being screened again.  Some women have medical problems that increase their chance of getting cervical cancer. If this is the case for you, your health care provider may recommend more  frequent screening and Pap tests.  The human papillomavirus (HPV) test is another test that may be used for cervical cancer screening. The HPV test looks for the virus that can cause cell changes in the cervix. The cells collected during the Pap test can be tested for HPV.  The HPV test can be used to screen women 59 years of age and older. Getting tested for HPV can extend the interval between normal Pap tests from three to five years.  An HPV test also should be used to screen women of any age who have unclear Pap test results.  After 74 years of age, women should have HPV testing as often as Pap tests.  Colorectal Cancer  This type of cancer can be detected and often prevented.  Routine colorectal cancer screening usually begins at 74 years of age and continues through 74 years of age.  Your  health care provider may recommend screening at an earlier age if you have risk factors for colon cancer.  Your health care provider may also recommend using home test kits to check for hidden blood in the stool.  A small camera at the end of a tube can be used to examine your colon directly (sigmoidoscopy or colonoscopy). This is done to check for the earliest forms of colorectal cancer.  Routine screening usually begins at age 54.  Direct examination of the colon should be repeated every 5-10 years through 74 years of age. However, you may need to be screened more often if early forms of precancerous polyps or small growths are found. Skin Cancer  Check your skin from head to toe regularly.  Tell your health care provider about any new moles or changes in moles, especially if there is a change in a mole's shape or color.  Also tell your health care provider if you have a mole that is larger than the size of a pencil eraser.  Always use sunscreen. Apply sunscreen liberally and repeatedly throughout the day.  Protect yourself by wearing long sleeves, pants, a wide-brimmed hat, and sunglasses  whenever you are outside. HEART DISEASE, DIABETES, AND HIGH BLOOD PRESSURE   Have your blood pressure checked at least every 1-2 years. High blood pressure causes heart disease and increases the risk of stroke.  If you are between 87 years and 27 years old, ask your health care provider if you should take aspirin to prevent strokes.  Have regular diabetes screenings. This involves taking a blood sample to check your fasting blood sugar level.  If you are at a normal weight and have a low risk for diabetes, have this test once every three years after 74 years of age.  If you are overweight and have a high risk for diabetes, consider being tested at a younger age or more often. PREVENTING INFECTION  Hepatitis B  If you have a higher risk for hepatitis B, you should be screened for this virus. You are considered at high risk for hepatitis B if:  You were born in a country where hepatitis B is common. Ask your health care provider which countries are considered high risk.  Your parents were born in a high-risk country, and you have not been immunized against hepatitis B (hepatitis B vaccine).  You have HIV or AIDS.  You use needles to inject street drugs.  You live with someone who has hepatitis B.  You have had sex with someone who has hepatitis B.  You get hemodialysis treatment.  You take certain medicines for conditions, including cancer, organ transplantation, and autoimmune conditions. Hepatitis C  Blood testing is recommended for:  Everyone born from [birth year] through 1966.  Anyone with known risk factors for hepatitis C. Sexually transmitted infections (STIs)  You should be screened for sexually transmitted infections (STIs) including gonorrhea and chlamydia if:  You are sexually active and are younger than 74 years of age.  You are older than 74 years of age and your health care provider tells you that you are at risk for this type of infection.  Your sexual activity  has changed since you were last screened and you are at an increased risk for chlamydia or gonorrhea. Ask your health care provider if you are at risk.  If you do not have HIV, but are at risk, it may be recommended that you take a prescription medicine daily to prevent HIV infection. This is called  pre-exposure prophylaxis (PrEP). You are considered at risk if:  You are sexually active and do not regularly use condoms or know the HIV status of your partner(s).  You take drugs by injection.  You are sexually active with a partner who has HIV. Talk with your health care provider about whether you are at high risk of being infected with HIV. If you choose to begin PrEP, you should first be tested for HIV. You should then be tested every 3 months for as long as you are taking PrEP.  PREGNANCY   If you are premenopausal and you may become pregnant, ask your health care provider about preconception counseling.  If you may become pregnant, take 400 to 800 micrograms (mcg) of folic acid every day.  If you want to prevent pregnancy, talk to your health care provider about birth control (contraception). OSTEOPOROSIS AND MENOPAUSE   Osteoporosis is a disease in which the bones lose minerals and strength with aging. This can result in serious bone fractures. Your risk for osteoporosis can be identified using a bone density scan.  If you are 61 years of age or older, or if you are at risk for osteoporosis and fractures, ask your health care provider if you should be screened.  Ask your health care provider whether you should take a calcium or vitamin D supplement to lower your risk for osteoporosis.  Menopause may have certain physical symptoms and risks.  Hormone replacement therapy may reduce some of these symptoms and risks. Talk to your health care provider about whether hormone replacement therapy is right for you.  HOME CARE INSTRUCTIONS   Schedule regular health, dental, and eye  exams.  Stay current with your immunizations.   Do not use any tobacco products including cigarettes, chewing tobacco, or electronic cigarettes.  If you are pregnant, do not drink alcohol.  If you are breastfeeding, limit how much and how often you drink alcohol.  Limit alcohol intake to no more than 1 drink per day for nonpregnant women. One drink equals 12 ounces of beer, 5 ounces of wine, or 1 ounces of hard liquor.  Do not use street drugs.  Do not share needles.  Ask your health care provider for help if you need support or information about quitting drugs.  Tell your health care provider if you often feel depressed.  Tell your health care provider if you have ever been abused or do not feel safe at home. Document Released: 03/11/2011 Document Revised: 01/10/2014 Document Reviewed: 07/28/2013 [facility] Patient Information 2015 [place], [place]. This information is not intended to replace advice given to you by your health care provider. Make sure you discuss any questions you have with your health care provider.

## 2014-05-30 NOTE — Telephone Encounter (Signed)
Pt had an appt today and was wondering when she needed to come back.

## 2014-05-31 NOTE — Assessment & Plan Note (Signed)
Thyroid function was WNL on current dose. But she is requesting repeat  check since  she has changed her diet .   Lab Results  Component Value Date   TSH 1.46 02/21/2014

## 2014-05-31 NOTE — Telephone Encounter (Signed)
I see nurse visit already scheduled, so just schedule her 6 month follow up thanks

## 2014-05-31 NOTE — Assessment & Plan Note (Signed)
Transitioning to zolpidem for weaning trial..

## 2014-05-31 NOTE — Assessment & Plan Note (Signed)

## 2014-05-31 NOTE — Assessment & Plan Note (Signed)
She is only taking 50 mg losartan instead of 100 mg and reports home measurements as < 140/80. .  Asked to return with home bp cuff for calibration.

## 2014-06-01 ENCOUNTER — Other Ambulatory Visit (INDEPENDENT_AMBULATORY_CARE_PROVIDER_SITE_OTHER): Payer: Commercial Managed Care - HMO

## 2014-06-01 ENCOUNTER — Encounter: Payer: Self-pay | Admitting: Internal Medicine

## 2014-06-01 ENCOUNTER — Telehealth: Payer: Self-pay | Admitting: *Deleted

## 2014-06-01 ENCOUNTER — Other Ambulatory Visit: Payer: Self-pay | Admitting: Internal Medicine

## 2014-06-01 ENCOUNTER — Ambulatory Visit (INDEPENDENT_AMBULATORY_CARE_PROVIDER_SITE_OTHER): Payer: Commercial Managed Care - HMO | Admitting: *Deleted

## 2014-06-01 VITALS — BP 142/78 | HR 73

## 2014-06-01 DIAGNOSIS — E039 Hypothyroidism, unspecified: Secondary | ICD-10-CM

## 2014-06-01 DIAGNOSIS — E871 Hypo-osmolality and hyponatremia: Secondary | ICD-10-CM | POA: Insufficient documentation

## 2014-06-01 DIAGNOSIS — Z1159 Encounter for screening for other viral diseases: Secondary | ICD-10-CM

## 2014-06-01 DIAGNOSIS — I10 Essential (primary) hypertension: Secondary | ICD-10-CM

## 2014-06-01 DIAGNOSIS — E161 Other hypoglycemia: Secondary | ICD-10-CM

## 2014-06-01 DIAGNOSIS — R634 Abnormal weight loss: Secondary | ICD-10-CM

## 2014-06-01 DIAGNOSIS — E785 Hyperlipidemia, unspecified: Secondary | ICD-10-CM

## 2014-06-01 LAB — COMPREHENSIVE METABOLIC PANEL
ALBUMIN: 4.4 g/dL (ref 3.5–5.2)
ALK PHOS: 70 U/L (ref 39–117)
ALT: 19 U/L (ref 0–35)
AST: 25 U/L (ref 0–37)
BUN: 10 mg/dL (ref 6–23)
CO2: 32 meq/L (ref 19–32)
Calcium: 9.4 mg/dL (ref 8.4–10.5)
Chloride: 91 mEq/L — ABNORMAL LOW (ref 96–112)
Creatinine, Ser: 0.5 mg/dL (ref 0.4–1.2)
GFR: 119.85 mL/min (ref >60.00)
Glucose, Bld: 86 mg/dL (ref 70–99)
Potassium: 3.7 mEq/L (ref 3.5–5.1)
SODIUM: 127 meq/L — AB (ref 135–145)
TOTAL PROTEIN: 6.9 g/dL (ref 6.0–8.3)
Total Bilirubin: 0.8 mg/dL (ref 0.2–1.2)

## 2014-06-01 LAB — LIPID PANEL
CHOL/HDL RATIO: 3
CHOLESTEROL: 276 mg/dL — AB (ref 0–200)
HDL: 103.8 mg/dL (ref >39.00)
LDL CALC: 163 mg/dL — AB (ref 0–99)
NonHDL: 172.2
TRIGLYCERIDES: 44 mg/dL (ref 0.0–149.0)
VLDL: 8.8 mg/dL (ref 0.0–40.0)

## 2014-06-01 LAB — TSH: TSH: 3.97 u[IU]/mL (ref 0.35–4.50)

## 2014-06-01 LAB — HEMOGLOBIN A1C: Hgb A1c MFr Bld: 5 % (ref 4.6–6.5)

## 2014-06-01 NOTE — Telephone Encounter (Signed)
Pt presents for BP check with home cuff. Verified pt taking Losartan 50 mg daily and HCTZ 25 mg daily. Doing well without complaints. Has not taken BP meds this morning. Home cuff 145/72 HR 76. Manual cuff in office 142/78 HR 73. Advised pt to continue checking pressures at home. Pt dropped off BP readings, given to Dr. Derrel Nip for review. Advised office would call with any further instructions. Also had fasting labs drawn today.

## 2014-06-01 NOTE — Telephone Encounter (Signed)
Her machine is calibrated but I do no have the home readings as referenced below. So based on today's reading I would add an additinanl 50 mg of losartan in the am and continue 50 mg in the PM

## 2014-06-01 NOTE — Telephone Encounter (Signed)
Patient notified and voiced understanding.

## 2014-06-01 NOTE — Progress Notes (Signed)
Pt presents for BP check with home cuff. Verified pt taking Losartan 50 mg daily and HCTZ 25 mg daily. Doing well without complaints. Has not taken BP meds this morning. Home cuff 145/72 HR 76. Manual cuff in office 142/78 HR 73. Advised pt to continue checking pressures at home. Pt dropped off BP readings, given to Dr. Derrel Nip for review. Advised office would call with any further instructions. Also had fasting labs drawn today.

## 2014-06-02 LAB — HEPATITIS C ANTIBODY: HCV Ab: NEGATIVE

## 2014-06-03 ENCOUNTER — Encounter: Payer: Self-pay | Admitting: Internal Medicine

## 2014-06-03 ENCOUNTER — Other Ambulatory Visit: Payer: Self-pay | Admitting: Internal Medicine

## 2014-06-03 DIAGNOSIS — E039 Hypothyroidism, unspecified: Secondary | ICD-10-CM

## 2014-06-03 MED ORDER — LEVOTHYROXINE SODIUM 50 MCG PO TABS: 50.0000 ug | ORAL_TABLET | Freq: Every day | ORAL | Status: DC

## 2014-06-08 ENCOUNTER — Other Ambulatory Visit: Payer: Commercial Managed Care - HMO

## 2014-06-10 ENCOUNTER — Ambulatory Visit (INDEPENDENT_AMBULATORY_CARE_PROVIDER_SITE_OTHER): Payer: Commercial Managed Care - HMO | Admitting: Cardiovascular Disease

## 2014-06-10 ENCOUNTER — Encounter: Payer: Self-pay | Admitting: Cardiovascular Disease

## 2014-06-10 VITALS — BP 130/90 | HR 71 | Ht 63.0 in | Wt 124.2 lb

## 2014-06-10 DIAGNOSIS — F419 Anxiety disorder, unspecified: Secondary | ICD-10-CM

## 2014-06-10 DIAGNOSIS — E871 Hypo-osmolality and hyponatremia: Secondary | ICD-10-CM

## 2014-06-10 DIAGNOSIS — I10 Essential (primary) hypertension: Secondary | ICD-10-CM

## 2014-06-10 DIAGNOSIS — I471 Supraventricular tachycardia: Secondary | ICD-10-CM

## 2014-06-10 NOTE — Patient Instructions (Signed)
You are doing well. No medication changes were made.  Please call us if you have new issues that need to be addressed before your next appt.  Your physician wants you to follow-up in: 12 months.  You will receive a reminder letter in the mail two months in advance. If you don't receive a letter, please call our office to schedule the follow-up appointment. 

## 2014-06-10 NOTE — Progress Notes (Signed)
Patient ID: Wendy Love, female    DOB: May 20, 1941, 73 y.o.   MRN: [id number]  HPI Comments: Wendy Love is a very pleasant 73 year old woman with previous symptoms of tachycardia, flushing, chest heaviness, started on bystolic 5 mg daily who presents for routine followup. In followup visits,  she did not take any beta blockers.  She is on losartan 100 mg daily. Blood pressures typically run between 120 and 140 Calcium score of 0 in 2007, minimal plaquing in the aortic arch  Overall she reports that she feels well. Sodium was low recently and HCTZ was held. Blood pressure has not changed very much Denies any tachycardia or palpitations apart from rare episodes. On a prior visit, she had significant symptoms of weakness, ankle swelling, palpitations, ear ringing, lightheadedness, etc. Today she reports that she feels well  Total cholesterol 276, LDL 163, hemoglobin A1c 5   EKG shows normal sinus rhythm with rate 71 beats per minute, no significant ST or T wave changes  EKG today shows normal sinus rhythm with rate 71 beats per minute with no significant ST or T wave changes    Outpatient Encounter Prescriptions as of 06/10/2014  Medication Sig  . aspirin 81 MG tablet Take 81 mg by mouth daily.   [name] b complex vitamins tablet Take 1 tablet by mouth daily.    . Calcium-Magnesium 500-250 MG TABS Take by mouth 2 (two) times daily.    . Cholecalciferol (VITAMIN D3) 2000 UNITS TABS Take by mouth daily.    [name] co-enzyme Q-10 30 MG capsule Take 200 mg by mouth daily.   [name] levothyroxine (SYNTHROID, LEVOTHROID) 50 MCG tablet Take 1 tablet (50 mcg total) by mouth daily.  [name] losartan (COZAAR) 50 MG tablet TAKE 1 TABLET twice a day.  . Multiple Vitamin (MULTIVITAMIN) tablet Take 1 tablet by mouth daily.  . NON FORMULARY Tumeric 2000 mg take 1 daily   . PROBIOTIC CAPS Take by mouth daily.    [name] tretinoin (RETIN-A) 0.025 % cream Apply topically at bedtime.   . Triamcinolone Acetonide (NASACORT  ALLERGY 24HR NA) Place 1 spray into the nose daily.  . vitamin C (ASCORBIC ACID) 500 MG tablet Take 500 mg by mouth daily.  . Zinc 25 MG TABS Take 1 tablet by mouth daily.  [name] zolpidem (AMBIEN) 5 MG tablet Take 1 tablet (5 mg total) by mouth at bedtime as needed for sleep.    Review of Systems  Constitutional: Negative.   HENT: Negative.   Eyes: Negative.   Respiratory: Negative.   Cardiovascular: Negative.   Gastrointestinal: Negative.   Endocrine: Negative.   Musculoskeletal: Negative.   Skin: Negative.   Allergic/Immunologic: Negative.   Neurological: Negative.   Hematological: Negative.   Psychiatric/Behavioral: Negative.   All other systems reviewed and are negative.   BP 130/90  Pulse 71  Ht 5\' 3"  (1.6 m)  Wt 124 lb 4 oz (56.359 kg)  BMI 22.02 kg/m2  Physical Exam  Nursing note and vitals reviewed. Constitutional: She is oriented to person, place, and time. She appears well-developed and well-nourished.  HENT:  Head: Normocephalic.  Nose: Nose normal.  Mouth/Throat: Oropharynx is clear and moist.  Eyes: Conjunctivae are normal. Pupils are equal, round, and reactive to light.  Neck: Normal range of motion. Neck supple. No JVD present.  Cardiovascular: Normal rate, regular rhythm, S1 normal, S2 normal, normal heart sounds and intact distal pulses.  Exam reveals no gallop and no friction rub.   No murmur heard. Pulmonary/Chest:  Effort normal and breath sounds normal. No respiratory distress. She has no wheezes. She has no rales. She exhibits no tenderness.  Abdominal: Soft. Bowel sounds are normal. She exhibits no distension. There is no tenderness.  Musculoskeletal: Normal range of motion. She exhibits no edema and no tenderness.  Lymphadenopathy:    She has no cervical adenopathy.  Neurological: She is alert and oriented to person, place, and time. Coordination normal.  Skin: Skin is warm and dry. No rash noted. No erythema.  Psychiatric: She has a normal mood and  affect. Her behavior is normal. Judgment and thought content normal.    Assessment and Plan

## 2014-06-12 NOTE — Assessment & Plan Note (Signed)
Blood pressure is well controlled on today's visit. No changes made to the medications. HCTZ recently held, not on beta blockers. Recommended she monitor her blood pressure periodically at home.

## 2014-06-12 NOTE — Assessment & Plan Note (Signed)
HCTZ recently held. She has followup with Dr. Derrel Nip for a recheck

## 2014-06-12 NOTE — Assessment & Plan Note (Signed)
Symptoms on prior visits likely secondary to anxiety. Appears to be doing very well on today's visit with no new symptoms

## 2014-06-12 NOTE — Assessment & Plan Note (Signed)
No recent arrhythmia. Currently not on a beta blocker

## 2014-06-13 ENCOUNTER — Telehealth: Payer: Self-pay | Admitting: *Deleted

## 2014-06-13 DIAGNOSIS — E871 Hypo-osmolality and hyponatremia: Secondary | ICD-10-CM

## 2014-06-13 NOTE — Telephone Encounter (Signed)
Pt is coming in tomorrow what labs and dx?  

## 2014-06-14 ENCOUNTER — Encounter: Payer: Self-pay | Admitting: Internal Medicine

## 2014-06-14 ENCOUNTER — Other Ambulatory Visit (INDEPENDENT_AMBULATORY_CARE_PROVIDER_SITE_OTHER): Payer: Commercial Managed Care - HMO

## 2014-06-14 DIAGNOSIS — E871 Hypo-osmolality and hyponatremia: Secondary | ICD-10-CM

## 2014-06-14 LAB — BASIC METABOLIC PANEL
BUN: 10 mg/dL (ref 6–23)
CO2: 26 mEq/L (ref 19–32)
CREATININE: 0.7 mg/dL (ref 0.4–1.2)
Calcium: 9.6 mg/dL (ref 8.4–10.5)
Chloride: 102 mEq/L (ref 96–112)
GFR: 91.43 mL/min (ref >60.00)
GLUCOSE: 90 mg/dL (ref 70–99)
Potassium: 4.7 mEq/L (ref 3.5–5.1)
SODIUM: 139 meq/L (ref 135–145)

## 2014-06-24 ENCOUNTER — Other Ambulatory Visit: Payer: Self-pay

## 2014-07-31 LAB — HM DEXA SCAN

## 2014-08-16 ENCOUNTER — Telehealth: Payer: Self-pay

## 2014-08-16 DIAGNOSIS — E871 Hypo-osmolality and hyponatremia: Secondary | ICD-10-CM

## 2014-08-16 NOTE — Telephone Encounter (Signed)
The patient called and stated she has been monitoring her blood pressure, and for several days, the systolic reading has been above 150.  She states the diastolic reading is normal, along with her heart rate.  She is hoping for some advice on what do.

## 2014-08-16 NOTE — Telephone Encounter (Signed)
Returned call to patent and found BP has been creeping up over the last month patient stated the Systolic has been between 150 and 938 , diastolic has been between 60 -70 with HR @ 59-73 patient stated HCTZ  Was D/C in October, patient also concerned about bone density done early November please advise if MD has seen if not will call [facility] for results. No noted swelling in extremities.[name]

## 2014-08-16 NOTE — Addendum Note (Signed)
Addended by: Crecencio Mc on: 08/16/2014 04:41 PM   Modules accepted: Orders

## 2014-08-16 NOTE — Telephone Encounter (Signed)
She can resume the hctz if she has it.  Have not seen the DEXA scan resutls

## 2014-08-16 NOTE — Telephone Encounter (Signed)
Do you want her to start with 12.5 since you D/C for low sodium?

## 2014-08-16 NOTE — Telephone Encounter (Signed)
She can

## 2014-08-16 NOTE — Telephone Encounter (Signed)
Yes. 12. 5 mg daily  And repeat BMET in 2 weeks

## 2014-08-17 NOTE — Telephone Encounter (Signed)
Patient notified and voiced understanding.

## 2014-08-30 ENCOUNTER — Telehealth: Payer: Self-pay | Admitting: Internal Medicine

## 2014-08-30 ENCOUNTER — Other Ambulatory Visit (INDEPENDENT_AMBULATORY_CARE_PROVIDER_SITE_OTHER): Payer: Commercial Managed Care - HMO

## 2014-08-30 DIAGNOSIS — E871 Hypo-osmolality and hyponatremia: Secondary | ICD-10-CM

## 2014-08-30 LAB — BASIC METABOLIC PANEL
BUN: 17 mg/dL (ref 6–23)
CHLORIDE: 95 meq/L — AB (ref 96–112)
CO2: 28 mEq/L (ref 19–32)
CREATININE: 0.6 mg/dL (ref 0.4–1.2)
Calcium: 9.4 mg/dL (ref 8.4–10.5)
GFR: 114.75 mL/min (ref >60.00)
Glucose, Bld: 61 mg/dL — ABNORMAL LOW (ref 70–99)
Potassium: 4.6 mEq/L (ref 3.5–5.1)
Sodium: 131 mEq/L — ABNORMAL LOW (ref 135–145)

## 2014-08-30 NOTE — Telephone Encounter (Signed)
Spoke to patient to notify her of dr. Lupita Dawn comments. Patient verbalized understanding and requested a copy. Left a copy up front for patient to pick up.

## 2014-08-30 NOTE — Telephone Encounter (Signed)
Bone Density scores received, she has osteopenia,  Very mild.,  T scores actually improved  since prior measurement . Continue calcium, vitamin d and weight bearing exercise on a regular basis.

## 2014-08-30 NOTE — Telephone Encounter (Signed)
No records of receiving the results,. [facility] and requested a report be faxed to the office. Spoke with Jackelyn Poling who stated that she will sent them right now. Report received and given to Dr. Derrel Nip for review.

## 2014-08-30 NOTE — Telephone Encounter (Signed)
Ms. Rausch came in today asking about her Bone Density results. She said she spoke to someone last month about them but still hasn't heard anything. I tried to look up her results in EPIC but didn't see anything. She had a DEXA at [facility] in Nov. This year. Please call the pt with her results. Pt ph# [phone] Thank you.

## 2014-08-31 ENCOUNTER — Other Ambulatory Visit: Payer: Commercial Managed Care - HMO

## 2014-09-01 ENCOUNTER — Encounter: Payer: Self-pay | Admitting: Internal Medicine

## 2014-09-07 ENCOUNTER — Encounter: Payer: Self-pay | Admitting: *Deleted

## 2014-09-15 ENCOUNTER — Telehealth: Payer: Self-pay | Admitting: Internal Medicine

## 2014-09-15 DIAGNOSIS — I10 Essential (primary) hypertension: Secondary | ICD-10-CM

## 2014-09-15 MED ORDER — AMLODIPINE BESYLATE 2.5 MG PO TABS: 2.5000 mg | ORAL_TABLET | Freq: Every day | ORAL | Status: DC

## 2014-09-15 NOTE — Telephone Encounter (Signed)
Patient came in for increased bp reading from dental office of 198/98. Patient was advised to come to Primary office to be seen. I checked BP 190/80 heart rate 80 conferred with Dr. Gilford Rile and received verbal to prescribe Amlodipine 2.5 mg daily no. 30 with 3 refills and for patient to be scheduled with NP On Monday to recheck BP. Patient stated she was comfortable with this plan and will return on Monday advised patient to call office if she starts to have any symptoms. Patient denied any this day no pain in chest or headache. Also advised patient to bring list of reading for NP to review on visit.FYI

## 2014-09-15 NOTE — Telephone Encounter (Signed)
Thanks

## 2014-09-19 ENCOUNTER — Encounter: Payer: Self-pay | Admitting: Nurse Practitioner

## 2014-09-19 ENCOUNTER — Ambulatory Visit (INDEPENDENT_AMBULATORY_CARE_PROVIDER_SITE_OTHER): Payer: Commercial Managed Care - HMO | Admitting: Nurse Practitioner

## 2014-09-19 VITALS — BP 158/82 | HR 78 | Temp 98.0°F | Resp 12 | Ht 63.0 in | Wt 122.8 lb

## 2014-09-19 DIAGNOSIS — I10 Essential (primary) hypertension: Secondary | ICD-10-CM

## 2014-09-19 NOTE — Progress Notes (Signed)
Subjective:    Patient ID: Wendy Love, female    DOB: 06/07/1941, 74 y.o.   MRN: [id number]  HPI Wendy Love is a 74 yo female with a CC of following up on an elevated BP on 09/15/14.   1) Pt was seen 09/15/14 with increased BP reading from dental office 198/98, checked here by LPN was 088/11 with pulse of 80. Dr. Gilford Rile prescribed Amlodipine 2.5 mg daily with 3 refills. Scheduled today with me. Pt describes on going issues with maintaining her BP. She reports it is lower in morning and elevates through out the day  Losartan 50 mg tablet twice daily Amlodipine 2.5 mg mid day   Latest BP readings since 09/15/14 are brought by pt and averages and ranges taken from readings are as follows: 5 days avg of 16 readings- 157/78  Range- 133/67 to 180/87 pulse range 65-78 Copy of all BP readings is available.   Review of Systems  Constitutional: Negative for fever, chills, diaphoresis, fatigue and unexpected weight change.  HENT: Negative for tinnitus.   Eyes: Negative for visual disturbance.  Respiratory: Negative for cough, chest tightness, shortness of breath and wheezing.   Cardiovascular: Negative for chest pain, palpitations and leg swelling.  Gastrointestinal: Negative for nausea, vomiting, abdominal pain, diarrhea, constipation and blood in stool.  Skin: Negative for color change and rash.  Neurological: Negative for dizziness, weakness, numbness and headaches.  Hematological: Does not bruise/bleed easily.  Psychiatric/Behavioral: The patient is not nervous/anxious.    Past Medical History  Diagnosis Date  . Hypertension   . Hyperthyroidism   . Depression   . Anxiety   . Rhinosinusitis   . Sciatica   . Bronchitis   . Endometriosis 1974    s/p abdominal surgery, 2nd surgery    History   Social History  . Marital Status: Married    Spouse Name: N/A    Number of Children: N/A  . Years of Education: N/A   Occupational History  . Not on file.   Social History Main  Topics  . Smoking status: Never Smoker   . Smokeless tobacco: Not on file  . Alcohol Use: No  . Drug Use: No  . Sexual Activity: Not on file   Other Topics Concern  . Not on file   Social History Narrative    Past Surgical History  Procedure Laterality Date  . Carpal tunnel release      bilateral  . Appendectomy    . Hemilaminectomy  April 2010    L3-L4hemi, L4-L5 microdiskectomy  . Endoscopy/colonoscopy  April 2012    Schatski's Ring , small hiatal hernia  . Abdominal hysterectomy  2000    Family History  Problem Relation Age of Onset  . Ovarian cancer Mother   . Cancer Mother 47    ovarian cancer, treated by [facility]  . Heart disease Father     Allergies  Allergen Reactions  . Brompheniramine-Pseudoeph     REACTION: agitation, nervous, sleepless  . Codeine     REACTION: GI Upset  . Diphenhydramine Hcl     REACTION: nervous, sleepless, agitated  . Loratadine     REACTION: nervous, sleeplessness    Current Outpatient Prescriptions on File Prior to Visit  Medication Sig Dispense Refill  . amLODipine (NORVASC) 2.5 MG tablet Take 1 tablet (2.5 mg total) by mouth daily. 30 tablet 3  . aspirin 81 MG tablet Take 81 mg by mouth daily.     [name] b complex vitamins tablet Take  1 tablet by mouth daily.      . Calcium-Magnesium 500-250 MG TABS Take by mouth 2 (two) times daily.      . Cholecalciferol (VITAMIN D3) 2000 UNITS TABS Take by mouth daily.      [name] co-enzyme Q-10 30 MG capsule Take 200 mg by mouth daily.     [name] levothyroxine (SYNTHROID, LEVOTHROID) 50 MCG tablet Take 1 tablet (50 mcg total) by mouth daily. 90 tablet 1  . losartan (COZAAR) 50 MG tablet TAKE 1 TABLET twice a day.    . Multiple Vitamin (MULTIVITAMIN) tablet Take 1 tablet by mouth daily.    . NON FORMULARY Tumeric 2000 mg take 1 daily     . PROBIOTIC CAPS Take by mouth daily.      . Triamcinolone Acetonide (NASACORT ALLERGY 24HR NA) Place 1 spray into the nose daily.    . vitamin C (ASCORBIC ACID) 500 MG  tablet Take 500 mg by mouth daily.    . Zinc 25 MG TABS Take 1 tablet by mouth daily.     No current facility-administered medications on file prior to visit.      Objective:   Physical Exam  Constitutional: She is oriented to person, place, and time. She appears well-developed and well-nourished. No distress.  HENT:  Head: Normocephalic and atraumatic.  Right Ear: External ear normal.  Left Ear: External ear normal.  Neck: Neck supple.  Cardiovascular: Normal rate, regular rhythm, normal heart sounds and intact distal pulses.  Exam reveals no gallop and no friction rub.   No murmur heard. Pulmonary/Chest: Effort normal and breath sounds normal. No respiratory distress. She has no wheezes. She has no rales. She exhibits no tenderness.  Neurological: She is alert and oriented to person, place, and time. No cranial nerve deficit. She exhibits normal muscle tone. Coordination normal.  Skin: Skin is warm and dry. No rash noted. She is not diaphoretic.  Psychiatric: She has a normal mood and affect. Her behavior is normal. Judgment and thought content normal.   BP 158/82 mmHg  Pulse 78  Temp(Src) 98 F (36.7 C) (Oral)  Resp 12  Ht 5\' 3"  (1.6 m)  Wt 122 lb 12.8 oz (55.702 kg)  BMI 21.76 kg/m2  SpO2 99%     Assessment & Plan:

## 2014-09-19 NOTE — Progress Notes (Signed)
Pre visit review using our clinic review tool, if applicable. No additional management support is needed unless otherwise documented below in the visit note. 

## 2014-09-19 NOTE — Patient Instructions (Signed)
Please contact me via MyChart next week Wendy Love or Fri) and let me know how your BP are going.  Take the Amlodipine with the Losartan in the morning. Then take the losartan 2nd dose around supper as you are currently doing.   Report any low BP.   Follow up with Dr. Derrel Nip 10/14/14

## 2014-09-20 NOTE — Assessment & Plan Note (Signed)
Uncontrolled. Pt goal for >75 years old is <150/90. Pt avg over 16 readings since adding Amlodipine 2.5 mg mid day is 157/78. I have advised her to take her Amlodipine in am with the first dose of Losartan 50 mg because she is lower in the am and amlodipine has a long half life and peak 6-12 hours. She will take the 2nd dose of losartan 50 mg at dinner time as she has been. Pt instructed to contact me via MyChart with further readings after this change. She is to report any low BP. Continue to FU with Dr. Derrel Nip in Feb.

## 2014-09-26 ENCOUNTER — Ambulatory Visit: Payer: Commercial Managed Care - HMO | Admitting: Nurse Practitioner

## 2014-10-14 ENCOUNTER — Encounter: Payer: Self-pay | Admitting: Internal Medicine

## 2014-10-14 ENCOUNTER — Ambulatory Visit (INDEPENDENT_AMBULATORY_CARE_PROVIDER_SITE_OTHER): Payer: Commercial Managed Care - HMO | Admitting: Internal Medicine

## 2014-10-14 VITALS — BP 140/78 | HR 70 | Temp 98.4°F | Resp 16 | Ht 63.0 in | Wt 123.2 lb

## 2014-10-14 DIAGNOSIS — E871 Hypo-osmolality and hyponatremia: Secondary | ICD-10-CM

## 2014-10-14 DIAGNOSIS — E038 Other specified hypothyroidism: Secondary | ICD-10-CM

## 2014-10-14 DIAGNOSIS — I10 Essential (primary) hypertension: Secondary | ICD-10-CM

## 2014-10-14 DIAGNOSIS — E034 Atrophy of thyroid (acquired): Secondary | ICD-10-CM

## 2014-10-14 MED ORDER — TEMAZEPAM 15 MG PO CAPS: 15.0000 mg | ORAL_CAPSULE | Freq: Every evening | ORAL | Status: DC | PRN

## 2014-10-14 MED ORDER — LOSARTAN POTASSIUM 50 MG PO TABS: 50.0000 mg | ORAL_TABLET | Freq: Two times a day (BID) | ORAL | Status: DC

## 2014-10-14 NOTE — Progress Notes (Signed)
Pre-visit discussion using our clinic review tool. No additional management support is needed unless otherwise documented below in the visit note.  

## 2014-10-14 NOTE — Patient Instructions (Addendum)
Your blood pressure is much better  Continue taking the losartan twice daily and the amlodipine in the morning.  If your blood pressure becomes elevated again,  You can take an additional dose of amlodipine (the maximum daily dose is 10 mg)   I'll see you in 6 moths

## 2014-10-14 NOTE — Progress Notes (Signed)
Patient ID: Wendy Love, female   DOB: September 21, 1940, 74 y.o.   MRN: [id number] Patient Active Problem List   Diagnosis Date Noted  . Hyponatremia 06/01/2014  . Cerumen impaction 05/30/2014  . Fasting hypoglycemia 05/30/2014  . Bilateral impacted cerumen 11/16/2013  . Onychomycosis of toenail 11/16/2013  . Tinnitus 11/16/2013  . Injury of right thumb 02/25/2013  . Medicare annual wellness visit, subsequent 11/30/2012  . Postmenopausal atrophic vaginitis 11/21/2012  . Atrial tachycardia, paroxysmal 11/13/2010  . OTHER SYMPTOMS INVOLVING DIGESTIVE SYSTEM OTHER 09/14/2010  . DYSLIPIDEMIA 03/21/2010  . UNEQUAL LEG LENGTH 02/07/2010  . INSOMNIA, CHRONIC 12/19/2009  . TREMOR 12/19/2009  . SCIATICA, RIGHT 09/27/2008  . Hypothyroidism 10/30/2007  . Anxiety 10/30/2007  . SYMPTOMATIC MENOPAUSAL/FEMALE CLIMACTERIC STATES 10/30/2007  . DEPRESSION 07/30/2007  . CARPAL TUNNEL SYNDROME 07/30/2007  . Essential hypertension 07/30/2007  . DEGENERATIVE JOINT DISEASE, CERVICAL SPINE 07/30/2007    Subjective:  CC:   Chief Complaint  Patient presents with  . Follow-up  . Hypertension    HPI:   Wendy Love is a 74 y.o. female who presents for FOLLOW UP on  Hypertension and  hypothyroidism .  She was seen recently by Dr Gilford Rile for persistent elevation in systolic blood pressures confirmed with readings from her dentist's office and home readings.  She was started on amlodipine 2.5 mg and ahs been checking her bp two to three times daily.  She is tolerating the medication and Her home bp has improved with addition of amlodipine 2. 5 mg in am .  She continues to take losartan 100 mg in divided doses because without the split dose her afternoon pressures are elevated. She has history of hyponatremia with hctz. .    Past Medical History  Diagnosis Date  . Hypertension   . Hyperthyroidism   . Depression   . Anxiety   . Rhinosinusitis   . Sciatica   . Bronchitis   . Endometriosis  1974    s/p abdominal surgery, 2nd surgery    Past Surgical History  Procedure Laterality Date  . Carpal tunnel release      bilateral  . Appendectomy    . Hemilaminectomy  April 2010    L3-L4hemi, L4-L5 microdiskectomy  . Endoscopy/colonoscopy  April 2012    Schatski's Ring , small hiatal hernia  . Abdominal hysterectomy  2000       The following portions of the patient's history were reviewed and updated as appropriate: Allergies, current medications, and problem list.    Review of Systems:   Patient denies headache, fevers, malaise, unintentional weight loss, skin rash, eye pain, sinus congestion and sinus pain, sore throat, dysphagia,  hemoptysis , cough, dyspnea, wheezing, chest pain, palpitations, orthopnea, edema, abdominal pain, nausea, melena, diarrhea, constipation, flank pain, dysuria, hematuria, urinary  Frequency, nocturia, numbness, tingling, seizures,  Focal weakness, Loss of consciousness,  Tremor, insomnia, depression, anxiety, and suicidal ideation.     History   Social History  . Marital Status: Married    Spouse Name: N/A    Number of Children: N/A  . Years of Education: N/A   Occupational History  . Not on file.   Social History Main Topics  . Smoking status: Never Smoker   . Smokeless tobacco: Not on file  . Alcohol Use: No  . Drug Use: No  . Sexual Activity: Not on file   Other Topics Concern  . Not on file   Social History Narrative    Objective:  Filed Vitals:  10/14/14 1602  BP: 140/78  Pulse: 70  Temp: 98.4 F (36.9 C)  Resp: 16     General appearance: alert, cooperative and appears stated age Ears: normal TM's and external ear canals both ears Throat: lips, mucosa, and tongue normal; teeth and gums normal Neck: no adenopathy, no carotid bruit, supple, symmetrical, trachea midline and thyroid not enlarged, symmetric, no tenderness/mass/nodules Back: symmetric, no curvature. ROM normal. No CVA tenderness. Lungs: clear to  auscultation bilaterally Heart: regular rate and rhythm, S1, S2 normal, no murmur, click, rub or gallop Abdomen: soft, non-tender; bowel sounds normal; no masses,  no organomegaly Pulses: 2+ and symmetric Skin: Skin color, texture, turgor normal. No rashes or lesions Lymph nodes: Cervical, supraclavicular, and axillary nodes normal.  Assessment and Plan:  Essential hypertension Improving control on current regimen. Renal function stable, no changes today.  Lab Results  Component Value Date   CREATININE 0.6 08/30/2014   Lab Results  Component Value Date   NA 131* 08/30/2014   K 4.6 08/30/2014   CL 95* 08/30/2014   CO2 28 08/30/2014      Hypothyroidism Thyroid function is WNL on current dose.  No current changes needed.   Lab Results  Component Value Date   TSH 2.371 10/14/2014      Hyponatremia She has hyponatremia without use of HCTZ.       Updated Medication List Outpatient Encounter Prescriptions as of 10/14/2014  Medication Sig  . amLODipine (NORVASC) 2.5 MG tablet Take 1 tablet (2.5 mg total) by mouth daily.  [name] aspirin 81 MG tablet Take 81 mg by mouth daily.   [name] b complex vitamins tablet Take 1 tablet by mouth daily.    . Calcium-Magnesium 500-250 MG TABS Take by mouth 2 (two) times daily.    . Cholecalciferol (VITAMIN D3) 2000 UNITS TABS Take by mouth daily.    [name] co-enzyme Q-10 30 MG capsule Take 200 mg by mouth daily.   [name] levothyroxine (SYNTHROID, LEVOTHROID) 50 MCG tablet Take 1 tablet (50 mcg total) by mouth daily.  [name] losartan (COZAAR) 50 MG tablet Take 1 tablet (50 mg total) by mouth 2 (two) times daily.  . Multiple Vitamin (MULTIVITAMIN) tablet Take 1 tablet by mouth daily.  . NON FORMULARY Tumeric 2000 mg take 1 daily   . PROBIOTIC CAPS Take by mouth daily.    . temazepam (RESTORIL) 15 MG capsule Take 1 capsule (15 mg total) by mouth at bedtime as needed for sleep.  . Triamcinolone Acetonide (NASACORT ALLERGY 24HR NA) Place 1 spray into the nose  daily.  . vitamin C (ASCORBIC ACID) 500 MG tablet Take 500 mg by mouth daily.  . Zinc 25 MG TABS Take 1 tablet by mouth daily.  . [DISCONTINUED] losartan (COZAAR) 50 MG tablet TAKE 1 TABLET twice a day.  . [DISCONTINUED] temazepam (RESTORIL) 15 MG capsule Take 15 mg by mouth at bedtime as needed for sleep.     Orders Placed This Encounter  Procedures  . TSH    No Follow-up on file.

## 2014-10-15 LAB — TSH: TSH: 2.371 u[IU]/mL (ref 0.350–4.500)

## 2014-10-16 ENCOUNTER — Encounter: Payer: Self-pay | Admitting: Internal Medicine

## 2014-10-16 NOTE — Assessment & Plan Note (Signed)
She has hyponatremia without use of HCTZ.

## 2014-10-16 NOTE — Assessment & Plan Note (Signed)
Improving control on current regimen. Renal function stable, no changes today.  Lab Results  Component Value Date   CREATININE 0.6 08/30/2014   Lab Results  Component Value Date   NA 131* 08/30/2014   K 4.6 08/30/2014   CL 95* 08/30/2014   CO2 28 08/30/2014

## 2014-10-16 NOTE — Assessment & Plan Note (Signed)
Thyroid function is WNL on current dose.  No current changes needed.   Lab Results  Component Value Date   TSH 2.371 10/14/2014

## 2014-10-17 ENCOUNTER — Other Ambulatory Visit: Payer: Self-pay | Admitting: Internal Medicine

## 2014-10-17 ENCOUNTER — Encounter: Payer: Self-pay | Admitting: Internal Medicine

## 2014-10-17 DIAGNOSIS — E034 Atrophy of thyroid (acquired): Secondary | ICD-10-CM

## 2014-10-17 MED ORDER — LEVOTHYROXINE SODIUM 50 MCG PO TABS: 50.0000 ug | ORAL_TABLET | Freq: Every day | ORAL | Status: DC

## 2014-10-18 MED ORDER — LEVOTHYROXINE SODIUM 50 MCG PO TABS: 50.0000 ug | ORAL_TABLET | Freq: Every day | ORAL | Status: DC

## 2014-11-16 ENCOUNTER — Other Ambulatory Visit: Payer: Self-pay

## 2014-11-16 DIAGNOSIS — Z1231 Encounter for screening mammogram for malignant neoplasm of breast: Secondary | ICD-10-CM

## 2014-12-29 ENCOUNTER — Encounter: Payer: Self-pay | Admitting: Internal Medicine

## 2014-12-29 ENCOUNTER — Ambulatory Visit
Admission: RE | Admit: 2014-12-29 | Discharge: 2014-12-29 | Disposition: A | Discharge status: Home / Self Care | Payer: Commercial Managed Care - HMO | Source: Ambulatory Visit

## 2014-12-29 DIAGNOSIS — Z1231 Encounter for screening mammogram for malignant neoplasm of breast: Secondary | ICD-10-CM

## 2015-01-20 ENCOUNTER — Other Ambulatory Visit: Payer: Self-pay | Admitting: Internal Medicine

## 2015-02-03 ENCOUNTER — Other Ambulatory Visit: Payer: Self-pay | Admitting: Internal Medicine

## 2015-02-03 DIAGNOSIS — I10 Essential (primary) hypertension: Secondary | ICD-10-CM

## 2015-02-03 MED ORDER — AMLODIPINE BESYLATE 2.5 MG PO TABS: 2.5000 mg | ORAL_TABLET | Freq: Every day | ORAL | Status: DC

## 2015-02-03 NOTE — Telephone Encounter (Signed)
Refill sent as requested. 

## 2015-02-13 ENCOUNTER — Telehealth: Payer: Self-pay

## 2015-02-13 ENCOUNTER — Other Ambulatory Visit: Payer: Self-pay | Admitting: *Deleted

## 2015-02-13 DIAGNOSIS — I10 Essential (primary) hypertension: Secondary | ICD-10-CM

## 2015-02-13 MED ORDER — AMLODIPINE BESYLATE 2.5 MG PO TABS: 2.5000 mg | ORAL_TABLET | Freq: Every day | ORAL | Status: DC

## 2015-02-13 NOTE — Telephone Encounter (Signed)
The patient called and stated she has not recieved her norvasc medication.  ptcallback - [phone]

## 2015-02-14 MED ORDER — AMLODIPINE BESYLATE 2.5 MG PO TABS: 2.5000 mg | ORAL_TABLET | Freq: Every day | ORAL | Status: DC

## 2015-02-14 NOTE — Addendum Note (Signed)
Addended by: Cheyenne Adas A on: 02/14/2015 04:12 PM   Modules accepted: Orders

## 2015-02-14 NOTE — Telephone Encounter (Signed)
LVM for pt to return call

## 2015-02-14 NOTE — Telephone Encounter (Signed)
15 tabs sent to pharmacy to last until she gets Rx from mail order

## 2015-03-06 ENCOUNTER — Other Ambulatory Visit: Payer: Self-pay

## 2015-03-31 ENCOUNTER — Telehealth: Payer: Self-pay | Admitting: *Deleted

## 2015-03-31 NOTE — Telephone Encounter (Signed)
Forward to [place] 

## 2015-03-31 NOTE — Telephone Encounter (Signed)
Pt calling asking if dr Rockey Situ would please recommend a surgeon, Pt has a good family friend who is getting a bypass on heart and would like it to be at [facility], so this is why they are asking if he can  Give some names or anyone he likes to help their friend out.  Please call when we do have some.

## 2015-04-04 NOTE — Telephone Encounter (Signed)
Any of the surgeons at [facility] would be just fine All are excellent. Hard to pick just 1

## 2015-06-01 ENCOUNTER — Encounter: Payer: Self-pay | Admitting: Internal Medicine

## 2015-06-01 ENCOUNTER — Ambulatory Visit (INDEPENDENT_AMBULATORY_CARE_PROVIDER_SITE_OTHER): Payer: Commercial Managed Care - HMO | Admitting: Internal Medicine

## 2015-06-01 VITALS — BP 130/84 | HR 73 | Temp 98.2°F | Resp 12 | Ht 63.0 in | Wt 122.5 lb

## 2015-06-01 DIAGNOSIS — Z0000 Encounter for general adult medical examination without abnormal findings: Secondary | ICD-10-CM

## 2015-06-01 NOTE — Progress Notes (Signed)
Pre-visit discussion using our clinic review tool. No additional management support is needed unless otherwise documented below in the visit note.  

## 2015-06-01 NOTE — Patient Instructions (Signed)
I am happy to see Wendy Love for primary care,  Just tell the front desk   Set up a fasting lab appointment   Your bone density is fine!    Health Maintenance Adopting a healthy lifestyle and getting preventive care can go a long way to promote health and wellness. Talk with your health care provider about what schedule of regular examinations is right for you. This is a good chance for you to check in with your provider about disease prevention and staying healthy. In between checkups, there are plenty of things you can do on your own. Experts have done a lot of research about which lifestyle changes and preventive measures are most likely to keep you healthy. Ask your health care provider for more information. WEIGHT AND DIET  Eat a healthy diet  Be sure to include plenty of vegetables, fruits, low-fat dairy products, and lean protein.  Do not eat a lot of foods high in solid fats, added sugars, or salt.  Get regular exercise. This is one of the most important things you can do for your health.  Most adults should exercise for at least 150 minutes each week. The exercise should increase your heart rate and make you sweat (moderate-intensity exercise).  Most adults should also do strengthening exercises at least twice a week. This is in addition to the moderate-intensity exercise.  Maintain a healthy weight  Body mass index (BMI) is a measurement that can be used to identify possible weight problems. It estimates body fat based on height and weight. Your health care provider can help determine your BMI and help you achieve or maintain a healthy weight.  For females 75 years of age and older:   A BMI below 18.5 is considered underweight.  A BMI of 18.5 to 24.9 is normal.  A BMI of 25 to 29.9 is considered overweight.  A BMI of 30 and above is considered obese.  Watch levels of cholesterol and blood lipids  You should start having your blood tested for lipids and cholesterol at 75 years  of age, then have this test every 5 years.  You may need to have your cholesterol levels checked more often if:  Your lipid or cholesterol levels are high.  You are older than 75 years of age.  You are at high risk for heart disease.  CANCER SCREENING   Lung Cancer  Lung cancer screening is recommended for adults 75-54 years old who are at high risk for lung cancer because of a history of smoking.  A yearly low-dose CT scan of the lungs is recommended for people who:  Currently smoke.  Have quit within the past 15 years.  Have at least a 30-pack-year history of smoking. A pack year is smoking an average of one pack of cigarettes a day for 1 year.  Yearly screening should continue until it has been 15 years since you quit.  Yearly screening should stop if you develop a health problem that would prevent you from having lung cancer treatment.  Breast Cancer  Practice breast self-awareness. This means understanding how your breasts normally appear and feel.  It also means doing regular breast self-exams. Let your health care provider know about any changes, no matter how small.  If you are in your 20s or 30s, you should have a clinical breast exam (CBE) by a health care provider every 1-3 years as part of a regular health exam.  If you are 75 or older, have a CBE every  year. Also consider having a breast X-ray (mammogram) every year.  If you have a family history of breast cancer, talk to your health care provider about genetic screening.  If you are at high risk for breast cancer, talk to your health care provider about having an MRI and a mammogram every year.  Breast cancer gene (BRCA) assessment is recommended for women who have family members with BRCA-related cancers. BRCA-related cancers include:  Breast.  Ovarian.  Tubal.  Peritoneal cancers.  Results of the assessment will determine the need for genetic counseling and BRCA1 and BRCA2 testing. Cervical  Cancer Routine pelvic examinations to screen for cervical cancer are no longer recommended for nonpregnant women who are considered low risk for cancer of the pelvic organs (ovaries, uterus, and vagina) and who do not have symptoms. A pelvic examination may be necessary if you have symptoms including those associated with pelvic infections. Ask your health care provider if a screening pelvic exam is right for you.   The Pap test is the screening test for cervical cancer for women who are considered at risk.  If you had a hysterectomy for a problem that was not cancer or a condition that could lead to cancer, then you no longer need Pap tests.  If you are older than 75 years, and you have had normal Pap tests for the past 10 years, you no longer need to have Pap tests.  If you have had past treatment for cervical cancer or a condition that could lead to cancer, you need Pap tests and screening for cancer for at least 20 years after your treatment.  If you no longer get a Pap test, assess your risk factors if they change (such as having a new sexual partner). This can affect whether you should start being screened again.  Some women have medical problems that increase their chance of getting cervical cancer. If this is the case for you, your health care provider may recommend more frequent screening and Pap tests.  The human papillomavirus (HPV) test is another test that may be used for cervical cancer screening. The HPV test looks for the virus that can cause cell changes in the cervix. The cells collected during the Pap test can be tested for HPV.  The HPV test can be used to screen women 75 years of age and older. Getting tested for HPV can extend the interval between normal Pap tests from three to five years.  An HPV test also should be used to screen women of any age who have unclear Pap test results.  After 75 years of age, women should have HPV testing as often as Pap tests.  Colorectal  Cancer  This type of cancer can be detected and often prevented.  Routine colorectal cancer screening usually begins at 75 years of age and continues through 75 years of age.  Your health care provider may recommend screening at an earlier age if you have risk factors for colon cancer.  Your health care provider may also recommend using home test kits to check for hidden blood in the stool.  A small camera at the end of a tube can be used to examine your colon directly (sigmoidoscopy or colonoscopy). This is done to check for the earliest forms of colorectal cancer.  Routine screening usually begins at age 24.  Direct examination of the colon should be repeated every 5-10 years through 74 years of age. However, you may need to be screened more often if early  forms of precancerous polyps or small growths are found. Skin Cancer  Check your skin from head to toe regularly.  Tell your health care provider about any new moles or changes in moles, especially if there is a change in a mole's shape or color.  Also tell your health care provider if you have a mole that is larger than the size of a pencil eraser.  Always use sunscreen. Apply sunscreen liberally and repeatedly throughout the day.  Protect yourself by wearing long sleeves, pants, a wide-brimmed hat, and sunglasses whenever you are outside. HEART DISEASE, DIABETES, AND HIGH BLOOD PRESSURE   Have your blood pressure checked at least every 1-2 years. High blood pressure causes heart disease and increases the risk of stroke.  If you are between 71 years and 81 years old, ask your health care provider if you should take aspirin to prevent strokes.  Have regular diabetes screenings. This involves taking a blood sample to check your fasting blood sugar level.  If you are at a normal weight and have a low risk for diabetes, have this test once every three years after 75 years of age.  If you are overweight and have a high risk for  diabetes, consider being tested at a younger age or more often. PREVENTING INFECTION  Hepatitis B  If you have a higher risk for hepatitis B, you should be screened for this virus. You are considered at high risk for hepatitis B if:  You were born in a country where hepatitis B is common. Ask your health care provider which countries are considered high risk.  Your parents were born in a high-risk country, and you have not been immunized against hepatitis B (hepatitis B vaccine).  You have HIV or AIDS.  You use needles to inject street drugs.  You live with someone who has hepatitis B.  You have had sex with someone who has hepatitis B.  You get hemodialysis treatment.  You take certain medicines for conditions, including cancer, organ transplantation, and autoimmune conditions. Hepatitis C  Blood testing is recommended for:  Everyone born from [birth year] through 1966.  Anyone with known risk factors for hepatitis C. Sexually transmitted infections (STIs)  You should be screened for sexually transmitted infections (STIs) including gonorrhea and chlamydia if:  You are sexually active and are younger than 75 years of age.  You are older than 75 years of age and your health care provider tells you that you are at risk for this type of infection.  Your sexual activity has changed since you were last screened and you are at an increased risk for chlamydia or gonorrhea. Ask your health care provider if you are at risk.  If you do not have HIV, but are at risk, it may be recommended that you take a prescription medicine daily to prevent HIV infection. This is called pre-exposure prophylaxis (PrEP). You are considered at risk if:  You are sexually active and do not regularly use condoms or know the HIV status of your partner(s).  You take drugs by injection.  You are sexually active with a partner who has HIV. Talk with your health care provider about whether you are at high risk of  being infected with HIV. If you choose to begin PrEP, you should first be tested for HIV. You should then be tested every 3 months for as long as you are taking PrEP.  PREGNANCY   If you are premenopausal and you may become pregnant, ask your  health care provider about preconception counseling.  If you may become pregnant, take 400 to 800 micrograms (mcg) of folic acid every day.  If you want to prevent pregnancy, talk to your health care provider about birth control (contraception). OSTEOPOROSIS AND MENOPAUSE   Osteoporosis is a disease in which the bones lose minerals and strength with aging. This can result in serious bone fractures. Your risk for osteoporosis can be identified using a bone density scan.  If you are 52 years of age or older, or if you are at risk for osteoporosis and fractures, ask your health care provider if you should be screened.  Ask your health care provider whether you should take a calcium or vitamin D supplement to lower your risk for osteoporosis.  Menopause may have certain physical symptoms and risks.  Hormone replacement therapy may reduce some of these symptoms and risks. Talk to your health care provider about whether hormone replacement therapy is right for you.  HOME CARE INSTRUCTIONS   Schedule regular health, dental, and eye exams.  Stay current with your immunizations.   Do not use any tobacco products including cigarettes, chewing tobacco, or electronic cigarettes.  If you are pregnant, do not drink alcohol.  If you are breastfeeding, limit how much and how often you drink alcohol.  Limit alcohol intake to no more than 1 drink per day for nonpregnant women. One drink equals 12 ounces of beer, 5 ounces of wine, or 1 ounces of hard liquor.  Do not use street drugs.  Do not share needles.  Ask your health care provider for help if you need support or information about quitting drugs.  Tell your health care provider if you often feel  depressed.  Tell your health care provider if you have ever been abused or do not feel safe at home. Document Released: 03/11/2011 Document Revised: 01/10/2014 Document Reviewed: 07/28/2013 [facility] Patient Information 2015 [place], [place]. This information is not intended to replace advice given to you by your health care provider. Make sure you discuss any questions you have with your health care provider.

## 2015-06-03 NOTE — Progress Notes (Signed)
Patient ID: Wendy Love, female    DOB: 20-Sep-1940  Age: 74 y.o. MRN: [id number]  The patient is here for annual Medicare wellness examination and management of other chronic and acute problems.   The risk factors are reflected in the social history.  The roster of all physicians providing medical care to patient - is listed in the Snapshot section of the chart.  Activities of daily living:  The patient is 100% independent in all ADLs: dressing, toileting, feeding as well as independent mobility  Home safety : The patient has smoke detectors in the home. They wear seatbelts.  There are no firearms at home. There is no violence in the home.   There is no risks for hepatitis, STDs or HIV. There is no   history of blood transfusion. They have no travel history to infectious disease endemic areas of the world.  The patient has seen their dentist in the last six month. They have seen their eye doctor in the last year. They admit to slight hearing difficulty with regard to whispered voices and some television programs.  They have deferred audiologic testing in the last year.  They do not  have excessive sun exposure. Discussed the need for sun protection: hats, long sleeves and use of sunscreen if there is significant sun exposure.   Diet: the importance of a healthy diet is discussed. They do have a healthy diet.  The benefits of regular aerobic exercise were discussed. She walks 4 times per week ,  20 minutes.   Depression screen: there are no signs or vegative symptoms of depression- irritability, change in appetite, anhedonia, sadness/tearfullness.  Cognitive assessment: the patient manages all their financial and personal affairs and is actively engaged. They could relate day,date,year and events; recalled 2/3 objects at 3 minutes; performed clock-face test normally.  The following portions of the patient's history were reviewed and updated as appropriate: allergies, current  medications, past family history, past medical history,  past surgical history, past social history  and problem list.  Visual acuity was not assessed per patient preference since she has regular follow up with her ophthalmologist. Hearing and body mass index were assessed and reviewed.   During the course of the visit the patient was educated and counseled about appropriate screening and preventive services including : fall prevention , diabetes screening, nutrition counseling, colorectal cancer screening, and recommended immunizations.    CC: There were no encounter diagnoses.  History Wendy Love has a past medical history of Hypertension; Hyperthyroidism; Depression; Anxiety; Rhinosinusitis; Sciatica; Bronchitis; and Endometriosis (1974).   She has past surgical history that includes Carpal tunnel release; Appendectomy; hemilaminectomy (April 2010); Endoscopy/colonoscopy (April 2012); and Abdominal hysterectomy (2000).   Her family history includes Cancer (age of onset: 10) in her mother; Heart disease in her father; Ovarian cancer in her mother.She reports that she has never smoked. She does not have any smokeless tobacco history on file. She reports that she does not drink alcohol or use illicit drugs.  Outpatient Prescriptions Prior to Visit  Medication Sig Dispense Refill  . amLODipine (NORVASC) 2.5 MG tablet Take 1 tablet (2.5 mg total) by mouth daily. 15 tablet 0  . b complex vitamins tablet Take 1 tablet by mouth daily.      . Calcium-Magnesium 500-250 MG TABS Take by mouth 2 (two) times daily.      . Cholecalciferol (VITAMIN D3) 2000 UNITS TABS Take 5,000 Units by mouth daily.     [name] levothyroxine (SYNTHROID, LEVOTHROID) 50 MCG  tablet TAKE 1 TABLET EVERY DAY 90 tablet 1  . losartan (COZAAR) 50 MG tablet TAKE 2 TABLETS EVERY DAY 180 tablet 1  . Multiple Vitamin (MULTIVITAMIN) tablet Take 1 tablet by mouth daily.    [name] PROBIOTIC CAPS Take by mouth daily.      . temazepam (RESTORIL) 15  MG capsule Take 1 capsule (15 mg total) by mouth at bedtime as needed for sleep. 90 capsule 3  . Triamcinolone Acetonide (NASACORT ALLERGY 24HR NA) Place 1 spray into the nose daily.    . vitamin C (ASCORBIC ACID) 500 MG tablet Take 500 mg by mouth daily.    [name] aspirin 81 MG tablet Take 81 mg by mouth daily.     [name] co-enzyme Q-10 30 MG capsule Take 200 mg by mouth daily.     . NON FORMULARY Tumeric 2000 mg take 1 daily     . Zinc 25 MG TABS Take 1 tablet by mouth daily.     No facility-administered medications prior to visit.    Review of Systems   Patient denies headache, fevers, malaise, unintentional weight loss, skin rash, eye pain, sinus congestion and sinus pain, sore throat, dysphagia,  hemoptysis , cough, dyspnea, wheezing, chest pain, palpitations, orthopnea, edema, abdominal pain, nausea, melena, diarrhea, constipation, flank pain, dysuria, hematuria, urinary  Frequency, nocturia, numbness, tingling, seizures,  Focal weakness, Loss of consciousness,  Tremor, insomnia, depression, anxiety, and suicidal ideation.      Objective:  BP 130/84 mmHg  Pulse 73  Temp(Src) 98.2 F (36.8 C) (Oral)  Resp 12  Ht 5\' 3"  (1.6 m)  Wt 122 lb 8 oz (55.566 kg)  BMI 21.71 kg/m2  SpO2 100%  Physical Exam   General appearance: alert, cooperative and appears stated age Head: Normocephalic, without obvious abnormality, atraumatic Eyes: conjunctivae/corneas clear. PERRL, EOM's intact. Fundi benign. Ears: normal TM's and external ear canals both ears Nose: Nares normal. Septum midline. Mucosa normal. No drainage or sinus tenderness. Throat: lips, mucosa, and tongue normal; teeth and gums normal Neck: no adenopathy, no carotid bruit, no JVD, supple, symmetrical, trachea midline and thyroid not enlarged, symmetric, no tenderness/mass/nodules Lungs: clear to auscultation bilaterally Breasts: normal appearance, no masses or tenderness Heart: regular rate and rhythm, S1, S2 normal, no murmur, click,  rub or gallop Abdomen: soft, non-tender; bowel sounds normal; no masses,  no organomegaly Extremities: extremities normal, atraumatic, no cyanosis or edema Pulses: 2+ and symmetric Skin: Skin color, texture, turgor normal. No rashes or lesions Neurologic: Alert and oriented X 3, normal strength and tone. Normal symmetric reflexes. Normal coordination and gait.     Assessment & Plan:   Problem List Items Addressed This Visit    None      I am having Wendy Love maintain her aspirin, co-enzyme Q-10, b complex vitamins, Vitamin D3, Calcium-Magnesium, Probiotic, NON FORMULARY, multivitamin, Triamcinolone Acetonide (NASACORT ALLERGY 24HR NA), vitamin C, Zinc, temazepam, levothyroxine, losartan, amLODipine, and Selenium.  Meds ordered this encounter  Medications  . Selenium 100 MCG CAPS    Sig: Take 1 capsule by mouth daily.    There are no discontinued medications.  Follow-up: No Follow-up on file.   Crecencio Mc, MD

## 2015-06-03 NOTE — Assessment & Plan Note (Signed)

## 2015-06-08 ENCOUNTER — Telehealth: Payer: Self-pay | Admitting: *Deleted

## 2015-06-08 ENCOUNTER — Other Ambulatory Visit: Payer: Self-pay | Admitting: Internal Medicine

## 2015-06-08 DIAGNOSIS — E034 Atrophy of thyroid (acquired): Secondary | ICD-10-CM

## 2015-06-08 DIAGNOSIS — I10 Essential (primary) hypertension: Secondary | ICD-10-CM

## 2015-06-08 DIAGNOSIS — M2170 Unequal limb length (acquired), unspecified site: Secondary | ICD-10-CM

## 2015-06-08 DIAGNOSIS — E161 Other hypoglycemia: Secondary | ICD-10-CM

## 2015-06-08 DIAGNOSIS — E785 Hyperlipidemia, unspecified: Secondary | ICD-10-CM

## 2015-06-08 NOTE — Telephone Encounter (Signed)
Patient has requested a call back. Patient stated that she was to have a lab draw, however she wasn't sure what Dr. Derrel Nip wanted to have drawn. She has no future appt for labs.

## 2015-06-08 NOTE — Telephone Encounter (Signed)
Patient lab scheduled labs in system.

## 2015-06-16 ENCOUNTER — Other Ambulatory Visit: Payer: Commercial Managed Care - HMO

## 2015-06-27 ENCOUNTER — Ambulatory Visit: Payer: Commercial Managed Care - HMO

## 2015-06-27 ENCOUNTER — Other Ambulatory Visit: Payer: Commercial Managed Care - HMO

## 2015-07-04 ENCOUNTER — Ambulatory Visit (INDEPENDENT_AMBULATORY_CARE_PROVIDER_SITE_OTHER): Payer: Commercial Managed Care - HMO

## 2015-07-04 ENCOUNTER — Other Ambulatory Visit (INDEPENDENT_AMBULATORY_CARE_PROVIDER_SITE_OTHER): Payer: Commercial Managed Care - HMO

## 2015-07-04 DIAGNOSIS — E161 Other hypoglycemia: Secondary | ICD-10-CM

## 2015-07-04 DIAGNOSIS — E038 Other specified hypothyroidism: Secondary | ICD-10-CM | POA: Diagnosis not present

## 2015-07-04 DIAGNOSIS — Z23 Encounter for immunization: Secondary | ICD-10-CM | POA: Diagnosis not present

## 2015-07-04 DIAGNOSIS — E785 Hyperlipidemia, unspecified: Secondary | ICD-10-CM | POA: Diagnosis not present

## 2015-07-04 DIAGNOSIS — E034 Atrophy of thyroid (acquired): Secondary | ICD-10-CM | POA: Diagnosis not present

## 2015-07-04 DIAGNOSIS — I10 Essential (primary) hypertension: Secondary | ICD-10-CM

## 2015-07-04 LAB — CBC WITH DIFFERENTIAL/PLATELET
BASOS PCT: 0.6 % (ref 0.0–3.0)
Basophils Absolute: 0 10*3/uL (ref 0.0–0.1)
Eosinophils Absolute: 0.2 10*3/uL (ref 0.0–0.7)
Eosinophils Relative: 3.4 % (ref 0.0–5.0)
HEMATOCRIT: 43.2 % (ref 36.0–46.0)
HEMOGLOBIN: 14.7 g/dL (ref 12.0–15.0)
LYMPHS PCT: 30.6 % (ref 12.0–46.0)
Lymphs Abs: 1.5 10*3/uL (ref 0.7–4.0)
MCHC: 34.1 g/dL (ref 30.0–36.0)
MCV: 96.5 fl (ref 78.0–100.0)
MONO ABS: 0.5 10*3/uL (ref 0.1–1.0)
Monocytes Relative: 10 % (ref 3.0–12.0)
Neutro Abs: 2.6 10*3/uL (ref 1.4–7.7)
Neutrophils Relative %: 55.4 % (ref 43.0–77.0)
Platelets: 225 10*3/uL (ref 150.0–400.0)
RBC: 4.47 Mil/uL (ref 3.87–5.11)
RDW: 13 % (ref 11.5–15.5)
WBC: 4.8 10*3/uL (ref 4.0–10.5)

## 2015-07-04 LAB — TSH: TSH: 4.26 u[IU]/mL (ref 0.35–4.50)

## 2015-07-04 LAB — LIPID PANEL
CHOLESTEROL: 235 mg/dL — AB (ref 0–200)
HDL: 96.3 mg/dL (ref >39.00)
LDL CALC: 126 mg/dL — AB (ref 0–99)
NonHDL: 138.24
Total CHOL/HDL Ratio: 2
Triglycerides: 59 mg/dL (ref 0.0–149.0)
VLDL: 11.8 mg/dL (ref 0.0–40.0)

## 2015-07-04 LAB — COMPREHENSIVE METABOLIC PANEL
ALBUMIN: 4.1 g/dL (ref 3.5–5.2)
ALT: 18 U/L (ref 0–35)
AST: 21 U/L (ref 0–37)
Alkaline Phosphatase: 62 U/L (ref 39–117)
BUN: 13 mg/dL (ref 6–23)
CALCIUM: 9.8 mg/dL (ref 8.4–10.5)
CHLORIDE: 102 meq/L (ref 96–112)
CO2: 32 mEq/L (ref 19–32)
Creatinine, Ser: 0.67 mg/dL (ref 0.40–1.20)
GFR: 91.17 mL/min (ref >60.00)
Glucose, Bld: 85 mg/dL (ref 70–99)
POTASSIUM: 4.4 meq/L (ref 3.5–5.1)
Sodium: 139 mEq/L (ref 135–145)
Total Bilirubin: 0.6 mg/dL (ref 0.2–1.2)
Total Protein: 6.5 g/dL (ref 6.0–8.3)

## 2015-07-04 LAB — VITAMIN D 25 HYDROXY (VIT D DEFICIENCY, FRACTURES): VITD: 78.83 ng/mL (ref 30.00–100.00)

## 2015-07-04 LAB — HEMOGLOBIN A1C: Hgb A1c MFr Bld: 5.1 % (ref 4.6–6.5)

## 2015-07-04 NOTE — Progress Notes (Signed)
Patient is getting her prevnar 13 shot today. Pt tolerated well. The shot was received in her right arm.

## 2015-07-05 ENCOUNTER — Encounter: Payer: Self-pay | Admitting: Internal Medicine

## 2015-08-16 ENCOUNTER — Telehealth: Payer: Self-pay

## 2015-08-16 ENCOUNTER — Other Ambulatory Visit: Payer: Self-pay | Admitting: Internal Medicine

## 2015-08-16 MED ORDER — TEMAZEPAM 15 MG PO CAPS: ORAL_CAPSULE | ORAL | Status: DC

## 2015-08-16 NOTE — Telephone Encounter (Signed)
90 day supply authorized and printed  

## 2015-08-16 NOTE — Telephone Encounter (Signed)
Pt is requesting a refill for Temazepam

## 2015-09-12 ENCOUNTER — Encounter: Payer: Self-pay | Admitting: Cardiovascular Disease

## 2015-09-12 ENCOUNTER — Ambulatory Visit (INDEPENDENT_AMBULATORY_CARE_PROVIDER_SITE_OTHER): Payer: Medicare HMO | Admitting: Cardiovascular Disease

## 2015-09-12 VITALS — BP 132/80 | HR 85 | Ht 63.5 in | Wt 124.5 lb

## 2015-09-12 DIAGNOSIS — I10 Essential (primary) hypertension: Secondary | ICD-10-CM

## 2015-09-12 DIAGNOSIS — I471 Supraventricular tachycardia: Secondary | ICD-10-CM

## 2015-09-12 DIAGNOSIS — E785 Hyperlipidemia, unspecified: Secondary | ICD-10-CM

## 2015-09-12 DIAGNOSIS — I4719 Other supraventricular tachycardia: Secondary | ICD-10-CM

## 2015-09-12 NOTE — Assessment & Plan Note (Signed)
No interested in statin, Suggested she research zetia 10 mg daily

## 2015-09-12 NOTE — Progress Notes (Signed)
Patient ID: Wendy Love, female    DOB: 09-21-1940, 75 y.o.   MRN: [id number]  HPI Comments: [name] is a very pleasant 75 year old woman with previous symptoms of tachycardia, flushing, chest heaviness, started on bystolic 5 mg daily who presents for routine followup of her blood pressure In followup visits,  she did not take any beta blockers.  She is on losartan 100 mg daily. Blood pressures typically run between 120 and 140 Calcium score of 0 in 2007, minimal plaquing in the aortic arch  In follow-up today, significant stress concerning her husband who has cancer He is undergoing chemotherapy, not walking very much, getting weaker Overall she feels well, just has significant stress He denies any tachycardia or palpitations Sometimes misses her amlodipine, blood pressure typically well controlled  EKG on today's visit shows normal sinus rhythm with rate 85 bpm, no significant ST or T-wave changes  Most recent total cholesterol 230s   Allergies  Allergen Reactions  . Brompheniramine-Pseudoeph     REACTION: agitation, nervous, sleepless  . Codeine     REACTION: GI Upset  . Diphenhydramine Hcl     REACTION: nervous, sleepless, agitated  . Loratadine     REACTION: nervous, sleeplessness    Current Outpatient Prescriptions on File Prior to Visit  Medication Sig Dispense Refill  . amLODipine (NORVASC) 2.5 MG tablet Take 1 tablet (2.5 mg total) by mouth daily. 15 tablet 0  . aspirin 81 MG tablet Take 81 mg by mouth daily.     [name] b complex vitamins tablet Take 1 tablet by mouth daily.      . Calcium-Magnesium 500-250 MG TABS Take by mouth 2 (two) times daily.      . Cholecalciferol (VITAMIN D3) 2000 UNITS TABS Take 5,000 Units by mouth daily.     [name] levothyroxine (SYNTHROID, LEVOTHROID) 50 MCG tablet TAKE 1 TABLET EVERY DAY 90 tablet 1  . losartan (COZAAR) 50 MG tablet TAKE 2 TABLETS EVERY DAY 180 tablet 1  . Multiple Vitamin (MULTIVITAMIN) tablet Take 1 tablet by mouth  daily.    . NON FORMULARY Tumeric 2000 mg take 1 daily     . PROBIOTIC CAPS Take by mouth daily.      . Selenium 100 MCG CAPS Take 1 capsule by mouth daily.    . temazepam (RESTORIL) 15 MG capsule TAKE 1 CAPSULE AT BEDTIME AS NEEDED FOR SLEEP 90 capsule 1  . Triamcinolone Acetonide (NASACORT ALLERGY 24HR NA) Place 1 spray into the nose daily as needed.     . vitamin C (ASCORBIC ACID) 500 MG tablet Take 500 mg by mouth daily.    . Zinc 25 MG TABS Take 1 tablet by mouth daily.     No current facility-administered medications on file prior to visit.    Past Medical History  Diagnosis Date  . Hypertension   . Hyperthyroidism   . Depression   . Anxiety   . Rhinosinusitis   . Sciatica   . Bronchitis   . Endometriosis 1974    s/p abdominal surgery, 2nd surgery    Past Surgical History  Procedure Laterality Date  . Carpal tunnel release      bilateral  . Appendectomy    . Hemilaminectomy  April 2010    L3-L4hemi, L4-L5 microdiskectomy  . Endoscopy/colonoscopy  April 2012    Schatski's Ring , small hiatal hernia  . Abdominal hysterectomy  2000    Social History  reports that she has never smoked. She does not have  any smokeless tobacco history on file. She reports that she does not drink alcohol or use illicit drugs.  Family History family history includes Cancer (age of onset: 46) in her mother; Heart disease in her father; Ovarian cancer in her mother.  Review of Systems  Constitutional: Negative.   Respiratory: Negative.   Cardiovascular: Negative.   Gastrointestinal: Negative.   Musculoskeletal: Negative.   Neurological: Negative.   Hematological: Negative.   Psychiatric/Behavioral: The patient is nervous/anxious.   All other systems reviewed and are negative.   BP 132/80 mmHg  Pulse 85  Ht 5' 3.5" (1.613 m)  Wt 124 lb 8 oz (56.473 kg)  BMI 21.71 kg/m2  Physical Exam  Constitutional: She is oriented to person, place, and time. She appears well-developed and  well-nourished.  HENT:  Head: Normocephalic.  Nose: Nose normal.  Mouth/Throat: Oropharynx is clear and moist.  Eyes: Conjunctivae are normal. Pupils are equal, round, and reactive to light.  Neck: Normal range of motion. Neck supple. No JVD present.  Cardiovascular: Normal rate, regular rhythm, S1 normal, S2 normal, normal heart sounds and intact distal pulses.  Exam reveals no gallop and no friction rub.   No murmur heard. Pulmonary/Chest: Effort normal and breath sounds normal. No respiratory distress. She has no wheezes. She has no rales. She exhibits no tenderness.  Abdominal: Soft. Bowel sounds are normal. She exhibits no distension. There is no tenderness.  Musculoskeletal: Normal range of motion. She exhibits no edema or tenderness.  Lymphadenopathy:    She has no cervical adenopathy.  Neurological: She is alert and oriented to person, place, and time. Coordination normal.  Skin: Skin is warm and dry. No rash noted. No erythema.  Psychiatric: She has a normal mood and affect. Her behavior is normal. Judgment and thought content normal.    Assessment and Plan  Nursing note and vitals reviewed.

## 2015-09-12 NOTE — Assessment & Plan Note (Signed)
Blood pressure is well controlled on today's visit. No changes made to the medications. 

## 2015-09-12 NOTE — Patient Instructions (Signed)
You are doing well. No medication changes were made.  Research CT coronary calcium score Call the office if you have worsening chest pain symptoms  Research Zetia for cholesterol (one a day), generic in March  Please call us if you have new issues that need to be addressed before your next appt.  Your physician wants you to follow-up in: 12 months.  You will receive a reminder letter in the mail two months in advance. If you don't receive a letter, please call our office to schedule the follow-up appointment.

## 2015-09-12 NOTE — Assessment & Plan Note (Signed)
She denies any tachycardia concerning for arrhythmia No further workup, no changes to her medications

## 2015-09-25 DIAGNOSIS — H40003 Preglaucoma, unspecified, bilateral: Secondary | ICD-10-CM | POA: Diagnosis not present

## 2015-10-02 DIAGNOSIS — H40003 Preglaucoma, unspecified, bilateral: Secondary | ICD-10-CM | POA: Diagnosis not present

## 2015-11-17 ENCOUNTER — Other Ambulatory Visit: Payer: Self-pay | Admitting: Internal Medicine

## 2015-11-17 MED ORDER — TEMAZEPAM 15 MG PO CAPS: ORAL_CAPSULE | ORAL | Status: DC

## 2015-11-17 NOTE — Telephone Encounter (Signed)
Pt would like to have her temazepam (RESTORIL) 15 MG capsule refilled. Pt would a 90 day supply by mail order to [place]. Pt has also changed local pharmacy to [facility] on [address]. No longer [facility].

## 2015-11-17 NOTE — Telephone Encounter (Signed)
Pt is requesting a 90 day refill on temazepam 15mg  caps. Pt last OV 06/01/15, scheduled appt for 12/05/15. Last filled 08/16/15. Please advise okay to refill, thanks

## 2015-11-17 NOTE — Telephone Encounter (Signed)
Ok to refill. faxt to [facility]

## 2015-11-28 DIAGNOSIS — R69 Illness, unspecified: Secondary | ICD-10-CM | POA: Diagnosis not present

## 2015-12-05 ENCOUNTER — Ambulatory Visit (INDEPENDENT_AMBULATORY_CARE_PROVIDER_SITE_OTHER): Payer: Medicare HMO | Admitting: Internal Medicine

## 2015-12-05 VITALS — BP 130/70 | HR 78 | Temp 97.9°F | Resp 12 | Ht 64.0 in | Wt 126.5 lb

## 2015-12-05 DIAGNOSIS — Z1239 Encounter for other screening for malignant neoplasm of breast: Secondary | ICD-10-CM

## 2015-12-05 DIAGNOSIS — I10 Essential (primary) hypertension: Secondary | ICD-10-CM | POA: Diagnosis not present

## 2015-12-05 DIAGNOSIS — E785 Hyperlipidemia, unspecified: Secondary | ICD-10-CM

## 2015-12-05 NOTE — Progress Notes (Signed)
Subjective:  Patient ID: Wendy Love, female    DOB: Nov 20, 1940  Age: 75 y.o. MRN: [id number]  CC: The primary encounter diagnosis was Breast cancer screening. Diagnoses of Essential hypertension and Hyperlipidemia were also pertinent to this visit.  HPI [name] presents for follow up on hypertension, hypothyrodisim Patient is taking her medications as prescribed and notes no adverse effects.  Home BP readings have been done about once per week and are  generally < 130/80 .  She is avoiding added salt in her diet and walking regularly about 3 times per week for exercise  .  Reviewed  last colonoscopy bc she reports occasional protrusion of fleshy "skin tag' after a hard bowel movement that she has to push back in. [name]  occasionally has streak of blood and minor discomfort/irritation   Lab Results  Component Value Date   TSH 4.26 07/04/2015     Outpatient Prescriptions Prior to Visit  Medication Sig Dispense Refill  . amLODipine (NORVASC) 2.5 MG tablet Take 1 tablet (2.5 mg total) by mouth daily. 15 tablet 0  . aspirin 81 MG tablet Take 81 mg by mouth daily.     [name] b complex vitamins tablet Take 1 tablet by mouth daily.      . Calcium-Magnesium 500-250 MG TABS Take by mouth 2 (two) times daily.      . Cholecalciferol (VITAMIN D3) 2000 UNITS TABS Take 5,000 Units by mouth daily.     [name] levothyroxine (SYNTHROID, LEVOTHROID) 50 MCG tablet TAKE 1 TABLET EVERY DAY 90 tablet 1  . losartan (COZAAR) 50 MG tablet TAKE 2 TABLETS EVERY DAY 180 tablet 1  . Multiple Vitamin (MULTIVITAMIN) tablet Take 1 tablet by mouth daily.    . NON FORMULARY Tumeric 2000 mg take 1 daily     . PROBIOTIC CAPS Take by mouth daily.      . Selenium 100 MCG CAPS Take 1 capsule by mouth daily.    . temazepam (RESTORIL) 15 MG capsule TAKE 1 CAPSULE AT BEDTIME AS NEEDED FOR SLEEP 90 capsule 1  . Triamcinolone Acetonide (NASACORT ALLERGY 24HR NA) Place 1 spray into the nose daily as needed.     .  vitamin C (ASCORBIC ACID) 500 MG tablet Take 500 mg by mouth daily.    . Zinc 25 MG TABS Take 1 tablet by mouth daily.     No facility-administered medications prior to visit.    Review of Systems;  Patient denies headache, fevers, malaise, unintentional weight loss, skin rash, eye pain, sinus congestion and sinus pain, sore throat, dysphagia,  hemoptysis , cough, dyspnea, wheezing, chest pain, palpitations, orthopnea, edema, abdominal pain, nausea, melena, diarrhea, constipation, flank pain, dysuria, hematuria, urinary  Frequency, nocturia, numbness, tingling, seizures,  Focal weakness, Loss of consciousness,  Tremor, insomnia, depression, anxiety, and suicidal ideation.      Objective:  BP 130/70 mmHg  Pulse 78  Temp(Src) 97.9 F (36.6 C) (Oral)  Resp 12  Ht _0  (1.626 m)  Wt 126 lb 8 oz (57.38 kg)  BMI 21.70 kg/m2  SpO2 99%  BP Readings from Last 3 Encounters:  12/05/15 130/70  09/12/15 132/80  06/01/15 130/84    Wt Readings from Last 3 Encounters:  12/05/15 126 lb 8 oz (57.38 kg)  09/12/15 124 lb 8 oz (56.473 kg)  06/01/15 122 lb 8 oz (55.566 kg)    General appearance: alert, cooperative and appears stated age Ears: normal TM's and external ear canals both ears Throat: lips, mucosa,  and tongue normal; teeth and gums normal Neck: no adenopathy, no carotid bruit, supple, symmetrical, trachea midline and thyroid not enlarged, symmetric, no tenderness/mass/nodules Back: symmetric, no curvature. ROM normal. No CVA tenderness. Lungs: clear to auscultation bilaterally Heart: regular rate and rhythm, S1, S2 normal, no murmur, click, rub or gallop Abdomen: soft, non-tender; bowel sounds normal; no masses,  no organomegaly Pulses: 2+ and symmetric Skin: Skin color, texture, turgor normal. No rashes or lesions Lymph nodes: Cervical, supraclavicular, and axillary nodes normal.  Lab Results  Component Value Date   HGBA1C 5.1 07/04/2015   HGBA1C 5.0 06/01/2014   HGBA1C 5.5  10/16/2010    Lab Results  Component Value Date   CREATININE 0.66 12/05/2015   CREATININE 0.67 07/04/2015   CREATININE 0.6 08/30/2014    Lab Results  Component Value Date   WBC 4.8 07/04/2015   HGB 14.7 07/04/2015   HCT 43.2 07/04/2015   PLT 225.0 07/04/2015   GLUCOSE 112* 12/05/2015   CHOL 235* 07/04/2015   TRIG 59.0 07/04/2015   HDL 96.30 07/04/2015   LDLDIRECT 126.6 12/22/2012   LDLCALC 126* 07/04/2015   ALT 20 12/05/2015   AST 26 12/05/2015   NA 135 12/05/2015   K 4.4 12/05/2015   CL 98 12/05/2015   CREATININE 0.66 12/05/2015   BUN 15 12/05/2015   CO2 31 12/05/2015   TSH 4.26 07/04/2015   HGBA1C 5.1 07/04/2015   MICROALBUR 0.2 11/26/2013    Mm Screening Breast Tomo Bilateral  12/29/2014  CLINICAL DATA:  Screening. EXAM: DIGITAL SCREENING BILATERAL MAMMOGRAM WITH 3D TOMO WITH CAD COMPARISON:  Previous exam(s). ACR Breast Density Category c: The breast tissue is heterogeneously dense, which may obscure small masses. FINDINGS: There are no findings suspicious for malignancy. Images were processed with CAD. IMPRESSION: No mammographic evidence of malignancy. A result letter of this screening mammogram will be mailed directly to the patient. RECOMMENDATION: Screening mammogram in one year. (Code:SM-B-01Y) BI-RADS CATEGORY  1: Negative. Electronically Signed   By: Nolon Nations M.D.   On: 12/29/2014 14:56    Assessment & Plan:   Problem List Items Addressed This Visit    Hyperlipidemia    Excellent panel.  No  Medications changes needed Lab Results  Component Value Date   CHOL 235* 07/04/2015   HDL 96.30 07/04/2015   LDLCALC 126* 07/04/2015   LDLDIRECT 126.6 12/22/2012   TRIG 59.0 07/04/2015   CHOLHDL 2 07/04/2015   Lab Results  Component Value Date   ALT 20 12/05/2015   AST 26 12/05/2015   ALKPHOS 71 12/05/2015   BILITOT 0.5 12/05/2015         Essential hypertension    Well controlled on current regimen. Renal function stable, no changes today.  Lab  Results  Component Value Date   CREATININE 0.66 12/05/2015   Lab Results  Component Value Date   NA 135 12/05/2015   K 4.4 12/05/2015   CL 98 12/05/2015   CO2 31 12/05/2015         Relevant Orders   Comp Met (CMET) (Completed)    Other Visit Diagnoses    Breast cancer screening    -  Primary    Relevant Orders    MM DIGITAL SCREENING BILATERAL       I am having Ms. Faeth maintain her aspirin, b complex vitamins, Vitamin D3, Calcium-Magnesium, Probiotic, NON FORMULARY, multivitamin, Triamcinolone Acetonide (NASACORT ALLERGY 24HR NA), vitamin C, Zinc, levothyroxine, amLODipine, Selenium, losartan, and temazepam.  No orders of the defined types  were placed in this encounter.    There are no discontinued medications.  Follow-up: No Follow-up on file.   Crecencio Mc, MD

## 2015-12-05 NOTE — Progress Notes (Signed)
Pre-visit discussion using our clinic review tool. No additional management support is needed unless otherwise documented below in the visit note.  

## 2015-12-05 NOTE — Patient Instructions (Signed)
Wendy Love is a sparkling probiotic that Wendy Love may like the taste of

## 2015-12-06 LAB — COMPREHENSIVE METABOLIC PANEL
ALBUMIN: 4.5 g/dL (ref 3.5–5.2)
ALT: 20 U/L (ref 0–35)
AST: 26 U/L (ref 0–37)
Alkaline Phosphatase: 71 U/L (ref 39–117)
BILIRUBIN TOTAL: 0.5 mg/dL (ref 0.2–1.2)
BUN: 15 mg/dL (ref 6–23)
CO2: 31 mEq/L (ref 19–32)
CREATININE: 0.66 mg/dL (ref 0.40–1.20)
Calcium: 9.9 mg/dL (ref 8.4–10.5)
Chloride: 98 mEq/L (ref 96–112)
GFR: 92.66 mL/min (ref >60.00)
Glucose, Bld: 112 mg/dL — ABNORMAL HIGH (ref 70–99)
Potassium: 4.4 mEq/L (ref 3.5–5.1)
Sodium: 135 mEq/L (ref 135–145)
Total Protein: 7.3 g/dL (ref 6.0–8.3)

## 2015-12-06 NOTE — Assessment & Plan Note (Signed)
Excellent panel.  No  Medications changes needed Lab Results  Component Value Date   CHOL 235* 07/04/2015   HDL 96.30 07/04/2015   LDLCALC 126* 07/04/2015   LDLDIRECT 126.6 12/22/2012   TRIG 59.0 07/04/2015   CHOLHDL 2 07/04/2015   Lab Results  Component Value Date   ALT 20 12/05/2015   AST 26 12/05/2015   ALKPHOS 71 12/05/2015   BILITOT 0.5 12/05/2015

## 2015-12-06 NOTE — Assessment & Plan Note (Signed)
Well controlled on current regimen. Renal function stable, no changes today.  Lab Results  Component Value Date   CREATININE 0.66 12/05/2015   Lab Results  Component Value Date   NA 135 12/05/2015   K 4.4 12/05/2015   CL 98 12/05/2015   CO2 31 12/05/2015

## 2015-12-07 ENCOUNTER — Encounter: Payer: Self-pay | Admitting: Internal Medicine

## 2016-03-25 ENCOUNTER — Telehealth: Payer: Self-pay | Admitting: *Deleted

## 2016-03-25 MED ORDER — LEVOTHYROXINE SODIUM 50 MCG PO TABS: 50.0000 ug | ORAL_TABLET | Freq: Every day | ORAL | Status: DC

## 2016-03-25 NOTE — Telephone Encounter (Signed)
Sent to last pharmacy on file, as I attempted to reach patient, unable to get on the phone.  Thanks

## 2016-03-25 NOTE — Telephone Encounter (Signed)
Patient requested a Rx refill for levothyroxine, 90 day supply. Pharmacy [facility] mail order Phone [phone]

## 2016-03-25 NOTE — Telephone Encounter (Signed)
Pt wants her Rx to go to mail order  [facility] home delivery  Phone is [phone]. Please call if a issue comes up. Thank you!

## 2016-04-01 DIAGNOSIS — H40003 Preglaucoma, unspecified, bilateral: Secondary | ICD-10-CM | POA: Diagnosis not present

## 2016-05-12 ENCOUNTER — Other Ambulatory Visit: Payer: Self-pay | Admitting: Internal Medicine

## 2016-05-14 NOTE — Telephone Encounter (Signed)
Last filled on 11/17/15 #90 +1. Last OV 12/05/15. Ok to refill?

## 2016-05-20 ENCOUNTER — Other Ambulatory Visit: Payer: Self-pay | Admitting: *Deleted

## 2016-05-20 ENCOUNTER — Telehealth: Payer: Self-pay | Admitting: Internal Medicine

## 2016-05-20 MED ORDER — LOSARTAN POTASSIUM 50 MG PO TABS: 100.0000 mg | ORAL_TABLET | Freq: Every day | ORAL | 2 refills | Status: DC

## 2016-05-20 NOTE — Telephone Encounter (Signed)
Pt called needs refill on losartn 50mg  take 2 day 90 supply [facility] [address]

## 2016-05-23 ENCOUNTER — Encounter: Payer: Self-pay | Admitting: Internal Medicine

## 2016-05-23 ENCOUNTER — Ambulatory Visit (INDEPENDENT_AMBULATORY_CARE_PROVIDER_SITE_OTHER): Payer: Medicare HMO | Admitting: Internal Medicine

## 2016-05-23 VITALS — BP 146/88 | HR 75 | Temp 98.2°F | Resp 16 | Ht 62.5 in | Wt 123.1 lb

## 2016-05-23 DIAGNOSIS — E034 Atrophy of thyroid (acquired): Secondary | ICD-10-CM | POA: Diagnosis not present

## 2016-05-23 DIAGNOSIS — D1801 Hemangioma of skin and subcutaneous tissue: Secondary | ICD-10-CM | POA: Diagnosis not present

## 2016-05-23 DIAGNOSIS — L821 Other seborrheic keratosis: Secondary | ICD-10-CM | POA: Diagnosis not present

## 2016-05-23 DIAGNOSIS — G4700 Insomnia, unspecified: Secondary | ICD-10-CM

## 2016-05-23 DIAGNOSIS — I10 Essential (primary) hypertension: Secondary | ICD-10-CM | POA: Diagnosis not present

## 2016-05-23 DIAGNOSIS — Z08 Encounter for follow-up examination after completed treatment for malignant neoplasm: Secondary | ICD-10-CM | POA: Diagnosis not present

## 2016-05-23 DIAGNOSIS — E038 Other specified hypothyroidism: Secondary | ICD-10-CM | POA: Diagnosis not present

## 2016-05-23 DIAGNOSIS — Z85828 Personal history of other malignant neoplasm of skin: Secondary | ICD-10-CM | POA: Diagnosis not present

## 2016-05-23 LAB — COMPREHENSIVE METABOLIC PANEL
ALT: 21 U/L (ref 0–35)
AST: 23 U/L (ref 0–37)
Albumin: 4.5 g/dL (ref 3.5–5.2)
Alkaline Phosphatase: 57 U/L (ref 39–117)
BILIRUBIN TOTAL: 0.5 mg/dL (ref 0.2–1.2)
BUN: 7 mg/dL (ref 6–23)
CO2: 30 mEq/L (ref 19–32)
CREATININE: 0.62 mg/dL (ref 0.40–1.20)
Calcium: 9.3 mg/dL (ref 8.4–10.5)
Chloride: 98 mEq/L (ref 96–112)
GFR: 99.47 mL/min (ref >60.00)
GLUCOSE: 95 mg/dL (ref 70–99)
Potassium: 4.2 mEq/L (ref 3.5–5.1)
Sodium: 133 mEq/L — ABNORMAL LOW (ref 135–145)
Total Protein: 7.1 g/dL (ref 6.0–8.3)

## 2016-05-23 LAB — TSH: TSH: 1.72 u[IU]/mL (ref 0.35–4.50)

## 2016-05-23 NOTE — Progress Notes (Signed)
Subjective:  Patient ID: Wendy Love, female    DOB: 1941/07/30  Age: 75 y.o. MRN: [id number]  CC: The primary encounter diagnosis was Hypothyroidism due to acquired atrophy of thyroid. Diagnoses of Essential hypertension and INSOMNIA, CHRONIC were also pertinent to this visit.  HPI Wendy Love presents for follow up on hypertension, hypothyroidism, and insomnia.   . Patient is taking her medications as prescribed and notes no adverse effects.  Home BP readings have been done about once per week and are  generally < 130/80 .  She is avoiding added salt in her diet and walking regularly about 3 times per week for exercise.  She continues to require daily use of temazepam to fall asleep and stay asleep.  She hs tried other medications without success .   Outpatient Medications Prior to Visit  Medication Sig Dispense Refill  . amLODipine (NORVASC) 2.5 MG tablet Take 1 tablet (2.5 mg total) by mouth daily. 15 tablet 0  . b complex vitamins tablet Take 1 tablet by mouth daily.      . Calcium-Magnesium 500-250 MG TABS Take by mouth 2 (two) times daily.      . Cholecalciferol (VITAMIN D3) 2000 UNITS TABS Take 5,000 Units by mouth daily.     [name] levothyroxine (SYNTHROID, LEVOTHROID) 50 MCG tablet Take 1 tablet (50 mcg total) by mouth daily. 90 tablet 1  . losartan (COZAAR) 50 MG tablet Take 2 tablets (100 mg total) by mouth daily. 180 tablet 2  . Multiple Vitamin (MULTIVITAMIN) tablet Take 1 tablet by mouth daily.    . NON FORMULARY Tumeric 2000 mg take 1 daily     . PROBIOTIC CAPS Take by mouth daily.      . Selenium 100 MCG CAPS Take 1 capsule by mouth daily.    . temazepam (RESTORIL) 15 MG capsule TAKE 1 CAPSULE BY MOUTH EVERY NIGHT AT BEDTIME AS NEEDED FOR SLEEP 90 capsule 3  . Triamcinolone Acetonide (NASACORT ALLERGY 24HR NA) Place 1 spray into the nose daily as needed.     . vitamin C (ASCORBIC ACID) 500 MG tablet Take 500 mg by mouth daily.    . Zinc 25 MG TABS Take 1  tablet by mouth daily.    [name] aspirin 81 MG tablet Take 81 mg by mouth daily.      No facility-administered medications prior to visit.     Review of Systems;  Patient denies headache, fevers, malaise, unintentional weight loss, skin rash, eye pain, sinus congestion and sinus pain, sore throat, dysphagia,  hemoptysis , cough, dyspnea, wheezing, chest pain, palpitations, orthopnea, edema, abdominal pain, nausea, melena, diarrhea, constipation, flank pain, dysuria, hematuria, urinary  Frequency, nocturia, numbness, tingling, seizures,  Focal weakness, Loss of consciousness,  Tremor, insomnia, depression, anxiety, and suicidal ideation.      Objective:  BP (!) 146/88 (BP Location: Right Arm, Patient Position: Sitting, Cuff Size: Normal)   Pulse 75   Temp 98.2 F (36.8 C) (Oral)   Resp 16   Ht 5' 2.5" (1.588 m)   Wt 123 lb 1.9 oz (55.8 kg)   LMP  (Exact Date)   SpO2 97%   BMI 22.16 kg/m   BP Readings from Last 3 Encounters:  05/23/16 (!) 146/88  12/05/15 130/70  09/12/15 132/80    Wt Readings from Last 3 Encounters:  05/23/16 123 lb 1.9 oz (55.8 kg)  12/05/15 126 lb 8 oz (57.4 kg)  09/12/15 124 lb 8 oz (56.5 kg)    General  appearance: alert, cooperative and appears stated age Ears: normal TM's and external ear canals both ears Throat: lips, mucosa, and tongue normal; teeth and gums normal Neck: no adenopathy, no carotid bruit, supple, symmetrical, trachea midline and thyroid not enlarged, symmetric, no tenderness/mass/nodules Back: symmetric, no curvature. ROM normal. No CVA tenderness. Lungs: clear to auscultation bilaterally Heart: regular rate and rhythm, S1, S2 normal, no murmur, click, rub or gallop Abdomen: soft, non-tender; bowel sounds normal; no masses,  no organomegaly Pulses: 2+ and symmetric Skin: Skin color, texture, turgor normal. No rashes or lesions Lymph nodes: Cervical, supraclavicular, and axillary nodes normal.  Lab Results  Component Value Date    HGBA1C 5.1 07/04/2015   HGBA1C 5.0 06/01/2014   HGBA1C 5.5 10/16/2010    Lab Results  Component Value Date   CREATININE 0.62 05/23/2016   CREATININE 0.66 12/05/2015   CREATININE 0.67 07/04/2015    Lab Results  Component Value Date   WBC 4.8 07/04/2015   HGB 14.7 07/04/2015   HCT 43.2 07/04/2015   PLT 225.0 07/04/2015   GLUCOSE 95 05/23/2016   CHOL 235 (H) 07/04/2015   TRIG 59.0 07/04/2015   HDL 96.30 07/04/2015   LDLDIRECT 126.6 12/22/2012   LDLCALC 126 (H) 07/04/2015   ALT 21 05/23/2016   AST 23 05/23/2016   NA 133 (L) 05/23/2016   K 4.2 05/23/2016   CL 98 05/23/2016   CREATININE 0.62 05/23/2016   BUN 7 05/23/2016   CO2 30 05/23/2016   TSH 1.72 05/23/2016   HGBA1C 5.1 07/04/2015   MICROALBUR 0.2 11/26/2013    Mm Screening Breast Tomo Bilateral  Result Date: 12/29/2014 CLINICAL DATA:  Screening. EXAM: DIGITAL SCREENING BILATERAL MAMMOGRAM WITH 3D TOMO WITH CAD COMPARISON:  Previous exam(s). ACR Breast Density Category c: The breast tissue is heterogeneously dense, which may obscure small masses. FINDINGS: There are no findings suspicious for malignancy. Images were processed with CAD. IMPRESSION: No mammographic evidence of malignancy. A result letter of this screening mammogram will be mailed directly to the patient. RECOMMENDATION: Screening mammogram in one year. (Code:SM-B-01Y) BI-RADS CATEGORY  1: Negative. Electronically Signed   By: Nolon Nations M.D.   On: 12/29/2014 14:56    Assessment & Plan:   Problem List Items Addressed This Visit    Hypothyroidism - Primary    Thyroid function is WNL on current dose.  No current changes needed.   Lab Results  Component Value Date   TSH 1.72 05/23/2016         Relevant Orders   TSH (Completed)   INSOMNIA, CHRONIC    Her insomnia is aggravated  by the serious health conditions of her husband , who has had progression of his metastatic prostate CA and has decided to forgo any additional treatment .   she  requires use of temazepam to relax enough to fall asleep.  Bedtime habits reviewed.  The risks and benefits of benzodiazepine use were reviewed with patient today including excessive sedation leading to respiratory depression,  impaired thinking/driving, and addiction.  Patient was advised to avoid concurrent use with alcohol, to use medication only as needed and not to share with others  . [name]       Essential hypertension    Well controlled on current regimen. Renal function stable, no changes today.  Lab Results  Component Value Date   CREATININE 0.62 05/23/2016   Lab Results  Component Value Date   NA 133 (L) 05/23/2016   K 4.2 05/23/2016   CL 98 05/23/2016  CO2 30 05/23/2016         Relevant Orders   Comprehensive metabolic panel (Completed)    Other Visit Diagnoses   None.    A total of 25 minutes of face to face time was spent with patient more than half of which was spent in counselling about the above mentioned conditions  and coordination of care  I am having Ms. Prayer maintain her aspirin, b complex vitamins, Vitamin D3, Calcium-Magnesium, Probiotic, NON FORMULARY, multivitamin, Triamcinolone Acetonide (NASACORT ALLERGY 24HR NA), vitamin C, Zinc, amLODipine, Selenium, levothyroxine, temazepam, and losartan.  No orders of the defined types were placed in this encounter.   There are no discontinued medications.  Follow-up: Return for husband ED needs RN visit ASAP for B12 .   Jade Burkard, Aris Everts, MD

## 2016-05-23 NOTE — Patient Instructions (Signed)
[place] sea bass is the closest thing  To porkfat  that have ever tasted!!   Make a RN visit for Ed to have a B12 injection

## 2016-05-25 NOTE — Assessment & Plan Note (Signed)
Thyroid function is WNL on current dose.  No current changes needed.   Lab Results  Component Value Date   TSH 1.72 05/23/2016

## 2016-05-25 NOTE — Assessment & Plan Note (Signed)
Well controlled on current regimen. Renal function stable, no changes today.  Lab Results  Component Value Date   CREATININE 0.62 05/23/2016   Lab Results  Component Value Date   NA 133 (L) 05/23/2016   K 4.2 05/23/2016   CL 98 05/23/2016   CO2 30 05/23/2016

## 2016-05-25 NOTE — Assessment & Plan Note (Signed)
Her insomnia is aggravated  by the serious health conditions of her husband , who has had progression of his metastatic prostate CA and has decided to forgo any additional treatment .   she requires use of temazepam to relax enough to fall asleep.  Bedtime habits reviewed.  The risks and benefits of benzodiazepine use were reviewed with patient today including excessive sedation leading to respiratory depression,  impaired thinking/driving, and addiction.  Patient was advised to avoid concurrent use with alcohol, to use medication only as needed and not to share with others  . [name]

## 2016-05-26 ENCOUNTER — Encounter: Payer: Self-pay | Admitting: Internal Medicine

## 2016-06-05 ENCOUNTER — Ambulatory Visit: Payer: Medicare HMO | Admitting: Internal Medicine

## 2016-06-11 ENCOUNTER — Encounter (INDEPENDENT_AMBULATORY_CARE_PROVIDER_SITE_OTHER): Payer: Self-pay

## 2016-08-15 ENCOUNTER — Telehealth: Payer: Self-pay | Admitting: Internal Medicine

## 2016-08-15 DIAGNOSIS — I10 Essential (primary) hypertension: Secondary | ICD-10-CM

## 2016-08-15 MED ORDER — AMLODIPINE BESYLATE 2.5 MG PO TABS: 2.5000 mg | ORAL_TABLET | Freq: Every day | ORAL | 1 refills | Status: DC

## 2016-08-15 NOTE — Telephone Encounter (Signed)
Refill sent.

## 2016-08-15 NOTE — Telephone Encounter (Signed)
Pt called and is requesting a refill on amLODipine (NORVASC) 2.5 MG tablet. She would like a 90 day supply. Thank you!  Pharmacy - [facility] [place], [place] - [address]  Call pt @ [phone]

## 2016-09-11 ENCOUNTER — Ambulatory Visit: Payer: Medicare HMO | Admitting: Cardiovascular Disease

## 2016-09-16 ENCOUNTER — Telehealth: Payer: Self-pay | Admitting: Internal Medicine

## 2016-09-16 NOTE — Telephone Encounter (Signed)
Please extend our condolences to Ms Wendy Love and tell her that if there's anything she needs to get her through the next couple of days,  Let me know.

## 2016-09-24 NOTE — Telephone Encounter (Signed)
Card mailed to patient .

## 2016-09-30 ENCOUNTER — Other Ambulatory Visit: Payer: Self-pay

## 2016-09-30 MED ORDER — LEVOTHYROXINE SODIUM 50 MCG PO TABS: 50.0000 ug | ORAL_TABLET | Freq: Every day | ORAL | 1 refills | Status: DC

## 2016-10-01 DIAGNOSIS — H40003 Preglaucoma, unspecified, bilateral: Secondary | ICD-10-CM | POA: Diagnosis not present

## 2016-10-08 DIAGNOSIS — H40003 Preglaucoma, unspecified, bilateral: Secondary | ICD-10-CM | POA: Diagnosis not present

## 2016-10-22 ENCOUNTER — Other Ambulatory Visit: Payer: Self-pay | Admitting: Internal Medicine

## 2016-10-22 NOTE — Telephone Encounter (Signed)
Due to cost patient wants to fill at [facility]

## 2016-10-23 MED ORDER — LOSARTAN POTASSIUM 50 MG PO TABS: 100.0000 mg | ORAL_TABLET | Freq: Every day | ORAL | 2 refills | Status: DC

## 2016-10-23 NOTE — Telephone Encounter (Signed)
Refilled and printed.

## 2016-10-24 NOTE — Telephone Encounter (Signed)
Rx faxed to pharmacy  

## 2016-11-20 ENCOUNTER — Encounter: Payer: Self-pay | Admitting: Internal Medicine

## 2016-11-20 ENCOUNTER — Ambulatory Visit (INDEPENDENT_AMBULATORY_CARE_PROVIDER_SITE_OTHER): Payer: Medicare HMO | Admitting: Internal Medicine

## 2016-11-20 DIAGNOSIS — F4320 Adjustment disorder, unspecified: Secondary | ICD-10-CM | POA: Diagnosis not present

## 2016-11-20 DIAGNOSIS — E7800 Pure hypercholesterolemia, unspecified: Secondary | ICD-10-CM

## 2016-11-20 DIAGNOSIS — E034 Atrophy of thyroid (acquired): Secondary | ICD-10-CM | POA: Diagnosis not present

## 2016-11-20 DIAGNOSIS — R69 Illness, unspecified: Secondary | ICD-10-CM | POA: Diagnosis not present

## 2016-11-20 DIAGNOSIS — H6123 Impacted cerumen, bilateral: Secondary | ICD-10-CM

## 2016-11-20 DIAGNOSIS — I471 Supraventricular tachycardia: Secondary | ICD-10-CM | POA: Diagnosis not present

## 2016-11-20 DIAGNOSIS — G4700 Insomnia, unspecified: Secondary | ICD-10-CM

## 2016-11-20 DIAGNOSIS — F4321 Adjustment disorder with depressed mood: Secondary | ICD-10-CM

## 2016-11-20 DIAGNOSIS — E559 Vitamin D deficiency, unspecified: Secondary | ICD-10-CM

## 2016-11-20 DIAGNOSIS — Z0001 Encounter for general adult medical examination with abnormal findings: Secondary | ICD-10-CM

## 2016-11-20 DIAGNOSIS — I10 Essential (primary) hypertension: Secondary | ICD-10-CM

## 2016-11-20 DIAGNOSIS — Z0000 Encounter for general adult medical examination without abnormal findings: Secondary | ICD-10-CM

## 2016-11-20 DIAGNOSIS — R5383 Other fatigue: Secondary | ICD-10-CM | POA: Diagnosis not present

## 2016-11-20 MED ORDER — TEMAZEPAM 15 MG PO CAPS: ORAL_CAPSULE | ORAL | 3 refills | Status: DC

## 2016-11-20 NOTE — Progress Notes (Signed)
Pre visit review using our clinic review tool, if applicable. No additional management support is needed unless otherwise documented below in the visit note. 

## 2016-11-20 NOTE — Progress Notes (Signed)
Patient ID: Wendy Love, female    DOB: March 03, 1941  Age: 76 y.o. MRN: [id number]  The patient is here for annual comprehensive examination and management of other chronic and acute problems. The risk factors are reflected in the social history.  The roster of all physicians providing medical care to patient - is listed in the Snapshot section of the chart.  Activities of daily living:  The patient is 100% independent in all ADLs: dressing, toileting, feeding as well as independent mobility  Home safety : The patient has smoke detectors in the home. They wear seatbelts.  There are no firearms at home. There is no violence in the home.   There is no risks for hepatitis, STDs or HIV. There is no   history of blood transfusion. They have no travel history to infectious disease endemic areas of the world.  The patient has seen their dentist in the last six month. They have seen their eye doctor in the last year. They admit to slight hearing difficulty with regard to whispered voices and some television programs.  They have deferred audiologic testing in the last year.  They do not  have excessive sun exposure. Discussed the need for sun protection: hats, long sleeves and use of sunscreen if there is significant sun exposure.   Diet: the importance of a healthy diet is discussed. They do have a healthy diet.  The benefits of regular aerobic exercise were discussed. She walks 4 times per week ,  20 minutes.   Depression screen: there are no signs or vegative symptoms of depression- irritability, change in appetite, anhedonia, sadness/tearfullness.  Cognitive assessment: the patient manages all their financial and personal affairs and is actively engaged. They could relate day,date,year and events; recalled 2/3 objects at 3 minutes; performed clock-face test normally.  The following portions of the patient's history were reviewed and updated as appropriate: allergies, current medications, past  family history, past medical history,  past surgical history, past social history  and problem list.  Visual acuity was not assessed per patient preference since she has regular follow up with her ophthalmologist. Hearing and body mass index were assessed and reviewed.   During the course of the visit the patient was educated and counseled about appropriate screening and preventive services including : fall prevention , diabetes screening, nutrition counseling, colorectal cancer screening, and recommended immunizations.    CC: Diagnoses of Hypothyroidism due to acquired atrophy of thyroid, Pure hypercholesterolemia, Essential hypertension, Vitamin D deficiency, Fatigue, unspecified type, Bilateral impacted cerumen, Grief, Atrial tachycardia, paroxysmal ([place]), Medicare annual wellness visit, subsequent, and INSOMNIA, CHRONIC were pertinent to this visit.     she is grieving loss of husband Ed who died in early 11/03/22 she is attending Hospice grief counselling. She has gained several lbs due to eating poorly and suspending her exercise regimen.     History Onita has a past medical history of Anxiety; Bronchitis; Depression; Endometriosis (1974); Hypertension; Hyperthyroidism; Rhinosinusitis; and Sciatica.   She has a past surgical history that includes Carpal tunnel release; Appendectomy; hemilaminectomy (April 2010); Endoscopy/colonoscopy (April 2012); and Abdominal hysterectomy (2000).   Her family history includes Cancer (age of onset: 59) in her mother; Heart disease in her father; Ovarian cancer in her mother.She reports that she has never smoked. She has never used smokeless tobacco. She reports that she does not drink alcohol or use drugs.  Outpatient Medications Prior to Visit  Medication Sig Dispense Refill  . amLODipine (NORVASC) 2.5 MG tablet Take 1  tablet (2.5 mg total) by mouth daily. 90 tablet 1  . b complex vitamins tablet Take 1 tablet by mouth daily.      .  Calcium-Magnesium 500-250 MG TABS Take by mouth 2 (two) times daily.      . Cholecalciferol (VITAMIN D3) 2000 UNITS TABS Take 5,000 Units by mouth daily.     [name] levothyroxine (SYNTHROID, LEVOTHROID) 50 MCG tablet Take 1 tablet (50 mcg total) by mouth daily. 90 tablet 1  . losartan (COZAAR) 50 MG tablet Take 2 tablets (100 mg total) by mouth daily. 180 tablet 2  . Multiple Vitamin (MULTIVITAMIN) tablet Take 1 tablet by mouth daily.    [name] PROBIOTIC CAPS Take by mouth daily.      . Triamcinolone Acetonide (NASACORT ALLERGY 24HR NA) Place 1 spray into the nose daily as needed.     . vitamin C (ASCORBIC ACID) 500 MG tablet Take 500 mg by mouth daily.    . Zinc 25 MG TABS Take 1 tablet by mouth daily.    [name] aspirin 81 MG tablet Take 81 mg by mouth daily.     . NON FORMULARY Tumeric 2000 mg take 1 daily     . Selenium 100 MCG CAPS Take 1 capsule by mouth daily.    . temazepam (RESTORIL) 15 MG capsule TAKE 1 CAPSULE BY MOUTH AT BEDTIME AS NEEDED FOR SLEEP 90 capsule 1   No facility-administered medications prior to visit.     Review of Systems   Patient denies headache, fevers, malaise, unintentional weight loss, skin rash, eye pain, sinus congestion and sinus pain, sore throat, dysphagia,  hemoptysis , cough, dyspnea, wheezing, chest pain, palpitations, orthopnea, edema, abdominal pain, nausea, melena, diarrhea, constipation, flank pain, dysuria, hematuria, urinary  Frequency, nocturia, numbness, tingling, seizures,  Focal weakness, Loss of consciousness,  Tremor,  depression, anxiety, and suicidal ideation.      Objective:  BP (!) 160/98 (BP Location: Left Arm, Patient Position: Sitting, Cuff Size: Normal)   Pulse 75   Resp 16   Ht 5' 2.5" (1.588 m)   Wt 128 lb 3.2 oz (58.2 kg)   SpO2 98%   BMI 23.07 kg/m   Physical Exam   General appearance: alert, cooperative and appears stated age Head: Normocephalic, without obvious abnormality, atraumatic Eyes: conjunctivae/corneas clear. PERRL,  EOM's intact. Fundi benign. Ears: bilateral oclusion by ear wax  Nose: Nares normal. Septum midline. Mucosa normal. No drainage or sinus tenderness. Throat: lips, mucosa, and tongue normal; teeth and gums normal Neck: no adenopathy, no carotid bruit, no JVD, supple, symmetrical, trachea midline and thyroid not enlarged, symmetric, no tenderness/mass/nodules Lungs: clear to auscultation bilaterally Breasts: normal appearance, no masses or tenderness Heart: regular rate and rhythm, S1, S2 normal, no murmur, click, rub or gallop Abdomen: soft, non-tender; bowel sounds normal; no masses,  no organomegaly Extremities: extremities normal, atraumatic, no cyanosis or edema Pulses: 2+ and symmetric Skin: Skin color, texture, turgor normal. No rashes or lesions Neurologic: Alert and oriented X 3, normal strength and tone. Normal symmetric reflexes. Normal coordination and gait.     Assessment & Plan:   Problem List Items Addressed This Visit    Atrial tachycardia, paroxysmal ([place])    Currently controlled. No recent episodes.  No medication changes today      Bilateral impacted cerumen    Irrigation to be done today by RN staff       Relevant Orders   Ear Lavage   Essential hypertension    she reports  compliance with medication regimen  but has an elevated reading today in office.  sHe has been asked to check his BP at home and  submit readings for evaluation. Renal function will be checked   Lab Results  Component Value Date   CREATININE 0.62 05/23/2016   Lab Results  Component Value Date   NA 133 (L) 05/23/2016   K 4.2 05/23/2016   CL 98 05/23/2016   CO2 30 05/23/2016         Relevant Orders   Comprehensive metabolic panel   Grief reaction    attending a 13 week grief counselling program called "griefshare" at [facility]       Hyperlipidemia   Relevant Orders   Lipid panel   Hypothyroidism    Thyroid function assessment is due  Lab Results  Component Value Date    TSH 1.72 05/23/2016         Relevant Orders   TSH   INSOMNIA, CHRONIC    Her insomnia is chronic and aggravated  by the recent death of her husband.   she requires use of temazepam to relax enough to fall asleep.  Bedtime habits reviewed.  The risks and benefits of benzodiazepine use were reviewed with patient today including excessive sedation leading to respiratory depression,  impaired thinking/driving, and addiction.  Patient was advised to avoid concurrent use with alcohol, to use medication only as needed and not to share with others  . [name]       Medicare annual wellness visit, subsequent    Annual Medicare wellness  exam was done as well as a comprehensive physical exam and management of acute and chronic conditions .  During the course of the visit the patient was educated and counseled about appropriate screening and preventive services including : fall prevention , diabetes screening, nutrition counseling, colorectal cancer screening, and recommended immunizations.  Printed recommendations for health maintenance screenings was given.        Other Visit Diagnoses    Vitamin D deficiency       Relevant Orders   VITAMIN D 25 Hydroxy (Vit-D Deficiency, Fractures)   Fatigue, unspecified type       Relevant Orders   CBC with Differential/Platelet      I have discontinued Ms. O'Reilly's aspirin, NON FORMULARY, and Selenium. I am also having her maintain her b complex vitamins, Vitamin D3, Calcium-Magnesium, Probiotic, multivitamin, Triamcinolone Acetonide (NASACORT ALLERGY 24HR NA), vitamin C, Zinc, amLODipine, levothyroxine, losartan, and temazepam.  Meds ordered this encounter  Medications  . temazepam (RESTORIL) 15 MG capsule    Sig: TAKE 1 CAPSULE BY MOUTH AT BEDTIME AS NEEDED FOR SLEEP    Dispense:  90 capsule    Refill:  3    Medications Discontinued During This Encounter  Medication Reason  . aspirin 81 MG tablet Patient has not taken in last 30 days  . NON FORMULARY  Patient has not taken in last 30 days  . Selenium 100 MCG CAPS Patient has not taken in last 30 days  . temazepam (RESTORIL) 15 MG capsule Reorder    Follow-up: Return in about 6 months (around 05/23/2017) for fasting labs ASAP .   Crecencio Mc, MD

## 2016-11-20 NOTE — Assessment & Plan Note (Signed)
attending a 13 week grief counselling program called "griefshare" at [facility]

## 2016-11-20 NOTE — Patient Instructions (Signed)
Health Maintenance for Postmenopausal Women Menopause is a normal process in which your reproductive ability comes to an end. This process happens gradually over a span of months to years, usually between the ages of 33 and 38. Menopause is complete when you have missed 12 consecutive menstrual periods. It is important to talk with your health care provider about some of the most common conditions that affect postmenopausal women, such as heart disease, cancer, and bone loss (osteoporosis). Adopting a healthy lifestyle and getting preventive care can help to promote your health and wellness. Those actions can also lower your chances of developing some of these common conditions. What should I know about menopause? During menopause, you may experience a number of symptoms, such as:  Moderate-to-severe hot flashes.  Night sweats.  Decrease in sex drive.  Mood swings.  Headaches.  Tiredness.  Irritability.  Memory problems.  Insomnia. Choosing to treat or not to treat menopausal changes is an individual decision that you make with your health care provider. What should I know about hormone replacement therapy and supplements? Hormone therapy products are effective for treating symptoms that are associated with menopause, such as hot flashes and night sweats. Hormone replacement carries certain risks, especially as you become older. If you are thinking about using estrogen or estrogen with progestin treatments, discuss the benefits and risks with your health care provider. What should I know about heart disease and stroke? Heart disease, heart attack, and stroke become more likely as you age. This may be due, in part, to the hormonal changes that your body experiences during menopause. These can affect how your body processes dietary fats, triglycerides, and cholesterol. Heart attack and stroke are both medical emergencies. There are many things that you can do to help prevent heart disease  and stroke:  Have your blood pressure checked at least every 1-2 years. High blood pressure causes heart disease and increases the risk of stroke.  If you are 48-61 years old, ask your health care provider if you should take aspirin to prevent a heart attack or a stroke.  Do not use any tobacco products, including cigarettes, chewing tobacco, or electronic cigarettes. If you need help quitting, ask your health care provider.  It is important to eat a healthy diet and maintain a healthy weight.  Be sure to include plenty of vegetables, fruits, low-fat dairy products, and lean protein.  Avoid eating foods that are high in solid fats, added sugars, or salt (sodium).  Get regular exercise. This is one of the most important things that you can do for your health.  Try to exercise for at least 150 minutes each week. The type of exercise that you do should increase your heart rate and make you sweat. This is known as moderate-intensity exercise.  Try to do strengthening exercises at least twice each week. Do these in addition to the moderate-intensity exercise.  Know your numbers.Ask your health care provider to check your cholesterol and your blood glucose. Continue to have your blood tested as directed by your health care provider. What should I know about cancer screening? There are several types of cancer. Take the following steps to reduce your risk and to catch any cancer development as early as possible. Breast Cancer  Practice breast self-awareness.  This means understanding how your breasts normally appear and feel.  It also means doing regular breast self-exams. Let your health care provider know about any changes, no matter how small.  If you are 40 or older,  have a clinician do a breast exam (clinical breast exam or CBE) every year. Depending on your age, family history, and medical history, it may be recommended that you also have a yearly breast X-ray (mammogram).  If you  have a family history of breast cancer, talk with your health care provider about genetic screening.  If you are at high risk for breast cancer, talk with your health care provider about having an MRI and a mammogram every year.  Breast cancer (BRCA) gene test is recommended for women who have family members with BRCA-related cancers. Results of the assessment will determine the need for genetic counseling and BRCA1 and for BRCA2 testing. BRCA-related cancers include these types:  Breast. This occurs in males or females.  Ovarian.  Tubal. This may also be called fallopian tube cancer.  Cancer of the abdominal or pelvic lining (peritoneal cancer).  Prostate.  Pancreatic. Cervical, Uterine, and Ovarian Cancer  Your health care provider may recommend that you be screened regularly for cancer of the pelvic organs. These include your ovaries, uterus, and vagina. This screening involves a pelvic exam, which includes checking for microscopic changes to the surface of your cervix (Pap test).  For women ages 21-65, health care providers may recommend a pelvic exam and a Pap test every three years. For women ages 23-65, they may recommend the Pap test and pelvic exam, combined with testing for human papilloma virus (HPV), every five years. Some types of HPV increase your risk of cervical cancer. Testing for HPV may also be done on women of any age who have unclear Pap test results.  Other health care providers may not recommend any screening for nonpregnant women who are considered low risk for pelvic cancer and have no symptoms. Ask your health care provider if a screening pelvic exam is right for you.  If you have had past treatment for cervical cancer or a condition that could lead to cancer, you need Pap tests and screening for cancer for at least 20 years after your treatment. If Pap tests have been discontinued for you, your risk factors (such as having a new sexual partner) need to be reassessed  to determine if you should start having screenings again. Some women have medical problems that increase the chance of getting cervical cancer. In these cases, your health care provider may recommend that you have screening and Pap tests more often.  If you have a family history of uterine cancer or ovarian cancer, talk with your health care provider about genetic screening.  If you have vaginal bleeding after reaching menopause, tell your health care provider.  There are currently no reliable tests available to screen for ovarian cancer. Lung Cancer  Lung cancer screening is recommended for adults 99-83 years old who are at high risk for lung cancer because of a history of smoking. A yearly low-dose CT scan of the lungs is recommended if you:  Currently smoke.  Have a history of at least 30 pack-years of smoking and you currently smoke or have quit within the past 15 years. A pack-year is smoking an average of one pack of cigarettes per day for one year. Yearly screening should:  Continue until it has been 15 years since you quit.  Stop if you develop a health problem that would prevent you from having lung cancer treatment. Colorectal Cancer  This type of cancer can be detected and can often be prevented.  Routine colorectal cancer screening usually begins at age 72 and continues  through age 75.  If you have risk factors for colon cancer, your health care provider may recommend that you be screened at an earlier age.  If you have a family history of colorectal cancer, talk with your health care provider about genetic screening.  Your health care provider may also recommend using home test kits to check for hidden blood in your stool.  A small camera at the end of a tube can be used to examine your colon directly (sigmoidoscopy or colonoscopy). This is done to check for the earliest forms of colorectal cancer.  Direct examination of the colon should be repeated every 5-10 years until  age 75. However, if early forms of precancerous polyps or small growths are found or if you have a family history or genetic risk for colorectal cancer, you may need to be screened more often. Skin Cancer  Check your skin from head to toe regularly.  Monitor any moles. Be sure to tell your health care provider:  About any new moles or changes in moles, especially if there is a change in a mole's shape or color.  If you have a mole that is larger than the size of a pencil eraser.  If any of your family members has a history of skin cancer, especially at a young age, talk with your health care provider about genetic screening.  Always use sunscreen. Apply sunscreen liberally and repeatedly throughout the day.  Whenever you are outside, protect yourself by wearing long sleeves, pants, a wide-brimmed hat, and sunglasses. What should I know about osteoporosis? Osteoporosis is a condition in which bone destruction happens more quickly than new bone creation. After menopause, you may be at an increased risk for osteoporosis. To help prevent osteoporosis or the bone fractures that can happen because of osteoporosis, the following is recommended:  If you are 19-50 years old, get at least 1,000 mg of calcium and at least 600 mg of vitamin D per day.  If you are older than age 50 but younger than age 70, get at least 1,200 mg of calcium and at least 600 mg of vitamin D per day.  If you are older than age 70, get at least 1,200 mg of calcium and at least 800 mg of vitamin D per day. Smoking and excessive alcohol intake increase the risk of osteoporosis. Eat foods that are rich in calcium and vitamin D, and do weight-bearing exercises several times each week as directed by your health care provider. What should I know about how menopause affects my mental health? Depression may occur at any age, but it is more common as you become older. Common symptoms of depression include:  Low or sad  mood.  Changes in sleep patterns.  Changes in appetite or eating patterns.  Feeling an overall lack of motivation or enjoyment of activities that you previously enjoyed.  Frequent crying spells. Talk with your health care provider if you think that you are experiencing depression. What should I know about immunizations? It is important that you get and maintain your immunizations. These include:  Tetanus, diphtheria, and pertussis (Tdap) booster vaccine.  Influenza every year before the flu season begins.  Pneumonia vaccine.  Shingles vaccine. Your health care provider may also recommend other immunizations. This information is not intended to replace advice given to you by your health care provider. Make sure you discuss any questions you have with your health care provider. Document Released: 10/18/2005 Document Revised: 03/15/2016 Document Reviewed: 05/30/2015 Elsevier Interactive Patient   Education  2017 [employer]  

## 2016-11-20 NOTE — Assessment & Plan Note (Signed)
Irrigation to be done today by Conservation officer, [employer]

## 2016-11-20 NOTE — Progress Notes (Signed)
Bilateral lateral ear irrigation.  Right ear irrigated and wax successfully removed.   Left ear irrigated and wax came out without problem.   Patient tolerated procedure well.

## 2016-11-22 NOTE — Assessment & Plan Note (Signed)
she reports compliance with medication regimen  but has an elevated reading today in office.  sHe has been asked to check his BP at home and  submit readings for evaluation. Renal function will be checked   Lab Results  Component Value Date   CREATININE 0.62 05/23/2016   Lab Results  Component Value Date   NA 133 (L) 05/23/2016   K 4.2 05/23/2016   CL 98 05/23/2016   CO2 30 05/23/2016

## 2016-11-22 NOTE — Assessment & Plan Note (Signed)
Currently controlled. No recent episodes.  No medication changes today

## 2016-11-22 NOTE — Assessment & Plan Note (Signed)
Thyroid function assessment is due  Lab Results  Component Value Date   TSH 1.72 05/23/2016

## 2016-11-22 NOTE — Assessment & Plan Note (Signed)

## 2016-11-22 NOTE — Assessment & Plan Note (Addendum)
Her insomnia is chronic and aggravated  by the recent death of her husband.   she requires use of temazepam to relax enough to fall asleep.  Bedtime habits reviewed.  The risks and benefits of benzodiazepine use were reviewed with patient today including excessive sedation leading to respiratory depression,  impaired thinking/driving, and addiction.  Patient was advised to avoid concurrent use with alcohol, to use medication only as needed and not to share with others  . [name]

## 2016-12-03 DIAGNOSIS — R69 Illness, unspecified: Secondary | ICD-10-CM | POA: Diagnosis not present

## 2016-12-11 ENCOUNTER — Other Ambulatory Visit (INDEPENDENT_AMBULATORY_CARE_PROVIDER_SITE_OTHER): Payer: Medicare HMO

## 2016-12-11 DIAGNOSIS — R5383 Other fatigue: Secondary | ICD-10-CM | POA: Diagnosis not present

## 2016-12-11 DIAGNOSIS — I10 Essential (primary) hypertension: Secondary | ICD-10-CM

## 2016-12-11 DIAGNOSIS — E034 Atrophy of thyroid (acquired): Secondary | ICD-10-CM | POA: Diagnosis not present

## 2016-12-11 DIAGNOSIS — E559 Vitamin D deficiency, unspecified: Secondary | ICD-10-CM

## 2016-12-11 DIAGNOSIS — E7800 Pure hypercholesterolemia, unspecified: Secondary | ICD-10-CM

## 2016-12-11 LAB — CBC WITH DIFFERENTIAL/PLATELET
BASOS PCT: 0.7 % (ref 0.0–3.0)
Basophils Absolute: 0 10*3/uL (ref 0.0–0.1)
Eosinophils Absolute: 0.1 10*3/uL (ref 0.0–0.7)
Eosinophils Relative: 2.5 % (ref 0.0–5.0)
HCT: 43.1 % (ref 36.0–46.0)
Hemoglobin: 14.8 g/dL (ref 12.0–15.0)
LYMPHS ABS: 2 10*3/uL (ref 0.7–4.0)
Lymphocytes Relative: 41 % (ref 12.0–46.0)
MCHC: 34.4 g/dL (ref 30.0–36.0)
MCV: 94.3 fl (ref 78.0–100.0)
MONO ABS: 0.5 10*3/uL (ref 0.1–1.0)
Monocytes Relative: 10.3 % (ref 3.0–12.0)
NEUTROS ABS: 2.2 10*3/uL (ref 1.4–7.7)
Neutrophils Relative %: 45.5 % (ref 43.0–77.0)
PLATELETS: 222 10*3/uL (ref 150.0–400.0)
RBC: 4.57 Mil/uL (ref 3.87–5.11)
RDW: 13.2 % (ref 11.5–15.5)
WBC: 4.8 10*3/uL (ref 4.0–10.5)

## 2016-12-11 LAB — LIPID PANEL
CHOLESTEROL: 279 mg/dL — AB (ref 0–200)
HDL: 97.3 mg/dL (ref >39.00)
LDL CALC: 165 mg/dL — AB (ref 0–99)
NonHDL: 181.32
TRIGLYCERIDES: 82 mg/dL (ref 0.0–149.0)
Total CHOL/HDL Ratio: 3
VLDL: 16.4 mg/dL (ref 0.0–40.0)

## 2016-12-11 LAB — COMPREHENSIVE METABOLIC PANEL
ALT: 20 U/L (ref 0–35)
AST: 22 U/L (ref 0–37)
Albumin: 4.5 g/dL (ref 3.5–5.2)
Alkaline Phosphatase: 53 U/L (ref 39–117)
BUN: 9 mg/dL (ref 6–23)
CALCIUM: 9.5 mg/dL (ref 8.4–10.5)
CHLORIDE: 101 meq/L (ref 96–112)
CO2: 31 meq/L (ref 19–32)
CREATININE: 0.68 mg/dL (ref 0.40–1.20)
GFR: 89.28 mL/min (ref >60.00)
Glucose, Bld: 89 mg/dL (ref 70–99)
Potassium: 4.2 mEq/L (ref 3.5–5.1)
Sodium: 136 mEq/L (ref 135–145)
Total Bilirubin: 0.6 mg/dL (ref 0.2–1.2)
Total Protein: 6.8 g/dL (ref 6.0–8.3)

## 2016-12-11 LAB — TSH: TSH: 3.27 u[IU]/mL (ref 0.35–4.50)

## 2016-12-11 LAB — VITAMIN D 25 HYDROXY (VIT D DEFICIENCY, FRACTURES): VITD: 73.24 ng/mL (ref 30.00–100.00)

## 2016-12-15 ENCOUNTER — Encounter: Payer: Self-pay | Admitting: Internal Medicine

## 2017-01-21 ENCOUNTER — Ambulatory Visit: Payer: Medicare HMO | Admitting: Cardiovascular Disease

## 2017-01-22 NOTE — Progress Notes (Signed)
Cardiology Office Note  Date:  01/24/2017   ID:  Wendy Love, Wendy Love November 26, 1940, MRN [id number]  PCP:  Crecencio Mc, MD   Chief Complaint  Patient presents with  . OTHER    1 yr f/u no complaints today. Meds reviewed verbally with pt.    HPI:   Wendy Love is a very pleasant 76 year old woman with tachycardia, flushing, chest heaviness, started on beta blockers Calcium score of 0 in 2007, minimal plaquing in the aortic arch  significant stress concerning her husband who has cancer who presents for routine followup of her blood pressure  In follow-up today we discussed her recent stressors,  lost her husband January 2018, cancer Still adjusting, doing exercise 3 days a week  doing a lot of his finances diet is not as good, weight trending upwards  Reviewed lab work with her, total cholesterol trending upwards now 270   blood pressure stable on her current medication regiment  EKG  personal he reviewed by myself on today's visit shows normal sinus rhythm with rate 79 bpm, no significant ST or T-wave changes   PMH:   has a past medical history of Anxiety; Bronchitis; Depression; Endometriosis (1974); Hypertension; Hyperthyroidism; Rhinosinusitis; and Sciatica.  PSH:    Past Surgical History:  Procedure Laterality Date  . ABDOMINAL HYSTERECTOMY  2000  . APPENDECTOMY    . CARPAL TUNNEL RELEASE     bilateral  . Endoscopy/colonoscopy  April 2012   Schatski's Ring , small hiatal hernia  . hemilaminectomy  April 2010   L3-L4hemi, L4-L5 microdiskectomy    Current Outpatient Prescriptions  Medication Sig Dispense Refill  . amLODipine (NORVASC) 2.5 MG tablet Take 1 tablet (2.5 mg total) by mouth daily. 90 tablet 1  . b complex vitamins tablet Take 1 tablet by mouth daily.      . Calcium-Magnesium 500-250 MG TABS Take by mouth 2 (two) times daily.      . Cholecalciferol (VITAMIN D3) 2000 UNITS TABS Take 5,000 Units by mouth daily.     [name] levothyroxine (SYNTHROID,  LEVOTHROID) 50 MCG tablet Take 1 tablet (50 mcg total) by mouth daily. 90 tablet 1  . losartan (COZAAR) 50 MG tablet Take 2 tablets (100 mg total) by mouth daily. 180 tablet 2  . Multiple Vitamin (MULTIVITAMIN) tablet Take 1 tablet by mouth daily.    [name] PROBIOTIC CAPS Take by mouth daily.      . temazepam (RESTORIL) 15 MG capsule TAKE 1 CAPSULE BY MOUTH AT BEDTIME AS NEEDED FOR SLEEP 90 capsule 3  . Triamcinolone Acetonide (NASACORT ALLERGY 24HR NA) Place 1 spray into the nose daily as needed.     . vitamin C (ASCORBIC ACID) 500 MG tablet Take 500 mg by mouth daily.    . Zinc 25 MG TABS Take 1 tablet by mouth daily.     No current facility-administered medications for this visit.      Allergies:   Brompheniramine-pseudoeph; Codeine; Diphenhydramine hcl; and Loratadine   Social History:  The patient  reports that she has never smoked. She has never used smokeless tobacco. She reports that she does not drink alcohol or use drugs.   Family History:   family history includes Cancer (age of onset: 98) in her mother; Heart disease in her father; Ovarian cancer in her mother.    Review of Systems: Review of Systems  Constitutional: Negative.   Respiratory: Negative.   Cardiovascular: Negative.   Gastrointestinal: Negative.   Musculoskeletal: Negative.   Neurological: Negative.  Psychiatric/Behavioral: Negative.   All other systems reviewed and are negative.    PHYSICAL EXAM: VS:  BP (!) 144/88 (BP Location: Left Arm, Patient Position: Sitting, Cuff Size: Normal)   Pulse 79   Ht 5' 2.5" (1.588 m)   Wt 131 lb (59.4 kg)   BMI 23.58 kg/m  , BMI Body mass index is 23.58 kg/m.  GEN: Well nourished, well developed, in no acute distress HEENT: normal Neck: no JVD, carotid bruits, or masses Cardiac: RRR; no murmurs, rubs, or gallops,no edema  Respiratory:  clear to auscultation bilaterally, normal work of breathing GI: soft, nontender, nondistended, + BS MS: no deformity or  atrophy Skin: warm and dry, no rash Neuro:  Strength and sensation are intact Psych: euthymic mood, full affect    Recent Labs: 12/11/2016: ALT 20; BUN 9; Creatinine, Ser 0.68; Hemoglobin 14.8; Platelets 222.0; Potassium 4.2; Sodium 136; TSH 3.27    Lipid Panel Lab Results  Component Value Date   CHOL 279 (H) 12/11/2016   HDL 97.30 12/11/2016   LDLCALC 165 (H) 12/11/2016   TRIG 82.0 12/11/2016      Wt Readings from Last 3 Encounters:  01/24/17 131 lb (59.4 kg)  11/20/16 128 lb 3.2 oz (58.2 kg)  05/23/16 123 lb 1.9 oz (55.8 kg)       ASSESSMENT AND PLAN:  Atrial tachycardia, paroxysmal ([place]) - Plan: EKG 12-Lead, CT CARDIAC SCORING She denies any significant tachycardia No further workup at this time  Essential hypertension - Plan: EKG 12-Lead, CT CARDIAC SCORING Blood pressure high end of the range Recommended she monitor blood pressure at home, diet has been poor Suggested she call our office if blood pressure runs high  Pure hypercholesterolemia - Plan: EKG 12-Lead, CT CARDIAC SCORING Long discussion concerning her cholesterol  steady trend upwards over the past several years Previous CT coronary calcium score 2007 with aortic arch plaquing Would leave it up to her but could potentially repeat calcium scoring to help guide management of her hyperlipidemia Currently not interested in a cholesterol medication  Disposition:   F/U  12 months   Orders Placed This Encounter  Procedures  . CT CARDIAC SCORING  . EKG 12-Lead     Signed, Esmond Plants, M.D., Ph.D. 01/24/2017  [place], [place]

## 2017-01-24 ENCOUNTER — Other Ambulatory Visit: Payer: Self-pay | Admitting: Cardiovascular Disease

## 2017-01-24 ENCOUNTER — Ambulatory Visit (INDEPENDENT_AMBULATORY_CARE_PROVIDER_SITE_OTHER): Payer: Medicare HMO | Admitting: Cardiovascular Disease

## 2017-01-24 ENCOUNTER — Encounter: Payer: Self-pay | Admitting: Cardiovascular Disease

## 2017-01-24 VITALS — BP 144/88 | HR 79 | Ht 62.5 in | Wt 131.0 lb

## 2017-01-24 DIAGNOSIS — I10 Essential (primary) hypertension: Secondary | ICD-10-CM

## 2017-01-24 DIAGNOSIS — I471 Supraventricular tachycardia: Secondary | ICD-10-CM | POA: Diagnosis not present

## 2017-01-24 DIAGNOSIS — E7800 Pure hypercholesterolemia, unspecified: Secondary | ICD-10-CM

## 2017-01-24 NOTE — Patient Instructions (Addendum)
Medication Instructions:   No medication changes made  Labwork:  No new labs needed  Testing/Procedures:  We will place an order for CT coronary calcium score For family hx, high cholesterol There is a one-time fee of $150 due at the time of your procedure Please call [phone] to schedule this at your convenience [address] [place], [place] [address], [place]: It was a pleasure seeing you in the office today. Please call us if you have new issues that need to be addressed before your next appt.  [phone]  Your physician wants you to follow-up in: 12 months.  You will receive a reminder letter in the mail two months in advance. If you don't receive a letter, please call our office to schedule the follow-up appointment.  If you need a refill on your cardiac medications before your next appointment, please call your pharmacy.     Coronary Calcium Scan A coronary calcium scan is an imaging test used to look for deposits of calcium and other fatty materials (plaques) in the inner lining of the blood vessels of the heart (coronary arteries). These deposits of calcium and plaques can partly clog and narrow the coronary arteries without producing any symptoms or warning signs. This puts a person at risk for a heart attack. This test can detect these deposits before symptoms develop. Tell a health care provider about:  Any allergies you have.  All medicines you are taking, including vitamins, herbs, eye drops, creams, and over-the-counter medicines.  Any problems you or family members have had with anesthetic medicines.  Any blood disorders you have.  Any surgeries you have had.  Any medical conditions you have.  Whether you are pregnant or may be pregnant. What are the risks? Generally, this is a safe procedure. However, problems may occur, including:  Harm to a pregnant woman and her unborn baby. This test involves the use of radiation. Radiation exposure can  be dangerous to a pregnant woman and her unborn baby. If you are pregnant, you generally should not have this procedure done.  Slight increase in the risk of cancer. This is because of the radiation involved in the test. What happens before the procedure? No preparation is needed for this procedure. What happens during the procedure?  You will undress and remove any jewelry around your neck or chest.  You will put on a hospital gown.  Sticky electrodes will be placed on your chest. The electrodes will be connected to an electrocardiogram (ECG) machine to record a tracing of the electrical activity of your heart.  A CT scanner will take pictures of your heart. During this time, you will be asked to lie still and hold your breath for 2-3 seconds while a picture of your heart is being taken. The procedure may vary among health care providers and hospitals. What happens after the procedure?  You can get dressed.  You can return to your normal activities.  It is up to you to get the results of your test. Ask your health care provider, or the department that is doing the test, when your results will be ready. Summary  A coronary calcium scan is an imaging test used to look for deposits of calcium and other fatty materials (plaques) in the inner lining of the blood vessels of the heart (coronary arteries).  Generally, this is a safe procedure. Tell your health care provider if you are pregnant or may be pregnant.  No preparation is needed for this procedure.  A CT scanner will take pictures of your heart.  You can return to your normal activities after the scan is done. This information is not intended to replace advice given to you by your health care provider. Make sure you discuss any questions you have with your health care provider. Document Released: 02/22/2008 Document Revised: 07/15/2016 Document Reviewed: 07/15/2016 Elsevier Interactive Patient Education  2017 [employer].

## 2017-01-24 NOTE — Progress Notes (Unsigned)
[facility] SAME DAY SURGERY LASIX PROTOCOL  Wendy Love [id number] [birth year] Date orders placed: 01/24/2017   Orders Placed By:______________________________  Dept/Office:____________________________________  Phone Number/beeper:___________________________  Activate Orders on Date:__________________________  Repeat Orders on Additional Days:__________________   Nursing:  Obtain/document: _____Weight, _____BP, _____Heart Rate, _____oxygen level   _____Insert peripheral IV    _____Patient education, give CHF booklet   LABS  ______BMP  ______BNP, brain natriuretic peptide   ______Magnesium      ______CBC  ______Digoxin level    Medications  Metolazone  ____2.5 mg  _____5 mg   oral dose (30 min before lasix IV)   Lasix injection  ____20 ____40 _____60 ____80 ____100 mg push   Electrolytes  Potassium chloride (Kdur) CR tablet, _____20 meq, _____40 Meq  Potassium chloride Liquid, _____20 meq, ______40 Meq  Magnesium replacement, _____1 gm, ______2 gm IV  D/c to home after orders above complete

## 2017-02-12 ENCOUNTER — Other Ambulatory Visit: Payer: Self-pay | Admitting: Internal Medicine

## 2017-02-12 DIAGNOSIS — I10 Essential (primary) hypertension: Secondary | ICD-10-CM

## 2017-03-22 ENCOUNTER — Other Ambulatory Visit: Payer: Self-pay | Admitting: Internal Medicine

## 2017-04-08 ENCOUNTER — Emergency Department
Admission: EM | Admit: 2017-04-08 | Discharge: 2017-04-08 | Disposition: A | Discharge status: Home / Self Care | Payer: Medicare HMO | Attending: Emergency Medicine | Admitting: Emergency Medicine

## 2017-04-08 ENCOUNTER — Encounter: Payer: Self-pay | Admitting: Medical Oncology

## 2017-04-08 DIAGNOSIS — Z79899 Other long term (current) drug therapy: Secondary | ICD-10-CM | POA: Diagnosis not present

## 2017-04-08 DIAGNOSIS — I10 Essential (primary) hypertension: Secondary | ICD-10-CM | POA: Diagnosis not present

## 2017-04-08 DIAGNOSIS — E039 Hypothyroidism, unspecified: Secondary | ICD-10-CM | POA: Insufficient documentation

## 2017-04-08 DIAGNOSIS — R040 Epistaxis: Secondary | ICD-10-CM

## 2017-04-08 HISTORY — DX: Pure hypercholesterolemia, unspecified: E78.00

## 2017-04-08 MED ORDER — SILVER NITRATE-POT NITRATE 75-25 % EX MISC
CUTANEOUS | Status: AC
  Filled 2017-04-08: qty 1

## 2017-04-08 NOTE — ED Triage Notes (Signed)
Pt reports she had mechanical fall this am due to uneven pavement. Patient denies hitting her head. Does have abrasion to the right knee. Patient states after the fall her nose started bleeding. Patient used two sprays of afrin and nose bleeding stopped. About an hour after afrin nose started bleeding again. Patient nose is currently not bleeding. EDP at bedside.

## 2017-04-08 NOTE — ED Provider Notes (Addendum)
[facility] Emergency Department Provider Note    ____________________________________________   I have reviewed the triage vital signs and the nursing notes.   HISTORY  Chief Complaint Epistaxis   History limited by: Not Limited   HPI Wendy Love is a 76 y.o. female who presents to the emergency department today because of concern for nose bleed. The patient states that she had a fall earlier today because of an irregularity in the pavement. The patient denies hitting her head. This afternoon she did have a nosebleed. It was in the left nares. The patient states that EMS came out and were able to stop the bleed. One hour later the bleeding started again. She denies any facial pain. Denies history of nose bleed.   Past Medical History:  Diagnosis Date  . Anxiety   . Bronchitis   . Depression   . Endometriosis 1974   s/p abdominal surgery, 2nd surgery  . High cholesterol   . Hypertension   . Hyperthyroidism   . Rhinosinusitis   . Sciatica     Patient Active Problem List   Diagnosis Date Noted  . Grief reaction 11/20/2016  . Bilateral impacted cerumen 11/16/2013  . Onychomycosis of toenail 11/16/2013  . Tinnitus 11/16/2013  . Medicare annual wellness visit, subsequent 11/30/2012  . Postmenopausal atrophic vaginitis 11/21/2012  . Atrial tachycardia, paroxysmal ([place]) 11/13/2010  . OTHER SYMPTOMS INVOLVING DIGESTIVE SYSTEM OTHER 09/14/2010  . Hyperlipidemia 03/21/2010  . UNEQUAL LEG LENGTH 02/07/2010  . INSOMNIA, CHRONIC 12/19/2009  . TREMOR 12/19/2009  . SCIATICA, RIGHT 09/27/2008  . Hypothyroidism 10/30/2007  . SYMPTOMATIC MENOPAUSAL/FEMALE CLIMACTERIC STATES 10/30/2007  . CARPAL TUNNEL SYNDROME 07/30/2007  . Essential hypertension 07/30/2007  . Osteoarthritis of spine 07/30/2007    Past Surgical History:  Procedure Laterality Date  . ABDOMINAL HYSTERECTOMY  2000  . APPENDECTOMY    . CARPAL TUNNEL RELEASE     bilateral  .  Endoscopy/colonoscopy  April 2012   Schatski's Ring , small hiatal hernia  . hemilaminectomy  April 2010   L3-L4hemi, L4-L5 microdiskectomy    Prior to Admission medications   Medication Sig Start Date End Date Taking? Authorizing Provider  amLODipine (NORVASC) 2.5 MG tablet TAKE 1 TABLET BY MOUTH EVERY DAY 02/12/17   Crecencio Mc, MD  b complex vitamins tablet Take 1 tablet by mouth daily.      [provider]  Calcium-Magnesium 500-250 MG TABS Take by mouth 2 (two) times daily.      [provider]  Cholecalciferol (VITAMIN D3) 2000 UNITS TABS Take 5,000 Units by mouth daily.     [provider]  levothyroxine (SYNTHROID, LEVOTHROID) 50 MCG tablet TAKE 1 TABLET (50 MCG TOTAL) BY MOUTH DAILY. 03/24/17   Crecencio Mc, MD  losartan (COZAAR) 50 MG tablet Take 2 tablets (100 mg total) by mouth daily. 10/23/16   Crecencio Mc, MD  Multiple Vitamin (MULTIVITAMIN) tablet Take 1 tablet by mouth daily.    [provider]  PROBIOTIC CAPS Take by mouth daily.      [provider]  temazepam (RESTORIL) 15 MG capsule TAKE 1 CAPSULE BY MOUTH AT BEDTIME AS NEEDED FOR SLEEP 11/20/16   Crecencio Mc, MD  Triamcinolone Acetonide (NASACORT ALLERGY 24HR NA) Place 1 spray into the nose daily as needed.     [provider]  vitamin C (ASCORBIC ACID) 500 MG tablet Take 500 mg by mouth daily.    [provider]  Zinc 25 MG TABS  Take 1 tablet by mouth daily.    [provider]    Allergies Brompheniramine-pseudoeph; Codeine; Diphenhydramine hcl; and Loratadine  Family History  Problem Relation Age of Onset  . Ovarian cancer Mother   . Cancer Mother 48       ovarian cancer, treated by [facility]  . Heart disease Father     Social History Social History  Substance Use Topics  . Smoking status: Never Smoker  . Smokeless tobacco: Never Used  . Alcohol use No    Review of Systems Constitutional: No fever/chills Eyes: No visual  changes. ENT: No sore throat. Cardiovascular: Denies chest pain. Respiratory: Denies shortness of breath. Gastrointestinal: No abdominal pain.  No nausea, no vomiting.  No diarrhea.   Genitourinary: Negative for dysuria. Musculoskeletal: Negative for back pain. Skin: Negative for rash. Neurological: Negative for headaches, focal weakness or numbness.  ____________________________________________   PHYSICAL EXAM:  VITAL SIGNS: ED Triage Vitals  Enc Vitals Group     BP 04/08/17 1539 (!) 201/100     Pulse Rate 04/08/17 1539 95     Resp 04/08/17 1539 18     Temp 04/08/17 1539 98.7 F (37.1 C)     Temp Source 04/08/17 1539 Oral     SpO2 04/08/17 1539 97 %     Weight 04/08/17 1540 131 lb (59.4 kg)     Height --      Head Circumference --      Peak Flow --      Pain Score 04/08/17 1539 0   Constitutional: Alert and oriented. Well appearing and in no distress. Eyes: Conjunctivae are normal.  ENT   Head: Normocephalic and atraumatic.   Nose: No congestion/rhinnorhea.   Mouth/Throat: Mucous membranes are moist.   Neck: No stridor. Cardiovascular: Regular rate and rhythm. Respiratory: Normal respiratory effort without tachypnea nor retractions.  Genitourinary: Deferred Musculoskeletal: Normal range of motion in all extremities.  Neurologic:  Normal speech and language. No gross focal neurologic deficits are appreciated.  Skin:  Skin is warm, dry and intact. No rash noted. Psychiatric: Mood and affect are normal. Speech and behavior are normal. Patient exhibits appropriate insight and judgment.  ____________________________________________    LABS (pertinent positives/negatives)  Labs Reviewed - No data to display   ____________________________________________   EKG  None  ____________________________________________     RADIOLOGY  None  ____________________________________________   PROCEDURES  Procedures  ____________________________________________   INITIAL IMPRESSION / ASSESSMENT AND PLAN / ED COURSE  Pertinent labs & imaging results that were available during my care of the patient were reviewed by me and considered in my medical decision making (see chart for details).  Patient presents with nosebleed. On exam slight bleeding to septum in left nares. This was cauterized. Patient was observed without further bleeding. Discussed care with patient. Will give ENT follow up information.  ____________________________________________   FINAL CLINICAL IMPRESSION(S) / ED DIAGNOSES  Final diagnoses:  Epistaxis     Note: This dictation was prepared with Dragon dictation. Any transcriptional errors that result from this process are unintentional     Nance Pear, MD 04/08/17 Erskin Burnet    Nance Pear, MD 04/08/17 [phone]

## 2017-04-08 NOTE — Discharge Instructions (Signed)
Please seek medical attention for any high fevers, chest pain, shortness of breath, change in behavior, persistent vomiting, bloody stool or any other new or concerning symptoms.  

## 2017-04-08 NOTE — ED Notes (Signed)
Patient verbalizes understanding of d/c instructions and follow-up. VS stable and pain controlled per patient.  Patient in NAD at time of d/c and denies further concerns regarding this visit. Patient stable at the time of departure from the unit, departing unit by the safest and most appropriate manner per that patients condition and limitations. Patient advised to return to the ED at any time for emergent concerns, or for new/worsening symptoms.    Patient refused wheel chair out  

## 2017-04-18 DIAGNOSIS — H40003 Preglaucoma, unspecified, bilateral: Secondary | ICD-10-CM | POA: Diagnosis not present

## 2017-05-12 ENCOUNTER — Other Ambulatory Visit: Payer: Self-pay | Admitting: Internal Medicine

## 2017-05-12 DIAGNOSIS — I10 Essential (primary) hypertension: Secondary | ICD-10-CM

## 2017-05-22 DIAGNOSIS — L820 Inflamed seborrheic keratosis: Secondary | ICD-10-CM | POA: Diagnosis not present

## 2017-05-22 DIAGNOSIS — D2271 Melanocytic nevi of right lower limb, including hip: Secondary | ICD-10-CM | POA: Diagnosis not present

## 2017-05-22 DIAGNOSIS — L821 Other seborrheic keratosis: Secondary | ICD-10-CM | POA: Diagnosis not present

## 2017-05-22 DIAGNOSIS — L538 Other specified erythematous conditions: Secondary | ICD-10-CM | POA: Diagnosis not present

## 2017-05-22 DIAGNOSIS — D2261 Melanocytic nevi of right upper limb, including shoulder: Secondary | ICD-10-CM | POA: Diagnosis not present

## 2017-05-22 DIAGNOSIS — D225 Melanocytic nevi of trunk: Secondary | ICD-10-CM | POA: Diagnosis not present

## 2017-05-23 ENCOUNTER — Ambulatory Visit: Payer: Medicare HMO | Admitting: Internal Medicine

## 2017-06-30 ENCOUNTER — Other Ambulatory Visit: Payer: Self-pay | Admitting: Internal Medicine

## 2017-07-01 ENCOUNTER — Encounter: Payer: Self-pay | Admitting: Internal Medicine

## 2017-07-01 ENCOUNTER — Ambulatory Visit (INDEPENDENT_AMBULATORY_CARE_PROVIDER_SITE_OTHER): Payer: Medicare HMO | Admitting: Internal Medicine

## 2017-07-01 VITALS — BP 140/84 | HR 72 | Temp 98.1°F | Resp 14 | Ht 62.5 in | Wt 131.4 lb

## 2017-07-01 DIAGNOSIS — F4321 Adjustment disorder with depressed mood: Secondary | ICD-10-CM | POA: Diagnosis not present

## 2017-07-01 DIAGNOSIS — E039 Hypothyroidism, unspecified: Secondary | ICD-10-CM | POA: Diagnosis not present

## 2017-07-01 DIAGNOSIS — I10 Essential (primary) hypertension: Secondary | ICD-10-CM

## 2017-07-01 DIAGNOSIS — I739 Peripheral vascular disease, unspecified: Secondary | ICD-10-CM | POA: Diagnosis not present

## 2017-07-01 DIAGNOSIS — G4700 Insomnia, unspecified: Secondary | ICD-10-CM

## 2017-07-01 DIAGNOSIS — R69 Illness, unspecified: Secondary | ICD-10-CM | POA: Diagnosis not present

## 2017-07-01 DIAGNOSIS — E7800 Pure hypercholesterolemia, unspecified: Secondary | ICD-10-CM | POA: Diagnosis not present

## 2017-07-01 NOTE — Progress Notes (Signed)
Subjective:  Patient ID: Wendy Love, female    DOB: [birth year]  Age: 76 y.o. MRN: [id number]  CC: The primary encounter diagnosis was Claudication [facility]). Diagnoses of Pure hypercholesterolemia, Acquired hypothyroidism, Essential hypertension, Grief reaction, INSOMNIA, CHRONIC, and Claudication of both lower extremities ([place]) were also pertinent to this visit.  HPI Wendy Love presents for follow up on hypertension,  hypothyroidsm and grief following the loss of  Ed her  Husband to metastatic bladder cancer  In January .    Seen in ED for nosebleed  on July 31  Involving the  left nare,  Cauterized.  None since   Gradually reducing her use of temazepam.  [facility]  Preparing to go to [place] in January .  Having claudication  symptoms in calves after 15 minutes of walking which improves within a minute of rest. Does not occur during dance classes lasting 2 hours   CT coronary calcium  Scoring discussed  Right ear cerumen impaction suspected by patient.     Outpatient Medications Prior to Visit  Medication Sig Dispense Refill  . amLODipine (NORVASC) 2.5 MG tablet TAKE 1 TABLET BY MOUTH EVERY DAY 90 tablet 0  . b complex vitamins tablet Take 1 tablet by mouth daily.      . Calcium-Magnesium 500-250 MG TABS Take by mouth 2 (two) times daily.      . Cholecalciferol (VITAMIN D3) 2000 UNITS TABS Take 5,000 Units by mouth daily.     [name] levothyroxine (SYNTHROID, LEVOTHROID) 50 MCG tablet TAKE 1 TABLET (50 MCG TOTAL) BY MOUTH DAILY. 90 tablet 1  . losartan (COZAAR) 50 MG tablet Take 2 tablets (100 mg total) by mouth daily. 180 tablet 2  . Multiple Vitamin (MULTIVITAMIN) tablet Take 1 tablet by mouth daily.    [name] PROBIOTIC CAPS Take by mouth daily.      . temazepam (RESTORIL) 15 MG capsule TAKE 1 CAPSULE AT BEDTIME 90 capsule 1  . Triamcinolone Acetonide (NASACORT ALLERGY 24HR NA) Place 1 spray into the nose daily as needed.     . vitamin C (ASCORBIC ACID) 500 MG  tablet Take 500 mg by mouth daily.    . Zinc 25 MG TABS Take 1 tablet by mouth daily.     No facility-administered medications prior to visit.     Review of Systems;  Patient denies headache, fevers, malaise, unintentional weight loss, skin rash, eye pain, sinus congestion and sinus pain, sore throat, dysphagia,  hemoptysis , cough, dyspnea, wheezing, chest pain, palpitations, orthopnea, edema, abdominal pain, nausea, melena, diarrhea, constipation, flank pain, dysuria, hematuria, urinary  Frequency, nocturia, numbness, tingling, seizures,  Focal weakness, Loss of consciousness,  Tremor, insomnia, depression, anxiety, and suicidal ideation.      Objective:  BP 140/84 (BP Location: Left Arm, Patient Position: Sitting, Cuff Size: Normal)   Pulse 72   Temp 98.1 F (36.7 C) (Oral)   Resp 14   Ht 5' 2.5" (1.588 m)   Wt 131 lb 6.4 oz (59.6 kg)   SpO2 97%   BMI 23.65 kg/m   BP Readings from Last 3 Encounters:  07/01/17 140/84  04/08/17 (!) 168/81  01/24/17 (!) 144/88    Wt Readings from Last 3 Encounters:  07/01/17 131 lb 6.4 oz (59.6 kg)  04/08/17 131 lb (59.4 kg)  01/24/17 131 lb (59.4 kg)    General appearance: alert, cooperative and appears stated age Ears: normal TM's and external ear canals both ears Throat: lips, mucosa, and tongue  normal; teeth and gums normal Neck: no adenopathy, no carotid bruit, supple, symmetrical, trachea midline and thyroid not enlarged, symmetric, no tenderness/mass/nodules Back: symmetric, no curvature. ROM normal. No CVA tenderness. Lungs: clear to auscultation bilaterally Heart: regular rate and rhythm, S1, S2 normal, no murmur, click, rub or gallop Abdomen: soft, non-tender; bowel sounds normal; no masses,  no organomegaly Pulses: 2+ and symmetric Skin: Skin color, texture, turgor normal. No rashes or lesions Lymph nodes: Cervical, supraclavicular, and axillary nodes normal.  Lab Results  Component Value Date   HGBA1C 5.1 07/04/2015    HGBA1C 5.0 06/01/2014   HGBA1C 5.5 10/16/2010    Lab Results  Component Value Date   CREATININE 0.68 12/11/2016   CREATININE 0.62 05/23/2016   CREATININE 0.66 12/05/2015    Lab Results  Component Value Date   WBC 4.8 12/11/2016   HGB 14.8 12/11/2016   HCT 43.1 12/11/2016   PLT 222.0 12/11/2016   GLUCOSE 89 12/11/2016   CHOL 279 (H) 12/11/2016   TRIG 82.0 12/11/2016   HDL 97.30 12/11/2016   LDLDIRECT 126.6 12/22/2012   LDLCALC 165 (H) 12/11/2016   ALT 20 12/11/2016   AST 22 12/11/2016   NA 136 12/11/2016   K 4.2 12/11/2016   CL 101 12/11/2016   CREATININE 0.68 12/11/2016   BUN 9 12/11/2016   CO2 31 12/11/2016   TSH 3.27 12/11/2016   HGBA1C 5.1 07/04/2015   MICROALBUR 0.2 11/26/2013    No results found.  Assessment & Plan:   Problem List Items Addressed This Visit    Claudication of both lower extremities ([place])    Suspected by history of leg ain with use of treadmill that resolves with rest.  Sending to cardiology for ABI's      Essential hypertension    Well controlled on current regimen. Renal function is due , no changes today. Lab Results  Component Value Date   CREATININE 0.68 12/11/2016   Lab Results  Component Value Date   NA 136 12/11/2016   K 4.2 12/11/2016   CL 101 12/11/2016   CO2 31 12/11/2016         Relevant Orders   Comprehensive metabolic panel   Grief reaction    She has adjusted well to the loss of Ed,  Helped by a 13 week grief counselling program called "griefshare" at [employer]       Hyperlipidemia   Relevant Orders   Lipid panel   Hypothyroidism    Thyroid function assessment is due  Lab Results  Component Value Date   TSH 3.27 12/11/2016         Relevant Orders   TSH   INSOMNIA, CHRONIC    Her insomnia is chronic and aggravated  by the recent death of her husband.   she has been weaning her daily dose of  temazepamp.  Bedtime habits reviewed.  The risks and benefits of benzodiazepine use were reviewed with  patient today including excessive sedation leading to respiratory depression,  impaired thinking/driving, and addiction.  Patient was advised to avoid concurrent use with alcohol, to use medication only as needed and not to share with others  . [name]        Other Visit Diagnoses    Claudication [facility])    -  Primary   Relevant Orders   Ambulatory referral to Cardiology      I have discontinued Ms. O'Reilly's Zinc. I am also having her maintain her b complex vitamins, Vitamin D3, Calcium-Magnesium, Probiotic, multivitamin, Triamcinolone  Acetonide (NASACORT ALLERGY 24HR NA), vitamin C, losartan, levothyroxine, amLODipine, and temazepam.  No orders of the defined types were placed in this encounter.   Medications Discontinued During This Encounter  Medication Reason  . Zinc 25 MG TABS Patient has not taken in last 30 days    Follow-up: Return in about 6 months (around 12/30/2017).   Crecencio Mc, MD

## 2017-07-03 DIAGNOSIS — I739 Peripheral vascular disease, unspecified: Secondary | ICD-10-CM | POA: Insufficient documentation

## 2017-07-03 NOTE — Assessment & Plan Note (Signed)
Well controlled on current regimen. Renal function is due , no changes today. Lab Results  Component Value Date   CREATININE 0.68 12/11/2016   Lab Results  Component Value Date   NA 136 12/11/2016   K 4.2 12/11/2016   CL 101 12/11/2016   CO2 31 12/11/2016

## 2017-07-03 NOTE — Assessment & Plan Note (Signed)
Suspected by history of leg ain with use of treadmill that resolves with rest.  Sending to cardiology for ABI's

## 2017-07-03 NOTE — Assessment & Plan Note (Signed)
Thyroid function assessment is due  Lab Results  Component Value Date   TSH 3.27 12/11/2016

## 2017-07-03 NOTE — Assessment & Plan Note (Signed)
She has adjusted well to the loss of Ed,  Helped by a 13 week grief counselling program called "griefshare" at [employer]

## 2017-07-03 NOTE — Assessment & Plan Note (Signed)
Her insomnia is chronic and aggravated  by the recent death of her husband.   she has been weaning her daily dose of  temazepamp.  Bedtime habits reviewed.  The risks and benefits of benzodiazepine use were reviewed with patient today including excessive sedation leading to respiratory depression,  impaired thinking/driving, and addiction.  Patient was advised to avoid concurrent use with alcohol, to use medication only as needed and not to share with others  . [name]

## 2017-07-15 ENCOUNTER — Telehealth: Payer: Self-pay | Admitting: Internal Medicine

## 2017-07-15 DIAGNOSIS — I739 Peripheral vascular disease, unspecified: Secondary | ICD-10-CM

## 2017-07-15 NOTE — Telephone Encounter (Signed)
LMTCB

## 2017-07-15 NOTE — Telephone Encounter (Signed)
Pt is scheduled with Dr. Rockey Situ on 11/30. Pt lvm stating that she was suppose to have a test done on her legs at Dr. Gwenyth Ober office, but the staff at his office said that they can not schedule a test until an order has been entered. I do not see an order. Please advise.

## 2017-07-18 ENCOUNTER — Other Ambulatory Visit (INDEPENDENT_AMBULATORY_CARE_PROVIDER_SITE_OTHER): Payer: Medicare HMO

## 2017-07-18 ENCOUNTER — Telehealth: Payer: Self-pay | Admitting: Cardiovascular Disease

## 2017-07-18 DIAGNOSIS — E7800 Pure hypercholesterolemia, unspecified: Secondary | ICD-10-CM

## 2017-07-18 DIAGNOSIS — I10 Essential (primary) hypertension: Secondary | ICD-10-CM | POA: Diagnosis not present

## 2017-07-18 DIAGNOSIS — E039 Hypothyroidism, unspecified: Secondary | ICD-10-CM | POA: Diagnosis not present

## 2017-07-18 DIAGNOSIS — I739 Peripheral vascular disease, unspecified: Secondary | ICD-10-CM

## 2017-07-18 DIAGNOSIS — I872 Venous insufficiency (chronic) (peripheral): Secondary | ICD-10-CM

## 2017-07-18 LAB — COMPREHENSIVE METABOLIC PANEL
ALK PHOS: 49 U/L (ref 39–117)
ALT: 19 U/L (ref 0–35)
AST: 24 U/L (ref 0–37)
Albumin: 4.3 g/dL (ref 3.5–5.2)
BUN: 10 mg/dL (ref 6–23)
CHLORIDE: 102 meq/L (ref 96–112)
CO2: 30 meq/L (ref 19–32)
Calcium: 9.6 mg/dL (ref 8.4–10.5)
Creatinine, Ser: 0.67 mg/dL (ref 0.40–1.20)
GFR: 90.68 mL/min (ref >60.00)
GLUCOSE: 91 mg/dL (ref 70–99)
Potassium: 4.1 mEq/L (ref 3.5–5.1)
SODIUM: 137 meq/L (ref 135–145)
TOTAL PROTEIN: 6.9 g/dL (ref 6.0–8.3)
Total Bilirubin: 0.8 mg/dL (ref 0.2–1.2)

## 2017-07-18 LAB — LIPID PANEL
CHOL/HDL RATIO: 3
Cholesterol: 265 mg/dL — ABNORMAL HIGH (ref 0–200)
HDL: 98.2 mg/dL (ref >39.00)
LDL CALC: 152 mg/dL — AB (ref 0–99)
NONHDL: 166.67
TRIGLYCERIDES: 73 mg/dL (ref 0.0–149.0)
VLDL: 14.6 mg/dL (ref 0.0–40.0)

## 2017-07-18 LAB — TSH: TSH: 3.17 u[IU]/mL (ref 0.35–4.50)

## 2017-07-18 NOTE — Telephone Encounter (Signed)
Patient calling to question about test that needs to be scheduled on legs, states it's on the circulation from knee down Please call to discuss

## 2017-07-18 NOTE — Telephone Encounter (Signed)
Spoke with patient and her PCP referred her to [place] for evaluation of claudication. She would like to see if we can order any testing or have a sooner appointment. Advised that she has referral in place and we have her scheduled for 08/08/17. Reviewed with her that when she comes in Dr. Rockey Situ will review to see what testing is needed and we will help her with getting that scheduled. She verbalized understanding of our conversation, agreement with plan, and had no further questions at this time.

## 2017-07-20 ENCOUNTER — Encounter: Payer: Self-pay | Admitting: Internal Medicine

## 2017-07-20 NOTE — Telephone Encounter (Signed)
Okay to schedule lower extremity arterial and ABI if primary care has requested

## 2017-07-21 NOTE — Telephone Encounter (Signed)
Spoke with patient and let her know that I entered order for testing and that someone from scheduling would give her a call to get that set up. She was appreciative for the call back with no further questions at this time.

## 2017-07-23 ENCOUNTER — Ambulatory Visit (INDEPENDENT_AMBULATORY_CARE_PROVIDER_SITE_OTHER): Payer: Medicare HMO

## 2017-07-23 DIAGNOSIS — I739 Peripheral vascular disease, unspecified: Secondary | ICD-10-CM

## 2017-07-23 NOTE — Telephone Encounter (Signed)
After reviewing the last office note with you I see where you referred her to Dr. Rockey Situ to have some ABIs performed. Orders are not in the chart and cardiology stated that they can not schedule a test until an order has been placed. Please advise.

## 2017-07-24 NOTE — Telephone Encounter (Signed)
ABIs ordered. 

## 2017-07-25 LAB — VAS US LOWER EXTREMITY ARTERIAL DUPLEX
LATIBMIDSYS: -110 cm/s
LEFT POPLITEAL DIST DYS VEL: 0 cm/s
LEFT POPLITEAL PROX DYS VEL: 0 cm/s
LEFT SFA PROX DYS VEL: 18 cm/s
LPOPDPSV: -76 cm/s
LPOPPPSV: 84 cm/s
LPTIBMIDSYS: 62 cm/s
LSFDPSV: -94 cm/s
LSFPPSV: 460 cm/s
Left ant tibial mid sys: -2 cm/s
Left post tibial mid dia: 0 cm/s
Left super femoral mid sys PSV: -125 cm/s
RATIBMIDSYS: -65 cm/s
RIGHT ANT MID TIBIAL DIA PSV: 0 cm/s
RIGHT POPLITEAL PROX EDV: 2 cm/s
RIGHT SUPER FEMORAL PROX EDV: 0 cm/s
RPEROMIDSYS: -235 cm/s
RTIBMIDDIA: 0 cm/s
RTIBMIDSYS: 54 cm/s
RTSFADISTDIA: 0 cm/s
RTSFAMIDDIA: 0 cm/s
Right popliteal prox sys PSV: 117 cm/s
Right super femoral dist sys PSV: -84 cm/s
Right super femoral mid sys PSV: -100 cm/s
Right super femoral prox sys PSV: -109 cm/s

## 2017-07-30 NOTE — Telephone Encounter (Addendum)
Spoke with patient and reviewed results and recommendations. Provided her with phone number to scheduling to schedule testing and instructed her to call back if there are any problems. She was appreciative for the call and had no further questions at this time.

## 2017-07-30 NOTE — Telephone Encounter (Signed)
Patient returning call re: Vasc u/s results please call

## 2017-07-30 NOTE — Addendum Note (Signed)
Addended by: Valora Corporal on: 07/30/2017 04:03 PM   Modules accepted: Orders

## 2017-08-07 ENCOUNTER — Telehealth: Payer: Self-pay | Admitting: Cardiovascular Disease

## 2017-08-07 NOTE — Telephone Encounter (Signed)
Spoke with patient and she states that she is scheduled to have her CT done tomorrow and wanted to know if we wanted her to still come tomorrow for appointment with Dr. Rockey Situ. Advised to keep scheduled appointment and that I would check with Dr. Rockey Situ regarding appointment and call her back if he would like to reschedule. She is scheduled for CT at 11:00 AM and then with Dr. Rockey Situ at 2:40 PM. She was appreciative for the call back and had no further questions at this time.

## 2017-08-07 NOTE — Progress Notes (Signed)
Cardiology Office Note  Date:  08/08/2017   ID:  Wendy Love, Wendy Love 05-15-1941, MRN [id number]  PCP:  Crecencio Mc, MD   Chief Complaint  Patient presents with  . other    Follow up per Dr. Derrel Nip regarding claudication. Pt. had a CT Angio AO BIFEM and ABI LE arterial and would like the results. Pt. c/o calf pain in legs after walking for 10 minutes. "doing well."     HPI:  Wendy Love is a very pleasant 76 year old woman with tachycardia, flushing, chest heaviness, started on beta blockers Calcium score of 0 in 2007, minimal plaquing in the aortic arch  significant stress concerning her husband who has cancer Aortic atherosclerosis on CT scan, anterior tibial bilateral disease who presents for routine followup of her blood pressure, PAD, hyperlipidemia  Claudication B/L after walking quickly 10 to 12 min Does exercise class , no sx  Lower extremity duplex concerning for stenosis left ostial SFA and right popliteal  This was not confirmed on CTA with runoff CAT scan did confirm bilateral anterior tibial disease Images pulled up in the office and reviewed with her in detail Also noted was diffuse aortic atherosclerosis, mild to moderate in nature  Long discussion concerning her cholesterol, 260 Previously did not want cholesterol medication   PMH:   has a past medical history of Anxiety, Bronchitis, Depression, Endometriosis (1974), High cholesterol, Hypertension, Hyperthyroidism, Rhinosinusitis, and Sciatica.  PSH:    Past Surgical History:  Procedure Laterality Date  . ABDOMINAL HYSTERECTOMY  2000  . APPENDECTOMY    . CARPAL TUNNEL RELEASE     bilateral  . Endoscopy/colonoscopy  April 2012   Schatski's Ring , small hiatal hernia  . hemilaminectomy  April 2010   L3-L4hemi, L4-L5 microdiskectomy    Current Outpatient Medications  Medication Sig Dispense Refill  . amLODipine (NORVASC) 2.5 MG tablet TAKE 1 TABLET BY MOUTH EVERY DAY 90 tablet 0  . b complex  vitamins tablet Take 1 tablet by mouth daily.      . Calcium-Magnesium 500-250 MG TABS Take by mouth 2 (two) times daily.      . Cholecalciferol (VITAMIN D3) 2000 UNITS TABS Take 5,000 Units by mouth daily.     [name] levothyroxine (SYNTHROID, LEVOTHROID) 50 MCG tablet TAKE 1 TABLET (50 MCG TOTAL) BY MOUTH DAILY. 90 tablet 1  . losartan (COZAAR) 50 MG tablet Take 2 tablets (100 mg total) by mouth daily. 180 tablet 2  . Multiple Vitamin (MULTIVITAMIN) tablet Take 1 tablet by mouth daily.    [name] PROBIOTIC CAPS Take by mouth daily.      . temazepam (RESTORIL) 15 MG capsule TAKE 1 CAPSULE AT BEDTIME 90 capsule 1  . Triamcinolone Acetonide (NASACORT ALLERGY 24HR NA) Place 1 spray into the nose daily as needed.     . vitamin C (ASCORBIC ACID) 500 MG tablet Take 500 mg by mouth daily.    [name] aspirin EC 81 MG tablet Take 1 tablet (81 mg total) by mouth daily.  3  . atorvastatin (LIPITOR) 20 MG tablet Take 1 tablet (20 mg total) by mouth daily. 30 tablet 11   No current facility-administered medications for this visit.      Allergies:   Brompheniramine-pseudoeph; Codeine; Diphenhydramine hcl; and Loratadine   Social History:  The patient  reports that  has never smoked. she has never used smokeless tobacco. She reports that she does not drink alcohol or use drugs.   Family History:   family history includes Cancer (age  of onset: 27) in her mother; Heart disease in her father; Ovarian cancer in her mother.    Review of Systems: Review of Systems  Constitutional: Negative.   Respiratory: Negative.   Cardiovascular: Positive for claudication.  Gastrointestinal: Negative.   Musculoskeletal: Negative.   Neurological: Negative.   Psychiatric/Behavioral: Negative.   All other systems reviewed and are negative.    PHYSICAL EXAM: VS:  BP 128/82 (BP Location: Left Arm, Patient Position: Sitting, Cuff Size: Normal)   Pulse 80   Ht 5' 2.5" (1.588 m)   Wt 130 lb 4 oz (59.1 kg)   BMI 23.44 kg/m  , BMI  Body mass index is 23.44 kg/m.  GEN: Well nourished, well developed, in no acute distress  HEENT: normal  Neck: no JVD, carotid bruits, or masses Cardiac: RRR; no murmurs, rubs, or gallops,no edema  Respiratory:  clear to auscultation bilaterally, normal work of breathing GI: soft, nontender, nondistended, + BS MS: no deformity or atrophy  Skin: warm and dry, no rash Neuro:  Strength and sensation are intact Psych: euthymic mood, full affect    Recent Labs: 12/11/2016: Hemoglobin 14.8; Platelets 222.0 07/18/2017: ALT 19; BUN 10; Creatinine, Ser 0.67; Potassium 4.1; Sodium 137; TSH 3.17    Lipid Panel Lab Results  Component Value Date   CHOL 265 (H) 07/18/2017   HDL 98.20 07/18/2017   LDLCALC 152 (H) 07/18/2017   TRIG 73.0 07/18/2017      Wt Readings from Last 3 Encounters:  08/08/17 130 lb 4 oz (59.1 kg)  07/01/17 131 lb 6.4 oz (59.6 kg)  04/08/17 131 lb (59.4 kg)       ASSESSMENT AND PLAN:  Atrial tachycardia, paroxysmal (HCC) - Plan: EKG 12-Lead, CT CARDIAC SCORING She denies any significant tachycardia Stable  Essential hypertension - Plan: EKG 12-Lead, CT CARDIAC SCORING Blood pressure is well controlled on today's visit. No changes made to the medications.  Pure hypercholesterolemia - Plan: EKG 12-Lead, CT CARDIAC SCORING Long discussion After discussing various treatment options she is willing to try low-dose statin She will start Lipitor 20 mg 1/2 pill daily for several weeks then increase up to whole pill Repeat liver lipid in 3 months time  PAD Aortic atherosclerosis tibial disease on CT scan She is having mild claudication type symptoms Recommend she start baby dose aspirin and Lipitor as above Discussed goal LDL less than 70 We may need to add Zetia in follow-up  Disposition:   F/U  12 months   Total encounter time more than 25 minutes  Greater than 50% was spent in counseling and coordination of care with the patient    No orders of the  defined types were placed in this encounter.    Signed, Esmond Plants, M.D., Ph.D. 08/08/2017  [place], [place]

## 2017-08-07 NOTE — Telephone Encounter (Signed)
Pt calling asking if she needs to come see Dr Rockey Situ tomorrow  For she is doing CT in the morning and is coming that afternoon  She is worried we may not have results by the time she comes in to see Dr Rockey Situ   Please call back and let patient know.

## 2017-08-08 ENCOUNTER — Encounter: Payer: Self-pay | Admitting: Cardiovascular Disease

## 2017-08-08 ENCOUNTER — Ambulatory Visit: Payer: Medicare HMO | Admitting: Cardiovascular Disease

## 2017-08-08 ENCOUNTER — Ambulatory Visit
Admission: RE | Admit: 2017-08-08 | Discharge: 2017-08-08 | Disposition: A | Discharge status: Home / Self Care | Payer: Medicare HMO | Source: Ambulatory Visit | Attending: Cardiovascular Disease | Admitting: Cardiovascular Disease

## 2017-08-08 VITALS — BP 128/82 | HR 80 | Ht 62.5 in | Wt 130.2 lb

## 2017-08-08 DIAGNOSIS — I872 Venous insufficiency (chronic) (peripheral): Secondary | ICD-10-CM | POA: Insufficient documentation

## 2017-08-08 DIAGNOSIS — E7800 Pure hypercholesterolemia, unspecified: Secondary | ICD-10-CM

## 2017-08-08 DIAGNOSIS — I10 Essential (primary) hypertension: Secondary | ICD-10-CM | POA: Diagnosis not present

## 2017-08-08 DIAGNOSIS — I739 Peripheral vascular disease, unspecified: Secondary | ICD-10-CM | POA: Diagnosis not present

## 2017-08-08 DIAGNOSIS — M79669 Pain in unspecified lower leg: Secondary | ICD-10-CM | POA: Diagnosis not present

## 2017-08-08 DIAGNOSIS — M4186 Other forms of scoliosis, lumbar region: Secondary | ICD-10-CM | POA: Diagnosis not present

## 2017-08-08 DIAGNOSIS — I471 Supraventricular tachycardia: Secondary | ICD-10-CM

## 2017-08-08 DIAGNOSIS — M5136 Other intervertebral disc degeneration, lumbar region: Secondary | ICD-10-CM | POA: Insufficient documentation

## 2017-08-08 MED ORDER — ATORVASTATIN CALCIUM 20 MG PO TABS: 20.0000 mg | ORAL_TABLET | Freq: Every day | ORAL | 11 refills | Status: DC

## 2017-08-08 MED ORDER — IOPAMIDOL (ISOVUE-370) INJECTION 76%
100.0000 mL | Freq: Once | INTRAVENOUS | Status: AC | PRN
  Administered 2017-08-08: 100 mL via INTRAVENOUS

## 2017-08-08 NOTE — Patient Instructions (Addendum)
Medication Instructions:   Please start lipitor 1/2 pill for a few weeks Then slowly increase up to a full pill  Baby aspirin 81 mg daily  Labwork:  Liver and  lipids in 3 months  Testing/Procedures:  No further testing at this time   Follow-Up: It was a pleasure seeing you in the office today. Please call us if you have new issues that need to be addressed before your next appt.  [phone]  Your physician wants you to follow-up in: 12 months.  You will receive a reminder letter in the mail two months in advance. If you don't receive a letter, please call our office to schedule the follow-up appointment.  If you need a refill on your cardiac medications before your next appointment, please call your pharmacy.

## 2017-08-11 ENCOUNTER — Other Ambulatory Visit: Payer: Self-pay | Admitting: Internal Medicine

## 2017-08-11 DIAGNOSIS — I10 Essential (primary) hypertension: Secondary | ICD-10-CM

## 2017-08-14 DIAGNOSIS — H43812 Vitreous degeneration, left eye: Secondary | ICD-10-CM | POA: Diagnosis not present

## 2017-09-05 ENCOUNTER — Telehealth: Payer: Self-pay | Admitting: Internal Medicine

## 2017-09-05 MED ORDER — PREDNISONE 10 MG PO TABS: ORAL_TABLET | ORAL | 0 refills | Status: DC

## 2017-09-05 MED ORDER — LEVOFLOXACIN 500 MG PO TABS: 500.0000 mg | ORAL_TABLET | Freq: Every day | ORAL | 0 refills | Status: DC

## 2017-09-05 NOTE — Telephone Encounter (Signed)
Please advise 

## 2017-09-05 NOTE — Telephone Encounter (Signed)
Copied from [place] [id number]. Topic: Quick Communication - See Telephone Encounter >> Sep 05, 2017  2:18 PM Vernona Rieger wrote: CRM for notification. See Telephone encounter for:   09/05/17.  Pt said she Told Dr. Derrel Nip back in October that she was going to [place] for 2 weeks & she indicated that she would give her scripts to give her to take. Please call pt back if she can still do this. She leaves on the 17th. No appts until 10/10/[phone]. [facility]

## 2017-09-05 NOTE — Telephone Encounter (Signed)
My chart message sent:  Once daily levaquin and a prednisone taper [facility] for trip to [place] .  levaquin and prednisone will cover pneumonia and sinusitis,  And levaquin alone with  Cover a  UTI

## 2017-09-06 ENCOUNTER — Encounter: Payer: Self-pay | Admitting: Internal Medicine

## 2017-09-08 MED ORDER — PREDNISONE 10 MG PO TABS: ORAL_TABLET | ORAL | 0 refills | Status: DC

## 2017-09-08 MED ORDER — LEVOFLOXACIN 500 MG PO TABS
500.0000 mg | ORAL_TABLET | Freq: Every day | ORAL | 0 refills | Status: DC
Start: 2017-09-08 — End: 2017-10-21

## 2017-09-08 NOTE — Telephone Encounter (Signed)
The levaquin and the prednisone were resent due to being sent to wrong pharmacy. Sent my chart message to pt.

## 2017-09-16 ENCOUNTER — Other Ambulatory Visit: Payer: Self-pay

## 2017-09-16 MED ORDER — LEVOTHYROXINE SODIUM 50 MCG PO TABS: 50.0000 ug | ORAL_TABLET | Freq: Every day | ORAL | 1 refills | Status: DC

## 2017-09-18 DIAGNOSIS — H43812 Vitreous degeneration, left eye: Secondary | ICD-10-CM | POA: Diagnosis not present

## 2017-10-13 DIAGNOSIS — H524 Presbyopia: Secondary | ICD-10-CM | POA: Diagnosis not present

## 2017-10-15 DIAGNOSIS — H40003 Preglaucoma, unspecified, bilateral: Secondary | ICD-10-CM | POA: Diagnosis not present

## 2017-10-21 ENCOUNTER — Encounter: Payer: Self-pay | Admitting: Family Medicine

## 2017-10-21 ENCOUNTER — Ambulatory Visit (INDEPENDENT_AMBULATORY_CARE_PROVIDER_SITE_OTHER): Payer: Medicare HMO | Admitting: Family Medicine

## 2017-10-21 DIAGNOSIS — J0100 Acute maxillary sinusitis, unspecified: Secondary | ICD-10-CM

## 2017-10-21 MED ORDER — DOXYCYCLINE HYCLATE 100 MG PO TABS: 100.0000 mg | ORAL_TABLET | Freq: Two times a day (BID) | ORAL | 0 refills | Status: DC

## 2017-10-21 MED ORDER — PREDNISONE 10 MG PO TABS: ORAL_TABLET | ORAL | Status: DC

## 2017-10-21 NOTE — Patient Instructions (Signed)
Start doxycycline, start prednisone with food.  Update [place] as needed.  Rest and fluids.  Take care.  Glad to see you.

## 2017-10-21 NOTE — Progress Notes (Signed)
She had been in [place] and was fine during the trip.  She has been back for about 9 days.  She had possible sick exposures on the flight back.  She had chest congestion and cough after losing her voice.  Now with sinus pressure and chest congestion.  Nasal irritation.  Had a fever starting 2 days ago.  She has been sick for about 6 days, worse in the last ~3 days.  No discolored sputum.  HA noted.  No vomiting.  No diarrhea.  Taste is altered.  No rash.  Some occ wheeze.  H/o inhaler use in the distant past, nothing recently, only with illness.  Muscles in back sore from coughing.    She has mylagias on atorvastatin.   Meds, vitals, and allergies reviewed.   ROS: Per HPI unless specifically indicated in ROS section   GEN: nad, alert and oriented HEENT: mucous membranes moist, tm w/o erythema, nasal exam w/o erythema, clear discharge noted,  OP with cobblestoning NECK: supple w/o LA on the L side but 1 small mildly tender LN on the R side of the neck CV: rrr.   PULM: ctab in inspiration but diffuse B exp rhonchi, no inc wob EXT: no edema Max sinuses mildly ttp

## 2017-10-22 DIAGNOSIS — J0100 Acute maxillary sinusitis, unspecified: Secondary | ICD-10-CM | POA: Insufficient documentation

## 2017-10-22 NOTE — Assessment & Plan Note (Signed)
Nontoxic. Start doxycycline, start prednisone with food.  Update [place] as needed.  Rest and fluids o/w.  She agrees.  Routine cautions given on pred and abx.

## 2017-10-23 ENCOUNTER — Telehealth: Payer: Self-pay | Admitting: Internal Medicine

## 2017-10-23 MED ORDER — BENZONATATE 200 MG PO CAPS: 200.0000 mg | ORAL_CAPSULE | Freq: Three times a day (TID) | ORAL | 0 refills | Status: DC | PRN

## 2017-10-23 NOTE — Telephone Encounter (Signed)
Copied from [place]. Topic: Quick Communication - See Telephone Encounter >> Oct 23, 2017  2:30 PM [name], NT wrote: CRM for notification. See Telephone encounter for: Pt calling and states that she is taking the prednisone and doxycycline as suggested but wants to see if something can be called in for her cough. She said she is unable to sleep due to the coughing and her rib cage and chest are so sore due to the coughing. [facility] [place]   10/23/17.

## 2017-10-23 NOTE — Telephone Encounter (Signed)
Would try tessalon, rx sent.  Thanks.  Update [place] as needed.

## 2017-10-23 NOTE — Telephone Encounter (Signed)
Spoke with pt relaying message per Dr. Damita Dunnings.  Pt states her ins co will not cover rx.   Per Dr. Damita Dunnings, informed pt to try plain Robitussin or a partial fill of rx.  Pt wants to try plain Robitussin and will update Dr. Damita Dunnings in a few days.

## 2017-10-23 NOTE — Telephone Encounter (Signed)
Pt was seen 10/21/17. [facility].

## 2017-10-24 NOTE — Telephone Encounter (Signed)
Noted. Thanks.

## 2017-11-04 DIAGNOSIS — R69 Illness, unspecified: Secondary | ICD-10-CM | POA: Diagnosis not present

## 2017-11-05 ENCOUNTER — Telehealth: Payer: Self-pay | Admitting: Internal Medicine

## 2017-11-05 DIAGNOSIS — I10 Essential (primary) hypertension: Secondary | ICD-10-CM

## 2017-11-05 NOTE — Telephone Encounter (Signed)
Copied from [place] [id number]. Topic: Quick Communication - Rx Refill/Question >> Nov 05, 2017  1:28 PM Wynetta Emery, [place] C wrote: Medication: amLODipine (NORVASC) 2.5 MG tablet   Has the patient contacted their pharmacy? Yes    (Agent: If no, request that the patient contact the pharmacy for the refill.)   Preferred Pharmacy (with phone number or street name): [id number] [place], [address]   Agent: Please be advised that RX refills may take up to 3 business days. We ask that you follow-up with your pharmacy.

## 2017-11-06 ENCOUNTER — Telehealth: Payer: Self-pay | Admitting: *Deleted

## 2017-11-06 MED ORDER — AMLODIPINE BESYLATE 2.5 MG PO TABS: 2.5000 mg | ORAL_TABLET | Freq: Every day | ORAL | 0 refills | Status: DC

## 2017-11-06 NOTE — Telephone Encounter (Signed)
Patient came into office with cough and right sided soreness with slight bruising, patient seen on 10/23/17 for Bronchitis just finished ABX and steroid taper but has dry hacking cough, no appointments available scheduled with NP on Monday earliest available. Patient Vitals as follows 98.1 Pulse 82 02 sat 98 BP 180/94, elevated BP patient stated she had not taken BP medication that she takes in the evening that her BP always rises in doctors office also. Patient was advised to take BP medications when she returned home and to monitor BP  And if BP continued to remain above 170 she should be evaluated or if BP increased. Patient agreed. Patient also was advised any worsening symp toms she should be evaluated immediately .

## 2017-11-06 NOTE — Telephone Encounter (Signed)
Patient was advised by Kerin Salen to monitor blood pressure, take scheduled medication this evening, and follow up if BP remains elevated. She further instructed patient regarding signs that require further evaluation such as fever, or SOB. Patient informed Juliann Pulse that she will seek immediate medical attention if symptoms worsen particularly fever and SOB, otherwise she will be seen in the office on Monday for follow up of bronchitis

## 2017-11-10 ENCOUNTER — Ambulatory Visit (INDEPENDENT_AMBULATORY_CARE_PROVIDER_SITE_OTHER): Payer: Medicare HMO | Admitting: Family

## 2017-11-10 ENCOUNTER — Encounter: Payer: Self-pay | Admitting: Family

## 2017-11-10 VITALS — BP 138/82 | HR 79 | Temp 98.1°F | Resp 16 | Wt 131.0 lb

## 2017-11-10 DIAGNOSIS — T148XXA Other injury of unspecified body region, initial encounter: Secondary | ICD-10-CM | POA: Diagnosis not present

## 2017-11-10 DIAGNOSIS — H40003 Preglaucoma, unspecified, bilateral: Secondary | ICD-10-CM | POA: Diagnosis not present

## 2017-11-10 NOTE — Progress Notes (Signed)
Subjective:    Patient ID: Wendy Love, female    DOB: 1941/04/16, 77 y.o.   MRN: [id number]  CC: Wendy Love is a 77 y.o. female who presents today for an acute visit.    HPI: Noted bruising in suprapubic one week ago, Still coughing at that time. Bruise is unchanged. Soreness over right iliac.No abdominal pain, fever, dysuria. On asa, fish oil which has held over weekend.   Has seen Damita Dunnings- treated with doxycycline, prednisone, tessalon perles. States 'much better'. No fever, sob, cp. Notes some soreness around sides when cough.   Wanders if bruising from cough. In general doesn't bruise easily. Doesn't recall bumping into anything.  mentions that they did a lot of climbing while on trip to [place].  she slipped during a hike with right foot and reached with left arm and fell to both knees. Didn't hit head.   Returned from [place] 10/14/17.   HTN- compliant. .Denies exertional chest pain or pressure, numbness or tingling radiating to left arm or jaw, palpitations, dizziness, frequent headaches, changes in vision, or shortness of breath.   Notes sister recently told she carries trait for hematochromatosis.    2/14 [place]- treated with doxycycline and prednisone  HISTORY:  Past Medical History:  Diagnosis Date  . Anxiety   . Bronchitis   . Depression   . Endometriosis 1974   s/p abdominal surgery, 2nd surgery  . High cholesterol   . Hypertension   . Hyperthyroidism   . Rhinosinusitis   . Sciatica    Past Surgical History:  Procedure Laterality Date  . ABDOMINAL HYSTERECTOMY  2000  . APPENDECTOMY    . CARPAL TUNNEL RELEASE     bilateral  . Endoscopy/colonoscopy  April 2012   Schatski's Ring , small hiatal hernia  . hemilaminectomy  April 2010   L3-L4hemi, L4-L5 microdiskectomy   Family History  Problem Relation Age of Onset  . Ovarian cancer Mother   . Cancer Mother 88       ovarian cancer, treated by [facility]  . Heart disease Father     Allergies:  Atorvastatin; Brompheniramine-pseudoeph; Codeine; Diphenhydramine hcl; and Loratadine Current Outpatient Medications on File Prior to Visit  Medication Sig Dispense Refill  . amLODipine (NORVASC) 2.5 MG tablet Take 1 tablet (2.5 mg total) by mouth daily. 90 tablet 0  . aspirin EC 81 MG tablet Take 1 tablet (81 mg total) by mouth daily.  3  . b complex vitamins tablet Take 1 tablet by mouth daily.      . Calcium-Magnesium 500-250 MG TABS Take by mouth 2 (two) times daily.      . Cholecalciferol (VITAMIN D3) 2000 UNITS TABS Take 5,000 Units by mouth daily.     [name] levothyroxine (SYNTHROID, LEVOTHROID) 50 MCG tablet Take 1 tablet (50 mcg total) by mouth daily. 90 tablet 1  . losartan (COZAAR) 50 MG tablet Take 2 tablets (100 mg total) by mouth daily. 180 tablet 2  . Multiple Vitamin (MULTIVITAMIN) tablet Take 1 tablet by mouth daily.    [name] PROBIOTIC CAPS Take by mouth daily.      . temazepam (RESTORIL) 15 MG capsule TAKE 1 CAPSULE AT BEDTIME 90 capsule 1  . Triamcinolone Acetonide (NASACORT ALLERGY 24HR NA) Place 1 spray into the nose daily as needed.     . vitamin C (ASCORBIC ACID) 500 MG tablet Take 500 mg by mouth daily.     No current facility-administered medications on file prior to visit.  Social History   Tobacco Use  . Smoking status: Never Smoker  . Smokeless tobacco: Never Used  Substance Use Topics  . Alcohol use: No  . Drug use: No    Review of Systems  Constitutional: Negative for chills and fever.  HENT: Negative for congestion.   Respiratory: Positive for cough. Negative for shortness of breath and wheezing.   Cardiovascular: Negative for chest pain and palpitations.  Gastrointestinal: Negative for abdominal pain, nausea and vomiting.  Genitourinary: Negative for dysuria.  Hematological: Does not bruise/bleed easily.      Objective:    BP 138/82 (BP Location: Left Arm, Patient Position: Sitting, Cuff Size: Normal)   Pulse 79   Temp 98.1 F (36.7 C) (Oral)    Resp 16   Wt 131 lb (59.4 kg)   SpO2 98%   BMI 23.58 kg/m    Physical Exam  Constitutional: She appears well-developed and well-nourished.  Eyes: Conjunctivae are normal.  Cardiovascular: Normal rate, regular rhythm, normal heart sounds and normal pulses.  Pulmonary/Chest: Effort normal and breath sounds normal. She has no wheezes. She has no rhonchi. She has no rales.  Abdominal: Soft. Normal appearance and bowel sounds are normal. She exhibits no distension, no fluid wave, no ascites and no mass. There is no tenderness. There is no rigidity, no rebound, no guarding and no CVA tenderness.    Area of soreness as noted by patient. . No masses, bulging, erythema, increased warmth.  Neurological: She is alert.  Skin: Skin is warm and dry. Ecchymosis noted.     eccymosis noted suprapubic.  No hyperpigmentation noted.   Psychiatric: She has a normal mood and affect. Her speech is normal and behavior is normal. Thought content normal.  Vitals reviewed.      Assessment & Plan:    Problem List Items Addressed This Visit      Other   Bruising - Primary    Etiology unclear. Suspect from coughing, on ASA, or perhaps a slight bump caused bruising. Reassured by exam and absence of palpable mass, hematoma. Patient and I agreed regarding close vigilance and she will let me know if not self limiting.  Of note, she will follow up with PCP about screening for hematochromatosis at f/u in April.           I have discontinued Sheralyn W. O'Reilly "Polly"'s predniSONE, doxycycline, and benzonatate. I am also having her maintain her b complex vitamins, Vitamin D3, Calcium-Magnesium, Probiotic, multivitamin, Triamcinolone Acetonide (NASACORT ALLERGY 24HR NA), vitamin C, losartan, temazepam, aspirin EC, levothyroxine, and amLODipine.   No orders of the defined types were placed in this encounter.   Return precautions given.   Risks, benefits, and alternatives of the medications and  treatment plan prescribed today were discussed, and patient expressed understanding.   Education regarding symptom management and diagnosis given to patient on AVS.  Continue to follow with Crecencio Mc, MD for routine health maintenance.   Wendy Love and I agreed with plan.   Mable Paris, FNP

## 2017-11-10 NOTE — Assessment & Plan Note (Signed)
Etiology unclear. Suspect from coughing, on ASA, or perhaps a slight bump caused bruising. Reassured by exam and absence of palpable mass, hematoma. Patient and I agreed regarding close vigilance and she will let me know if not self limiting.  Of note, she will follow up with PCP about screening for hematochromatosis at f/u in April.

## 2017-11-10 NOTE — Patient Instructions (Signed)
As discussed, suspect bruising from coughing, aspirin, or perhaps muscle strain.  Stay vigilant and if doesn't continue to improve OR new symptoms develop please let me know. As discussed as well,. Please speak with Tullo about your sisters new diagnosis and if appropriate to test your iron level  Pleasure meeting you!

## 2017-12-24 DIAGNOSIS — H547 Unspecified visual loss: Secondary | ICD-10-CM | POA: Diagnosis not present

## 2017-12-24 DIAGNOSIS — Z8249 Family history of ischemic heart disease and other diseases of the circulatory system: Secondary | ICD-10-CM | POA: Diagnosis not present

## 2017-12-24 DIAGNOSIS — I10 Essential (primary) hypertension: Secondary | ICD-10-CM | POA: Diagnosis not present

## 2017-12-24 DIAGNOSIS — I951 Orthostatic hypotension: Secondary | ICD-10-CM | POA: Diagnosis not present

## 2017-12-24 DIAGNOSIS — Z809 Family history of malignant neoplasm, unspecified: Secondary | ICD-10-CM | POA: Diagnosis not present

## 2017-12-24 DIAGNOSIS — Z823 Family history of stroke: Secondary | ICD-10-CM | POA: Diagnosis not present

## 2017-12-24 DIAGNOSIS — G4700 Insomnia, unspecified: Secondary | ICD-10-CM | POA: Diagnosis not present

## 2017-12-24 DIAGNOSIS — R69 Illness, unspecified: Secondary | ICD-10-CM | POA: Diagnosis not present

## 2017-12-24 DIAGNOSIS — E039 Hypothyroidism, unspecified: Secondary | ICD-10-CM | POA: Diagnosis not present

## 2017-12-24 DIAGNOSIS — Z7722 Contact with and (suspected) exposure to environmental tobacco smoke (acute) (chronic): Secondary | ICD-10-CM | POA: Diagnosis not present

## 2017-12-30 ENCOUNTER — Encounter: Payer: Self-pay | Admitting: Internal Medicine

## 2017-12-30 ENCOUNTER — Ambulatory Visit (INDEPENDENT_AMBULATORY_CARE_PROVIDER_SITE_OTHER): Payer: Medicare HMO | Admitting: Internal Medicine

## 2017-12-30 VITALS — BP 168/90 | HR 69 | Temp 98.1°F | Resp 14 | Ht 63.25 in | Wt 132.2 lb

## 2017-12-30 DIAGNOSIS — R059 Cough, unspecified: Secondary | ICD-10-CM

## 2017-12-30 DIAGNOSIS — I10 Essential (primary) hypertension: Secondary | ICD-10-CM | POA: Diagnosis not present

## 2017-12-30 DIAGNOSIS — R05 Cough: Secondary | ICD-10-CM | POA: Diagnosis not present

## 2017-12-30 DIAGNOSIS — R5383 Other fatigue: Secondary | ICD-10-CM

## 2017-12-30 DIAGNOSIS — M791 Myalgia, unspecified site: Secondary | ICD-10-CM | POA: Insufficient documentation

## 2017-12-30 DIAGNOSIS — Z789 Other specified health status: Secondary | ICD-10-CM

## 2017-12-30 DIAGNOSIS — M7042 Prepatellar bursitis, left knee: Secondary | ICD-10-CM | POA: Diagnosis not present

## 2017-12-30 DIAGNOSIS — E890 Postprocedural hypothyroidism: Secondary | ICD-10-CM | POA: Diagnosis not present

## 2017-12-30 DIAGNOSIS — T466X5A Adverse effect of antihyperlipidemic and antiarteriosclerotic drugs, initial encounter: Secondary | ICD-10-CM | POA: Insufficient documentation

## 2017-12-30 DIAGNOSIS — E7800 Pure hypercholesterolemia, unspecified: Secondary | ICD-10-CM

## 2017-12-30 DIAGNOSIS — M7041 Prepatellar bursitis, right knee: Secondary | ICD-10-CM

## 2017-12-30 LAB — CBC WITH DIFFERENTIAL/PLATELET
BASOS PCT: 0.7 % (ref 0.0–3.0)
Basophils Absolute: 0 10*3/uL (ref 0.0–0.1)
EOS PCT: 3.8 % (ref 0.0–5.0)
Eosinophils Absolute: 0.2 10*3/uL (ref 0.0–0.7)
HCT: 42.1 % (ref 36.0–46.0)
HEMOGLOBIN: 14.8 g/dL (ref 12.0–15.0)
Lymphocytes Relative: 32 % (ref 12.0–46.0)
Lymphs Abs: 1.6 10*3/uL (ref 0.7–4.0)
MCHC: 35.2 g/dL (ref 30.0–36.0)
MCV: 94.9 fl (ref 78.0–100.0)
MONO ABS: 0.4 10*3/uL (ref 0.1–1.0)
Monocytes Relative: 8.6 % (ref 3.0–12.0)
NEUTROS ABS: 2.7 10*3/uL (ref 1.4–7.7)
Neutrophils Relative %: 54.9 % (ref 43.0–77.0)
PLATELETS: 252 10*3/uL (ref 150.0–400.0)
RBC: 4.44 Mil/uL (ref 3.87–5.11)
RDW: 12.7 % (ref 11.5–15.5)
WBC: 4.9 10*3/uL (ref 4.0–10.5)

## 2017-12-30 LAB — COMPREHENSIVE METABOLIC PANEL
ALT: 20 U/L (ref 0–35)
AST: 27 U/L (ref 0–37)
Albumin: 4.3 g/dL (ref 3.5–5.2)
Alkaline Phosphatase: 58 U/L (ref 39–117)
BILIRUBIN TOTAL: 0.5 mg/dL (ref 0.2–1.2)
BUN: 13 mg/dL (ref 6–23)
CHLORIDE: 97 meq/L (ref 96–112)
CO2: 31 meq/L (ref 19–32)
CREATININE: 0.73 mg/dL (ref 0.40–1.20)
Calcium: 9.6 mg/dL (ref 8.4–10.5)
GFR: 82.03 mL/min (ref >60.00)
GLUCOSE: 108 mg/dL — AB (ref 70–99)
Potassium: 4.1 mEq/L (ref 3.5–5.1)
SODIUM: 132 meq/L — AB (ref 135–145)
Total Protein: 6.9 g/dL (ref 6.0–8.3)

## 2017-12-30 LAB — TSH: TSH: 2.51 u[IU]/mL (ref 0.35–4.50)

## 2017-12-30 MED ORDER — TEMAZEPAM 15 MG PO CAPS: 15.0000 mg | ORAL_CAPSULE | Freq: Every day | ORAL | 5 refills | Status: DC

## 2017-12-30 NOTE — Progress Notes (Signed)
Subjective:  Patient ID: Wendy Love, female    DOB: 02-17-1941  Age: 77 y.o. MRN: [id number]  CC: The primary encounter diagnosis was Cough in adult patient. Diagnoses of Statin intolerance, Postoperative hypothyroidism, Fatigue, unspecified type, Iron overload, Essential hypertension, Pure hypercholesterolemia, and Prepatellar bursitis of both knees were also pertinent to this visit.  HPI Wendy Love presents for follow up on hypertension  Hypothyroidism  Etc   Sent to cardiology last fall for claudication  AORTIC ATHEROSCLEROSIS NOTED AND POSSIBLE NON OBSTRUCTIVE PAD,    Advised by Gollan to start asa and LOW DOSE LIPITOR  FOR ldl 150   With a TOTAL 260.  Stopped the medication after 3 weeks due to muscle soreness and has no intentnion of restarting medication bc muscle still hurt.  Calves did not hurt during walking tour of [place]   treated  for uri after [place] trip by other MD that she caught on the trip home from [place]   With doxycycline, prednisone . The cough lasted several weeks and  Is still preent in the morning  feels chest tightness,  A need to cough,  No sputum .caused her to break some blood vessels in her right groin.   Right knee swelling  And mildly painful  Occurred  After kneeling in the  garden  .   Sister diagnosed with HH . Wants to be tested.     Lab Results  Component Value Date   IRON 151 12/30/2017   TIBC 292 12/30/2017   FERRITIN 33 12/30/2017     Lab Results  Component Value Date   CHOL 265 (H) 07/18/2017   HDL 98.20 07/18/2017   LDLCALC 152 (H) 07/18/2017   LDLDIRECT 126.6 12/22/2012   TRIG 73.0 07/18/2017   CHOLHDL 3 07/18/2017       Outpatient Medications Prior to Visit  Medication Sig Dispense Refill  . amLODipine (NORVASC) 2.5 MG tablet Take 1 tablet (2.5 mg total) by mouth daily. 90 tablet 0  . aspirin EC 81 MG tablet Take 1 tablet (81 mg total) by mouth daily.  3  . b complex vitamins tablet Take 1 tablet by mouth  daily.      . Calcium-Magnesium 500-250 MG TABS Take by mouth 2 (two) times daily.      . Cholecalciferol (VITAMIN D3) 2000 UNITS TABS Take 5,000 Units by mouth daily.     [name] levothyroxine (SYNTHROID, LEVOTHROID) 50 MCG tablet Take 1 tablet (50 mcg total) by mouth daily. 90 tablet 1  . losartan (COZAAR) 50 MG tablet Take 2 tablets (100 mg total) by mouth daily. 180 tablet 2  . Multiple Vitamin (MULTIVITAMIN) tablet Take 1 tablet by mouth daily.    [name] PROBIOTIC CAPS Take by mouth daily.      . Triamcinolone Acetonide (NASACORT ALLERGY 24HR NA) Place 1 spray into the nose daily as needed.     . vitamin C (ASCORBIC ACID) 500 MG tablet Take 500 mg by mouth daily.    . temazepam (RESTORIL) 15 MG capsule TAKE 1 CAPSULE AT BEDTIME 90 capsule 1   No facility-administered medications prior to visit.     Review of Systems;  Patient denies headache, fevers, malaise, unintentional weight loss, skin rash, eye pain, sinus congestion and sinus pain, sore throat, dysphagia,  hemoptysis , cough, dyspnea, wheezing, chest pain, palpitations, orthopnea, edema, abdominal pain, nausea, melena, diarrhea, constipation, flank pain, dysuria, hematuria, urinary  Frequency, nocturia, numbness, tingling, seizures,  Focal weakness, Loss of consciousness,  Tremor,  insomnia, depression, anxiety, and suicidal ideation.      Objective:  BP (!) 168/90 (BP Location: Left Arm, Patient Position: Sitting, Cuff Size: Normal)   Pulse 69   Temp 98.1 F (36.7 C) (Oral)   Resp 14   Ht 5' 3.25" (1.607 m)   Wt 132 lb 3.2 oz (60 kg)   SpO2 98%   BMI 23.23 kg/m   BP Readings from Last 3 Encounters:  12/30/17 (!) 168/90  11/10/17 138/82  10/21/17 (!) 156/86    Wt Readings from Last 3 Encounters:  12/30/17 132 lb 3.2 oz (60 kg)  11/10/17 131 lb (59.4 kg)  10/21/17 129 lb 12 oz (58.9 kg)    General appearance: alert, cooperative and appears stated age Ears: normal TM's and external ear canals both ears Throat: lips,  mucosa, and tongue normal; teeth and gums normal Neck: no adenopathy, no carotid bruit, supple, symmetrical, trachea midline and thyroid not enlarged, symmetric, no tenderness/mass/nodules Back: symmetric, no curvature. ROM normal. No CVA tenderness. Lungs: clear to auscultation bilaterally Heart: regular rate and rhythm, S1, S2 normal, no murmur, click, rub or gallop Abdomen: soft, non-tender; bowel sounds normal; no masses,  no organomegaly Pulses: 2+ and symmetric Skin: Skin color, texture, turgor normal. No rashes or lesions Lymph nodes: Cervical, supraclavicular, and axillary nodes normal.  Lab Results  Component Value Date   HGBA1C 5.1 07/04/2015   HGBA1C 5.0 06/01/2014   HGBA1C 5.5 10/16/2010    Lab Results  Component Value Date   CREATININE 0.73 12/30/2017   CREATININE 0.67 07/18/2017   CREATININE 0.68 12/11/2016    Lab Results  Component Value Date   WBC 4.9 12/30/2017   HGB 14.8 12/30/2017   HCT 42.1 12/30/2017   PLT 252.0 12/30/2017   GLUCOSE 108 (H) 12/30/2017   CHOL 265 (H) 07/18/2017   TRIG 73.0 07/18/2017   HDL 98.20 07/18/2017   LDLDIRECT 126.6 12/22/2012   LDLCALC 152 (H) 07/18/2017   ALT 20 12/30/2017   AST 27 12/30/2017   NA 132 (L) 12/30/2017   K 4.1 12/30/2017   CL 97 12/30/2017   CREATININE 0.73 12/30/2017   BUN 13 12/30/2017   CO2 31 12/30/2017   TSH 2.51 12/30/2017   HGBA1C 5.1 07/04/2015   MICROALBUR 0.2 11/26/2013    Assessment & Plan:   Problem List Items Addressed This Visit    Hypothyroidism   Relevant Orders   Comprehensive metabolic panel (Completed)   TSH (Completed)   Statin intolerance   Iron overload    Screening for HH requested due to sister's diagnosis.  screening is planned due to iron levels noting transferrin saturation of 51%.  Ferritin and hgb are normal.       Relevant Orders   HFE-Associated Hereditary Hemochromatosis (COHESION)   Hyperlipidemia    Untreated due to statin intolerance. Red yeast rice trial  recommended       Essential hypertension    she reports compliance with medication regimen  but has an elevated reading today in office.  She has been asked to check his BP at home and  submit readings for evaluation. Renal function will be checked today  Lab Results  Component Value Date   CREATININE 0.73 12/30/2017   Lab Results  Component Value Date   NA 132 (L) 12/30/2017   K 4.1 12/30/2017   CL 97 12/30/2017   CO2 31 12/30/2017         Bursitis, knee    Secondary to kneeling in garden.  Ice,  turmeric       Other Visit Diagnoses    Cough in adult patient    -  Primary   Relevant Orders   DG Chest 2 View   Fatigue, unspecified type       Relevant Orders   Iron, TIBC and Ferritin Panel (Completed)   CBC with Differential/Platelet (Completed)    A total of 40 minutes was spent with patient more than half of which was spent in counseling patient on the above mentioned issues , reviewing and explaining recent labs and imaging studies done, and coordination of care.  I have changed Wendy Love "Polly"'s temazepam. I am also having her maintain her b complex vitamins, Vitamin D3, Calcium-Magnesium, Probiotic, multivitamin, Triamcinolone Acetonide (NASACORT ALLERGY 24HR NA), vitamin C, losartan, aspirin EC, levothyroxine, and amLODipine.  Meds ordered this encounter  Medications  . temazepam (RESTORIL) 15 MG capsule    Sig: Take 1 capsule (15 mg total) by mouth at bedtime.    Dispense:  30 capsule    Refill:  5    Medications Discontinued During This Encounter  Medication Reason  . temazepam (RESTORIL) 15 MG capsule Reorder    Follow-up: Return in about 6 months (around 07/01/2018).   Crecencio Mc, MD

## 2017-12-30 NOTE — Patient Instructions (Addendum)
The natural remedies for cholesterol have not been proven to reduce your risk for a heart attack.  Red Yeast Rice has not been proven either,  But does lower cholesterol, so if you want to try it , the dose is 600 mg twice daily in capsule form, available OTC.   I agree with a trial of  turmeric for your knee bursitis .  If this doesn't help,  You can return here and I will give you a steroid injection in your knee ,  Or I am happy to refer you to Orthopedics (Dr Roland Rack) .  You can add up to 2000 mg of plain tylenol for pain  daily    Prepatellar Bursitis Prepatellar bursitis is inflammation of the prepatellar bursa. The prepatellar bursa is a fluid-filled sac that cushions the kneecap (patella). Prepatellar bursitis happens when fluid builds up in the this sac and causes it to get larger. The condition causes knee pain. What are the causes? This condition may be caused by:  Constant pressure on the knees from kneeling.  A hit to the knee.  Falling on the knee.  A bacterial infection.  Moving the knee often in a forceful way.  What increases the risk? This condition is more likely to develop in:  People who play a sport that involves a risk for falls on the knee or hard hits (blows) to the knee. These sports include: ? Football. ? Wrestling. ? Basketball. ? Soccer.  People who have to kneel for long periods of time, such as roofers, plumbers, and gardeners.  People with another inflammatory condition, such as gout or rheumatoid arthritis.  What are the signs or symptoms? The most common symptom of this condition is knee pain that gets better with rest. Other symptoms include:  Swelling on the front of the kneecap.  Warmth in the knee.  Tenderness with activity.  Redness in the knee.  Inability to bend the knee or to kneel.  How is this diagnosed? This condition may be diagnosed based on:  Your symptoms.  Your medical history.  A physical exam. During the exam,  your provider will compare your knees and check for tenderness and pain when moving your knee. Your health care provider may also use a needle to remove fluid from the bursa to help diagnose an infection.  Tests, such as: ? A blood test that checks for infection. ? X-rays. These may be taken to check the structure of the patella. ? MRI or ultrasound. These may be done to check for swelling and fluid buildup in the bursa.  How is this treated? This condition may be treated by:  Resting the knee.  Applying ice to the knee.  Medicine, such as: ? Nonsteroidal anti-inflammatory drugs (NSAIDs). These can help to reduce pain and swelling. ? Antibiotic medicines. These may be needed if you have an infection. ? Steroid medicines. These may be prescribed if other treatments are not helping.  Raising (elevating) the knee while resting.  Doing strengthening and stretching exercises (physical therapy). These may be recommended after pain and swelling improve.  Having a procedure to remove fluid from the bursa. This may be done if other treatment is not helping.  Having surgery to remove the bursa. This may be done if you have a severe infection or if the condition keeps coming back after treatment.  Follow these instructions at home: Medicines  Take over-the-counter and prescription medicines only as told by your health care provider.  If you were  prescribed an antibiotic medicine, take it as told by your health care provider. Do not stop taking the antibiotic even if you start to feel better. Managing pain, stiffness, and swelling  If directed, apply ice to your knee. ? Put ice in a plastic bag. ? Place a towel between your skin and the bag. ? Leave the ice on for 20 minutes, 2-3 times a day.  Elevate your knee above the level of your heart while you are sitting or lying down. Driving  Do not drive or operate heavy machinery while taking prescription pain medicine.  Ask your health  care provider when it is safe fpr you to drive. Activity  Rest your knee.  Avoid activities that cause pain.  Return to your normal activities as told by your health care provider. Ask your health care provider what activities are safe for you.  Do exercises as told by your health care provider. General instructions  Do not use the injured limb to support your body weight until your health care provider says that you can.  Do not use any tobacco products, such as cigarettes, chewing tobacco, and e-cigarettes. Tobacco can delay bone healing. If you need help quitting, ask your health care provider.  Keep all follow-up visits as told by your health care provider. This is important. How is this prevented?  Warm up and stretch before being active.  Cool down and stretch after being active.  Give your body time to rest between periods of activity.  Make sure to use equipment that fits you.  Be safe and responsible while being active to avoid falls.  Do at least 150 minutes of moderate-intensity exercise each week, such as brisk walking or water aerobics.  Maintain physical fitness, including: ? Strength. ? Flexibility. ? Cardiovascular fitness. ? Endurance. Contact a health care provider if:  Your symptoms do not improve.  Your symptoms get worse.  Your symptoms keep coming back after treatment.  You develop a fever and have warmth, redness, and swelling over your knee. This information is not intended to replace advice given to you by your health care provider. Make sure you discuss any questions you have with your health care provider. Document Released: 08/26/2005 Document Revised: 04/30/2016 Document Reviewed: 05/26/2015 Elsevier Interactive Patient Education  [employer].

## 2017-12-31 DIAGNOSIS — M7050 Other bursitis of knee, unspecified knee: Secondary | ICD-10-CM | POA: Insufficient documentation

## 2017-12-31 LAB — IRON,TIBC AND FERRITIN PANEL
%SAT: 52 % — AB (ref 11–50)
Ferritin: 33 ng/mL (ref 20–288)
Iron: 151 ug/dL (ref 45–160)
TIBC: 292 mcg/dL (calc) (ref 250–450)

## 2017-12-31 NOTE — Assessment & Plan Note (Signed)
Screening for [facility] requested due to sister's diagnosis.  screening is planned due to iron levels noting transferrin saturation of 51%.  Ferritin and hgb are normal.

## 2017-12-31 NOTE — Assessment & Plan Note (Signed)
Untreated due to statin intolerance. Red yeast rice trial recommended

## 2017-12-31 NOTE — Assessment & Plan Note (Signed)
she reports compliance with medication regimen  but has an elevated reading today in office.  She has been asked to check his BP at home and  submit readings for evaluation. Renal function will be checked today  Lab Results  Component Value Date   CREATININE 0.73 12/30/2017   Lab Results  Component Value Date   NA 132 (L) 12/30/2017   K 4.1 12/30/2017   CL 97 12/30/2017   CO2 31 12/30/2017

## 2017-12-31 NOTE — Assessment & Plan Note (Addendum)
Secondary to kneeling in garden.  Ice,  turmeric

## 2018-01-02 ENCOUNTER — Telehealth: Payer: Self-pay | Admitting: Internal Medicine

## 2018-01-02 DIAGNOSIS — E785 Hyperlipidemia, unspecified: Secondary | ICD-10-CM

## 2018-01-02 NOTE — Telephone Encounter (Signed)
Copied from [place]. Topic: Quick Communication - See Telephone Encounter >> Jan 02, 2018  1:11 PM Rutherford Nail, [place] wrote: CRM for notification. See Telephone encounter for: 01/02/18. Patient calling and schedule her hemachromatosis screening blood test, but she is also wanting to see about getting a Cholesterol lever checked? States she spoke with Dr Derrel Nip about red yeast rice, but patient states she would like a test done to know where her base line is. Please call her and inform her of whether this test can be added to her appointment on 01/09/18. Please advise. CB#: [phone]

## 2018-01-02 NOTE — Telephone Encounter (Signed)
Is it okay to add a lipid panel to pt's blood work to be drawn on 01/09/2018?

## 2018-01-02 NOTE — Telephone Encounter (Signed)
Please advise 

## 2018-01-05 NOTE — Telephone Encounter (Signed)
It was already ordered in February,  I hve reordered it

## 2018-01-05 NOTE — Telephone Encounter (Signed)
Spoke with pt and informed her that we have added the lipid panel to her bllod work on 01/09/2018. Pt is aware that she needs to be fasting when she comes in.

## 2018-01-09 ENCOUNTER — Other Ambulatory Visit (INDEPENDENT_AMBULATORY_CARE_PROVIDER_SITE_OTHER): Payer: Medicare HMO

## 2018-01-09 DIAGNOSIS — E785 Hyperlipidemia, unspecified: Secondary | ICD-10-CM | POA: Diagnosis not present

## 2018-01-09 LAB — LIPID PANEL
Cholesterol: 213 mg/dL — ABNORMAL HIGH (ref 0–200)
HDL: 96.6 mg/dL (ref >39.00)
LDL Cholesterol: 102 mg/dL — ABNORMAL HIGH (ref 0–99)
NonHDL: 116.27
TRIGLYCERIDES: 70 mg/dL (ref 0.0–149.0)
Total CHOL/HDL Ratio: 2
VLDL: 14 mg/dL (ref 0.0–40.0)

## 2018-01-13 ENCOUNTER — Encounter: Payer: Self-pay | Admitting: Internal Medicine

## 2018-01-13 LAB — HEMOCHROMATOSIS DNA-PCR(C282Y,H63D)

## 2018-01-21 ENCOUNTER — Telehealth: Payer: Self-pay | Admitting: Internal Medicine

## 2018-01-21 ENCOUNTER — Other Ambulatory Visit: Payer: Self-pay | Admitting: Internal Medicine

## 2018-01-21 NOTE — Telephone Encounter (Signed)
Please advise 

## 2018-01-21 NOTE — Telephone Encounter (Signed)
I am happy to do it . You can squeeze her in at 1:15 on Friday

## 2018-01-21 NOTE — Telephone Encounter (Signed)
Are you willing to do knee injections or does pt need a referral?

## 2018-01-21 NOTE — Telephone Encounter (Signed)
Copied from [place] [id number]. Topic: Quick Communication - See Telephone Encounter >> Jan 21, 2018  3:35 PM [place], [place] Emperor wrote: CRM for notification. See Telephone encounter for: 01/21/18.  Pt would like to come in to have a cortisone shot in her knee.  Please f/u with pt

## 2018-01-22 NOTE — Telephone Encounter (Signed)
Spoke with pt and she is scheduled for tomorrow at 1:15pm. Pt is aware of appt date and time.

## 2018-01-23 ENCOUNTER — Ambulatory Visit (INDEPENDENT_AMBULATORY_CARE_PROVIDER_SITE_OTHER): Payer: Medicare HMO | Admitting: Internal Medicine

## 2018-01-23 ENCOUNTER — Encounter: Payer: Self-pay | Admitting: Internal Medicine

## 2018-01-23 VITALS — BP 150/74 | HR 86 | Temp 98.1°F | Resp 14 | Ht 63.25 in | Wt 131.4 lb

## 2018-01-23 DIAGNOSIS — M7042 Prepatellar bursitis, left knee: Secondary | ICD-10-CM | POA: Diagnosis not present

## 2018-01-23 DIAGNOSIS — M7041 Prepatellar bursitis, right knee: Secondary | ICD-10-CM | POA: Diagnosis not present

## 2018-01-23 DIAGNOSIS — M25561 Pain in right knee: Secondary | ICD-10-CM | POA: Diagnosis not present

## 2018-01-23 MED ORDER — TRIAMCINOLONE ACETONIDE 40 MG/ML IJ SUSP
40.0000 mg | Freq: Once | INTRAMUSCULAR | Status: AC
  Administered 2018-01-23: 40 mg via INTRAMUSCULAR

## 2018-01-23 MED ORDER — LIDOCAINE HCL 1 % IJ SOLN
5.0000 mL | Freq: Once | INTRAMUSCULAR | Status: AC
  Administered 2018-01-23: 5 mL

## 2018-01-23 NOTE — Progress Notes (Signed)
Subjective:  Patient ID: Wendy Love, female    DOB: 02-21-1941  Age: 77 y.o. MRN: [id number]  CC: The primary encounter diagnosis was Right knee pain, unspecified chronicity. A diagnosis of Prepatellar bursitis of both knees was also pertinent to this visit.  HPI Wendy Love presents for treatment of persistent right knee pain for the past 6 weeks  .  She has pain at rest as well as with activity,  Denies erythema ,  Warmth and significant swelling.  Has been using ice and taking turmeric with no significant improvement.  She is requesting a steroid injection.  Outpatient Medications Prior to Visit  Medication Sig Dispense Refill  . amLODipine (NORVASC) 2.5 MG tablet Take 1 tablet (2.5 mg total) by mouth daily. 90 tablet 0  . aspirin EC 81 MG tablet Take 1 tablet (81 mg total) by mouth daily.  3  . b complex vitamins tablet Take 1 tablet by mouth daily.      . Calcium-Magnesium 500-250 MG TABS Take by mouth 2 (two) times daily.      . Cholecalciferol (VITAMIN D3) 2000 UNITS TABS Take 5,000 Units by mouth daily.     [name] levothyroxine (SYNTHROID, LEVOTHROID) 50 MCG tablet Take 1 tablet (50 mcg total) by mouth daily. 90 tablet 1  . losartan (COZAAR) 50 MG tablet TAKE 2 TABLETS BY MOUTH EVERYDAY 180 tablet 1  . Multiple Vitamin (MULTIVITAMIN) tablet Take 1 tablet by mouth daily.    [name] PROBIOTIC CAPS Take by mouth daily.      . Red Yeast Rice 600 MG CAPS Take 600 mg by mouth daily.    . temazepam (RESTORIL) 15 MG capsule Take 1 capsule (15 mg total) by mouth at bedtime. 30 capsule 5  . Triamcinolone Acetonide (NASACORT ALLERGY 24HR NA) Place 1 spray into the nose daily as needed.     . TURMERIC PO Take 1 capsule by mouth daily.    . vitamin C (ASCORBIC ACID) 500 MG tablet Take 500 mg by mouth daily.     No facility-administered medications prior to visit.     Review of Systems;  Patient denies headache, fevers, malaise, unintentional weight loss, skin rash, eye pain, sinus  congestion and sinus pain, sore throat, dysphagia,  hemoptysis , cough, dyspnea, wheezing, chest pain, palpitations, orthopnea, edema, abdominal pain, nausea, melena, diarrhea, constipation, flank pain, dysuria, hematuria, urinary  Frequency, nocturia, numbness, tingling, seizures,  Focal weakness, Loss of consciousness,  Tremor, insomnia, depression, anxiety, and suicidal ideation.      Objective:  BP (!) 150/74 (BP Location: Left Arm, Patient Position: Sitting, Cuff Size: Normal)   Pulse 86   Temp 98.1 F (36.7 C) (Oral)   Resp 14   Ht 5' 3.25" (1.607 m)   Wt 131 lb 6.4 oz (59.6 kg)   SpO2 98%   BMI 23.09 kg/m   BP Readings from Last 3 Encounters:  01/23/18 (!) 150/74  12/30/17 (!) 168/90  11/10/17 138/82    Wt Readings from Last 3 Encounters:  01/23/18 131 lb 6.4 oz (59.6 kg)  12/30/17 132 lb 3.2 oz (60 kg)  11/10/17 131 lb (59.4 kg)    General appearance: alert, cooperative and appears stated age Lungs: clear to auscultation bilaterally Heart: regular rate and rhythm, S1, S2 normal, no murmur, click, rub or gallop MSSK:  Right knee without warmth swelling or redness   Lab Results  Component Value Date   HGBA1C 5.1 07/04/2015   HGBA1C 5.0 06/01/2014   HGBA1C  5.5 10/16/2010    Lab Results  Component Value Date   CREATININE 0.73 12/30/2017   CREATININE 0.67 07/18/2017   CREATININE 0.68 12/11/2016    Lab Results  Component Value Date   WBC 4.9 12/30/2017   HGB 14.8 12/30/2017   HCT 42.1 12/30/2017   PLT 252.0 12/30/2017   GLUCOSE 108 (H) 12/30/2017   CHOL 213 (H) 01/09/2018   TRIG 70.0 01/09/2018   HDL 96.60 01/09/2018   LDLDIRECT 126.6 12/22/2012   LDLCALC 102 (H) 01/09/2018   ALT 20 12/30/2017   AST 27 12/30/2017   NA 132 (L) 12/30/2017   K 4.1 12/30/2017   CL 97 12/30/2017   CREATININE 0.73 12/30/2017   BUN 13 12/30/2017   CO2 31 12/30/2017   TSH 2.51 12/30/2017   HGBA1C 5.1 07/04/2015   MICROALBUR 0.2 11/26/2013    Ct Angio Ao+bifem W & Or  Wo Contrast  Result Date: 08/08/2017 CLINICAL DATA:  Calf pain after walking 10-15 minutes. Venous insufficiency. EXAM: CT ANGIOGRAPHY OF ABDOMINAL AORTA WITH ILIOFEMORAL RUNOFF TECHNIQUE: Multidetector CT imaging of the abdomen, pelvis and lower extremities was performed using the standard protocol during bolus administration of intravenous contrast. Multiplanar CT image reconstructions and MIPs were obtained to evaluate the vascular anatomy. CONTRAST:  156mL ISOVUE-370 IOPAMIDOL (ISOVUE-370) INJECTION 76% COMPARISON:  None. FINDINGS: VASCULAR Aorta: Atherosclerotic calcifications in the abdominal aorta without aneurysm or dissection. Celiac: Patent without evidence of aneurysm, dissection, vasculitis or significant stenosis. SMA: Patent without evidence of aneurysm, dissection, vasculitis or significant stenosis. Renals: There are 2 renal arteries bilaterally. The superior right renal artery is the dominant right renal artery. The superior right renal artery has a beaded appearance and concerning for fibromuscular dysplasia. There is also some ectasia along the distal aspect of this superior right renal artery. Inferior right renal artery is small and comes off the anterior aspect of the abdominal aorta. Main left renal artery comes off the aorta near the 5 o'clock position without significant stenosis. There is a very small inferior left renal artery. IMA: IMA is patent. RIGHT Lower Extremity Inflow: Atherosclerotic calcifications in the right iliac arteries without significant stenosis. Outflow: Right profunda femoral arteries are patent. Right common femoral artery is patent without significant stenosis. Right SFA is patent without significant stenosis. Mild plaque in the right popliteal artery without significant stenosis. Runoff: Right anterior tibial artery is patent with focal mild to moderate stenosis in the proximal calf. Dorsalis pedis artery is patent. Disease at the tibioperoneal trunk. Extensive  disease in the posterior tibial artery without any significant flow in the proximal calf. There is some reconstitution of the posterior tibial artery. Partial reconstitution of the distal calf posterior tibial artery. No significant flow in the posterior tibial artery at the ankle but there is some reconstitution in the foot. Diffuse disease throughout the right peroneal artery. Dominant runoff vessel is the anterior tibial artery. LEFT Lower Extremity Inflow: Left iliac arteries are patent with atherosclerotic calcifications. Negative for aneurysm or significant stenosis. Outflow: Left common femoral artery is patent. Left profunda femoral artery is patent. Left SFA is patent with minimal disease. Mild disease throughout the left popliteal artery without significant stenosis. Runoff: Stenosis at the origin of the left anterior tibial artery. Short-segment occlusion or high-grade narrowing in the proximal left anterior tibial artery. Anterior tibial artery is patent in the mid and distal calf. Dorsalis pedis artery is patent. Posterior tibial artery is occluded. Peroneal artery is patent. There is no significant reconstitution of the posterior  tibial artery. Veins: No obvious venous abnormality within the limitations of this arterial phase study. Review of the MIP images confirms the above findings. NON-VASCULAR Lower chest: Lung bases are clear. Hepatobiliary: No gross abnormality to the liver or gallbladder on this arterial phase imaging. Pancreas: Normal appearance of the pancreas without inflammation or duct dilatation. Spleen: Normal appearance of spleen without enlargement. Adrenals/Urinary Tract: Adrenal glands are within normal limits. Fullness in the right renal pelvis may represent an extrarenal pelvis. No significant hydronephrosis. No suspicious renal lesions. Stomach/Bowel: No evidence for bowel obstruction. No focal bowel inflammation. Lymphatic: Small left periaortic lymph nodes. Otherwise, no  significant lymph node enlargement in the abdomen or pelvis. Reproductive: Status post hysterectomy. No adnexal masses. Other: Negative for free air.  No free fluid. Musculoskeletal: Multilevel degenerative disease in the lumbar spine. There is complete disc space loss at L4-L5 and L2-L3. Anterolisthesis at L4-L5. S-shaped scoliosis in lumbar spine. IMPRESSION: VASCULAR Bilateral runoff disease. Anterior tibial artery is the dominant runoff vessel bilaterally but there is anterior tibial artery disease bilaterally. No significant inflow or outflow disease. Dominant right renal artery has a beaded appearance and findings raise concern for fibromuscular dysplasia. This could be clinically significant if the patient is hypertensive. NON-VASCULAR No acute abnormality within the abdomen or pelvis. Scoliosis with severe disc disease in lumbar spine. Electronically Signed   By: Markus Daft M.D.   On: 08/08/2017 14:02    Assessment & Plan:   Problem List Items Addressed This Visit    Bursitis, knee    After obtaining informed consent for  an I/A injection of the right knee,  The right knee was cleaned with betadine and alcohol.  Topical anesthetic was sprayed on medial side of patella and 40 mg Kenalog mixed with 4 ml 1% lidocaine was injected without difficulty into the bursa,  Patient tolerated the procedure without complications or bleeding.  Patient was advised to apply ice for 15 minutes every few hours for the first 24 hours and to avoid strenuous activity for up to 7 days        Relevant Medications   triamcinolone acetonide (KENALOG-40) injection 40 mg (Completed)    Other Visit Diagnoses    Right knee pain, unspecified chronicity    -  Primary   Relevant Medications   triamcinolone acetonide (KENALOG-40) injection 40 mg (Completed)   lidocaine (XYLOCAINE) 1 % (with pres) injection 5 mL (Completed)      I am having Wendy Love "Polly" maintain her b complex vitamins, Vitamin D3,  Calcium-Magnesium, Probiotic, multivitamin, Triamcinolone Acetonide (NASACORT ALLERGY 24HR NA), vitamin C, aspirin EC, levothyroxine, amLODipine, temazepam, losartan, Red Yeast Rice, and TURMERIC PO. We administered triamcinolone acetonide and lidocaine.  Meds ordered this encounter  Medications  . triamcinolone acetonide (KENALOG-40) injection 40 mg  . lidocaine (XYLOCAINE) 1 % (with pres) injection 5 mL    There are no discontinued medications.  Follow-up: No follow-ups on file.   Crecencio Mc, MD

## 2018-01-25 NOTE — Assessment & Plan Note (Signed)
After obtaining informed consent for  an I/A injection of the right knee,  The right knee was cleaned with betadine and alcohol.  Topical anesthetic was sprayed on medial side of patella and 40 mg Kenalog mixed with 4 ml 1% lidocaine was injected without difficulty into the bursa,  Patient tolerated the procedure without complications or bleeding.  Patient was advised to apply ice for 15 minutes every few hours for the first 24 hours and to avoid strenuous activity for up to 7 days    

## 2018-01-28 ENCOUNTER — Other Ambulatory Visit: Payer: Self-pay | Admitting: Internal Medicine

## 2018-01-28 DIAGNOSIS — I10 Essential (primary) hypertension: Secondary | ICD-10-CM

## 2018-02-05 ENCOUNTER — Telehealth: Payer: Self-pay

## 2018-02-05 DIAGNOSIS — M7051 Other bursitis of knee, right knee: Secondary | ICD-10-CM

## 2018-02-05 NOTE — Telephone Encounter (Signed)
Please advise 

## 2018-02-05 NOTE — Addendum Note (Signed)
Addended by: Crecencio Mc on: 02/05/2018 03:41 PM   Modules accepted: Orders

## 2018-02-05 NOTE — Telephone Encounter (Signed)
  Your referral is in process as discussed. Our referral coordinator will call you when the appointment has been made.  °

## 2018-02-05 NOTE — Telephone Encounter (Signed)
Copied from [place] [id number]. Topic: Referral - Request >> Feb 05, 2018 10:54 AM Synthia Innocent wrote: Reason for CRM: Requesting referral to Dr Roland Rack @ [facility] for right knee pain

## 2018-02-05 NOTE — Telephone Encounter (Signed)
Patient notified and voiced understanding.

## 2018-03-06 ENCOUNTER — Other Ambulatory Visit: Payer: Self-pay | Admitting: Surgery

## 2018-03-06 DIAGNOSIS — S83231A Complex tear of medial meniscus, current injury, right knee, initial encounter: Secondary | ICD-10-CM | POA: Diagnosis not present

## 2018-03-06 DIAGNOSIS — M25461 Effusion, right knee: Secondary | ICD-10-CM

## 2018-03-06 DIAGNOSIS — S83271A Complex tear of lateral meniscus, current injury, right knee, initial encounter: Secondary | ICD-10-CM | POA: Insufficient documentation

## 2018-03-06 DIAGNOSIS — M25561 Pain in right knee: Secondary | ICD-10-CM | POA: Diagnosis not present

## 2018-03-13 ENCOUNTER — Other Ambulatory Visit: Payer: Self-pay | Admitting: Internal Medicine

## 2018-03-19 ENCOUNTER — Ambulatory Visit
Admission: RE | Admit: 2018-03-19 | Discharge: 2018-03-19 | Disposition: A | Discharge status: Home / Self Care | Payer: Medicare HMO | Source: Ambulatory Visit | Attending: Surgery | Admitting: Surgery

## 2018-03-19 DIAGNOSIS — M1711 Unilateral primary osteoarthritis, right knee: Secondary | ICD-10-CM | POA: Insufficient documentation

## 2018-03-19 DIAGNOSIS — M25561 Pain in right knee: Secondary | ICD-10-CM | POA: Diagnosis not present

## 2018-03-19 DIAGNOSIS — X58XXXA Exposure to other specified factors, initial encounter: Secondary | ICD-10-CM | POA: Insufficient documentation

## 2018-03-19 DIAGNOSIS — S83231A Complex tear of medial meniscus, current injury, right knee, initial encounter: Secondary | ICD-10-CM | POA: Insufficient documentation

## 2018-03-19 DIAGNOSIS — M25461 Effusion, right knee: Secondary | ICD-10-CM

## 2018-03-27 DIAGNOSIS — M25461 Effusion, right knee: Secondary | ICD-10-CM | POA: Diagnosis not present

## 2018-03-27 DIAGNOSIS — S83271D Complex tear of lateral meniscus, current injury, right knee, subsequent encounter: Secondary | ICD-10-CM | POA: Diagnosis not present

## 2018-03-31 ENCOUNTER — Other Ambulatory Visit: Payer: Self-pay

## 2018-03-31 ENCOUNTER — Encounter: Payer: Self-pay | Admitting: *Deleted

## 2018-04-08 ENCOUNTER — Ambulatory Visit
Admission: RE | Admit: 2018-04-08 | Discharge: 2018-04-08 | Disposition: A | Discharge status: Home / Self Care | Payer: Medicare HMO | Source: Ambulatory Visit | Attending: Surgery | Admitting: Surgery

## 2018-04-08 ENCOUNTER — Ambulatory Visit: Payer: Medicare HMO | Admitting: Anesthesiology

## 2018-04-08 ENCOUNTER — Encounter
Admission: RE | Disposition: A | Discharge status: Home / Self Care | Payer: Self-pay | Source: Ambulatory Visit | Attending: Surgery

## 2018-04-08 DIAGNOSIS — I10 Essential (primary) hypertension: Secondary | ICD-10-CM | POA: Insufficient documentation

## 2018-04-08 DIAGNOSIS — I739 Peripheral vascular disease, unspecified: Secondary | ICD-10-CM

## 2018-04-08 DIAGNOSIS — X58XXXA Exposure to other specified factors, initial encounter: Secondary | ICD-10-CM | POA: Diagnosis not present

## 2018-04-08 DIAGNOSIS — Z79899 Other long term (current) drug therapy: Secondary | ICD-10-CM | POA: Insufficient documentation

## 2018-04-08 DIAGNOSIS — Y929 Unspecified place or not applicable: Secondary | ICD-10-CM | POA: Insufficient documentation

## 2018-04-08 DIAGNOSIS — M6751 Plica syndrome, right knee: Secondary | ICD-10-CM | POA: Diagnosis not present

## 2018-04-08 DIAGNOSIS — E039 Hypothyroidism, unspecified: Secondary | ICD-10-CM | POA: Insufficient documentation

## 2018-04-08 DIAGNOSIS — S83279A Complex tear of lateral meniscus, current injury, unspecified knee, initial encounter: Secondary | ICD-10-CM | POA: Diagnosis not present

## 2018-04-08 DIAGNOSIS — I471 Supraventricular tachycardia: Secondary | ICD-10-CM

## 2018-04-08 DIAGNOSIS — S83271A Complex tear of lateral meniscus, current injury, right knee, initial encounter: Secondary | ICD-10-CM | POA: Diagnosis not present

## 2018-04-08 HISTORY — PX: KNEE ARTHROSCOPY WITH LATERAL MENISECTOMY: SHX6193

## 2018-04-08 HISTORY — DX: Other intervertebral disc degeneration, lumbar region: M51.36

## 2018-04-08 HISTORY — DX: Hypothyroidism, unspecified: E03.9

## 2018-04-08 HISTORY — DX: Other intervertebral disc degeneration, lumbar region without mention of lumbar back pain or lower extremity pain: M51.369

## 2018-04-08 SURGERY — ARTHROSCOPY, KNEE, WITH LATERAL MENISCECTOMY
Anesthesia: General | Laterality: Right | Wound class: Clean

## 2018-04-08 MED ORDER — DEXAMETHASONE SODIUM PHOSPHATE 4 MG/ML IJ SOLN
INTRAMUSCULAR | Status: DC | PRN
  Administered 2018-04-08: 4 mg via INTRAVENOUS

## 2018-04-08 MED ORDER — ACETAMINOPHEN 325 MG PO TABS: 325.0000 mg | ORAL_TABLET | ORAL | Status: DC | PRN

## 2018-04-08 MED ORDER — LACTATED RINGERS IV SOLN
INTRAVENOUS | Status: DC
  Administered 2018-04-08: 12:00:00 via INTRAVENOUS

## 2018-04-08 MED ORDER — EPHEDRINE SULFATE 50 MG/ML IJ SOLN
INTRAMUSCULAR | Status: DC | PRN
  Administered 2018-04-08: 10 mg via INTRAVENOUS

## 2018-04-08 MED ORDER — METOCLOPRAMIDE HCL 5 MG/ML IJ SOLN: 5.0000 mg | Freq: Three times a day (TID) | INTRAMUSCULAR | Status: DC | PRN

## 2018-04-08 MED ORDER — LACTATED RINGERS IV SOLN: INTRAVENOUS | Status: DC

## 2018-04-08 MED ORDER — LIDOCAINE HCL 1 % IJ SOLN
INTRAMUSCULAR | Status: DC | PRN
  Administered 2018-04-08: 30 mL via INTRAMUSCULAR

## 2018-04-08 MED ORDER — OXYCODONE HCL 5 MG/5ML PO SOLN: 5.0000 mg | Freq: Once | ORAL | Status: DC | PRN

## 2018-04-08 MED ORDER — ONDANSETRON HCL 4 MG/2ML IJ SOLN
INTRAMUSCULAR | Status: DC | PRN
  Administered 2018-04-08: 4 mg via INTRAVENOUS

## 2018-04-08 MED ORDER — DEXTROSE 5 % IV SOLN
2000.0000 mg | Freq: Once | INTRAVENOUS | Status: AC
  Administered 2018-04-08: 2000 mg via INTRAVENOUS

## 2018-04-08 MED ORDER — OXYCODONE HCL 5 MG PO TABS: 5.0000 mg | ORAL_TABLET | Freq: Once | ORAL | Status: DC | PRN

## 2018-04-08 MED ORDER — BUPIVACAINE-EPINEPHRINE (PF) 0.5% -1:200000 IJ SOLN
INTRAMUSCULAR | Status: DC | PRN
  Administered 2018-04-08: 30 mL

## 2018-04-08 MED ORDER — FENTANYL CITRATE (PF) 100 MCG/2ML IJ SOLN: 25.0000 ug | INTRAMUSCULAR | Status: DC | PRN

## 2018-04-08 MED ORDER — ACETAMINOPHEN 160 MG/5ML PO SOLN: 325.0000 mg | ORAL | Status: DC | PRN

## 2018-04-08 MED ORDER — MIDAZOLAM HCL 5 MG/5ML IJ SOLN
INTRAMUSCULAR | Status: DC | PRN
  Administered 2018-04-08: 2 mg via INTRAVENOUS

## 2018-04-08 MED ORDER — ONDANSETRON HCL 4 MG/2ML IJ SOLN: 4.0000 mg | Freq: Once | INTRAMUSCULAR | Status: DC | PRN

## 2018-04-08 MED ORDER — ONDANSETRON HCL 4 MG PO TABS: 4.0000 mg | ORAL_TABLET | Freq: Four times a day (QID) | ORAL | Status: DC | PRN

## 2018-04-08 MED ORDER — LIDOCAINE HCL (CARDIAC) PF 100 MG/5ML IV SOSY
PREFILLED_SYRINGE | INTRAVENOUS | Status: DC | PRN
  Administered 2018-04-08: 50 mg via INTRATRACHEAL

## 2018-04-08 MED ORDER — METOCLOPRAMIDE HCL 5 MG PO TABS: 5.0000 mg | ORAL_TABLET | Freq: Three times a day (TID) | ORAL | Status: DC | PRN

## 2018-04-08 MED ORDER — TRAMADOL HCL 50 MG PO TABS: 50.0000 mg | ORAL_TABLET | Freq: Four times a day (QID) | ORAL | 0 refills | Status: DC | PRN

## 2018-04-08 MED ORDER — ONDANSETRON HCL 4 MG/2ML IJ SOLN: 4.0000 mg | Freq: Four times a day (QID) | INTRAMUSCULAR | Status: DC | PRN

## 2018-04-08 MED ORDER — OXYCODONE HCL 5 MG PO TABS: 5.0000 mg | ORAL_TABLET | ORAL | Status: DC | PRN

## 2018-04-08 MED ORDER — PROPOFOL 10 MG/ML IV BOLUS
INTRAVENOUS | Status: DC | PRN
  Administered 2018-04-08: 30 mg via INTRAVENOUS
  Administered 2018-04-08: 150 mg via INTRAVENOUS

## 2018-04-08 MED ORDER — DEXMEDETOMIDINE HCL 200 MCG/2ML IV SOLN
INTRAVENOUS | Status: DC | PRN
  Administered 2018-04-08: 4 ug via INTRAVENOUS

## 2018-04-08 MED ORDER — POTASSIUM CHLORIDE IN NACL 20-0.9 MEQ/L-% IV SOLN: INTRAVENOUS | Status: DC

## 2018-04-08 SURGICAL SUPPLY — 27 items
BANDAGE ELASTIC 6 LF NS (GAUZE/BANDAGES/DRESSINGS) ×2 IMPLANT
BLADE FULL RADIUS 3.5 (BLADE) ×2 IMPLANT
BNDG CMPR MED 5X6 ELC HKLP NS (GAUZE/BANDAGES/DRESSINGS) ×1
CHLORAPREP W/TINT 26ML (MISCELLANEOUS) ×2 IMPLANT
COVER LIGHT HANDLE UNIVERSAL (MISCELLANEOUS) ×4 IMPLANT
CUFF TOURN SGL QUICK 30 (MISCELLANEOUS) ×2
CUFF TRNQT CYL LO 30X4X (MISCELLANEOUS) IMPLANT
DRAPE IMP U-DRAPE 54X76 (DRAPES) ×2 IMPLANT
GAUZE SPONGE 4X4 12PLY STRL (GAUZE/BANDAGES/DRESSINGS) ×2 IMPLANT
GLOVE BIO SURGEON STRL SZ8 (GLOVE) ×4 IMPLANT
GLOVE INDICATOR 8.0 STRL GRN (GLOVE) ×2 IMPLANT
GOWN STRL REUS W/ TWL LRG LVL3 (GOWN DISPOSABLE) ×1 IMPLANT
GOWN STRL REUS W/ TWL XL LVL3 (GOWN DISPOSABLE) ×1 IMPLANT
GOWN STRL REUS W/TWL LRG LVL3 (GOWN DISPOSABLE) ×2
GOWN STRL REUS W/TWL XL LVL3 (GOWN DISPOSABLE) ×2
IV LACTATED RINGER IRRG 3000ML (IV SOLUTION) ×2
IV LR IRRIG 3000ML ARTHROMATIC (IV SOLUTION) ×2 IMPLANT
KIT TURNOVER KIT A (KITS) ×2 IMPLANT
MANIFOLD 4PT FOR NEPTUNE1 (MISCELLANEOUS) ×2 IMPLANT
NDL HYPO 21X1.5 SAFETY (NEEDLE) ×2 IMPLANT
NEEDLE HYPO 21X1.5 SAFETY (NEEDLE) ×4 IMPLANT
PACK ARTHROSCOPY KNEE (MISCELLANEOUS) ×2 IMPLANT
STRAP BODY AND KNEE 60X3 (MISCELLANEOUS) ×2 IMPLANT
SUT PROLENE 4 0 PS 2 18 (SUTURE) ×2 IMPLANT
SYR 50ML LL SCALE MARK (SYRINGE) ×2 IMPLANT
TUBING ARTHRO INFLOW-ONLY STRL (TUBING) ×2 IMPLANT
WAND HAND CNTRL MULTIVAC 90 (MISCELLANEOUS) ×1 IMPLANT

## 2018-04-08 NOTE — Op Note (Signed)
04/08/2018  1:41 PM  Patient:   Wendy Love  Pre-Op Diagnosis:   Complex tear of lateral meniscus, right knee.  Postoperative diagnosis:   Same with symptomatic medial shelf plica, right knee.  Procedure:   Arthroscopic partial lateral meniscectomy and arthroscopic debridement with excision of symptom medic plica, right knee.  Surgeon:   Pascal Lux, M.D.  Anesthesia:   General LMA.  Findings:   As above. There were grade 1 chondromalacial changes involving the femoral trochlea and the medial and lateral femoral condyles. The medial meniscus demonstrated mild fraying posteromedially. The anterior and posterior cruciate ligaments both were in excellent condition.  Complications:   None.  EBL:   3 cc.  Total fluids:   450 cc of crystalloid.  Tourniquet time:   None  Drains:   None  Closure:   4-0 Prolene interrupted sutures.  Brief clinical note:   The patient is a 77 year old female with a several month history of pain and swelling involving the lateral aspect of her right knee. Her symptoms have persisted despite medications, activity modification, etc. Her history and examination are consistent with a lateral meniscus tear, confirmed by MRI scan. The patient presents at this time for arthroscopy, debridement, and partial lateral meniscectomy.  Procedure:   The patient was brought into the operating room and lain in the supine position. After adequate general laryngeal mask anesthesia was obtained, a timeout was performed to verify the appropriate side. The patient's right knee was injected sterilely using a solution of 30 cc of 1% lidocaine and 30 cc of 0.5% Sensorcaine with epinephrine. The right lower extremity was prepped with ChloraPrep solution before being draped sterilely. Preoperative antibiotics were administered. The expected portal sites were injected with 0.5% Sensorcaine with epinephrine before the camera was placed in the anterolateral portal and  instrumentation performed through the anteromedial portal. The knee was sequentially examined beginning in the suprapatellar pouch, then progressing to the patellofemoral space, the medial gutter compartment, the notch, and finally the lateral compartment and gutter. The findings were as described above. Abundant reactive synovial tissues anteriorly were debrided using the full-radius resector in order to improve visualization. During debridement, a thickened medial shelf plica was identified which appeared to be abrading the medial edge of the medial femoral condyle. This was excised in its entirety. The area of focal fraying along the posterior medial aspect of the medial meniscus also was debrided back to stable margins using the full-radius resector. Laterally, the areas of complex degenerative tearing involving the anterior and middle thirds of the lateral meniscus were debrided back to stable margins using the full-radius resector and baskets.  Subsequent probing of the remaining rim demonstrated good stability. The instruments were removed from the joint after suctioning the excess fluid. The portal sites were closed using 4-0 Prolene interrupted sutures before a sterile bulky dressing was applied to the knee. The patient was then awakened, extubated, and returned to the recovery room in satisfactory condition after tolerating the procedure well.

## 2018-04-08 NOTE — Anesthesia Procedure Notes (Signed)
Procedure Name: LMA Insertion Date/Time: 04/08/2018 12:32 PM Performed by: Janna Arch, CRNA Pre-anesthesia Checklist: Patient identified, Emergency Drugs available, Suction available and Patient being monitored Patient Re-evaluated:Patient Re-evaluated prior to induction Oxygen Delivery Method: Circle system utilized Preoxygenation: Pre-oxygenation with 100% oxygen Induction Type: IV induction Ventilation: Mask ventilation without difficulty LMA: LMA inserted LMA Size: 4.0 Number of attempts: 1 Tube secured with: Tape Dental Injury: Teeth and Oropharynx as per pre-operative assessment

## 2018-04-08 NOTE — Transfer of Care (Signed)
Immediate Anesthesia Transfer of Care Note  Patient: Wendy Love  Procedure(s) Performed: KNEE ARTHROSCOPY WITH PARTIAL LATERAL MENISECTOMY and debridement. (Right )  Patient Location: PACU  Anesthesia Type: General LMA  Level of Consciousness: awake, alert  and patient cooperative  Airway and Oxygen Therapy: Patient Spontanous Breathing and Patient connected to supplemental oxygen  Post-op Assessment: Post-op Vital signs reviewed, Patient's Cardiovascular Status Stable, Respiratory Function Stable, Patent Airway and No signs of Nausea or vomiting  Post-op Vital Signs: Reviewed and stable  Complications: No apparent anesthesia complications

## 2018-04-08 NOTE — Anesthesia Preprocedure Evaluation (Signed)
Anesthesia Evaluation  Patient identified by MRN, date of birth, ID band Patient awake    Reviewed: Allergy & Precautions, H&P , NPO status , Patient's Chart, lab work & pertinent test results  Airway Mallampati: I  TM Distance: >3 FB Neck ROM: full    Dental no notable dental hx.    Pulmonary    Pulmonary exam normal breath sounds clear to auscultation       Cardiovascular hypertension, Normal cardiovascular exam+ dysrhythmias  Rhythm:regular Rate:Normal     Neuro/Psych  Neuromuscular disease    GI/Hepatic   Endo/Other  Hypothyroidism   Renal/GU      Musculoskeletal   Abdominal   Peds  Hematology   Anesthesia Other Findings   Reproductive/Obstetrics                             Anesthesia Physical Anesthesia Plan  ASA: II  Anesthesia Plan: General LMA   Post-op Pain Management:    Induction:   PONV Risk Score and Plan: 3 and Ondansetron, Dexamethasone, Midazolam and Treatment may vary due to age or medical condition  Airway Management Planned:   Additional Equipment:   Intra-op Plan:   Post-operative Plan:   Informed Consent: I have reviewed the patients History and Physical, chart, labs and discussed the procedure including the risks, benefits and alternatives for the proposed anesthesia with the patient or authorized representative who has indicated his/her understanding and acceptance.     Plan Discussed with: CRNA  Anesthesia Plan Comments:         Anesthesia Quick Evaluation

## 2018-04-08 NOTE — Anesthesia Postprocedure Evaluation (Signed)
Anesthesia Post Note  Patient: Wendy Love  Procedure(s) Performed: KNEE ARTHROSCOPY WITH PARTIAL LATERAL MENISECTOMY and debridement. (Right )  Patient location during evaluation: PACU Anesthesia Type: General Level of consciousness: awake and alert and oriented Pain management: satisfactory to patient Vital Signs Assessment: post-procedure vital signs reviewed and stable Respiratory status: spontaneous breathing, nonlabored ventilation and respiratory function stable Cardiovascular status: blood pressure returned to baseline and stable Postop Assessment: Adequate PO intake and No signs of nausea or vomiting Anesthetic complications: no    Raliegh Ip

## 2018-04-08 NOTE — Discharge Instructions (Signed)
General Anesthesia, Adult, Care After These instructions provide you with information about caring for yourself after your procedure. Your health care provider may also give you more specific instructions. Your treatment has been planned according to current medical practices, but problems sometimes occur. Call your health care provider if you have any problems or questions after your procedure. What can I expect after the procedure? After the procedure, it is common to have:  Vomiting.  A sore throat.  Mental slowness.  It is common to feel:  Nauseous.  Cold or shivery.  Sleepy.  Tired.  Sore or achy, even in parts of your body where you did not have surgery.  Follow these instructions at home: For at least 24 hours after the procedure:  Do not: ? Participate in activities where you could fall or become injured. ? Drive. ? Use heavy machinery. ? Drink alcohol. ? Take sleeping pills or medicines that cause drowsiness. ? Make important decisions or sign legal documents. ? Take care of children on your own.  Rest. Eating and drinking  If you vomit, drink water, juice, or soup when you can drink without vomiting.  Drink enough fluid to keep your urine clear or pale yellow.  Make sure you have little or no nausea before eating solid foods.  Follow the diet recommended by your health care provider. General instructions  Have a responsible adult stay with you until you are awake and alert.  Return to your normal activities as told by your health care provider. Ask your health care provider what activities are safe for you.  Take over-the-counter and prescription medicines only as told by your health care provider.  If you smoke, do not smoke without supervision.  Keep all follow-up visits as told by your health care provider. This is important. Contact a health care provider if:  You continue to have nausea or vomiting at home, and medicines are not helpful.  You  cannot drink fluids or start eating again.  You cannot urinate after 8-12 hours.  You develop a skin rash.  You have fever.  You have increasing redness at the site of your procedure. Get help right away if:  You have difficulty breathing.  You have chest pain.  You have unexpected bleeding.  You feel that you are having a life-threatening or urgent problem. This information is not intended to replace advice given to you by your health care provider. Make sure you discuss any questions you have with your health care provider. Document Released: 12/02/2000 Document Revised: 01/29/2016 Document Reviewed: 08/10/2015 Elsevier Interactive Patient Education  2018 [place] discharge instructions: Keep dressing dry and intact.  May shower after dressing changed on post-op day #4 (Sunday).  Cover sutures with Band-Aids after drying off. Apply ice frequently to knee. Take ibuprofen 600 mg TID with meals for 7-10 days, then as necessary. Take pain medication as prescribed or ES Tylenol when needed.  May weight-bear as tolerated - use crutches or walker as needed. Follow-up in 10-14 days or as scheduled.

## 2018-04-08 NOTE — H&P (Signed)
Paper H&P to be scanned into permanent record. H&P reviewed and patient re-examined. No changes. 

## 2018-04-10 DIAGNOSIS — M6751 Plica syndrome, right knee: Secondary | ICD-10-CM | POA: Insufficient documentation

## 2018-05-12 DIAGNOSIS — H40003 Preglaucoma, unspecified, bilateral: Secondary | ICD-10-CM | POA: Diagnosis not present

## 2018-05-21 DIAGNOSIS — Z85828 Personal history of other malignant neoplasm of skin: Secondary | ICD-10-CM | POA: Diagnosis not present

## 2018-05-21 DIAGNOSIS — Z08 Encounter for follow-up examination after completed treatment for malignant neoplasm: Secondary | ICD-10-CM | POA: Diagnosis not present

## 2018-05-21 DIAGNOSIS — D2272 Melanocytic nevi of left lower limb, including hip: Secondary | ICD-10-CM | POA: Diagnosis not present

## 2018-05-21 DIAGNOSIS — D2261 Melanocytic nevi of right upper limb, including shoulder: Secondary | ICD-10-CM | POA: Diagnosis not present

## 2018-05-21 DIAGNOSIS — D2271 Melanocytic nevi of right lower limb, including hip: Secondary | ICD-10-CM | POA: Diagnosis not present

## 2018-05-21 DIAGNOSIS — D225 Melanocytic nevi of trunk: Secondary | ICD-10-CM | POA: Diagnosis not present

## 2018-05-21 DIAGNOSIS — D2262 Melanocytic nevi of left upper limb, including shoulder: Secondary | ICD-10-CM | POA: Diagnosis not present

## 2018-05-21 DIAGNOSIS — L821 Other seborrheic keratosis: Secondary | ICD-10-CM | POA: Diagnosis not present

## 2018-05-27 DIAGNOSIS — R69 Illness, unspecified: Secondary | ICD-10-CM | POA: Diagnosis not present

## 2018-07-01 ENCOUNTER — Ambulatory Visit: Payer: Medicare HMO | Admitting: Internal Medicine

## 2018-07-03 ENCOUNTER — Other Ambulatory Visit: Payer: Self-pay | Admitting: Internal Medicine

## 2018-07-03 DIAGNOSIS — I10 Essential (primary) hypertension: Secondary | ICD-10-CM

## 2018-07-13 ENCOUNTER — Other Ambulatory Visit: Payer: Self-pay | Admitting: Internal Medicine

## 2018-08-12 ENCOUNTER — Ambulatory Visit (INDEPENDENT_AMBULATORY_CARE_PROVIDER_SITE_OTHER): Payer: Medicare HMO | Admitting: Internal Medicine

## 2018-08-12 ENCOUNTER — Encounter: Payer: Self-pay | Admitting: Internal Medicine

## 2018-08-12 VITALS — BP 160/76 | HR 83 | Temp 98.5°F | Resp 14 | Ht 63.0 in | Wt 136.4 lb

## 2018-08-12 DIAGNOSIS — I150 Renovascular hypertension: Secondary | ICD-10-CM | POA: Diagnosis not present

## 2018-08-12 DIAGNOSIS — M5441 Lumbago with sciatica, right side: Secondary | ICD-10-CM | POA: Diagnosis not present

## 2018-08-12 DIAGNOSIS — R7301 Impaired fasting glucose: Secondary | ICD-10-CM | POA: Diagnosis not present

## 2018-08-12 DIAGNOSIS — I10 Essential (primary) hypertension: Secondary | ICD-10-CM | POA: Diagnosis not present

## 2018-08-12 DIAGNOSIS — Z1239 Encounter for other screening for malignant neoplasm of breast: Secondary | ICD-10-CM

## 2018-08-12 DIAGNOSIS — I739 Peripheral vascular disease, unspecified: Secondary | ICD-10-CM | POA: Diagnosis not present

## 2018-08-12 DIAGNOSIS — E7800 Pure hypercholesterolemia, unspecified: Secondary | ICD-10-CM | POA: Diagnosis not present

## 2018-08-12 DIAGNOSIS — E785 Hyperlipidemia, unspecified: Secondary | ICD-10-CM

## 2018-08-12 DIAGNOSIS — E034 Atrophy of thyroid (acquired): Secondary | ICD-10-CM | POA: Diagnosis not present

## 2018-08-12 DIAGNOSIS — G8929 Other chronic pain: Secondary | ICD-10-CM

## 2018-08-12 LAB — COMPREHENSIVE METABOLIC PANEL
ALT: 18 U/L (ref 0–35)
AST: 23 U/L (ref 0–37)
Albumin: 4.4 g/dL (ref 3.5–5.2)
Alkaline Phosphatase: 65 U/L (ref 39–117)
BUN: 14 mg/dL (ref 6–23)
CO2: 28 mEq/L (ref 19–32)
Calcium: 9.8 mg/dL (ref 8.4–10.5)
Chloride: 98 mEq/L (ref 96–112)
Creatinine, Ser: 0.79 mg/dL (ref 0.40–1.20)
GFR: 74.77 mL/min (ref >60.00)
GLUCOSE: 104 mg/dL — AB (ref 70–99)
Potassium: 4.4 mEq/L (ref 3.5–5.1)
Sodium: 135 mEq/L (ref 135–145)
Total Bilirubin: 0.4 mg/dL (ref 0.2–1.2)
Total Protein: 7 g/dL (ref 6.0–8.3)

## 2018-08-12 LAB — LDL CHOLESTEROL, DIRECT: Direct LDL: 115 mg/dL

## 2018-08-12 LAB — HEMOGLOBIN A1C: Hgb A1c MFr Bld: 5.1 % (ref 4.6–6.5)

## 2018-08-12 LAB — TSH: TSH: 4.8 u[IU]/mL — ABNORMAL HIGH (ref 0.35–4.50)

## 2018-08-12 NOTE — Progress Notes (Signed)
Subjective:  Patient ID: Wendy Love, female    DOB: 07/21/1941  Age: 77 y.o. MRN: [id number]  CC: The primary encounter diagnosis was Renovascular hypertension. Diagnoses of Hypothyroidism due to acquired atrophy of thyroid, Impaired fasting glucose, Breast cancer screening, Essential hypertension, Peripheral artery disease ([place]), Pure hypercholesterolemia, Autosomal dominant hereditary hemochromatosis ([place]), Hyperlipidemia with target LDL less than 100, and Chronic right-sided low back pain with right-sided sciatica were also pertinent to this visit.  HPI Wendy Love presents for 6 month follow up on chronic conditions including HTN , PAD (possible FMD of renal artery)  Hypertension: patient has not checked blood pressure lately Patient Iss followin a reduced salt diet most days and is taking medications as prescribed .  She is unaware of the renal artery findings on the CT angiogram that was done last year by Dr Fletcher Anon to evaluate her symptoms of claudication.  This was reviewed with her today and a vascular consult was accepted.     Had right knee meniscal repair surgery in September . Has recovered completely.   Weight gain,  Worried about hte increase in abdominal fat noted over the last  3-4 months .  Insomnia:  Still trying to get off  Sleep meds.using nonpharmacologic means   Needs mammogram last one was done in  2016 , prior to husband's diagnosis of cancer .   Outpatient Medications Prior to Visit  Medication Sig Dispense Refill  . amLODipine (NORVASC) 2.5 MG tablet TAKE 1 TABLET BY MOUTH EVERY DAY 90 tablet 1  . b complex vitamins tablet Take 1 tablet by mouth daily.      . Cholecalciferol (VITAMIN D3) 2000 UNITS TABS Take 5,000 Units by mouth daily.     [name] levothyroxine (SYNTHROID, LEVOTHROID) 50 MCG tablet TAKE 1 TABLET BY MOUTH EVERY DAY 90 tablet 1  . losartan (COZAAR) 50 MG tablet TAKE 2 TABLETS BY MOUTH EVERYDAY 180 tablet 1  . Magnesium 300 MG CAPS Take  by mouth daily.    . Multiple Vitamin (MULTIVITAMIN) tablet Take 1 tablet by mouth daily.    . Omega-3 Fatty Acids (FISH OIL PO) Take by mouth daily.    [name] PROBIOTIC CAPS Take by mouth daily.      . temazepam (RESTORIL) 15 MG capsule Take 1 capsule (15 mg total) by mouth at bedtime. 30 capsule 5  . tretinoin (RETIN-A) 0.025 % cream APPLY TO WHOLE FACE EACH NIGHT.  5  . Triamcinolone Acetonide (NASACORT ALLERGY 24HR NA) Place 1 spray into the nose daily as needed.     . TURMERIC PO Take 1 capsule by mouth daily.    . vitamin C (ASCORBIC ACID) 500 MG tablet Take 500 mg by mouth daily.    . COLLAGEN PO Take by mouth daily.    [name] MILK THISTLE PO Take by mouth daily.    . traMADol (ULTRAM) 50 MG tablet Take 1-2 tablets (50-100 mg total) by mouth every 6 (six) hours as needed for moderate pain. (Patient not taking: Reported on 08/12/2018) 40 tablet 0   No facility-administered medications prior to visit.     Review of Systems;  Patient denies headache, fevers, malaise, unintentional weight loss, skin rash, eye pain, sinus congestion and sinus pain, sore throat, dysphagia,  hemoptysis , cough, dyspnea, wheezing, chest pain, palpitations, orthopnea, edema, abdominal pain, nausea, melena, diarrhea, constipation, flank pain, dysuria, hematuria, urinary  Frequency, nocturia, numbness, tingling, seizures,  Focal weakness, Loss of consciousness,  Tremor, insomnia, depression, anxiety, and suicidal ideation.  Objective:  BP (!) 160/76 (BP Location: Left Arm, Patient Position: Sitting, Cuff Size: Normal)   Pulse 83   Temp 98.5 F (36.9 C) (Oral)   Resp 14   Ht 5\' 3"  (1.6 m)   Wt 136 lb 6.4 oz (61.9 kg)   SpO2 98%   BMI 24.16 kg/m   BP Readings from Last 3 Encounters:  08/12/18 (!) 160/76  04/08/18 (!) 151/92  01/23/18 (!) 150/74    Wt Readings from Last 3 Encounters:  08/12/18 136 lb 6.4 oz (61.9 kg)  04/08/18 129 lb (58.5 kg)  01/23/18 131 lb 6.4 oz (59.6 kg)    General appearance:  alert, cooperative and appears stated age Ears: normal TM's and external ear canals both ears Throat: lips, mucosa, and tongue normal; teeth and gums normal Neck: no adenopathy, no carotid bruit, supple, symmetrical, trachea midline and thyroid not enlarged, symmetric, no tenderness/mass/nodules Back: symmetric, no curvature. ROM normal. No CVA tenderness. Lungs: clear to auscultation bilaterally Heart: regular rate and rhythm, S1, S2 normal, no murmur, click, rub or gallop Abdomen: soft, non-tender; bowel sounds normal; no masses,  no organomegaly Pulses: 2+ and symmetric Skin: Skin color, texture, turgor normal. No rashes or lesions Lymph nodes: Cervical, supraclavicular, and axillary nodes normal.  Lab Results  Component Value Date   HGBA1C 5.1 08/12/2018   HGBA1C 5.1 07/04/2015   HGBA1C 5.0 06/01/2014    Lab Results  Component Value Date   CREATININE 0.79 08/12/2018   CREATININE 0.73 12/30/2017   CREATININE 0.67 07/18/2017    Lab Results  Component Value Date   WBC 4.9 12/30/2017   HGB 14.8 12/30/2017   HCT 42.1 12/30/2017   PLT 252.0 12/30/2017   GLUCOSE 104 (H) 08/12/2018   CHOL 213 (H) 01/09/2018   TRIG 70.0 01/09/2018   HDL 96.60 01/09/2018   LDLDIRECT 115.0 08/12/2018   LDLCALC 102 (H) 01/09/2018   ALT 18 08/12/2018   AST 23 08/12/2018   NA 135 08/12/2018   K 4.4 08/12/2018   CL 98 08/12/2018   CREATININE 0.79 08/12/2018   BUN 14 08/12/2018   CO2 28 08/12/2018   TSH 4.80 (H) 08/12/2018   HGBA1C 5.1 08/12/2018   MICROALBUR 0.2 11/26/2013    No results found.  Assessment & Plan:   Problem List Items Addressed This Visit    Autosomal dominant hereditary hemochromatosis ([place])    She deferred hematology referral in April.  Repeat iron and CBC needed   Last sat was 51%   Lab Results  Component Value Date   IRON 151 12/30/2017   TIBC 292 12/30/2017   FERRITIN 33 12/30/2017   Lab Results  Component Value Date   WBC 4.9 12/30/2017   HGB 14.8  12/30/2017   HCT 42.1 12/30/2017   MCV 94.9 12/30/2017   PLT 252.0 12/30/2017         Relevant Orders   Iron, TIBC and Ferritin Panel   CBC with Differential/Platelet   Essential hypertension    Last year's CTA angiogram suggests she may have renovascular hypertension and vascular evaluation is needed to rule out RAS on the right      Hyperlipidemia    Currently Untreated due to statin intolerance.  Given her known atherosclerosis,  Trial of Zetia recommended for Direct LDL 115 today        Hypothyroidism     thyroid is slightly on your current levothyroxine dose; I have sent a higher dose to your pharmacy and would like you to start  it .  We will need to repeat your thryoid function after a minimum of 6 weeks. \  Lab Results  Component Value Date   TSH 4.80 (H) 08/12/2018         Relevant Orders   TSH (Completed)   Impaired fasting glucose    A1c is normal.  She is not at risk .       Relevant Orders   Hemoglobin A1c (Completed)   Direct LDL (Completed)   Peripheral artery disease ([place])    She has significant runoff disease bilaterally and diffuse atherosclerosis  By CTA done in Nov 2018.  She was strongly advised to start statin therapy and add Zetia if she could not reach goal.  Her LDL improved from 152 to 102 but she did not tolerate atorvastatin due to myalgias. Direct LDL today is 15.  I have recommended Zetia and aspirin..   Lab Results  Component Value Date   CHOL 213 (H) 01/09/2018   HDL 96.60 01/09/2018   LDLCALC 102 (H) 01/09/2018   LDLDIRECT 115.0 08/12/2018   TRIG 70.0 01/09/2018   CHOLHDL 2 01/09/2018   .        Right-sided low back pain with right-sided sciatica    Complete loss of disk space noted on CTA done last Nov at L4-5 and L5-S1       Other Visit Diagnoses    Renovascular hypertension    -  Primary   Relevant Orders   Ambulatory referral to Vascular Surgery   Comprehensive metabolic panel (Completed)   Breast cancer screening        Relevant Orders   MM 3D SCREEN BREAST BILATERAL   Hyperlipidemia with target LDL less than 100       Relevant Orders   Lipid panel   Comprehensive metabolic panel      I have discontinued Jaquelyn W. O'Reilly "Polly"'s traMADol. I am also having her maintain her b complex vitamins, Vitamin D3, Probiotic, multivitamin, Triamcinolone Acetonide (NASACORT ALLERGY 24HR NA), vitamin C, temazepam, TURMERIC PO, levothyroxine, Magnesium, Omega-3 Fatty Acids (FISH OIL PO), MILK THISTLE PO, COLLAGEN PO, amLODipine, losartan, and tretinoin.  No orders of the defined types were placed in this encounter.   Medications Discontinued During This Encounter  Medication Reason  . traMADol (ULTRAM) 50 MG tablet Allergic reaction    Follow-up: Return for annual .   Crecencio Mc, MD

## 2018-08-12 NOTE — Patient Instructions (Addendum)
Check your BP daily for one week ,  Send me readings   If most readings are < 140/80 we will increase the amlodipine dose to 5 mg daily  In the morning  Referral to Vascular for evaluation of renal arteries   Your annual mammogram has been ordered.  You are encouraged  to call to make your appointment at [place] [phone]

## 2018-08-13 DIAGNOSIS — R7301 Impaired fasting glucose: Secondary | ICD-10-CM | POA: Insufficient documentation

## 2018-08-13 NOTE — Assessment & Plan Note (Signed)
Currently Untreated due to statin intolerance.  Given her known atherosclerosis,  Trial of Zetia recommended for Direct LDL 115 today

## 2018-08-13 NOTE — Assessment & Plan Note (Addendum)
She deferred hematology referral in April.  Repeat iron and CBC needed   Last sat was 51%   Lab Results  Component Value Date   IRON 151 12/30/2017   TIBC 292 12/30/2017   FERRITIN 33 12/30/2017   Lab Results  Component Value Date   WBC 4.9 12/30/2017   HGB 14.8 12/30/2017   HCT 42.1 12/30/2017   MCV 94.9 12/30/2017   PLT 252.0 12/30/2017

## 2018-08-13 NOTE — Assessment & Plan Note (Signed)
Complete loss of disk space noted on CTA done last Nov at L4-5 and L5-S1

## 2018-08-13 NOTE — Assessment & Plan Note (Signed)
A1c is normal.  She is not at risk .

## 2018-08-13 NOTE — Assessment & Plan Note (Signed)
Last year's CTA angiogram suggests she may have renovascular hypertension and vascular evaluation is needed to rule out RAS on the right

## 2018-08-13 NOTE — Assessment & Plan Note (Signed)
thyroid is slightly on your current levothyroxine dose; I have sent a higher dose to your pharmacy and would like you to start it .  We will need to repeat your thryoid function after a minimum of 6 weeks. \  Lab Results  Component Value Date   TSH 4.80 (H) 08/12/2018

## 2018-08-13 NOTE — Assessment & Plan Note (Addendum)
She has significant runoff disease bilaterally and diffuse atherosclerosis  By CTA done in Nov 2018.  She was strongly advised to start statin therapy and add Zetia if she could not reach goal.  Her LDL improved from 152 to 102 but she did not tolerate atorvastatin due to myalgias. Direct LDL today is 15.  I have recommended Zetia and aspirin..   Lab Results  Component Value Date   CHOL 213 (H) 01/09/2018   HDL 96.60 01/09/2018   LDLCALC 102 (H) 01/09/2018   LDLDIRECT 115.0 08/12/2018   TRIG 70.0 01/09/2018   CHOLHDL 2 01/09/2018   .

## 2018-08-17 ENCOUNTER — Telehealth: Payer: Self-pay | Admitting: *Deleted

## 2018-08-17 MED ORDER — LEVOTHYROXINE SODIUM 75 MCG PO TABS: 75.0000 ug | ORAL_TABLET | Freq: Every day | ORAL | 1 refills | Status: DC

## 2018-08-17 NOTE — Telephone Encounter (Signed)
Copied from [place] [id number]. Topic: General - Other >> Aug 17, 2018  9:26 AM Sheran Luz wrote: Reason for CRM: See mychart message(s) from 12/5. Patient is calling to check status of these medications sent. States pharmacy has not received them. Patient also wanted to note that she has scheduled a vascular appointment as suggested.

## 2018-08-17 NOTE — Telephone Encounter (Signed)
PCP advised in lab note sending higher dose of levothyroxine, patient currently taking 50 mcg. New dosage not at pharmacy .

## 2018-08-17 NOTE — Telephone Encounter (Signed)
75 mcg dose of levothyroxine sent

## 2018-08-18 ENCOUNTER — Encounter (INDEPENDENT_AMBULATORY_CARE_PROVIDER_SITE_OTHER): Payer: Self-pay | Admitting: Vascular Surgery

## 2018-08-18 ENCOUNTER — Ambulatory Visit (INDEPENDENT_AMBULATORY_CARE_PROVIDER_SITE_OTHER): Payer: Medicare HMO

## 2018-08-18 ENCOUNTER — Encounter

## 2018-08-18 ENCOUNTER — Ambulatory Visit (INDEPENDENT_AMBULATORY_CARE_PROVIDER_SITE_OTHER): Payer: Medicare HMO | Admitting: Vascular Surgery

## 2018-08-18 ENCOUNTER — Other Ambulatory Visit (INDEPENDENT_AMBULATORY_CARE_PROVIDER_SITE_OTHER): Payer: Self-pay | Admitting: Vascular Surgery

## 2018-08-18 VITALS — BP 193/90 | HR 81 | Resp 16 | Ht 63.0 in | Wt 132.0 lb

## 2018-08-18 DIAGNOSIS — I773 Arterial fibromuscular dysplasia: Secondary | ICD-10-CM | POA: Insufficient documentation

## 2018-08-18 DIAGNOSIS — I739 Peripheral vascular disease, unspecified: Secondary | ICD-10-CM

## 2018-08-18 DIAGNOSIS — I150 Renovascular hypertension: Secondary | ICD-10-CM | POA: Diagnosis not present

## 2018-08-18 DIAGNOSIS — I701 Atherosclerosis of renal artery: Secondary | ICD-10-CM

## 2018-08-18 NOTE — Telephone Encounter (Signed)
Called to advise patient medication called to pharmacy, patient has picked up medication. Patient wanted to advise PCP she aw Dr. Lucky Cowboy on 08/17/18 and she has return appointment scheduled with him 10/29/18. FYI

## 2018-08-18 NOTE — Assessment & Plan Note (Signed)
Tibial disease seen on CT scan which I have independently reviewed.  No limb threatening or lifestyle limiting symptoms at current.  That study was a year ago so I think we can check ABIs at her convenience.

## 2018-08-18 NOTE — Assessment & Plan Note (Signed)
Blood pressure control is now suboptimal on 2 medications.  The patient is going to increase 1 of her medications in the near future and would like to see how that does before proceeding with any intervention which is certainly reasonable.  I will plan to see her back after the first of the year to determine further treatment options.  As it has been a year since her CT scan, we will do a duplex at that time.

## 2018-08-18 NOTE — Patient Instructions (Signed)
Renal Artery Stenosis Renal artery stenosis (RAS) is narrowing of the artery that carries blood to your kidneys. It can affect one or both kidneys. Your kidneys filter waste and extra fluid from your blood. You get rid of the waste and fluid when you urinate. Your kidneys also make an important chemical messenger (hormone) called renin. Renin helps regulate your blood pressure. The first sign of RAS may be high blood pressure. Over time, other symptoms can develop. What are the causes? Plaque buildup in your arteries (atherosclerosis) is the main cause of RAS. The plaques that cause this are made up of:  Fat.  Cholesterol.  Calcium.  Other substances.  As these substances build up in your renal artery, this slows the blood supply to your kidneys. The lack of blood and oxygen causes the signs and symptoms of RAS. A much less common cause of RAS is a disease called fibromuscular dysplasia. This disease causes abnormal cell growth that narrows the renal artery. It is not related to atherosclerosis. It occurs mostly in women who are 25-50 years old. It may be passed down through families. What increases the risk? You may be at risk for renal artery stenosis if you:  Are a man who is at least 78 years old.  Are a woman who is at least 78 years old.  Have high blood pressure.  Have high cholesterol.  Are a smoker.  Abuse alcohol.  Have diabetes or prediabetes.  Are overweight.  Have a family history of early heart disease.  What are the signs or symptoms? RAS usually develops slowly. You may not have any signs or symptoms at first. The earliest signs may be:  Developing high blood pressure.  A sudden increase in existing high blood pressure.  No longer responding to medicine that used to control your blood pressure.  Later signs and symptoms are due to kidney damage. They may include:  Fatigue.  Shortness of breath.  Swollen legs and feet.  Dry  skin.  Headaches.  Muscle cramps.  Loss of appetite.  Nausea or vomiting.  How is this diagnosed? Your health care provider may suspect RAS based on changes in your blood pressure and your risk factors. A physical exam will be done. Your health care provider may use a stethoscope to listen for a whooshing sound (bruit) that can occur where the renal artery is blocking blood flow. Several tests may be done to confirm a diagnosis of RAS. These may include:  Blood and urine tests to check your kidney function.  Imaging tests of your kidneys, such as: ? A test that involves using sound waves to create an image of your kidneys and the blood flow to your kidneys (ultrasound). ? A test in which dye is injected into one of your blood vessels so images can be taken as the dye flows through your renal arteries (angiogram). These tests can be done using X-rays, a CT scan (computed tomography angiogram, CTA), or a type of MRI (magnetic resonance angiogram, MRA).  How is this treated? Making lifestyle changes to reduce your risk factors is the first treatment option for early RAS. If the blood flow to one of your kidneys is cut by more than half, you may need medicine to:  Lower your blood pressure. This is the main medical treatment for RAS. You may need more than one type of medicine for this. The two types that work best for RAS are: ? ACE inhibitors. ? Angiotensin receptor blockers.  Reduce fluid   in the body (diuretics).  Lower your cholesterol (statins).  If medicine is not enough to control RAS, you may need surgery. This may involve:  Threading a tube with an inflatable balloon into the renal artery to force it open (angioplasty).  Removing plaque from inside the artery (endarterectomy).  Follow these instructions at home:  Take medicines only as directed by your health care provider.  Make any lifestyle changes recommended by your health care provider. This may include: ? Working  with a dietitian to maintain a heart-healthy diet. This type of diet is low in saturated fat, salt, and added sugar. ? Starting an exercise program as directed by your health care provider. ? Maintaining a healthy weight. ? Quitting smoking. ? Not abusing alcohol.  Keep all follow-up visits as directed by your health care provider. This is important. Contact a health care provider if:  Your symptoms of RAS are not getting better.  Your symptoms are changing or getting worse. Get help right away if:  You have very bad pain in your back or abdomen.  You have blood in your urine. This information is not intended to replace advice given to you by your health care provider. Make sure you discuss any questions you have with your health care provider. Document Released: 05/22/2005 Document Revised: 02/01/2016 Document Reviewed: 12/09/2013 Elsevier Interactive Patient Education  2018 [employer]  

## 2018-08-18 NOTE — Progress Notes (Signed)
Patient ID: Wendy Love, female   DOB: 07-Jan-1941, 77 y.o.   MRN: [id number]  Chief Complaint  Patient presents with  . New Patient (Initial Visit)    Renal consult    HPI Wendy Love is a 77 y.o. female.  I am asked to see the patient by Dr. Derrel Nip for evaluation of renal artery disease and renovascular HTN.  The patient reports having longstanding high blood pressure that was reasonably well controlled for over a decade.  Here recently, her blood pressure has become more onerous and difficult to control.  She reports having to add a second medication and her blood pressures are still mostly running in the 160s.  As far she knows, her renal function is okay.  She reports no pain.  No fever or chills or signs of infection.  She underwent a CT scan for claudication symptoms a year ago.  On that CT scan she had report of likely fibromuscular dysplasia of the right renal artery.  It is difficult to discern how much stenosis is present associated with this, but I would agree that the artery looks quite irregular.  There was also some tibial artery peripheral arterial disease although by enlarge her flow was well maintained.  She was having more claudication symptoms last year but says it current her legs really are not bothering her that much.   Past Medical History:  Diagnosis Date  . Anxiety   . Bronchitis   . Degenerative disc disease, lumbar   . Depression   . Endometriosis 1974   s/p abdominal surgery, 2nd surgery  . High cholesterol   . Hypertension   . Hypothyroidism   . Rhinosinusitis   . Sciatica     Past Surgical History:  Procedure Laterality Date  . ABDOMINAL HYSTERECTOMY  2000  . APPENDECTOMY    . CARPAL TUNNEL RELEASE     bilateral  . Endoscopy/colonoscopy  April 2012   Schatski's Ring , small hiatal hernia  . hemilaminectomy  April 2010   L3-L4hemi, L4-L5 microdiskectomy  . KNEE ARTHROSCOPY WITH LATERAL MENISECTOMY Right 04/08/2018   Procedure:  KNEE ARTHROSCOPY WITH PARTIAL LATERAL MENISECTOMY and debridement.;  Surgeon: Corky Mull, MD;  Location: [facility];  Service: Orthopedics;  Laterality: Right;    Family History  Problem Relation Age of Onset  . Ovarian cancer Mother   . Cancer Mother 63       ovarian cancer, treated by [facility]  . Heart disease Father   No bleeding or clotting disorders  Social History Social History   Tobacco Use  . Smoking status: Never Smoker  . Smokeless tobacco: Never Used  . Tobacco comment: smoked socially in college  Substance Use Topics  . Alcohol use: No  . Drug use: No     Allergies  Allergen Reactions  . Atorvastatin Other (See Comments)    myalgias  . Brompheniramine-Pseudoeph     REACTION: agitation, nervous, sleepless  . Codeine     REACTION: GI Upset  . Diphenhydramine Hcl     REACTION: nervous, sleepless, agitated  . Loratadine     REACTION: nervous, sleeplessness  . Tramadol Hcl Rash    hyperactivity    Current Outpatient Medications  Medication Sig Dispense Refill  . amLODipine (NORVASC) 2.5 MG tablet TAKE 1 TABLET BY MOUTH EVERY DAY 90 tablet 1  . b complex vitamins tablet Take 1 tablet by mouth daily.      . Cholecalciferol (VITAMIN D3) 2000 UNITS TABS  Take 5,000 Units by mouth daily.     [name] levothyroxine (SYNTHROID, LEVOTHROID) 75 MCG tablet Take 1 tablet (75 mcg total) by mouth daily. 90 tablet 1  . losartan (COZAAR) 50 MG tablet TAKE 2 TABLETS BY MOUTH EVERYDAY 180 tablet 1  . Magnesium 300 MG CAPS Take by mouth daily.    . Multiple Vitamin (MULTIVITAMIN) tablet Take 1 tablet by mouth daily.    . Omega-3 Fatty Acids (FISH OIL PO) Take by mouth daily.    [name] PROBIOTIC CAPS Take by mouth daily.      [name] tretinoin (RETIN-A) 0.025 % cream APPLY TO WHOLE FACE EACH NIGHT.  5  . vitamin C (ASCORBIC ACID) 500 MG tablet Take 500 mg by mouth daily.    . COLLAGEN PO Take by mouth daily.    [name] MILK THISTLE PO Take by mouth daily.    . temazepam (RESTORIL) 15 MG  capsule Take 1 capsule (15 mg total) by mouth at bedtime. (Patient not taking: Reported on 08/18/2018) 30 capsule 5  . Triamcinolone Acetonide (NASACORT ALLERGY 24HR NA) Place 1 spray into the nose daily as needed.     . TURMERIC PO Take 1 capsule by mouth daily.     No current facility-administered medications for this visit.       REVIEW OF SYSTEMS (Negative unless checked)  Constitutional: [] Weight loss  [] Fever  [] Chills Cardiac: [] Chest pain   [] Chest pressure   [] Palpitations   [] Shortness of breath when laying flat   [] Shortness of breath at rest   [] Shortness of breath with exertion. Vascular:  [x] Pain in legs with walking   [] Pain in legs at rest   [] Pain in legs when laying flat   [x] Claudication   [] Pain in feet when walking  [] Pain in feet at rest  [] Pain in feet when laying flat   [] History of DVT   [] Phlebitis   [] Swelling in legs   [] Varicose veins   [] Non-healing ulcers Pulmonary:   [] Uses home oxygen   [] Productive cough   [] Hemoptysis   [] Wheeze  [] COPD   [] Asthma Neurologic:  [] Dizziness  [] Blackouts   [] Seizures   [] History of stroke   [] History of TIA  [] Aphasia   [] Temporary blindness   [] Dysphagia   [] Weakness or numbness in arms   [] Weakness or numbness in legs Musculoskeletal:  [x] Arthritis   [] Joint swelling   [] Joint pain   [] Low back pain Hematologic:  [] Easy bruising  [] Easy bleeding   [] Hypercoagulable state   [] Anemic  [] Hepatitis Gastrointestinal:  [] Blood in stool   [] Vomiting blood  [] Gastroesophageal reflux/heartburn   [] Abdominal pain Genitourinary:  [] Chronic kidney disease   [] Difficult urination  [] Frequent urination  [] Burning with urination   [] Hematuria Skin:  [] Rashes   [] Ulcers   [] Wounds Psychological:  [] History of anxiety   []  History of major depression.    Physical Exam BP (!) 193/90 (BP Location: Left Arm, Patient Position: Sitting)   Pulse 81   Resp 16   Ht 5\' 3"  (1.6 m)   Wt 132 lb (59.9 kg)   BMI 23.38 kg/m  Gen:  WD/WN, NAD.   Appears younger than stated age Head: [place]/AT, No temporalis wasting Ear/Nose/Throat: Hearing grossly intact, nares w/o erythema or drainage, oropharynx w/o Erythema/Exudate Eyes: Conjunctiva clear, sclera non-icteric  Neck: trachea midline.   Pulmonary:  Good air movement, respirations not labored, no use of accessory muscles Cardiac: RRR, no JVD Vascular:  Vessel Right Left  Radial Palpable Palpable  PT Palpable Palpable  DP  1+ palpable  trace palpable   Gastrointestinal: soft, non-tender/non-distended.  Musculoskeletal: M/S 5/5 throughout.  Extremities without ischemic changes.  No deformity or atrophy. No edema. Neurologic: Sensation grossly intact in extremities.  Symmetrical.  Speech is fluent. Motor exam as listed above. Psychiatric: Judgment intact, Mood & affect appropriate for pt's clinical situation. Dermatologic: No rashes or ulcers noted.  No cellulitis or open wounds.    Radiology No results found.  Labs Recent Results (from the past 2160 hour(s))  Hemoglobin A1c     Status: None   Collection Time: 08/12/18  2:17 PM  Result Value Ref Range   Hgb A1c MFr Bld 5.1 4.6 - 6.5 %    Comment: Glycemic Control Guidelines for People with Diabetes:Non Diabetic:  <6%Goal of Therapy: <7%Additional Action Suggested:  >8%   Comprehensive metabolic panel     Status: Abnormal   Collection Time: 08/12/18  2:17 PM  Result Value Ref Range   Sodium 135 135 - 145 mEq/L   Potassium 4.4 3.5 - 5.1 mEq/L   Chloride 98 96 - 112 mEq/L   CO2 28 19 - 32 mEq/L   Glucose, Bld 104 (H) 70 - 99 mg/dL   BUN 14 6 - 23 mg/dL   Creatinine, Ser 0.79 0.40 - 1.20 mg/dL   Total Bilirubin 0.4 0.2 - 1.2 mg/dL   Alkaline Phosphatase 65 39 - 117 U/L   AST 23 0 - 37 U/L   ALT 18 0 - 35 U/L   Total Protein 7.0 6.0 - 8.3 g/dL   Albumin 4.4 3.5 - 5.2 g/dL   Calcium 9.8 8.4 - 10.5 mg/dL   GFR 74.77 >60.00 mL/min  Direct LDL     Status: None   Collection Time: 08/12/18  2:17  PM  Result Value Ref Range   Direct LDL 115.0 mg/dL    Comment: Optimal:  <100 mg/dLNear or Above Optimal:  100-129 mg/dLBorderline High:  130-159 mg/dLHigh:  160-189 mg/dLVery High:  >190 mg/dL  TSH     Status: Abnormal   Collection Time: 08/12/18  2:17 PM  Result Value Ref Range   TSH 4.80 (H) 0.35 - 4.50 uIU/mL    Assessment/Plan:  Peripheral artery disease (HCC) Tibial disease seen on CT scan which I have independently reviewed.  No limb threatening or lifestyle limiting symptoms at current.  That study was a year ago so I think we can check ABIs at her convenience.  Renovascular hypertension Blood pressure control is now suboptimal on 2 medications.  The patient is going to increase 1 of her medications in the near future and would like to see how that does before proceeding with any intervention which is certainly reasonable.  I will plan to see her back after the first of the year to determine further treatment options.  As it has been a year since her CT scan, we will do a duplex at that time.  Renal artery stenosis [facility]) She underwent a CT scan for claudication symptoms a year ago.  On that CT scan she had report of likely fibromuscular dysplasia of the right renal artery.  It is difficult to discern how much stenosis is present associated with this, but I would agree that the artery looks quite irregular. Blood pressure control is now suboptimal on 2 medications.  The patient is going to increase 1 of her medications in the near future and would like to see how that does before proceeding with any intervention which is  certainly reasonable.  I will plan to see her back after the first of the year to determine further treatment options.  As it has been a year since her CT scan, we will do a duplex at that time.      [name] 08/18/2018, 10:47 AM   This note was created with Dragon medical transcription system.  Any errors from dictation are unintentional.

## 2018-08-18 NOTE — Assessment & Plan Note (Signed)
She underwent a CT scan for claudication symptoms a year ago.  On that CT scan she had report of likely fibromuscular dysplasia of the right renal artery.  It is difficult to discern how much stenosis is present associated with this, but I would agree that the artery looks quite irregular. Blood pressure control is now suboptimal on 2 medications.  The patient is going to increase 1 of her medications in the near future and would like to see how that does before proceeding with any intervention which is certainly reasonable.  I will plan to see her back after the first of the year to determine further treatment options.  As it has been a year since her CT scan, we will do a duplex at that time.

## 2018-08-19 ENCOUNTER — Other Ambulatory Visit: Payer: Self-pay | Admitting: Internal Medicine

## 2018-08-24 ENCOUNTER — Telehealth: Payer: Self-pay | Admitting: Internal Medicine

## 2018-08-24 DIAGNOSIS — I10 Essential (primary) hypertension: Secondary | ICD-10-CM

## 2018-08-24 MED ORDER — AMLODIPINE BESYLATE 5 MG PO TABS: 5.0000 mg | ORAL_TABLET | Freq: Every day | ORAL | 1 refills | Status: DC

## 2018-08-24 NOTE — Telephone Encounter (Signed)
My Chart message sent

## 2018-08-24 NOTE — Assessment & Plan Note (Signed)
Amlodipine dose  increased to 5 mg for elevated readings to 176/84 in December

## 2018-08-31 ENCOUNTER — Encounter (INDEPENDENT_AMBULATORY_CARE_PROVIDER_SITE_OTHER): Payer: Self-pay | Admitting: Vascular Surgery

## 2018-08-31 ENCOUNTER — Encounter

## 2018-08-31 ENCOUNTER — Encounter (INDEPENDENT_AMBULATORY_CARE_PROVIDER_SITE_OTHER): Payer: Self-pay

## 2018-09-11 ENCOUNTER — Other Ambulatory Visit: Payer: Self-pay | Admitting: Internal Medicine

## 2018-09-11 NOTE — Telephone Encounter (Signed)
Last fill 12/30/17 Last OV 08/12/18  Ok to fill?

## 2018-09-23 ENCOUNTER — Other Ambulatory Visit (INDEPENDENT_AMBULATORY_CARE_PROVIDER_SITE_OTHER): Payer: Medicare HMO

## 2018-09-23 DIAGNOSIS — E785 Hyperlipidemia, unspecified: Secondary | ICD-10-CM | POA: Diagnosis not present

## 2018-09-23 LAB — COMPREHENSIVE METABOLIC PANEL
AG Ratio: 1.8 (calc) (ref 1.0–2.5)
ALT: 20 U/L (ref 6–29)
AST: 24 U/L (ref 10–35)
Albumin: 4.3 g/dL (ref 3.6–5.1)
Alkaline phosphatase (APISO): 57 U/L (ref 33–130)
BUN: 11 mg/dL (ref 7–25)
CO2: 30 mmol/L (ref 20–32)
Calcium: 10 mg/dL (ref 8.6–10.4)
Chloride: 106 mmol/L (ref 98–110)
Creat: 0.74 mg/dL (ref 0.60–0.93)
Globulin: 2.4 g/dL (calc) (ref 1.9–3.7)
Glucose, Bld: 80 mg/dL (ref 65–99)
Potassium: 4.8 mmol/L (ref 3.5–5.3)
SODIUM: 142 mmol/L (ref 135–146)
Total Bilirubin: 0.6 mg/dL (ref 0.2–1.2)
Total Protein: 6.7 g/dL (ref 6.1–8.1)

## 2018-09-23 LAB — IRON,TIBC AND FERRITIN PANEL
%SAT: 63 % (calc) — ABNORMAL HIGH (ref 16–45)
Ferritin: 52 ng/mL (ref 16–288)
Iron: 171 ug/dL — ABNORMAL HIGH (ref 45–160)
TIBC: 271 mcg/dL (calc) (ref 250–450)

## 2018-09-23 LAB — LIPID PANEL
Cholesterol: 243 mg/dL — ABNORMAL HIGH (ref <200)
HDL: 101 mg/dL (ref >50)
LDL CHOLESTEROL (CALC): 124 mg/dL — AB
Non-HDL Cholesterol (Calc): 142 mg/dL (calc) — ABNORMAL HIGH (ref <130)
Total CHOL/HDL Ratio: 2.4 (calc) (ref <5.0)
Triglycerides: 83 mg/dL (ref <150)

## 2018-09-23 LAB — CBC WITH DIFFERENTIAL/PLATELET
Absolute Monocytes: 551 cells/uL (ref 200–950)
Basophils Absolute: 41 cells/uL (ref 0–200)
Basophils Relative: 0.8 %
EOS ABS: 240 {cells}/uL (ref 15–500)
Eosinophils Relative: 4.7 %
HCT: 40.5 % (ref 35.0–45.0)
Hemoglobin: 14.5 g/dL (ref 11.7–15.5)
Lymphs Abs: 1974 cells/uL (ref 850–3900)
MCH: 33.4 pg — AB (ref 27.0–33.0)
MCHC: 35.8 g/dL (ref 32.0–36.0)
MCV: 93.3 fL (ref 80.0–100.0)
MPV: 9.9 fL (ref 7.5–12.5)
Monocytes Relative: 10.8 %
NEUTROS ABS: 2295 {cells}/uL (ref 1500–7800)
Neutrophils Relative %: 45 %
Platelets: 224 10*3/uL (ref 140–400)
RBC: 4.34 10*6/uL (ref 3.80–5.10)
RDW: 12.2 % (ref 11.0–15.0)
Total Lymphocyte: 38.7 %
WBC: 5.1 10*3/uL (ref 3.8–10.8)

## 2018-09-23 NOTE — Addendum Note (Signed)
Addended by: Arby Barrette on: 09/23/2018 08:40 AM   Modules accepted: Orders

## 2018-09-24 ENCOUNTER — Telehealth: Payer: Self-pay

## 2018-09-24 DIAGNOSIS — E034 Atrophy of thyroid (acquired): Secondary | ICD-10-CM

## 2018-09-24 NOTE — Telephone Encounter (Signed)
Copied from [place] [id number]. Topic: General - Other >> Sep 24, 2018  4:14 PM Judyann Munson wrote: Reason for CRM: patient is calling to speak with a nurse in regards to her labs. She stated she received them on my chart but she wanting to discuss some concerns with a nurse

## 2018-09-25 ENCOUNTER — Telehealth (INDEPENDENT_AMBULATORY_CARE_PROVIDER_SITE_OTHER): Payer: Self-pay | Admitting: Vascular Surgery

## 2018-09-25 NOTE — Telephone Encounter (Signed)
Can someone explain the difference in u/s that pt previous had to what she will be getting. Thanks

## 2018-09-25 NOTE — Telephone Encounter (Signed)
Left vm to call back for further information

## 2018-09-25 NOTE — Telephone Encounter (Signed)
Patient has 2 ultrasounds coming up on the 21st.  One is to check her renal arteries the other is to check her arteries in her lower extremities.  This is based on the conversation that she had in December with Dr. Lucky Cowboy.  Upon his notes, it appears that they discussed these test since she has not been evaluated in a year.  She actually has not had an ultrasound since November 2018.  She also had an CT scan in November 2018.  We have not done any ultrasounds on her kidney or legs in a year.

## 2018-09-25 NOTE — Telephone Encounter (Signed)
Spoke with pt and and explained to her about her upcoming imaging with information from [facility]. Pt understood and had no additional questions at this time and will be getting both u/s done next week.

## 2018-09-28 ENCOUNTER — Encounter: Payer: Self-pay | Admitting: Internal Medicine

## 2018-09-28 NOTE — Addendum Note (Signed)
Addended by: Adair Laundry on: 09/28/2018 05:32 PM   Modules accepted: Orders

## 2018-09-28 NOTE — Telephone Encounter (Signed)
Spoke with pt and she stated that she was supposed to have her TSH rechecked because her dose was increased after her last labs. Scheduled pt for a non fasting lab appt. Pt is aware of appt date and time. Pt did state that she my be a few minutes late because she has an appt before hand.

## 2018-09-29 ENCOUNTER — Ambulatory Visit (INDEPENDENT_AMBULATORY_CARE_PROVIDER_SITE_OTHER): Payer: Medicare HMO | Admitting: Vascular Surgery

## 2018-09-29 ENCOUNTER — Other Ambulatory Visit (INDEPENDENT_AMBULATORY_CARE_PROVIDER_SITE_OTHER): Payer: Medicare HMO

## 2018-09-29 ENCOUNTER — Encounter (INDEPENDENT_AMBULATORY_CARE_PROVIDER_SITE_OTHER): Payer: Self-pay | Admitting: Vascular Surgery

## 2018-09-29 ENCOUNTER — Encounter (INDEPENDENT_AMBULATORY_CARE_PROVIDER_SITE_OTHER): Payer: Self-pay

## 2018-09-29 ENCOUNTER — Ambulatory Visit (INDEPENDENT_AMBULATORY_CARE_PROVIDER_SITE_OTHER): Payer: Medicare HMO

## 2018-09-29 VITALS — BP 195/90 | HR 81 | Resp 16 | Ht 63.0 in | Wt 133.6 lb

## 2018-09-29 DIAGNOSIS — E7800 Pure hypercholesterolemia, unspecified: Secondary | ICD-10-CM

## 2018-09-29 DIAGNOSIS — I739 Peripheral vascular disease, unspecified: Secondary | ICD-10-CM

## 2018-09-29 DIAGNOSIS — I10 Essential (primary) hypertension: Secondary | ICD-10-CM

## 2018-09-29 DIAGNOSIS — E034 Atrophy of thyroid (acquired): Secondary | ICD-10-CM | POA: Diagnosis not present

## 2018-09-29 LAB — TSH: TSH: 1.51 u[IU]/mL (ref 0.35–4.50)

## 2018-09-29 NOTE — Assessment & Plan Note (Signed)
No renal artery stenosis seen on duplex.  Her blood pressure control remains suboptimal.  I will defer to her primary care physician in terms of increasing medications as needed.

## 2018-09-29 NOTE — Progress Notes (Signed)
MRN : [id number]  Wendy Love is a 78 y.o. (22-Jan-1941) female who presents with chief complaint of  Chief Complaint  Patient presents with  . Follow-up  .  History of Present Illness: Patient returns today in follow up of multiple issues.  Her BP remains high.  She is remaining active and has minimal claudication symptoms.  Her ABIs today are 1.07 on the right and .89 on the left.  The velocities in the left SFA are mildly elevated.   Also, a renal artery duplex performed prior to her visit today showed no hemodynamically significant renal artery stenosis in either kidney.  Current Outpatient Medications  Medication Sig Dispense Refill  . amLODipine (NORVASC) 5 MG tablet Take 1 tablet (5 mg total) by mouth daily. 90 tablet 1  . b complex vitamins tablet Take 1 tablet by mouth daily.      [name] CALCIUM-MAGNESIUM PO Take by mouth.    . Cholecalciferol (VITAMIN D3) 2000 UNITS TABS Take 5,000 Units by mouth daily.     [name] levothyroxine (SYNTHROID, LEVOTHROID) 75 MCG tablet Take 1 tablet (75 mcg total) by mouth daily. 90 tablet 1  . losartan (COZAAR) 50 MG tablet TAKE 2 TABLETS BY MOUTH EVERYDAY 180 tablet 1  . Magnesium 300 MG CAPS Take by mouth daily.    . Multiple Vitamin (MULTIVITAMIN) tablet Take 1 tablet by mouth daily.    . Omega-3 Fatty Acids (FISH OIL PO) Take by mouth daily.    [name] PROBIOTIC CAPS Take by mouth daily.      . temazepam (RESTORIL) 15 MG capsule TAKE 1 CAPSULE BY MOUTH AT BEDTIME 30 capsule 5  . tretinoin (RETIN-A) 0.025 % cream APPLY TO WHOLE FACE EACH NIGHT.  5  . vitamin C (ASCORBIC ACID) 500 MG tablet Take 500 mg by mouth daily.    . Triamcinolone Acetonide (NASACORT ALLERGY 24HR NA) Place 1 spray into the nose daily as needed.      No current facility-administered medications for this visit.     Past Medical History:  Diagnosis Date  . Anxiety   . Bronchitis   . Degenerative disc disease, lumbar   . Depression   . Endometriosis 1974   s/p abdominal  surgery, 2nd surgery  . High cholesterol   . Hypertension   . Hypothyroidism   . Rhinosinusitis   . Sciatica     Past Surgical History:  Procedure Laterality Date  . ABDOMINAL HYSTERECTOMY  2000  . APPENDECTOMY    . CARPAL TUNNEL RELEASE     bilateral  . Endoscopy/colonoscopy  April 2012   Schatski's Ring , small hiatal hernia  . hemilaminectomy  April 2010   L3-L4hemi, L4-L5 microdiskectomy  . KNEE ARTHROSCOPY WITH LATERAL MENISECTOMY Right 04/08/2018   Procedure: KNEE ARTHROSCOPY WITH PARTIAL LATERAL MENISECTOMY and debridement.;  Surgeon: Corky Mull, MD;  Location: [facility];  Service: Orthopedics;  Laterality: Right;    Social History Social History   Tobacco Use  . Smoking status: Never Smoker  . Smokeless tobacco: Never Used  . Tobacco comment: smoked socially in college  Substance Use Topics  . Alcohol use: No  . Drug use: No    Family History Family History  Problem Relation Age of Onset  . Ovarian cancer Mother   . Cancer Mother 59       ovarian cancer, treated by [facility]  . Heart disease Father     Allergies  Allergen Reactions  . Atorvastatin Other (See Comments)  myalgias  . Brompheniramine-Pseudoeph     REACTION: agitation, nervous, sleepless  . Codeine     REACTION: GI Upset  . Diphenhydramine Hcl     REACTION: nervous, sleepless, agitated  . Loratadine     REACTION: nervous, sleeplessness  . Tramadol Hcl Rash    hyperactivity    REVIEW OF SYSTEMS (Negative unless checked)  Constitutional: [] ?Weight loss  [] ?Fever  [] ?Chills Cardiac: [] ?Chest pain   [] ?Chest pressure   [] ?Palpitations   [] ?Shortness of breath when laying flat   [] ?Shortness of breath at rest   [] ?Shortness of breath with exertion. Vascular:  [x] ?Pain in legs with walking   [] ?Pain in legs at rest   [] ?Pain in legs when laying flat   [x] ?Claudication   [] ?Pain in feet when walking  [] ?Pain in feet at rest  [] ?Pain in feet when laying flat   [] ?History of DVT    [] ?Phlebitis   [] ?Swelling in legs   [] ?Varicose veins   [] ?Non-healing ulcers Pulmonary:   [] ?Uses home oxygen   [] ?Productive cough   [] ?Hemoptysis   [] ?Wheeze  [] ?COPD   [] ?Asthma Neurologic:  [] ?Dizziness  [] ?Blackouts   [] ?Seizures   [] ?History of stroke   [] ?History of TIA  [] ?Aphasia   [] ?Temporary blindness   [] ?Dysphagia   [] ?Weakness or numbness in arms   [] ?Weakness or numbness in legs Musculoskeletal:  [x] ?Arthritis   [] ?Joint swelling   [] ?Joint pain   [] ?Low back pain Hematologic:  [] ?Easy bruising  [] ?Easy bleeding   [] ?Hypercoagulable state   [] ?Anemic  [] ?Hepatitis Gastrointestinal:  [] ?Blood in stool   [] ?Vomiting blood  [] ?Gastroesophageal reflux/heartburn   [] ?Abdominal pain Genitourinary:  [] ?Chronic kidney disease   [] ?Difficult urination  [] ?Frequent urination  [] ?Burning with urination   [] ?Hematuria Skin:  [] ?Rashes   [] ?Ulcers   [] ?Wounds Psychological:  [] ?History of anxiety   [] ? History of major depression.     Physical Examination  BP (!) 195/90 (BP Location: Right Arm, Patient Position: Sitting, Cuff Size: Normal)   Pulse 81   Resp 16   Ht 5\' 3"  (1.6 m)   Wt 133 lb 9.6 oz (60.6 kg)   BMI 23.67 kg/m  Gen:  WD/WN, NAD.  Appears younger than stated age Head: [place]/AT, No temporalis wasting. Ear/Nose/Throat: Hearing grossly intact, nares w/o erythema or drainage Eyes: Conjunctiva clear. Sclera non-icteric Neck: Supple.  Trachea midline Pulmonary:  Good air movement, no use of accessory muscles.  Cardiac: RRR, no JVD Vascular:  Vessel Right Left  Radial Palpable Palpable                          PT Palpable Palpable  DP Palpable Palpable    Musculoskeletal: M/S 5/5 throughout.  No deformity or atrophy.  No edema. Neurologic: Sensation grossly intact in extremities.  Symmetrical.  Speech is fluent.  Psychiatric: Judgment intact, Mood & affect appropriate for pt's clinical situation. Dermatologic: No rashes or ulcers noted.  No cellulitis or open  wounds.       Labs Recent Results (from the past 2160 hour(s))  Hemoglobin A1c     Status: None   Collection Time: 08/12/18  2:17 PM  Result Value Ref Range   Hgb A1c MFr Bld 5.1 4.6 - 6.5 %    Comment: Glycemic Control Guidelines for People with Diabetes:Non Diabetic:  <6%Goal of Therapy: <7%Additional Action Suggested:  >8%   Comprehensive metabolic panel     Status: Abnormal   Collection Time: 08/12/18  2:17 PM  Result  Value Ref Range   Sodium 135 135 - 145 mEq/L   Potassium 4.4 3.5 - 5.1 mEq/L   Chloride 98 96 - 112 mEq/L   CO2 28 19 - 32 mEq/L   Glucose, Bld 104 (H) 70 - 99 mg/dL   BUN 14 6 - 23 mg/dL   Creatinine, Ser 0.79 0.40 - 1.20 mg/dL   Total Bilirubin 0.4 0.2 - 1.2 mg/dL   Alkaline Phosphatase 65 39 - 117 U/L   AST 23 0 - 37 U/L   ALT 18 0 - 35 U/L   Total Protein 7.0 6.0 - 8.3 g/dL   Albumin 4.4 3.5 - 5.2 g/dL   Calcium 9.8 8.4 - 10.5 mg/dL   GFR 74.77 >60.00 mL/min  Direct LDL     Status: None   Collection Time: 08/12/18  2:17 PM  Result Value Ref Range   Direct LDL 115.0 mg/dL    Comment: Optimal:  <100 mg/dLNear or Above Optimal:  100-129 mg/dLBorderline High:  130-159 mg/dLHigh:  160-189 mg/dLVery High:  >190 mg/dL  TSH     Status: Abnormal   Collection Time: 08/12/18  2:17 PM  Result Value Ref Range   TSH 4.80 (H) 0.35 - 4.50 uIU/mL  CBC with Differential/Platelet     Status: Abnormal   Collection Time: 09/23/18  8:40 AM  Result Value Ref Range   WBC 5.1 3.8 - 10.8 Thousand/uL   RBC 4.34 3.80 - 5.10 Million/uL   Hemoglobin 14.5 11.7 - 15.5 g/dL   HCT 40.5 35.0 - 45.0 %   MCV 93.3 80.0 - 100.0 fL   MCH 33.4 (H) 27.0 - 33.0 pg   MCHC 35.8 32.0 - 36.0 g/dL   RDW 12.2 11.0 - 15.0 %   Platelets 224 140 - 400 Thousand/uL   MPV 9.9 7.5 - 12.5 fL   Neutro Abs 2,295 1,500 - 7,800 cells/uL   Lymphs Abs 1,974 850 - 3,900 cells/uL   Absolute Monocytes 551 200 - 950 cells/uL   Eosinophils Absolute 240 15 - 500 cells/uL   Basophils Absolute 41 0 - 200  cells/uL   Neutrophils Relative % 45 %   Total Lymphocyte 38.7 %   Monocytes Relative 10.8 %   Eosinophils Relative 4.7 %   Basophils Relative 0.8 %  Lipid panel     Status: Abnormal   Collection Time: 09/23/18  8:40 AM  Result Value Ref Range   Cholesterol 243 (H) <200 mg/dL   HDL 101 >50 mg/dL   Triglycerides 83 <150 mg/dL   LDL Cholesterol (Calc) 124 (H) mg/dL (calc)    Comment: Reference range: <100 . Desirable range <100 mg/dL for primary prevention;   <70 mg/dL for patients with CHD or diabetic patients  with > or = 2 CHD risk factors. [name] LDL-C is now calculated using the Martin-Hopkins  calculation, which is a validated novel method providing  better accuracy than the Friedewald equation in the  estimation of LDL-C.  Cresenciano Genre et al. Annamaria Helling. 0998;338(25): 2061-2068  (http://education.[facility].com/faq/FAQ164)    Total CHOL/HDL Ratio 2.4 <5.0 (calc)   Non-HDL Cholesterol (Calc) 142 (H) <130 mg/dL (calc)    Comment: For patients with diabetes plus 1 major ASCVD risk  factor, treating to a non-HDL-C goal of <100 mg/dL  (LDL-C of <70 mg/dL) is considered a therapeutic  option.   Comprehensive metabolic panel     Status: None   Collection Time: 09/23/18  8:40 AM  Result Value Ref Range   Glucose, Bld 80 65 -  99 mg/dL    Comment: .            Fasting reference interval .    BUN 11 7 - 25 mg/dL   Creat 0.74 0.60 - 0.93 mg/dL    Comment: For patients >28 years of age, the reference limit for Creatinine is approximately 13% higher for people identified as African-American. .    BUN/Creatinine Ratio NOT APPLICABLE 6 - 22 (calc)   Sodium 142 135 - 146 mmol/L   Potassium 4.8 3.5 - 5.3 mmol/L   Chloride 106 98 - 110 mmol/L   CO2 30 20 - 32 mmol/L   Calcium 10.0 8.6 - 10.4 mg/dL   Total Protein 6.7 6.1 - 8.1 g/dL   Albumin 4.3 3.6 - 5.1 g/dL   Globulin 2.4 1.9 - 3.7 g/dL (calc)   AG Ratio 1.8 1.0 - 2.5 (calc)   Total Bilirubin 0.6 0.2 - 1.2 mg/dL   Alkaline  phosphatase (APISO) 57 33 - 130 U/L   AST 24 10 - 35 U/L   ALT 20 6 - 29 U/L  Iron, TIBC and Ferritin Panel     Status: Abnormal   Collection Time: 09/23/18  8:40 AM  Result Value Ref Range   Iron 171 (H) 45 - 160 mcg/dL   TIBC 271 250 - 450 mcg/dL (calc)   %SAT 63 (H) 16 - 45 % (calc)   Ferritin 52 16 - 288 ng/mL    Radiology No results found.  Assessment/Plan  Essential hypertension No renal artery stenosis seen on duplex.  Her blood pressure control remains suboptimal.  I will defer to her primary care physician in terms of increasing medications as needed.  Hyperlipidemia lipid control important in reducing the progression of atherosclerotic disease. Continue statin therapy   Peripheral artery disease (HCC)  Her ABIs today are 1.07 on the right and .89 on the left.  The velocities in the left SFA are mildly elevated.   No limb threatening or lifestyle limiting symptoms.  Continue current medical regimen.  Recheck in 1 year.    [name], MD  09/29/2018 9:27 AM    This note was created with Dragon medical transcription system.  Any errors from dictation are purely unintentional

## 2018-09-29 NOTE — Patient Instructions (Signed)
Peripheral Vascular Disease  Peripheral vascular disease (PVD) is a disease of the blood vessels that are not part of your heart and brain. A simple term for PVD is poor circulation. In most cases, PVD narrows the blood vessels that carry blood from your heart to the rest of your body. This can reduce the supply of blood to your arms, legs, and internal organs, like your stomach or kidneys. However, PVD most often affects a person's lower legs and feet. Without treatment, PVD tends to get worse. PVD can also lead to acute ischemic limb. This is when an arm or leg suddenly cannot get enough blood. This is a medical emergency. Follow these instructions at home: Lifestyle  Do not use any products that contain nicotine or tobacco, such as cigarettes and e-cigarettes. If you need help quitting, ask your doctor.  Lose weight if you are overweight. Or, stay at a healthy weight as told by your doctor.  Eat a diet that is low in fat and cholesterol. If you need help, ask your doctor.  Exercise regularly. Ask your doctor for activities that are right for you. General instructions  Take over-the-counter and prescription medicines only as told by your doctor.  Take good care of your feet: ? Wear comfortable shoes that fit well. ? Check your feet often for any cuts or sores.  Keep all follow-up visits as told by your doctor This is important. Contact a doctor if:  You have cramps in your legs when you walk.  You have leg pain when you are at rest.  You have coldness in a leg or foot.  Your skin changes.  You are unable to get or have an erection (erectile dysfunction).  You have cuts or sores on your feet that do not heal. Get help right away if:  Your arm or leg turns cold, numb, and blue.  Your arms or legs become red, warm, swollen, painful, or numb.  You have chest pain.  You have trouble breathing.  You suddenly have weakness in your face, arm, or leg.  You become very  confused or you cannot speak.  You suddenly have a very bad headache.  You suddenly cannot see. Summary  Peripheral vascular disease (PVD) is a disease of the blood vessels.  A simple term for PVD is poor circulation. Without treatment, PVD tends to get worse.  Treatment may include exercise, low fat and low cholesterol diet, and quitting smoking. This information is not intended to replace advice given to you by your health care provider. Make sure you discuss any questions you have with your health care provider. Document Released: 11/20/2009 Document Revised: 10/03/2016 Document Reviewed: 10/03/2016 Elsevier Interactive Patient Education  2019 [employer]  

## 2018-09-29 NOTE — Assessment & Plan Note (Signed)
Her ABIs today are 1.07 on the right and .89 on the left.  The velocities in the left SFA are mildly elevated.   No limb threatening or lifestyle limiting symptoms.  Continue current medical regimen.  Recheck in 1 year.

## 2018-09-29 NOTE — Assessment & Plan Note (Signed)
lipid control important in reducing the progression of atherosclerotic disease. Continue statin therapy  

## 2018-10-13 ENCOUNTER — Ambulatory Visit (INDEPENDENT_AMBULATORY_CARE_PROVIDER_SITE_OTHER): Payer: Medicare HMO | Admitting: Vascular Surgery

## 2018-10-13 ENCOUNTER — Encounter (INDEPENDENT_AMBULATORY_CARE_PROVIDER_SITE_OTHER): Payer: Medicare HMO

## 2018-11-09 DIAGNOSIS — H40003 Preglaucoma, unspecified, bilateral: Secondary | ICD-10-CM | POA: Diagnosis not present

## 2018-11-16 DIAGNOSIS — H40003 Preglaucoma, unspecified, bilateral: Secondary | ICD-10-CM | POA: Diagnosis not present

## 2018-12-15 ENCOUNTER — Ambulatory Visit: Payer: Medicare HMO

## 2019-01-08 ENCOUNTER — Telehealth: Payer: Self-pay

## 2019-01-08 ENCOUNTER — Other Ambulatory Visit: Payer: Self-pay | Admitting: Internal Medicine

## 2019-01-08 NOTE — Telephone Encounter (Signed)
Spoke with patient to schedule overdue F/U with Dr. Rockey Situ. Offered telephone or video visit due to current clinic policies related to COVID 19 precautions. Patient states she is scheduled for a tele-health visit with Dr. Derrel Nip next week and she will call back to schedule after she talks with her first. Offered to place a 04/10/2019 recall as well and patient states she would appreciate that because she would prefer to come in later in the year after the the virus precautions are lifted. Recall placed.

## 2019-01-20 DIAGNOSIS — L570 Actinic keratosis: Secondary | ICD-10-CM | POA: Diagnosis not present

## 2019-01-20 DIAGNOSIS — L820 Inflamed seborrheic keratosis: Secondary | ICD-10-CM | POA: Diagnosis not present

## 2019-01-20 DIAGNOSIS — L538 Other specified erythematous conditions: Secondary | ICD-10-CM | POA: Diagnosis not present

## 2019-01-20 DIAGNOSIS — L729 Follicular cyst of the skin and subcutaneous tissue, unspecified: Secondary | ICD-10-CM | POA: Diagnosis not present

## 2019-01-20 DIAGNOSIS — S70362A Insect bite (nonvenomous), left thigh, initial encounter: Secondary | ICD-10-CM | POA: Diagnosis not present

## 2019-01-20 DIAGNOSIS — D485 Neoplasm of uncertain behavior of skin: Secondary | ICD-10-CM | POA: Diagnosis not present

## 2019-01-26 DIAGNOSIS — R69 Illness, unspecified: Secondary | ICD-10-CM | POA: Diagnosis not present

## 2019-02-03 DIAGNOSIS — Z809 Family history of malignant neoplasm, unspecified: Secondary | ICD-10-CM | POA: Diagnosis not present

## 2019-02-03 DIAGNOSIS — R69 Illness, unspecified: Secondary | ICD-10-CM | POA: Diagnosis not present

## 2019-02-03 DIAGNOSIS — I739 Peripheral vascular disease, unspecified: Secondary | ICD-10-CM | POA: Diagnosis not present

## 2019-02-03 DIAGNOSIS — H547 Unspecified visual loss: Secondary | ICD-10-CM | POA: Diagnosis not present

## 2019-02-03 DIAGNOSIS — Z7982 Long term (current) use of aspirin: Secondary | ICD-10-CM | POA: Diagnosis not present

## 2019-02-03 DIAGNOSIS — I10 Essential (primary) hypertension: Secondary | ICD-10-CM | POA: Diagnosis not present

## 2019-02-03 DIAGNOSIS — G4700 Insomnia, unspecified: Secondary | ICD-10-CM | POA: Diagnosis not present

## 2019-02-03 DIAGNOSIS — M549 Dorsalgia, unspecified: Secondary | ICD-10-CM | POA: Diagnosis not present

## 2019-02-03 DIAGNOSIS — Z823 Family history of stroke: Secondary | ICD-10-CM | POA: Diagnosis not present

## 2019-02-03 DIAGNOSIS — E039 Hypothyroidism, unspecified: Secondary | ICD-10-CM | POA: Diagnosis not present

## 2019-02-06 ENCOUNTER — Other Ambulatory Visit: Payer: Self-pay | Admitting: Internal Medicine

## 2019-02-09 ENCOUNTER — Telehealth: Payer: Self-pay

## 2019-02-09 NOTE — Telephone Encounter (Signed)
Copied from [place] [id number]. Topic: Appointment Scheduling - Scheduling Inquiry for Clinic >> Feb 09, 2019 12:03 PM Virl Axe D wrote: Reason for CRM: Pt would like to change her AWV to a later time on 02/17/19. Please advise.

## 2019-02-12 ENCOUNTER — Other Ambulatory Visit: Payer: Self-pay

## 2019-02-15 ENCOUNTER — Ambulatory Visit: Payer: Medicare HMO

## 2019-02-15 ENCOUNTER — Encounter: Payer: Self-pay | Admitting: Internal Medicine

## 2019-02-15 ENCOUNTER — Other Ambulatory Visit: Payer: Self-pay

## 2019-02-15 ENCOUNTER — Ambulatory Visit (INDEPENDENT_AMBULATORY_CARE_PROVIDER_SITE_OTHER): Payer: Medicare HMO | Admitting: Internal Medicine

## 2019-02-15 VITALS — BP 170/82 | HR 86 | Temp 98.3°F | Resp 15 | Ht 63.0 in | Wt 132.4 lb

## 2019-02-15 DIAGNOSIS — I10 Essential (primary) hypertension: Secondary | ICD-10-CM

## 2019-02-15 DIAGNOSIS — M85852 Other specified disorders of bone density and structure, left thigh: Secondary | ICD-10-CM

## 2019-02-15 DIAGNOSIS — M810 Age-related osteoporosis without current pathological fracture: Secondary | ICD-10-CM

## 2019-02-15 DIAGNOSIS — R7301 Impaired fasting glucose: Secondary | ICD-10-CM

## 2019-02-15 DIAGNOSIS — E034 Atrophy of thyroid (acquired): Secondary | ICD-10-CM

## 2019-02-15 DIAGNOSIS — Z0000 Encounter for general adult medical examination without abnormal findings: Secondary | ICD-10-CM | POA: Diagnosis not present

## 2019-02-15 DIAGNOSIS — Z780 Asymptomatic menopausal state: Secondary | ICD-10-CM

## 2019-02-15 DIAGNOSIS — M8580 Other specified disorders of bone density and structure, unspecified site: Secondary | ICD-10-CM

## 2019-02-15 MED ORDER — TRIAMTERENE-HCTZ 37.5-25 MG PO TABS: 1.0000 | ORAL_TABLET | Freq: Every day | ORAL | 1 refills | Status: DC

## 2019-02-15 MED ORDER — DOXYCYCLINE HYCLATE 100 MG PO TABS: 100.0000 mg | ORAL_TABLET | Freq: Two times a day (BID) | ORAL | 0 refills | Status: DC

## 2019-02-15 NOTE — Progress Notes (Signed)
Patient ID: Wendy Love, female    DOB: August 28, 1941  Age: 78 y.o. MRN: [id number]  The patient is here for annual preventive  examination and management of other chronic and acute problems.   The risk factors are reflected in the social history.  The roster of all physicians providing medical care to patient - is listed in the Snapshot section of the chart.  Activities of daily living:  The patient is 100% independent in all ADLs: dressing, toileting, feeding as well as independent mobility  Home safety : The patient has smoke detectors in the home. They wear seatbelts.  There are no firearms at home. There is no violence in the home.   There is no risks for hepatitis, STDs or HIV. There is no   history of blood transfusion. They have no travel history to infectious disease endemic areas of the world.  The patient has seen their dentist in the last six month. They have seen their eye doctor in the last year. They deny hearing difficulty with regard to whispered voices and some television programs.  They have deferred audiologic testing in the last year.  They do not  have excessive sun exposure. Discussed the need for sun protection: hats, long sleeves and use of sunscreen if there is significant sun exposure.   Diet: the importance of a healthy diet is discussed. They do have a healthy diet.  The benefits of regular aerobic exercise were discussed. She walks 4 times per week ,  20 minutes  The patient has no signs or symptoms of COVID 19 infection (fever, cough, sore throat  or shortness of breath beyond what is typical for patient).  Patient denies contact with other persons with the above mentioned symptoms or with anyone confirmed to have COVID 19 .   Depression screen: there are no signs or vegative symptoms of depression- irritability, change in appetite, anhedonia, sadness/tearfullness.  Cognitive assessment: the patient manages all their financial and personal affairs and is  actively engaged. They could relate day,date,year and events; recalled 2/3 objects at 3 minutes; performed clock-face test normally.  The following portions of the patient's history were reviewed and updated as appropriate: allergies, current medications, past family history, past medical history,  past surgical history, past social history  and problem list.  Visual acuity was not assessed per patient preference since she has regular follow up with her ophthalmologist. Hearing and body mass index were assessed and reviewed.   During the course of the visit the patient was educated and counseled about appropriate screening and preventive services including : fall prevention , diabetes screening, nutrition counseling, colorectal cancer screening, and recommended immunizations.    CC: The primary encounter diagnosis was Osteopenia of neck of left femur. Diagnoses of Hypothyroidism due to acquired atrophy of thyroid, Essential hypertension, Impaired fasting glucose, Encounter for preventive health examination, and Osteopenia after menopause were also pertinent to this visit.  Had a tick bite 3 weeks ago. No rash , fevers or headaches   Waking up every 1.5 to 2 hours,  Feels jittery. Wonders if thyroid function is overactive. No other symptoms.   Hypertension: patient checks blood pressure twice weekly at home.  Readings have been  > 140/80 at rest and most > 150 . Patient is following a reduced salt diet most days and is taking medications as prescribed  Mammogram ordered   Lab Results  Component Value Date   TSH 1.51 09/29/2018       History Wendy Love has  a past medical history of Anxiety, Bronchitis, Degenerative disc disease, lumbar, Depression, Endometriosis (1974), High cholesterol, Hypertension, Hypothyroidism, Rhinosinusitis, and Sciatica.   She has a past surgical history that includes Carpal tunnel release; Appendectomy; hemilaminectomy (April 2010); Endoscopy/colonoscopy (April  2012); Abdominal hysterectomy (2000); and Knee arthroscopy with lateral menisectomy (Right, 04/08/2018).   Her family history includes Cancer (age of onset: 64) in her mother; Heart disease in her father; Ovarian cancer in her mother.She reports that she has never smoked. She has never used smokeless tobacco. She reports that she does not drink alcohol or use drugs.  Outpatient Medications Prior to Visit  Medication Sig Dispense Refill  . amLODipine (NORVASC) 5 MG tablet Take 1 tablet (5 mg total) by mouth daily. 90 tablet 1  . b complex vitamins tablet Take 1 tablet by mouth daily.      [name] CALCIUM-MAGNESIUM PO Take by mouth.    . Cholecalciferol (VITAMIN D3) 2000 UNITS TABS Take 5,000 Units by mouth daily.     [name] levothyroxine (SYNTHROID) 75 MCG tablet TAKE 1 TABLET BY MOUTH EVERY DAY 90 tablet 0  . losartan (COZAAR) 50 MG tablet TAKE 2 TABLETS BY MOUTH EVERY DAY 180 tablet 1  . Magnesium 300 MG CAPS Take by mouth daily.    . Multiple Vitamin (MULTIVITAMIN) tablet Take 1 tablet by mouth daily.    . Omega-3 Fatty Acids (FISH OIL PO) Take by mouth daily.    [name] PROBIOTIC CAPS Take by mouth daily.      . temazepam (RESTORIL) 15 MG capsule TAKE 1 CAPSULE BY MOUTH AT BEDTIME 30 capsule 5  . tretinoin (RETIN-A) 0.025 % cream APPLY TO WHOLE FACE EACH NIGHT.  5  . Triamcinolone Acetonide (NASACORT ALLERGY 24HR NA) Place 1 spray into the nose daily as needed.     . Turmeric 500 MG CAPS Take 500 mg by mouth 2 (two) times daily.    . vitamin C (ASCORBIC ACID) 500 MG tablet Take 1,000 mg by mouth daily.     . Zinc 25 MG TABS Take 25 mg by mouth daily.     No facility-administered medications prior to visit.     Review of Systems   Patient denies headache, fevers, malaise, unintentional weight loss, skin rash, eye pain, sinus congestion and sinus pain, sore throat, dysphagia,  hemoptysis , cough, dyspnea, wheezing, chest pain, palpitations, orthopnea, edema, abdominal pain, nausea, melena, diarrhea,  constipation, flank pain, dysuria, hematuria, urinary  Frequency, nocturia, numbness, tingling, seizures,  Focal weakness, Loss of consciousness,  Tremor, insomnia, depression, anxiety, and suicidal ideation.     Objective:  BP (!) 170/82 (BP Location: Left Arm, Patient Position: Sitting, Cuff Size: Normal)   Pulse 86   Temp 98.3 F (36.8 C) (Oral)   Resp 15   Ht 5\' 3"  (1.6 m)   Wt 132 lb 6.4 oz (60.1 kg)   SpO2 99%   BMI 23.45 kg/m   Physical Exam   General appearance: alert, cooperative and appears stated age Head: Normocephalic, without obvious abnormality, atraumatic Eyes: conjunctivae/corneas clear. PERRL, EOM's intact. Fundi benign. Ears: normal TM's and external ear canals both ears Nose: Nares normal. Septum midline. Mucosa normal. No drainage or sinus tenderness. Throat: lips, mucosa, and tongue normal; teeth and gums normal Neck: no adenopathy, no carotid bruit, no JVD, supple, symmetrical, trachea midline and thyroid not enlarged, symmetric, no tenderness/mass/nodules Lungs: clear to auscultation bilaterally Breasts: normal appearance, no masses or tenderness Heart: regular rate and rhythm, S1, S2 normal, no murmur, click,  rub or gallop Abdomen: soft, non-tender; bowel sounds normal; no masses,  no organomegaly Extremities: extremities normal, atraumatic, no cyanosis or edema Pulses: 2+ and symmetric Skin: Skin color, texture, turgor normal. No rashes or lesions Neurologic: Alert and oriented X 3, normal strength and tone. Normal symmetric reflexes. Normal coordination and gait.     Assessment & Plan:   Problem List Items Addressed This Visit    Impaired fasting glucose   Relevant Orders   Hemoglobin A1c   Hypothyroidism     Thyroid function was normal in January, but she is waking up feeling "jittery"  .  If thyroid function and A1c are normal,  May need repeat Holter monitor to rule out return of atrial tachycardia or other arrhythmia.        Relevant Orders    TSH   Essential hypertension    Not at goal on amlodipine and losartan.  Adding maxzide       Relevant Medications   triamterene-hydrochlorothiazide (MAXZIDE-25) 37.5-25 MG tablet   Other Relevant Orders   Comprehensive metabolic panel   Lipid panel   Encounter for preventive health examination    age appropriate education and counseling updated, referrals for preventative services and immunizations addressed, dietary and smoking counseling addressed, most recent labs reviewed.  I have personally reviewed and have noted:  1) the patient's medical and social history 2) The pt's use of alcohol, tobacco, and illicit drugs 3) The patient's current medications and supplements 4) Functional ability including ADL's, fall risk, home safety risk, hearing and visual impairment 5) Diet and physical activities 6) Evidence for depression or mood disorder 7) The patient's height, weight, and BMI have been recorded in the chart  I have made referrals, and provided counseling and education based on review of the above      Osteopenia after menopause    She is due for DEXA this year to assess rate of decline.       Other Visit Diagnoses    Osteopenia of neck of left femur    -  Primary   Relevant Orders   DG Bone Density      I am having Edye W. O'Reilly "Polly" start on doxycycline and triamterene-hydrochlorothiazide. I am also having her maintain her b complex vitamins, Vitamin D3, Probiotic, multivitamin, Triamcinolone Acetonide (NASACORT ALLERGY 24HR NA), vitamin C, Magnesium, Omega-3 Fatty Acids (FISH OIL PO), tretinoin, amLODipine, temazepam, CALCIUM-MAGNESIUM PO, losartan, levothyroxine, Turmeric, and Zinc.  Meds ordered this encounter  Medications  . doxycycline (VIBRA-TABS) 100 MG tablet    Sig: Take 1 tablet (100 mg total) by mouth 2 (two) times daily.    Dispense:  20 tablet    Refill:  0  . triamterene-hydrochlorothiazide (MAXZIDE-25) 37.5-25 MG tablet    Sig: Take 1  tablet by mouth daily.    Dispense:  30 tablet    Refill:  1    There are no discontinued medications.  Follow-up: No follow-ups on file.   Crecencio Mc, MD

## 2019-02-15 NOTE — Patient Instructions (Addendum)
Save the doxycycline for the next tick bite that develops a bulls' eye rash  or a fever and headache.   For your knee   400 mg ibuprofen very 8 hours,  Plus ice for 15 minutes every  Hours    Your blood pressureis not at goal yet.  (closer to 130/80 is our goal)  I am adding triamterene/hct to your current regimen.  Take it in the morning  Take the entire dose of losartan in th e evening  Take the amlodipine in the morning  Return for fasting labs after you have been taking the new medication for a week   DEXA will be ordered    Health Maintenance for Postmenopausal Women Menopause is a normal process in which your reproductive ability comes to an end. This process happens gradually over a span of months to years, usually between the ages of 20 and 29. Menopause is complete when you have missed 12 consecutive menstrual periods. It is important to talk with your health care provider about some of the most common conditions that affect postmenopausal women, such as heart disease, cancer, and bone loss (osteoporosis). Adopting a healthy lifestyle and getting preventive care can help to promote your health and wellness. Those actions can also lower your chances of developing some of these common conditions. What should I know about menopause? During menopause, you may experience a number of symptoms, such as:  Moderate-to-severe hot flashes.  Night sweats.  Decrease in sex drive.  Mood swings.  Headaches.  Tiredness.  Irritability.  Memory problems.  Insomnia. Choosing to treat or not to treat menopausal changes is an individual decision that you make with your health care provider. What should I know about hormone replacement therapy and supplements? Hormone therapy products are effective for treating symptoms that are associated with menopause, such as hot flashes and night sweats. Hormone replacement carries certain risks, especially as you become older. If you are  thinking about using estrogen or estrogen with progestin treatments, discuss the benefits and risks with your health care provider. What should I know about heart disease and stroke? Heart disease, heart attack, and stroke become more likely as you age. This may be due, in part, to the hormonal changes that your body experiences during menopause. These can affect how your body processes dietary fats, triglycerides, and cholesterol. Heart attack and stroke are both medical emergencies. There are many things that you can do to help prevent heart disease and stroke:  Have your blood pressure checked at least every 1-2 years. High blood pressure causes heart disease and increases the risk of stroke.  If you are 51-36 years old, ask your health care provider if you should take aspirin to prevent a heart attack or a stroke.  Do not use any tobacco products, including cigarettes, chewing tobacco, or electronic cigarettes. If you need help quitting, ask your health care provider.  It is important to eat a healthy diet and maintain a healthy weight. ? Be sure to include plenty of vegetables, fruits, low-fat dairy products, and lean protein. ? Avoid eating foods that are high in solid fats, added sugars, or salt (sodium).  Get regular exercise. This is one of the most important things that you can do for your health. ? Try to exercise for at least 150 minutes each week. The type of exercise that you do should increase your heart rate and make you sweat. This is known as moderate-intensity exercise. ? Try to do strengthening exercises at  least twice each week. Do these in addition to the moderate-intensity exercise.  Know your numbers.Ask your health care provider to check your cholesterol and your blood glucose. Continue to have your blood tested as directed by your health care provider.  What should I know about cancer screening? There are several types of cancer. Take the following steps to reduce  your risk and to catch any cancer development as early as possible. Breast Cancer  Practice breast self-awareness. ? This means understanding how your breasts normally appear and feel. ? It also means doing regular breast self-exams. Let your health care provider know about any changes, no matter how small.  If you are 64 or older, have a clinician do a breast exam (clinical breast exam or CBE) every year. Depending on your age, family history, and medical history, it may be recommended that you also have a yearly breast X-ray (mammogram).  If you have a family history of breast cancer, talk with your health care provider about genetic screening.  If you are at high risk for breast cancer, talk with your health care provider about having an MRI and a mammogram every year.  Breast cancer (BRCA) gene test is recommended for women who have family members with BRCA-related cancers. Results of the assessment will determine the need for genetic counseling and BRCA1 and for BRCA2 testing. BRCA-related cancers include these types: ? Breast. This occurs in males or females. ? Ovarian. ? Tubal. This may also be called fallopian tube cancer. ? Cancer of the abdominal or pelvic lining (peritoneal cancer). ? Prostate. ? Pancreatic. Cervical, Uterine, and Ovarian Cancer Your health care provider may recommend that you be screened regularly for cancer of the pelvic organs. These include your ovaries, uterus, and vagina. This screening involves a pelvic exam, which includes checking for microscopic changes to the surface of your cervix (Pap test).  For women ages 21-65, health care providers may recommend a pelvic exam and a Pap test every three years. For women ages 32-65, they may recommend the Pap test and pelvic exam, combined with testing for human papilloma virus (HPV), every five years. Some types of HPV increase your risk of cervical cancer. Testing for HPV may also be done on women of any age who  have unclear Pap test results.  Other health care providers may not recommend any screening for nonpregnant women who are considered low risk for pelvic cancer and have no symptoms. Ask your health care provider if a screening pelvic exam is right for you.  If you have had past treatment for cervical cancer or a condition that could lead to cancer, you need Pap tests and screening for cancer for at least 20 years after your treatment. If Pap tests have been discontinued for you, your risk factors (such as having a new sexual partner) need to be reassessed to determine if you should start having screenings again. Some women have medical problems that increase the chance of getting cervical cancer. In these cases, your health care provider may recommend that you have screening and Pap tests more often.  If you have a family history of uterine cancer or ovarian cancer, talk with your health care provider about genetic screening.  If you have vaginal bleeding after reaching menopause, tell your health care provider.  There are currently no reliable tests available to screen for ovarian cancer. Lung Cancer Lung cancer screening is recommended for adults 42-108 years old who are at high risk for lung cancer because of  a history of smoking. A yearly low-dose CT scan of the lungs is recommended if you:  Currently smoke.  Have a history of at least 30 pack-years of smoking and you currently smoke or have quit within the past 15 years. A pack-year is smoking an average of one pack of cigarettes per day for one year. Yearly screening should:  Continue until it has been 15 years since you quit.  Stop if you develop a health problem that would prevent you from having lung cancer treatment. Colorectal Cancer  This type of cancer can be detected and can often be prevented.  Routine colorectal cancer screening usually begins at age 35 and continues through age 48.  If you have risk factors for colon  cancer, your health care provider may recommend that you be screened at an earlier age.  If you have a family history of colorectal cancer, talk with your health care provider about genetic screening.  Your health care provider may also recommend using home test kits to check for hidden blood in your stool.  A small camera at the end of a tube can be used to examine your colon directly (sigmoidoscopy or colonoscopy). This is done to check for the earliest forms of colorectal cancer.  Direct examination of the colon should be repeated every 5-10 years until age 18. However, if early forms of precancerous polyps or small growths are found or if you have a family history or genetic risk for colorectal cancer, you may need to be screened more often. Skin Cancer  Check your skin from head to toe regularly.  Monitor any moles. Be sure to tell your health care provider: ? About any new moles or changes in moles, especially if there is a change in a mole's shape or color. ? If you have a mole that is larger than the size of a pencil eraser.  If any of your family members has a history of skin cancer, especially at a young age, talk with your health care provider about genetic screening.  Always use sunscreen. Apply sunscreen liberally and repeatedly throughout the day.  Whenever you are outside, protect yourself by wearing long sleeves, pants, a wide-brimmed hat, and sunglasses. What should I know about osteoporosis? Osteoporosis is a condition in which bone destruction happens more quickly than new bone creation. After menopause, you may be at an increased risk for osteoporosis. To help prevent osteoporosis or the bone fractures that can happen because of osteoporosis, the following is recommended:  If you are 65-2 years old, get at least 1,000 mg of calcium and at least 600 mg of vitamin D per day.  If you are older than age 23 but younger than age 68, get at least 1,200 mg of calcium and at  least 600 mg of vitamin D per day.  If you are older than age 58, get at least 1,200 mg of calcium and at least 800 mg of vitamin D per day. Smoking and excessive alcohol intake increase the risk of osteoporosis. Eat foods that are rich in calcium and vitamin D, and do weight-bearing exercises several times each week as directed by your health care provider. What should I know about how menopause affects my mental health? Depression may occur at any age, but it is more common as you become older. Common symptoms of depression include:  Low or sad mood.  Changes in sleep patterns.  Changes in appetite or eating patterns.  Feeling an overall lack of motivation or  enjoyment of activities that you previously enjoyed.  Frequent crying spells. Talk with your health care provider if you think that you are experiencing depression. What should I know about immunizations? It is important that you get and maintain your immunizations. These include:  Tetanus, diphtheria, and pertussis (Tdap) booster vaccine.  Influenza every year before the flu season begins.  Pneumonia vaccine.  Shingles vaccine. Your health care provider may also recommend other immunizations. This information is not intended to replace advice given to you by your health care provider. Make sure you discuss any questions you have with your health care provider. Document Released: 10/18/2005 Document Revised: 03/15/2016 Document Reviewed: 05/30/2015 Elsevier Interactive Patient Education  2019 [employer].

## 2019-02-16 DIAGNOSIS — Z780 Asymptomatic menopausal state: Secondary | ICD-10-CM | POA: Insufficient documentation

## 2019-02-16 DIAGNOSIS — M8580 Other specified disorders of bone density and structure, unspecified site: Secondary | ICD-10-CM | POA: Insufficient documentation

## 2019-02-16 NOTE — Assessment & Plan Note (Signed)
Not at goal on amlodipine and losartan.  Adding maxzide

## 2019-02-16 NOTE — Assessment & Plan Note (Signed)

## 2019-02-16 NOTE — Assessment & Plan Note (Signed)
She is due for DEXA this year to assess rate of decline.

## 2019-02-16 NOTE — Assessment & Plan Note (Signed)
Thyroid function was normal in January, but she is waking up feeling "jittery"  .  If thyroid function and A1c are normal,  May need repeat Holter monitor to rule out return of atrial tachycardia or other arrhythmia.

## 2019-02-17 ENCOUNTER — Ambulatory Visit (INDEPENDENT_AMBULATORY_CARE_PROVIDER_SITE_OTHER): Payer: Medicare HMO

## 2019-02-17 ENCOUNTER — Ambulatory Visit: Payer: Medicare HMO

## 2019-02-17 ENCOUNTER — Other Ambulatory Visit: Payer: Self-pay

## 2019-02-17 DIAGNOSIS — Z0000 Encounter for general adult medical examination without abnormal findings: Secondary | ICD-10-CM

## 2019-02-17 NOTE — Patient Instructions (Addendum)
  Wendy Love , Thank you for taking time to come for your Medicare Wellness Visit. I appreciate your ongoing commitment to your health goals. Please review the following plan we discussed and let me know if I can assist you in the future.   These are the goals we discussed: Goals      Patient Stated   . Follow up with Primary Care Provider (pt-stated)     Spot check and log blood pressure daily, keep log and notify pcp of changes       This is a list of the screening recommended for you and due dates:  Health Maintenance  Topic Date Due  . Flu Shot  04/10/2019  . Tetanus Vaccine  02/26/2023  . DEXA scan (bone density measurement)  Completed  . Pneumonia vaccines  Completed

## 2019-02-17 NOTE — Progress Notes (Signed)
Subjective:   Wendy Love is a 78 y.o. female who presents for Medicare Annual (Subsequent) preventive examination.  Review of Systems:  No ROS.  Medicare Wellness Virtual Visit.  Visual/audio telehealth visit, UTA vital signs.   See social history for additional risk factors.   Cardiac Risk Factors include: advanced age (>14men, >83 women);hypertension     Objective:     Vitals: There were no vitals taken for this visit.  There is no height or weight on file to calculate BMI.  Advanced Directives 02/17/2019 04/08/2018 04/08/2017  Does Patient Have a Medical Advance Directive? Yes Yes Yes  Type of Paramedic of [place];Living will [place];Living will [place];Living will  Does patient want to make changes to medical advance directive? No - Patient declined No - Patient declined -  Copy of [place] in Chart? No - copy requested No - copy requested -    Tobacco Social History   Tobacco Use  Smoking Status Never Smoker  Smokeless Tobacco Never Used  Tobacco Comment   smoked socially in college     Counseling given: Not Answered Comment: smoked socially in college   Clinical Intake:  Pre-visit preparation completed: Yes        Diabetes: No  How often do you need to have someone help you when you read instructions, pamphlets, or other written materials from your doctor or pharmacy?: 1 - Never  Interpreter Needed?: No     Past Medical History:  Diagnosis Date  . Anxiety   . Bronchitis   . Degenerative disc disease, lumbar   . Depression   . Endometriosis 1974   s/p abdominal surgery, 2nd surgery  . High cholesterol   . Hypertension   . Hypothyroidism   . Rhinosinusitis   . Sciatica    Past Surgical History:  Procedure Laterality Date  . ABDOMINAL HYSTERECTOMY  2000  . APPENDECTOMY    . CARPAL TUNNEL RELEASE     bilateral  . Endoscopy/colonoscopy  April  2012   Schatski's Ring , small hiatal hernia  . hemilaminectomy  April 2010   L3-L4hemi, L4-L5 microdiskectomy  . KNEE ARTHROSCOPY WITH LATERAL MENISECTOMY Right 04/08/2018   Procedure: KNEE ARTHROSCOPY WITH PARTIAL LATERAL MENISECTOMY and debridement.;  Surgeon: Corky Mull, MD;  Location: [facility];  Service: Orthopedics;  Laterality: Right;   Family History  Problem Relation Age of Onset  . Ovarian cancer Mother   . Cancer Mother 34       ovarian cancer, treated by [facility]  . Heart disease Father    Social History   Socioeconomic History  . Marital status: Widowed    Spouse name: Not on file  . Number of children: Not on file  . Years of education: Not on file  . Highest education level: Not on file  Occupational History  . Not on file  Social Needs  . Financial resource strain: Not hard at all  . Food insecurity:    Worry: Never true    Inability: Never true  . Transportation needs:    Medical: No    Non-medical: No  Tobacco Use  . Smoking status: Never Smoker  . Smokeless tobacco: Never Used  . Tobacco comment: smoked socially in college  Substance and Sexual Activity  . Alcohol use: No  . Drug use: No  . Sexual activity: Not Currently  Lifestyle  . Physical activity:    Days per week: 2  days    Minutes per session: 30 min  . Stress: Not at all  Relationships  . Social connections:    Talks on phone: Not on file    Gets together: Not on file    Attends religious service: Not on file    Active member of club or organization: Not on file    Attends meetings of clubs or organizations: Not on file    Relationship status: Not on file  Other Topics Concern  . Not on file  Social History Narrative  . Not on file    Outpatient Encounter Medications as of 02/17/2019  Medication Sig  . amLODipine (NORVASC) 5 MG tablet Take 1 tablet (5 mg total) by mouth daily.  [name] b complex vitamins tablet Take 1 tablet by mouth daily.    [name] CALCIUM-MAGNESIUM PO Take  by mouth.  . Cholecalciferol (VITAMIN D3) 2000 UNITS TABS Take 5,000 Units by mouth daily.   [name] doxycycline (VIBRA-TABS) 100 MG tablet Take 1 tablet (100 mg total) by mouth 2 (two) times daily.  [name] levothyroxine (SYNTHROID) 75 MCG tablet TAKE 1 TABLET BY MOUTH EVERY DAY  . losartan (COZAAR) 50 MG tablet TAKE 2 TABLETS BY MOUTH EVERY DAY  . Magnesium 300 MG CAPS Take by mouth daily.  . Multiple Vitamin (MULTIVITAMIN) tablet Take 1 tablet by mouth daily.  . Omega-3 Fatty Acids (FISH OIL PO) Take by mouth daily.  [name] PROBIOTIC CAPS Take by mouth daily.    . temazepam (RESTORIL) 15 MG capsule TAKE 1 CAPSULE BY MOUTH AT BEDTIME  . tretinoin (RETIN-A) 0.025 % cream APPLY TO WHOLE FACE EACH NIGHT.  . Triamcinolone Acetonide (NASACORT ALLERGY 24HR NA) Place 1 spray into the nose daily as needed.   . triamterene-hydrochlorothiazide (MAXZIDE-25) 37.5-25 MG tablet Take 1 tablet by mouth daily.  . Turmeric 500 MG CAPS Take 500 mg by mouth 2 (two) times daily.  . vitamin C (ASCORBIC ACID) 500 MG tablet Take 1,000 mg by mouth daily.   . Zinc 25 MG TABS Take 25 mg by mouth daily.   No facility-administered encounter medications on file as of 02/17/2019.     Activities of Daily Living In your present state of health, do you have any difficulty performing the following activities: 02/17/2019 04/08/2018  Hearing? N N  Vision? N N  Difficulty concentrating or making decisions? N N  Walking or climbing stairs? Y N  Comment R knee pain, she paces herself when climbing stairs -  Dressing or bathing? N N  Doing errands, shopping? N -  Preparing Food and eating ? N -  Using the Toilet? N -  In the past six months, have you accidently leaked urine? N -  Do you have problems with loss of bowel control? N -  Managing your Medications? N -  Managing your Finances? N -  Housekeeping or managing your Housekeeping? N -  Some recent data might be hidden    Patient Care Team: Crecencio Mc, MD as PCP - General  (Internal Medicine)    Assessment:   This is a routine wellness examination for Wendy Love.  I connected with patient 02/17/19 at 11:30 AM EDT by a video/audio enabled telemedicine application and verified that I am speaking with the correct person using two identifiers. Patient stated full name and DOB. Patient gave permission to continue with virtual visit. Patient's location was at home and Nurse's location was at [place] office.   Health Screenings  Mammogram - scheduled tomorrow Colonoscopy -  12/2010 Bone Density - 07/2014; she plans to schedule. Ordered by pcp.  Glaucoma -none Hearing -demonstrates normal hearing during visit. Hemoglobin A1C - 08/2018 Cholesterol - 09/2018 Dental- every 6 months Vision- visits within the last 6 months.  Social  Alcohol intake - no          Smoking history- never    Smokers in home? none Illicit drug use? none Exercise - walking twice weekly, 30 minutes Diet - gluten fee diet Sexually Active -never BMI- discussed the importance of a healthy diet, water intake and the benefits of aerobic exercise.  Educational material provided.   Safety  Patient feels safe at home- yes. Lives alone. Patient does have smoke detectors at home- yes Patient does wear sunscreen or protective clothing when in direct sunlight -yes Patient does wear seat belt when in a moving vehicle -yes Patient drives- yes  [id number] precautions and sickness symptoms discussed.   Activities of Daily Living Patient denies needing assistance with: driving, household chores, feeding themselves, getting from bed to chair, getting to the toilet, bathing/showering, dressing, managing money, or preparing meals.  No new identified risk were noted.    Depression Screen Patient denies losing interest in daily life, feeling hopeless, or crying easily over simple problems.   Medication-taking as directed and without issues.   Fall Screen Patient denies being afraid of falling or falling  in the last year.   Memory Screen Patient is alert.  Patient denies difficulty focusing, concentrating or misplacing items. Correctly identified the president of the [place], season and recall. Patient likes to read and play word puzzles for brain stimulation.  Immunizations The following Immunizations were discussed: Influenza, shingles, pneumonia, and tetanus.   Other Providers Patient Care Team: Crecencio Mc, MD as PCP - General (Internal Medicine)  Exercise Activities and Dietary recommendations Current Exercise Habits: Home exercise routine, Type of exercise: walking, Time (Minutes): 30, Frequency (Times/Week): 2, Weekly Exercise (Minutes/Week): 60, Intensity: Mild  Goals      Patient Stated   . Follow up with Primary Care Provider (pt-stated)     Spot check and log blood pressure daily, keep log and notify pcp of changes       Fall Risk Fall Risk  02/17/2019 12/30/2017 07/01/2017 12/05/2015 05/30/2014  Falls in the past year? 0 No Yes No Yes  Number falls in past yr: - - 1 - 1  Injury with Fall? - - No - No  Risk for fall due to : - - - - Other (Comment)   Depression Screen PHQ 2/9 Scores 02/17/2019 12/30/2017 12/05/2015 05/30/2014  PHQ - 2 Score 0 0 0 0  PHQ- 9 Score - 1 - -     Cognitive Function     6CIT Screen 02/17/2019  What Year? 0 points  What month? 0 points  What time? 0 points  Count back from 20 0 points  Months in reverse 0 points  Repeat phrase 0 points  Total Score 0    Immunization History  Administered Date(s) Administered  . Influenza Split 06/10/2012, 06/09/2013  . Influenza Whole 06/06/2009  . Pneumococcal Conjugate-13 07/04/2015  . Pneumococcal Polysaccharide-23 04/24/2009  . Td 06/06/2009, 02/25/2013  . Zoster 05/28/2010   Screening Tests Health Maintenance  Topic Date Due  . INFLUENZA VACCINE  04/10/2019  . TETANUS/TDAP  02/26/2023  . DEXA SCAN  Completed  . PNA vac Low Risk Adult  Completed      Plan:    End of life planning;  Advance  aging; Advanced directives discussed.  Copy of current HCPOA/Living Will requested.    I have personally reviewed and noted the following in the patient's chart:   . Medical and social history . Use of alcohol, tobacco or illicit drugs  . Current medications and supplements . Functional ability and status . Nutritional status . Physical activity . Advanced directives . List of other physicians . Hospitalizations, surgeries, and ER visits in previous 12 months . Vitals . Screenings to include cognitive, depression, and falls . Referrals and appointments  In addition, I have reviewed and discussed with patient certain preventive protocols, quality metrics, and best practice recommendations. A written personalized care plan for preventive services as well as general preventive health recommendations were provided to patient.     OBrien-Blaney, Amilia Vandenbrink L, LPN  2/44/0102     I have reviewed the above information and agree with above.   Deborra Medina, MD

## 2019-02-18 ENCOUNTER — Other Ambulatory Visit: Payer: Self-pay

## 2019-02-18 ENCOUNTER — Ambulatory Visit
Admission: RE | Admit: 2019-02-18 | Discharge: 2019-02-18 | Disposition: A | Discharge status: Home / Self Care | Payer: Medicare HMO | Source: Ambulatory Visit | Attending: Internal Medicine | Admitting: Internal Medicine

## 2019-02-18 DIAGNOSIS — Z1231 Encounter for screening mammogram for malignant neoplasm of breast: Secondary | ICD-10-CM | POA: Diagnosis not present

## 2019-02-18 DIAGNOSIS — Z1239 Encounter for other screening for malignant neoplasm of breast: Secondary | ICD-10-CM

## 2019-02-18 DIAGNOSIS — R928 Other abnormal and inconclusive findings on diagnostic imaging of breast: Secondary | ICD-10-CM | POA: Insufficient documentation

## 2019-02-18 HISTORY — DX: Malignant (primary) neoplasm, unspecified: C80.1

## 2019-02-19 ENCOUNTER — Other Ambulatory Visit: Payer: Self-pay | Admitting: Internal Medicine

## 2019-02-19 DIAGNOSIS — R928 Other abnormal and inconclusive findings on diagnostic imaging of breast: Secondary | ICD-10-CM

## 2019-02-19 DIAGNOSIS — N6489 Other specified disorders of breast: Secondary | ICD-10-CM

## 2019-02-22 DIAGNOSIS — Z9889 Other specified postprocedural states: Secondary | ICD-10-CM | POA: Diagnosis not present

## 2019-02-22 DIAGNOSIS — M25461 Effusion, right knee: Secondary | ICD-10-CM | POA: Diagnosis not present

## 2019-02-22 DIAGNOSIS — M1711 Unilateral primary osteoarthritis, right knee: Secondary | ICD-10-CM | POA: Diagnosis not present

## 2019-02-25 ENCOUNTER — Ambulatory Visit
Admission: RE | Admit: 2019-02-25 | Discharge: 2019-02-25 | Disposition: A | Discharge status: Home / Self Care | Payer: Medicare HMO | Source: Ambulatory Visit | Attending: Internal Medicine | Admitting: Internal Medicine

## 2019-02-25 ENCOUNTER — Other Ambulatory Visit: Payer: Self-pay

## 2019-02-25 ENCOUNTER — Other Ambulatory Visit (INDEPENDENT_AMBULATORY_CARE_PROVIDER_SITE_OTHER): Payer: Medicare HMO

## 2019-02-25 ENCOUNTER — Telehealth: Payer: Self-pay | Admitting: Internal Medicine

## 2019-02-25 DIAGNOSIS — R928 Other abnormal and inconclusive findings on diagnostic imaging of breast: Secondary | ICD-10-CM | POA: Diagnosis not present

## 2019-02-25 DIAGNOSIS — N6489 Other specified disorders of breast: Secondary | ICD-10-CM | POA: Diagnosis not present

## 2019-02-25 DIAGNOSIS — R7301 Impaired fasting glucose: Secondary | ICD-10-CM

## 2019-02-25 DIAGNOSIS — I10 Essential (primary) hypertension: Secondary | ICD-10-CM

## 2019-02-25 DIAGNOSIS — E034 Atrophy of thyroid (acquired): Secondary | ICD-10-CM | POA: Diagnosis not present

## 2019-02-25 DIAGNOSIS — R922 Inconclusive mammogram: Secondary | ICD-10-CM | POA: Diagnosis not present

## 2019-02-25 LAB — COMPREHENSIVE METABOLIC PANEL
ALT: 25 U/L (ref 0–35)
AST: 23 U/L (ref 0–37)
Albumin: 4.5 g/dL (ref 3.5–5.2)
Alkaline Phosphatase: 70 U/L (ref 39–117)
BUN: 13 mg/dL (ref 6–23)
CO2: 28 mEq/L (ref 19–32)
Calcium: 9.9 mg/dL (ref 8.4–10.5)
Chloride: 91 mEq/L — ABNORMAL LOW (ref 96–112)
Creatinine, Ser: 0.6 mg/dL (ref 0.40–1.20)
GFR: 96.5 mL/min (ref >60.00)
Glucose, Bld: 83 mg/dL (ref 70–99)
Potassium: 5.2 mEq/L — ABNORMAL HIGH (ref 3.5–5.1)
Sodium: 125 mEq/L — ABNORMAL LOW (ref 135–145)
Total Bilirubin: 0.8 mg/dL (ref 0.2–1.2)
Total Protein: 6.9 g/dL (ref 6.0–8.3)

## 2019-02-25 LAB — TSH: TSH: 2.53 u[IU]/mL (ref 0.35–4.50)

## 2019-02-25 LAB — LIPID PANEL
Cholesterol: 247 mg/dL — ABNORMAL HIGH (ref 0–200)
HDL: 105.9 mg/dL (ref >39.00)
LDL Cholesterol: 126 mg/dL — ABNORMAL HIGH (ref 0–99)
NonHDL: 141.5
Total CHOL/HDL Ratio: 2
Triglycerides: 77 mg/dL (ref 0.0–149.0)
VLDL: 15.4 mg/dL (ref 0.0–40.0)

## 2019-02-25 LAB — HEMOGLOBIN A1C: Hgb A1c MFr Bld: 5.4 % (ref 4.6–6.5)

## 2019-02-25 NOTE — Telephone Encounter (Signed)
Pt left a envelope with some things she would like to discuss on her appt on 03/01/2019. It's up front in color folder. Thank you!

## 2019-02-26 NOTE — Telephone Encounter (Signed)
Placed on the keyboard on your desk.

## 2019-03-01 ENCOUNTER — Telehealth: Payer: Self-pay

## 2019-03-01 ENCOUNTER — Other Ambulatory Visit: Payer: Self-pay

## 2019-03-01 ENCOUNTER — Encounter: Payer: Self-pay | Admitting: Internal Medicine

## 2019-03-01 ENCOUNTER — Ambulatory Visit (INDEPENDENT_AMBULATORY_CARE_PROVIDER_SITE_OTHER): Payer: Medicare HMO | Admitting: Internal Medicine

## 2019-03-01 DIAGNOSIS — M7051 Other bursitis of knee, right knee: Secondary | ICD-10-CM

## 2019-03-01 DIAGNOSIS — E871 Hypo-osmolality and hyponatremia: Secondary | ICD-10-CM

## 2019-03-01 DIAGNOSIS — M7052 Other bursitis of knee, left knee: Secondary | ICD-10-CM | POA: Diagnosis not present

## 2019-03-01 NOTE — Progress Notes (Signed)
Telephone Visit   This visit type was conducted due to national recommendations for restrictions regarding the COVID-19 pandemic (e.g. social distancing).  This format is felt to be most appropriate for this patient at this time.  All issues noted in this document were discussed and addressed.  No physical exam was performed (except for noted visual exam findings with Video Visits).   I connected with@ on 03/01/19 at  8:00 AM EDT by  telephone and verified that I am speaking with the correct person using two identifiers. Location patient: home Location provider: work or home office Persons participating in the virtual visit: patient, provider  I discussed the limitations, risks, security and privacy concerns of performing an evaluation and management service by telephone and the availability of in person appointments. I also discussed with the patient that there may be a patient responsible charge related to this service. The patient expressed understanding and agreed to proceed.  Reason for visit:  medication side effect   HPI:  78 yr old female with  HTN presents with multiple complaints after starting maxzide two weeks ago for hypertension not at goal.  Symptoms  Are ongoing and include mild headache  nausea, flushing,  palpitations, sleeplessness,    Muscles twitching and tinnitus. "I fell like my adrenals are on overdrive."   She had comprehensive labs done on Jun 18  which note significant hyponatremia (sodium of 125) all other labs including thyroid functio were  were normal   Also had an I/A steroid injection following aspiration of fluid from knee af ter her last visit , knee feels better.    ROS: See pertinent positives and negatives per HPI.  Past Medical History:  Diagnosis Date  . Anxiety   . Bronchitis   . Cancer (HCC)    squamous cell  . Degenerative disc disease, lumbar   . Depression   . Endometriosis 1974   s/p abdominal surgery, 2nd surgery  . High cholesterol   .  Hypertension   . Hypothyroidism   . Rhinosinusitis   . Sciatica     Past Surgical History:  Procedure Laterality Date  . ABDOMINAL HYSTERECTOMY  2000  . APPENDECTOMY    . CARPAL TUNNEL RELEASE     bilateral  . Endoscopy/colonoscopy  April 2012   Schatski's Ring , small hiatal hernia  . hemilaminectomy  April 2010   L3-L4hemi, L4-L5 microdiskectomy  . KNEE ARTHROSCOPY WITH LATERAL MENISECTOMY Right 04/08/2018   Procedure: KNEE ARTHROSCOPY WITH PARTIAL LATERAL MENISECTOMY and debridement.;  Surgeon: Corky Mull, MD;  Location: [facility];  Service: Orthopedics;  Laterality: Right;    Family History  Problem Relation Age of Onset  . Ovarian cancer Mother   . Cancer Mother 70       ovarian cancer, treated by [facility]  . Heart disease Father   . Breast cancer Neg Hx     SOCIAL HX:  reports that she has never smoked. She has never used smokeless tobacco. She reports that she does not drink alcohol or use drugs.   Current Outpatient Medications:  .  amLODipine (NORVASC) 5 MG tablet, Take 1 tablet (5 mg total) by mouth daily., Disp: 90 tablet, Rfl: 1 .  b complex vitamins tablet, Take 1 tablet by mouth daily.  , Disp: , Rfl:  .  CALCIUM-MAGNESIUM PO, Take by mouth., Disp: , Rfl:  .  Cholecalciferol (VITAMIN D3) 2000 UNITS TABS, Take 5,000 Units by mouth daily. , Disp: , Rfl:  .  doxycycline (  VIBRA-TABS) 100 MG tablet, Take 1 tablet (100 mg total) by mouth 2 (two) times daily., Disp: 20 tablet, Rfl: 0 .  levothyroxine (SYNTHROID) 75 MCG tablet, TAKE 1 TABLET BY MOUTH EVERY DAY, Disp: 90 tablet, Rfl: 0 .  losartan (COZAAR) 50 MG tablet, TAKE 2 TABLETS BY MOUTH EVERY DAY, Disp: 180 tablet, Rfl: 1 .  Magnesium 300 MG CAPS, Take by mouth daily., Disp: , Rfl:  .  Multiple Vitamin (MULTIVITAMIN) tablet, Take 1 tablet by mouth daily., Disp: , Rfl:  .  Omega-3 Fatty Acids (FISH OIL PO), Take by mouth daily., Disp: , Rfl:  .  PROBIOTIC CAPS, Take by mouth daily.  , Disp: , Rfl:  .   temazepam (RESTORIL) 15 MG capsule, TAKE 1 CAPSULE BY MOUTH AT BEDTIME, Disp: 30 capsule, Rfl: 5 .  tretinoin (RETIN-A) 0.025 % cream, APPLY TO WHOLE FACE EACH NIGHT., Disp: , Rfl: 5 .  Triamcinolone Acetonide (NASACORT ALLERGY 24HR NA), Place 1 spray into the nose daily as needed. , Disp: , Rfl:  .  Turmeric 500 MG CAPS, Take 500 mg by mouth 2 (two) times daily., Disp: , Rfl:  .  vitamin C (ASCORBIC ACID) 500 MG tablet, Take 1,000 mg by mouth daily. , Disp: , Rfl:  .  Zinc 25 MG TABS, Take 25 mg by mouth daily., Disp: , Rfl:   EXAM:  General impression: alert, cooperative and articulate.  No signs of being in distress  Lungs: speech is fluent sentence length suggests that patient is not short of breath and not punctuated by cough, sneezing or sniffing. [name]   Psych: affect normal.  speech is articulate and non pressured .  Denies suicidal thoughts.    ASSESSMENT AND PLAN:   Acute hyponatremia Secondary to maxzide.  Medication stopped.  Patient advised to salt load today and return on Thursday for repeat BMET .  hctz and maxzide have been added to list of allergies as contraindications  Bursitis, knee S/p aspiration of 20 ccs fluid and s/p Kenalog injection by Orthopedics  June 2020 with good results     I discussed the assessment and treatment plan with the patient. The patient was provided an opportunity to ask questions and all were answered. The patient agreed with the plan and demonstrated an understanding of the instructions.   The patient was advised to call back or seek an in-person evaluation if the symptoms worsen or if the condition fails to improve as anticipated.  I provided 22 minutes of non-face-to-face time during this encounter.   Crecencio Mc, MD

## 2019-03-01 NOTE — Assessment & Plan Note (Signed)
S/p aspiration of 20 ccs fluid and s/p Kenalog injection by Orthopedics  June 2020 with good results

## 2019-03-01 NOTE — Telephone Encounter (Signed)
-----   Message from Crecencio Mc, MD sent at 03/01/2019  8:22 AM EDT ----- Needs non fasting bmet on Thursday June 26

## 2019-03-01 NOTE — Progress Notes (Signed)
Pt was switched to Triamterene-HCTZ 2 weeks ago and has been experiencing some side effects to the medication. Pt stated that her side effect symptoms are headache, palpitations, muscle weakness in arms and legs, restless legs, trouble sleeping and ringing in her ears.  Pt stated that she has not stopped taking it.   BP this morning 129/69  P: 64

## 2019-03-01 NOTE — Assessment & Plan Note (Signed)
Secondary to maxzide.  Medication stopped.  Patient advised to salt load today and return on Thursday for repeat BMET .  hctz and maxzide have been added to list of allergies as contraindications

## 2019-03-01 NOTE — Telephone Encounter (Signed)
Spoke with pt and scheduled her a lab appt on Thursday. Pt is aware of appt date and time.

## 2019-03-04 ENCOUNTER — Other Ambulatory Visit (INDEPENDENT_AMBULATORY_CARE_PROVIDER_SITE_OTHER): Payer: Medicare HMO

## 2019-03-04 ENCOUNTER — Other Ambulatory Visit: Payer: Self-pay

## 2019-03-04 DIAGNOSIS — E871 Hypo-osmolality and hyponatremia: Secondary | ICD-10-CM | POA: Diagnosis not present

## 2019-03-04 LAB — BASIC METABOLIC PANEL
BUN: 9 mg/dL (ref 6–23)
CO2: 31 mEq/L (ref 19–32)
Calcium: 9.4 mg/dL (ref 8.4–10.5)
Chloride: 97 mEq/L (ref 96–112)
Creatinine, Ser: 0.63 mg/dL (ref 0.40–1.20)
GFR: 91.21 mL/min (ref >60.00)
Glucose, Bld: 77 mg/dL (ref 70–99)
Potassium: 4.6 mEq/L (ref 3.5–5.1)
Sodium: 133 mEq/L — ABNORMAL LOW (ref 135–145)

## 2019-03-06 ENCOUNTER — Other Ambulatory Visit: Payer: Self-pay | Admitting: Internal Medicine

## 2019-03-06 DIAGNOSIS — I10 Essential (primary) hypertension: Secondary | ICD-10-CM

## 2019-03-09 DIAGNOSIS — Z780 Asymptomatic menopausal state: Secondary | ICD-10-CM | POA: Diagnosis not present

## 2019-03-09 DIAGNOSIS — M85852 Other specified disorders of bone density and structure, left thigh: Secondary | ICD-10-CM | POA: Diagnosis not present

## 2019-03-12 ENCOUNTER — Other Ambulatory Visit: Payer: Self-pay | Admitting: Internal Medicine

## 2019-03-15 NOTE — Telephone Encounter (Signed)
Refilled: 09/11/2018 Last OV: 03/01/2019 Next OV: not scheduled

## 2019-03-17 DIAGNOSIS — M1711 Unilateral primary osteoarthritis, right knee: Secondary | ICD-10-CM | POA: Diagnosis not present

## 2019-03-17 DIAGNOSIS — M25461 Effusion, right knee: Secondary | ICD-10-CM | POA: Diagnosis not present

## 2019-03-25 DIAGNOSIS — R69 Illness, unspecified: Secondary | ICD-10-CM | POA: Diagnosis not present

## 2019-03-31 ENCOUNTER — Telehealth: Payer: Self-pay | Admitting: Internal Medicine

## 2019-03-31 NOTE — Telephone Encounter (Signed)
My Chart message sent

## 2019-04-15 DIAGNOSIS — R69 Illness, unspecified: Secondary | ICD-10-CM | POA: Diagnosis not present

## 2019-05-18 DIAGNOSIS — H40003 Preglaucoma, unspecified, bilateral: Secondary | ICD-10-CM | POA: Diagnosis not present

## 2019-05-19 ENCOUNTER — Other Ambulatory Visit: Payer: Self-pay | Admitting: Internal Medicine

## 2019-07-04 ENCOUNTER — Other Ambulatory Visit: Payer: Self-pay | Admitting: Internal Medicine

## 2019-08-16 ENCOUNTER — Other Ambulatory Visit: Payer: Self-pay | Admitting: Internal Medicine

## 2019-08-16 DIAGNOSIS — I10 Essential (primary) hypertension: Secondary | ICD-10-CM

## 2019-09-06 ENCOUNTER — Other Ambulatory Visit: Payer: Self-pay

## 2019-09-06 ENCOUNTER — Ambulatory Visit (INDEPENDENT_AMBULATORY_CARE_PROVIDER_SITE_OTHER): Payer: Medicare HMO | Admitting: Internal Medicine

## 2019-09-06 ENCOUNTER — Telehealth: Payer: Self-pay | Admitting: Internal Medicine

## 2019-09-06 ENCOUNTER — Encounter: Payer: Self-pay | Admitting: Internal Medicine

## 2019-09-06 VITALS — Ht 63.0 in | Wt 132.0 lb

## 2019-09-06 DIAGNOSIS — I10 Essential (primary) hypertension: Secondary | ICD-10-CM

## 2019-09-06 DIAGNOSIS — E039 Hypothyroidism, unspecified: Secondary | ICD-10-CM

## 2019-09-06 DIAGNOSIS — E782 Mixed hyperlipidemia: Secondary | ICD-10-CM

## 2019-09-06 DIAGNOSIS — R7301 Impaired fasting glucose: Secondary | ICD-10-CM

## 2019-09-06 DIAGNOSIS — K5792 Diverticulitis of intestine, part unspecified, without perforation or abscess without bleeding: Secondary | ICD-10-CM | POA: Diagnosis not present

## 2019-09-06 NOTE — Telephone Encounter (Signed)
Called pt to set up a fasting lab and a 3week office visit, per [place]. Pt stated she is going to go be tested for covid. Pt was advised she can not come into office until 21 days after covid test. Patient will call back and make appt.

## 2019-09-06 NOTE — Assessment & Plan Note (Signed)
Well controlled on current regimen. Renal function assessment is due , no changes today.

## 2019-09-06 NOTE — Patient Instructions (Signed)
I agree with getting COVID TESTING at the [facility] urgent care or at [facility]  Your current symptoms sound like resolving diverticulitis  Clear liquid diet  Call for CT scan if symptoms return or do not resolve

## 2019-09-06 NOTE — Progress Notes (Signed)
Virtual Visit via [email or web address]  This visit type was conducted due to national recommendations for restrictions regarding the COVID-19 pandemic (e.g. social distancing).  This format is felt to be most appropriate for this patient at this time.  All issues noted in this document were discussed and addressed.  No physical exam was performed (except for noted visual exam findings with Video Visits).   I connected with@ on 09/06/19 at 10:30 AM EST by a video enabled telemedicine application and verified that I am speaking with the correct person using two identifiers. Location patient: home Location provider: work or home office Persons participating in the virtual visit: patient, provider  I discussed the limitations, risks, security and privacy concerns of performing an evaluation and management service by telephone and the availability of in person appointments. I also discussed with the patient that there may be a patient responsible charge related to this service. The patient expressed understanding and agreed to proceed.   Reason for visit: abd pain    HPI:  78 yr old female with mild sigmoid diverticulosis by last colonoscopy 2012 presents with 3 day history of low grade fevers (99 to 100 per patient),  Lower Abdominal discomfort/cramping  and pressure with BMs . No dysuria or back pain.  No shortness of breath or cough. No nausea .  Fevers have resolved over the last 24 hours and the BMS are regular in form and frequency   The patient has no signs or symptoms of COVID 19 infection (fever, cough, sore throat  or shortness of breath beyond what is typical for patient).  Patient denies contact with other persons with the above mentioned symptoms or with anyone confirmed to have COVID 19    The patient feels generally well, is walking several times per week for exercise and weighing weekly .  Following a carbohydrate modified diet 5 days per week. Denies any significant weight change, and has no  symptoms suggestive of diabetes (polyuria, polyphagia, polydipsia).    Hypertension: patient checks blood pressure twice weekly at home.  Readings have been for the most part< 140/80 at rest . Patient is following a reduce salt diet most days and is taking medications as prescribed   ROS: See pertinent positives and negatives per HPI.  Past Medical History:  Diagnosis Date  . Anxiety   . Bronchitis   . Cancer (HCC)    squamous cell  . Degenerative disc disease, lumbar   . Depression   . Endometriosis 1974   s/p abdominal surgery, 2nd surgery  . High cholesterol   . Hypertension   . Hypothyroidism   . Rhinosinusitis   . Sciatica     Past Surgical History:  Procedure Laterality Date  . ABDOMINAL HYSTERECTOMY  2000  . APPENDECTOMY    . CARPAL TUNNEL RELEASE     bilateral  . Endoscopy/colonoscopy  April 2012   Schatski's Ring , small hiatal hernia  . hemilaminectomy  April 2010   L3-L4hemi, L4-L5 microdiskectomy  . KNEE ARTHROSCOPY WITH LATERAL MENISECTOMY Right 04/08/2018   Procedure: KNEE ARTHROSCOPY WITH PARTIAL LATERAL MENISECTOMY and debridement.;  Surgeon: Corky Mull, MD;  Location: [place];  Service: Orthopedics;  Laterality: Right;    Family History  Problem Relation Age of Onset  . Ovarian cancer Mother   . Cancer Mother 23       ovarian cancer, treated by [facility]  . Heart disease Father   . Breast cancer Neg Hx      SOCIAL HX:  reports that she has never smoked. She has never used smokeless tobacco. She reports that she does not drink alcohol or use drugs.   Current Outpatient Medications:  .  amLODipine (NORVASC) 5 MG tablet, TAKE 1 TABLET BY MOUTH EVERY DAY, Disp: 90 tablet, Rfl: 1 .  b complex vitamins tablet, Take 1 tablet by mouth daily.  , Disp: , Rfl:  .  CALCIUM-MAGNESIUM PO, Take by mouth., Disp: , Rfl:  .  chlorhexidine (PERIDEX) 0.12 % solution, SMARTSIG:15 Milliliter(s) By Mouth Morning-Night, Disp: , Rfl:  .  Cholecalciferol  (VITAMIN D3) 2000 UNITS TABS, Take 5,000 Units by mouth daily. , Disp: , Rfl:  .  levothyroxine (SYNTHROID) 75 MCG tablet, TAKE 1 TABLET BY MOUTH EVERY DAY, Disp: 90 tablet, Rfl: 0 .  losartan (COZAAR) 50 MG tablet, TAKE 2 TABLETS BY MOUTH EVERY DAY, Disp: 180 tablet, Rfl: 1 .  Magnesium 300 MG CAPS, Take by mouth daily., Disp: , Rfl:  .  Multiple Vitamin (MULTIVITAMIN) tablet, Take 1 tablet by mouth daily., Disp: , Rfl:  .  Omega-3 Fatty Acids (FISH OIL PO), Take by mouth daily., Disp: , Rfl:  .  PROBIOTIC CAPS, Take by mouth daily.  , Disp: , Rfl:  .  temazepam (RESTORIL) 15 MG capsule, TAKE 1 CAPSULE BY MOUTH AT BEDTIME, Disp: 30 capsule, Rfl: 5 .  tretinoin (RETIN-A) 0.025 % cream, APPLY TO WHOLE FACE EACH NIGHT., Disp: , Rfl: 5 .  Triamcinolone Acetonide (NASACORT ALLERGY 24HR NA), Place 1 spray into the nose daily as needed. , Disp: , Rfl:  .  Turmeric 500 MG CAPS, Take 500 mg by mouth 2 (two) times daily., Disp: , Rfl:  .  vitamin C (ASCORBIC ACID) 500 MG tablet, Take 1,000 mg by mouth daily. , Disp: , Rfl:  .  Zinc 25 MG TABS, Take 25 mg by mouth daily., Disp: , Rfl:   EXAM:  VITALS per patient if applicable:  GENERAL: alert, oriented, appears well and in no acute distress  HEENT: atraumatic, conjunttiva clear, no obvious abnormalities on inspection of external nose and ears  NECK: normal movements of the head and neck  LUNGS: on inspection no signs of respiratory distress, breathing rate appears normal, no obvious gross SOB, gasping or wheezing  CV: no obvious cyanosis  MS: moves all visible extremities without noticeable abnormality  PSYCH/NEURO: pleasant and cooperative, no obvious depression or anxiety, speech and thought processing grossly intact  ASSESSMENT AND PLAN:  Discussed the following assessment and plan:  Mixed hyperlipidemia - Plan: Lipid panel  Impaired fasting glucose - Plan: Comprehensive metabolic panel, Hemoglobin A1c  Acquired hypothyroidism -  Plan: TSH  Diverticulitis  Essential hypertension  Diverticulitis Symptoms are improving without antibiotics.  Continue clear liquid diet and advance diet from celar liquids once paiin resolves.   Essential hypertension Well controlled on current regimen. Renal function assessment is due , no changes today.    I discussed the assessment and treatment plan with the patient. The patient was provided an opportunity to ask questions and all were answered. The patient agreed with the plan and demonstrated an understanding of the instructions.   The patient was advised to call back or seek an in-person evaluation if the symptoms worsen or if the condition fails to improve as anticipated.   I provided  25 minutes of non-face-to-face time during this encounter reviewing patient's current problems and past procedures/imaging studies, providing counseling on the above mentioned problems , and coordination  of care . Aris Everts  Derrel Nip, MD

## 2019-09-06 NOTE — Assessment & Plan Note (Signed)
Symptoms are improving without antibiotics.  Continue clear liquid diet and advance diet from celar liquids once paiin resolves.

## 2019-09-16 ENCOUNTER — Other Ambulatory Visit: Payer: Self-pay

## 2019-09-16 ENCOUNTER — Other Ambulatory Visit (INDEPENDENT_AMBULATORY_CARE_PROVIDER_SITE_OTHER): Payer: Medicare HMO

## 2019-09-16 DIAGNOSIS — R7301 Impaired fasting glucose: Secondary | ICD-10-CM

## 2019-09-16 DIAGNOSIS — E039 Hypothyroidism, unspecified: Secondary | ICD-10-CM

## 2019-09-16 DIAGNOSIS — E782 Mixed hyperlipidemia: Secondary | ICD-10-CM | POA: Diagnosis not present

## 2019-09-16 LAB — LIPID PANEL
Cholesterol: 221 mg/dL — ABNORMAL HIGH (ref 0–200)
HDL: 85.8 mg/dL (ref >39.00)
LDL Cholesterol: 120 mg/dL — ABNORMAL HIGH (ref 0–99)
NonHDL: 135.52
Total CHOL/HDL Ratio: 3
Triglycerides: 78 mg/dL (ref 0.0–149.0)
VLDL: 15.6 mg/dL (ref 0.0–40.0)

## 2019-09-16 LAB — COMPREHENSIVE METABOLIC PANEL
ALT: 19 U/L (ref 0–35)
AST: 24 U/L (ref 0–37)
Albumin: 4.3 g/dL (ref 3.5–5.2)
Alkaline Phosphatase: 64 U/L (ref 39–117)
BUN: 13 mg/dL (ref 6–23)
CO2: 28 mEq/L (ref 19–32)
Calcium: 9.7 mg/dL (ref 8.4–10.5)
Chloride: 101 mEq/L (ref 96–112)
Creatinine, Ser: 0.6 mg/dL (ref 0.40–1.20)
GFR: 96.36 mL/min (ref >60.00)
Glucose, Bld: 93 mg/dL (ref 70–99)
Potassium: 4.1 mEq/L (ref 3.5–5.1)
Sodium: 137 mEq/L (ref 135–145)
Total Bilirubin: 0.5 mg/dL (ref 0.2–1.2)
Total Protein: 7.3 g/dL (ref 6.0–8.3)

## 2019-09-16 LAB — HEMOGLOBIN A1C: Hgb A1c MFr Bld: 5.2 % (ref 4.6–6.5)

## 2019-09-16 LAB — TSH: TSH: 2.18 u[IU]/mL (ref 0.35–4.50)

## 2019-09-17 ENCOUNTER — Other Ambulatory Visit: Payer: Self-pay

## 2019-09-20 ENCOUNTER — Ambulatory Visit (INDEPENDENT_AMBULATORY_CARE_PROVIDER_SITE_OTHER): Payer: Medicare HMO | Admitting: Internal Medicine

## 2019-09-20 ENCOUNTER — Encounter: Payer: Self-pay | Admitting: Internal Medicine

## 2019-09-20 ENCOUNTER — Other Ambulatory Visit: Payer: Self-pay

## 2019-09-20 VITALS — BP 140/74 | HR 88 | Temp 96.3°F | Resp 15 | Ht 63.0 in | Wt 136.4 lb

## 2019-09-20 DIAGNOSIS — R1030 Lower abdominal pain, unspecified: Secondary | ICD-10-CM

## 2019-09-20 DIAGNOSIS — R7301 Impaired fasting glucose: Secondary | ICD-10-CM | POA: Diagnosis not present

## 2019-09-20 DIAGNOSIS — G4700 Insomnia, unspecified: Secondary | ICD-10-CM | POA: Diagnosis not present

## 2019-09-20 DIAGNOSIS — R195 Other fecal abnormalities: Secondary | ICD-10-CM

## 2019-09-20 DIAGNOSIS — E034 Atrophy of thyroid (acquired): Secondary | ICD-10-CM | POA: Diagnosis not present

## 2019-09-20 DIAGNOSIS — K648 Other hemorrhoids: Secondary | ICD-10-CM

## 2019-09-20 DIAGNOSIS — I739 Peripheral vascular disease, unspecified: Secondary | ICD-10-CM | POA: Diagnosis not present

## 2019-09-20 DIAGNOSIS — K5792 Diverticulitis of intestine, part unspecified, without perforation or abscess without bleeding: Secondary | ICD-10-CM | POA: Diagnosis not present

## 2019-09-20 LAB — POCT URINALYSIS DIPSTICK
Bilirubin, UA: NEGATIVE
Blood, UA: NEGATIVE
Glucose, UA: NEGATIVE
Ketones, UA: NEGATIVE
Leukocytes, UA: NEGATIVE
Nitrite, UA: NEGATIVE
Protein, UA: NEGATIVE
Spec Grav, UA: 1.015 (ref 1.010–1.025)
Urobilinogen, UA: 0.2 E.U./dL
pH, UA: 7.5 (ref 5.0–8.0)

## 2019-09-20 NOTE — Progress Notes (Signed)
Subjective:  Patient ID: Wendy Love, female    DOB: 07-24-1941  Age: 79 y.o. MRN: [id number]  CC: The primary encounter diagnosis was Lower abdominal pain. Diagnoses of Diverticulitis, Hypothyroidism due to acquired atrophy of thyroid, Impaired fasting glucose, Hemorrhoid prolapse, Loose stools, Peripheral artery disease ([place]), Autosomal dominant hereditary hemochromatosis ([place]), and INSOMNIA, CHRONIC were also pertinent to this visit.  HPI Earl W Heilmann presents for evaluation of a rectal discomfort with a new palpable mass presumed ot be a prolapsed hemorrhoid and review of recent fasting labs /chronic conditions  This visit occurred during the SARS-CoV-2 public health emergency.  Safety protocols were in place, including screening questions prior to the visit, additional usage of staff PPE, and extensive cleaning of exam room while observing appropriate contact time as indicated for disinfecting solutions.    She has recently recovered from an episode of self described diverticulitis that lasted about 2 weeks,  . symptoms were vague lower abdominal discomfort and mildly elevated temps (t max 99) without change in bowel habits. The symptoms ended 2 days ago.  She takes 1000 mg magnesium daily to lower BP,  Stools  are typically  have been very soft and occur daily.  However for the last week she has had some rectal discomfort and has felt a  "marble" sized mass that she assumed was a prolapsed hemorrhoid that she has been unable to reduce, so she has  been treating topically with preparation H,   Witch  hazel and epsom salt soaks .  The mass is no longer painful or itchy   She feels generally well,  Following a low GI index diet,  Walking regularly for exercise,  Home BP readings all < 140/80    Outpatient Medications Prior to Visit  Medication Sig Dispense Refill  . amLODipine (NORVASC) 5 MG tablet TAKE 1 TABLET BY MOUTH EVERY DAY 90 tablet 1  . b complex vitamins tablet Take  1 tablet by mouth daily.      [name] CALCIUM-MAGNESIUM PO Take by mouth.    . chlorhexidine (PERIDEX) 0.12 % solution SMARTSIG:15 Milliliter(s) By Mouth Morning-Night    . Cholecalciferol (VITAMIN D3) 2000 UNITS TABS Take 5,000 Units by mouth daily.     [name] levothyroxine (SYNTHROID) 75 MCG tablet TAKE 1 TABLET BY MOUTH EVERY DAY 90 tablet 0  . losartan (COZAAR) 50 MG tablet TAKE 2 TABLETS BY MOUTH EVERY DAY 180 tablet 1  . Magnesium 300 MG CAPS Take by mouth daily.    . Multiple Vitamin (MULTIVITAMIN) tablet Take 1 tablet by mouth daily.    . Omega-3 Fatty Acids (FISH OIL PO) Take by mouth daily.    [name] PROBIOTIC CAPS Take by mouth daily.      . temazepam (RESTORIL) 15 MG capsule TAKE 1 CAPSULE BY MOUTH AT BEDTIME 30 capsule 5  . tretinoin (RETIN-A) 0.025 % cream APPLY TO WHOLE FACE EACH NIGHT.  5  . Triamcinolone Acetonide (NASACORT ALLERGY 24HR NA) Place 1 spray into the nose daily as needed.     . vitamin C (ASCORBIC ACID) 500 MG tablet Take 1,000 mg by mouth daily.     . Zinc 25 MG TABS Take 25 mg by mouth daily.    . Turmeric 500 MG CAPS Take 500 mg by mouth 2 (two) times daily.     No facility-administered medications prior to visit.    Review of Systems;  Patient denies headache, fevers, malaise, unintentional weight loss, skin rash, eye pain, sinus congestion and  sinus pain, sore throat, dysphagia,  hemoptysis , cough, dyspnea, wheezing, chest pain, palpitations, orthopnea, edema, abdominal pain, nausea, melena, diarrhea, constipation, flank pain, dysuria, hematuria, urinary  Frequency, nocturia, numbness, tingling, seizures,  Focal weakness, Loss of consciousness,  Tremor, insomnia, depression, anxiety, and suicidal ideation.      Objective:  BP 140/74 (BP Location: Left Arm, Patient Position: Sitting, Cuff Size: Normal)   Pulse 88   Temp (!) 96.3 F (35.7 C) (Temporal)   Resp 15   Ht 5\' 3"  (1.6 m)   Wt 136 lb 6.4 oz (61.9 kg)   SpO2 99%   BMI 24.16 kg/m   BP Readings from Last  3 Encounters:  09/20/19 140/74  02/15/19 (!) 170/82  09/29/18 (!) 195/90    Wt Readings from Last 3 Encounters:  09/20/19 136 lb 6.4 oz (61.9 kg)  09/06/19 132 lb (59.9 kg)  02/15/19 132 lb 6.4 oz (60.1 kg)   General Appearance:    Alert, cooperative, no distress, appears stated age  Head:    Normocephalic, without obvious abnormality, atraumatic  Eyes:    PERRL, conjunctiva/corneas clear, EOM's intact, fundi    benign, both eyes  Ears:    Normal TM's and external ear canals, both ears  Nose:   Nares normal, septum midline, mucosa normal, no drainage    or sinus tenderness  Throat:   Lips, mucosa, and tongue normal; teeth and gums normal  Neck:   Supple, symmetrical, trachea midline, no adenopathy;    thyroid:  no enlargement/tenderness/nodules; no carotid   bruit or JVD  Back:     Symmetric, no curvature, ROM normal, no CVA tenderness  Lungs:     Clear to auscultation bilaterally, respirations unlabored  Chest Wall:    No tenderness or deformity   Heart:    Regular rate and rhythm, S1 and S2 normal, no murmur, rub   or gallop  Breast Exam:    Deferred y  Abdomen:     Soft, non-tender, bowel sounds active all four quadrants,    no masses, no organomegaly  GYN/Rectal:    Pelvic: cervix normal in appearance, external genitalia normal, no adnexal masses or tenderness, no cervical motion tenderness, rectovaginal septum normal, uterus normal sze, shape, and consistency and vagina normal without discharge PEA SIZED pink mass at 12:00 position of anus without signs of bleeding or thrombosis   Extremities:   Extremities normal, atraumatic, no cyanosis or edema  Pulses:   2+ and symmetric all extremities  Skin:   Skin color, texture, turgor normal, no rashes or lesions  Lymph nodes:   Cervical, supraclavicular, and axillary nodes normal  Neurologic:   CNII-XII intact, normal strength, sensation and reflexes    throughout    Lab Results  Component Value Date   HGBA1C 5.2 09/16/2019    HGBA1C 5.4 02/25/2019   HGBA1C 5.1 08/12/2018    Lab Results  Component Value Date   CREATININE 0.60 09/16/2019   CREATININE 0.63 03/04/2019   CREATININE 0.60 02/25/2019    Lab Results  Component Value Date   WBC 5.1 09/23/2018   HGB 14.5 09/23/2018   HCT 40.5 09/23/2018   PLT 224 09/23/2018   GLUCOSE 93 09/16/2019   CHOL 221 (H) 09/16/2019   TRIG 78.0 09/16/2019   HDL 85.80 09/16/2019   LDLDIRECT 115.0 08/12/2018   LDLCALC 120 (H) 09/16/2019   ALT 19 09/16/2019   AST 24 09/16/2019   NA 137 09/16/2019   K 4.1 09/16/2019   CL 101 09/16/2019  CREATININE 0.60 09/16/2019   BUN 13 09/16/2019   CO2 28 09/16/2019   TSH 2.18 09/16/2019   HGBA1C 5.2 09/16/2019   MICROALBUR 0.2 11/26/2013    MM DIAG BREAST TOMO UNI LEFT  Result Date: 02/25/2019 CLINICAL DATA:  79 year old patient recalled from recent screening mammogram for evaluation possible left breast asymmetry seen in the MLO projection only. EXAM: DIGITAL DIAGNOSTIC UNILATERAL LEFT MAMMOGRAM WITH CAD AND TOMO COMPARISON:  February 18, 2019 ACR Breast Density Category c: The breast tissue is heterogeneously dense, which may obscure small masses. FINDINGS: Spot compression view of the left breast in the MLO projection with tomography shows no persistent asymmetry. 90 degree lateral view of the left breast is negative. Mammographic images were processed with CAD. IMPRESSION: No evidence of malignancy in the left breast. RECOMMENDATION: Screening mammogram in one year.(Code:[id number]-B-01Y) I have discussed the findings and recommendations with the patient. Results were also provided in writing at the conclusion of the visit. If applicable, a reminder letter will be sent to the patient regarding the next appointment. BI-RADS CATEGORY  1: Negative. Electronically Signed   By: Curlene Dolphin M.D.   On: 02/25/2019 14:04    Assessment & Plan:   Problem List Items Addressed This Visit      Unprioritized   Autosomal dominant hereditary  hemochromatosis ([place])   Diverticulitis    Suggested by recent symptoms, now resolved. Patient encouraged to notify me immediately if symptoms return so a CT scan can be done urgently       Hemorrhoid prolapse    Vs enflamed anal papillae (noted on last colonoscopy).  Continue Prep H, increase use to qid,  Add Anusol HC .  If swelling does not resolve in one week,  GI referral recommended       Hypothyroidism    Thyroid function is WNL on current dose.  No current changes needed.  Lab Results  Component Value Date   TSH 2.18 09/16/2019         Impaired fasting glucose    A1c continues to be  normal.  She is not at risk .   Lab Results  Component Value Date   HGBA1C 5.2 09/16/2019         INSOMNIA, CHRONIC    Her insomnia is chronic and started after  the recent death of her husband.   she has been weaning her daily dose of  temazepam.  Bedtime habits reviewed.  The risks and benefits of benzodiazepine use were reviewed with patient today including excessive sedation leading to respiratory depression,  impaired thinking/driving, and addiction.  Patient was advised to avoid concurrent use with alcohol, to use medication only as needed and not to share with others  . [name]       Loose stools    Likely secondray to excessive use of magnesium which she takes to lower her blood pressure . Reduce 1000 mg daily dose to 400 mg,  Add metamucil if needed  To restore normal consistency of stools       Peripheral artery disease ([place])    Other Visit Diagnoses    Lower abdominal pain    -  Primary   Relevant Orders   Urinalysis, Routine w reflex microscopic (not at [facility]) (Completed)   Urine Culture   POCT Urinalysis Dipstick (Completed)      I am having Lotus W. Geralyn Flash "Polly" maintain her b complex vitamins, Vitamin D3, Probiotic, multivitamin, Triamcinolone Acetonide (NASACORT ALLERGY 24HR NA), vitamin C, Magnesium, Omega-3 Fatty  Acids (FISH OIL PO), tretinoin, CALCIUM-MAGNESIUM  PO, Turmeric, Zinc, temazepam, losartan, amLODipine, levothyroxine, and chlorhexidine.  No orders of the defined types were placed in this encounter.   There are no discontinued medications.  Follow-up: No follow-ups on file.   Crecencio Mc, MD

## 2019-09-21 ENCOUNTER — Encounter: Payer: Self-pay | Admitting: Internal Medicine

## 2019-09-21 DIAGNOSIS — K648 Other hemorrhoids: Secondary | ICD-10-CM | POA: Insufficient documentation

## 2019-09-21 DIAGNOSIS — R195 Other fecal abnormalities: Secondary | ICD-10-CM | POA: Insufficient documentation

## 2019-09-21 LAB — URINALYSIS, ROUTINE W REFLEX MICROSCOPIC
Bilirubin Urine: NEGATIVE
Hgb urine dipstick: NEGATIVE
Ketones, ur: NEGATIVE
Leukocytes,Ua: NEGATIVE
Nitrite: NEGATIVE
RBC / HPF: NONE SEEN (ref 0–?)
Specific Gravity, Urine: 1.01 (ref 1.000–1.030)
Total Protein, Urine: NEGATIVE
Urine Glucose: NEGATIVE
Urobilinogen, UA: 0.2 (ref 0.0–1.0)
WBC, UA: NONE SEEN (ref 0–?)
pH: 7.5 (ref 5.0–8.0)

## 2019-09-21 LAB — URINE CULTURE
MICRO NUMBER:: 10027867
Result:: NO GROWTH
SPECIMEN QUALITY:: ADEQUATE

## 2019-09-21 NOTE — Assessment & Plan Note (Signed)
Suggested by recent symptoms, now resolved. Patient encouraged to notify me immediately if symptoms return so a CT scan can be done urgently

## 2019-09-21 NOTE — Assessment & Plan Note (Signed)
A1c continues to be  normal.  She is not at risk .   Lab Results  Component Value Date   HGBA1C 5.2 09/16/2019

## 2019-09-21 NOTE — Assessment & Plan Note (Signed)
Her insomnia is chronic and started after  the recent death of her husband.   she has been weaning her daily dose of  temazepam.  Bedtime habits reviewed.  The risks and benefits of benzodiazepine use were reviewed with patient today including excessive sedation leading to respiratory depression,  impaired thinking/driving, and addiction.  Patient was advised to avoid concurrent use with alcohol, to use medication only as needed and not to share with others  . [name]

## 2019-09-21 NOTE — Assessment & Plan Note (Signed)
Vs enflamed anal papillae (noted on last colonoscopy).  Continue Prep H, increase use to qid,  Add Anusol HC .  If swelling does not resolve in one week,  GI referral recommended

## 2019-09-21 NOTE — Assessment & Plan Note (Signed)
Likely secondray to excessive use of magnesium which she takes to lower her blood pressure . Reduce 1000 mg daily dose to 400 mg,  Add metamucil if needed  To restore normal consistency of stools

## 2019-09-21 NOTE — Assessment & Plan Note (Signed)
Thyroid function is WNL on current dose.  No current changes needed.  Lab Results  Component Value Date   TSH 2.18 09/16/2019

## 2019-09-27 ENCOUNTER — Other Ambulatory Visit: Payer: Self-pay | Admitting: Internal Medicine

## 2019-09-27 NOTE — Telephone Encounter (Signed)
Refilled: 03/15/2019 Last OV: 09/20/2019 Next OV: not scheduled

## 2019-10-01 ENCOUNTER — Encounter (INDEPENDENT_AMBULATORY_CARE_PROVIDER_SITE_OTHER): Payer: Self-pay

## 2019-10-01 ENCOUNTER — Encounter (INDEPENDENT_AMBULATORY_CARE_PROVIDER_SITE_OTHER): Payer: Medicare HMO

## 2019-10-01 ENCOUNTER — Ambulatory Visit (INDEPENDENT_AMBULATORY_CARE_PROVIDER_SITE_OTHER): Payer: Medicare HMO | Admitting: Nurse Practitioner

## 2019-10-13 DIAGNOSIS — Z20828 Contact with and (suspected) exposure to other viral communicable diseases: Secondary | ICD-10-CM | POA: Diagnosis not present

## 2019-10-15 ENCOUNTER — Telehealth: Payer: Self-pay | Admitting: Internal Medicine

## 2019-10-15 NOTE — Telephone Encounter (Signed)
Spoke with pt and informed her of Dr. Tullo's message below. Pt gave a verbal understanding.  

## 2019-10-15 NOTE — Telephone Encounter (Signed)
Pt states that she ran a low grade fever Tuesday-Thursday. Pt tested negative for covid on Wednesday. Pt states that she has muscle aches. Pt wants to speak to Janett Billow about symptoms. Did not want to take available appt with Dr Mclean-Scocuzza.

## 2019-10-15 NOTE — Telephone Encounter (Signed)
Tell her that I have had some patients infected wit COVID NOT have fevers, given her symptomss I would self quarantine  For ten days starting on Monday .  Use tylenol and motrin for the body aches.  She can retest on Monday

## 2019-10-15 NOTE — Telephone Encounter (Signed)
Spoke with pt and she stated that Monday morning she woke up with neck stiffness and by Tuesday she had developed a low grade fever. The pt stated that her temperature was anywhere from 99-100.2. I explained to the pt that we don't consider it a fever until it 100.4 or greater. As far as the neck and shoulder stiffness, muscle aches she is still having them but they are a lot better. Pt stated that she did not sleep on her neck wrong or do anything to pull those muscles. She was tested for covid on Wednesday and got a negative result. Pt was advised to take ibuprofen or tylenol to help with the muscle pain and if she developed any other symptoms to get retested for covid. Pt gave a verbal understanding.

## 2019-10-16 DIAGNOSIS — Z20828 Contact with and (suspected) exposure to other viral communicable diseases: Secondary | ICD-10-CM | POA: Diagnosis not present

## 2019-10-19 NOTE — Telephone Encounter (Signed)
Pt called in said she had second Covid test and it was negative. Pt states she is fine and wanted Dr. Derrel Nip to know.

## 2019-10-20 IMAGING — MG DIGITAL DIAGNOSTIC UNILATERAL LEFT MAMMOGRAM WITH TOMO AND CAD
3 series · 3 of 7 positions shown · non-contrast
Comparison: February 18, 2019

CLINICAL DATA: 77-year-old patient recalled from recent screening
mammogram for evaluation possible left breast asymmetry seen in the
MLO projection only.

EXAM:
DIGITAL DIAGNOSTIC UNILATERAL LEFT MAMMOGRAM WITH CAD AND TOMO

[L MLO synth-2D]
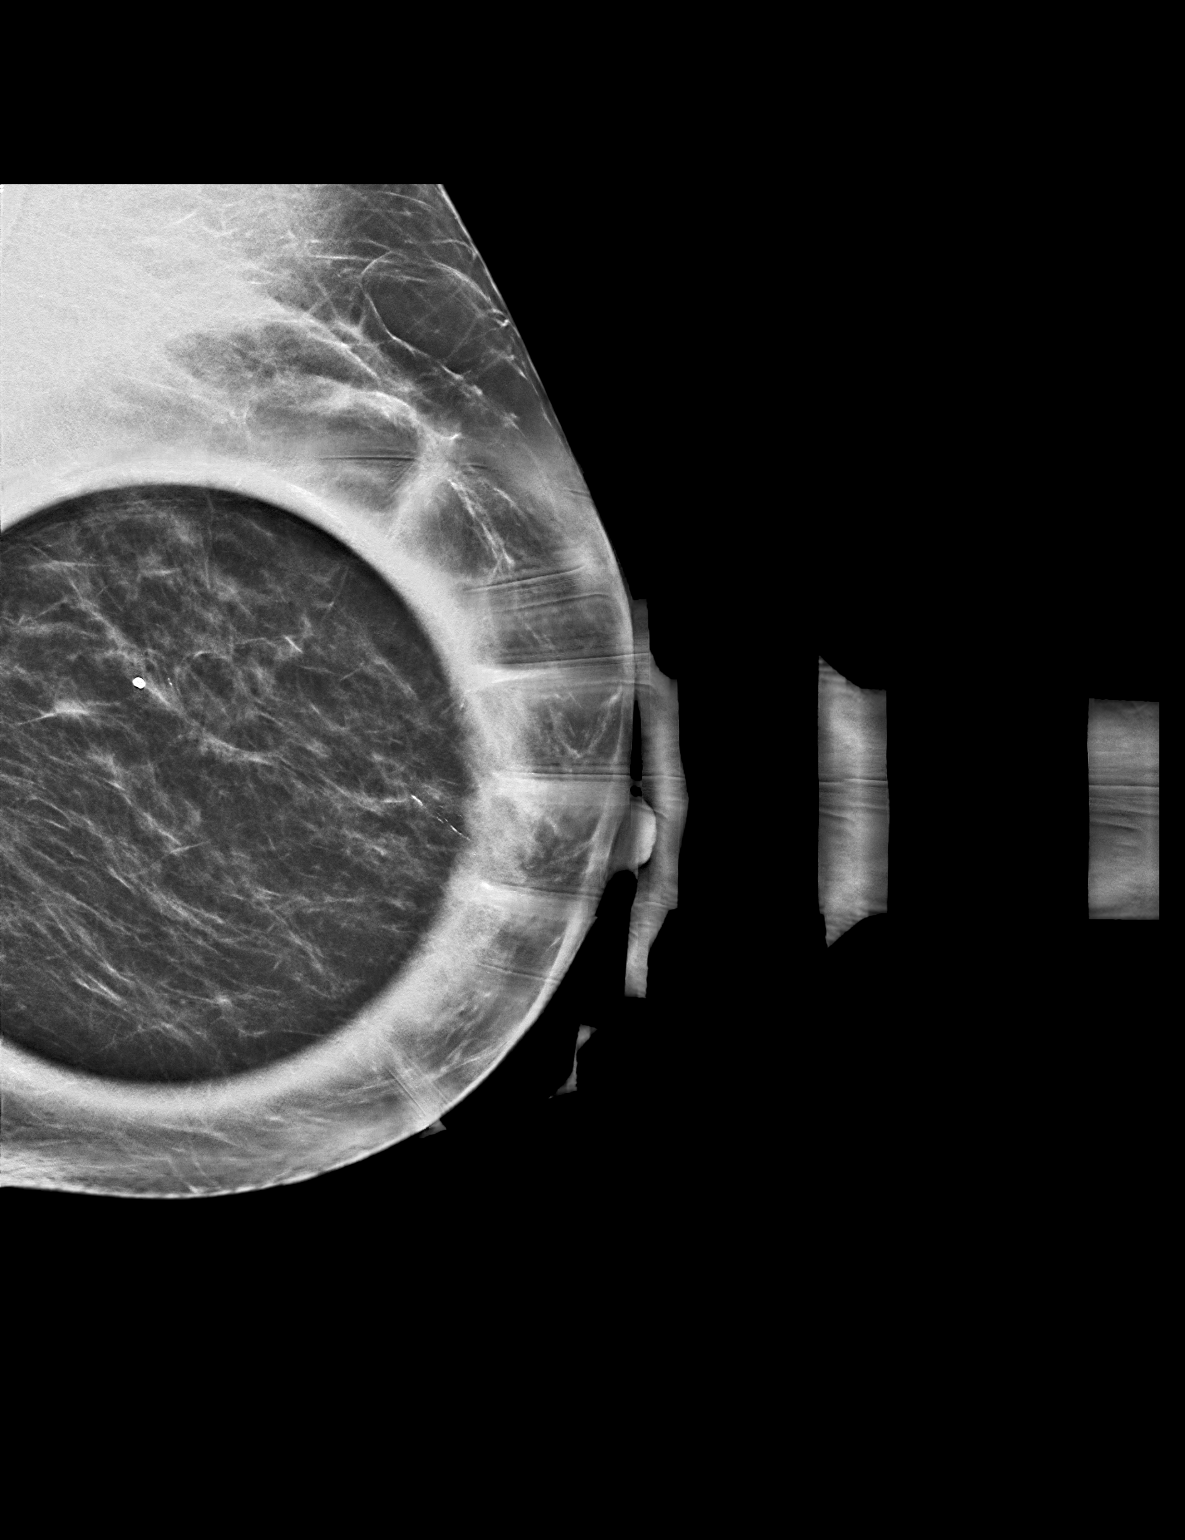

[L ML synth-2D]
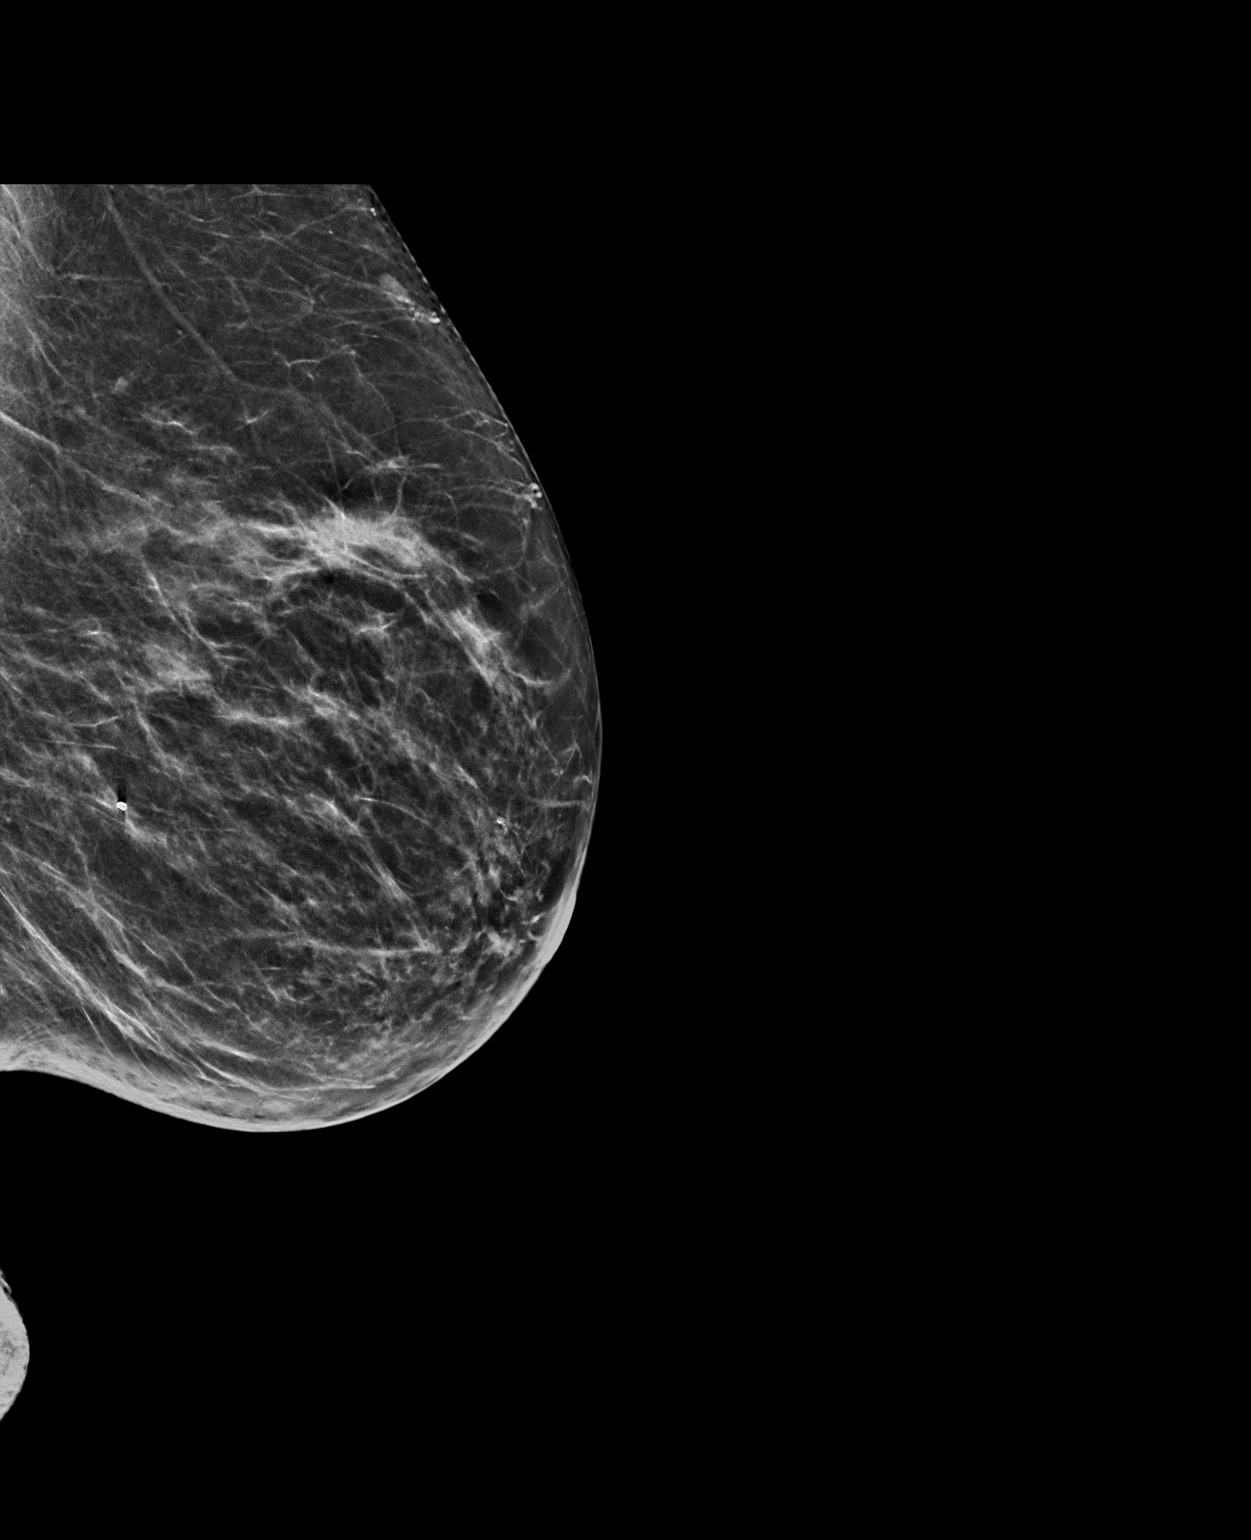

[L MLO tomo · tomo slice 27/52.0]
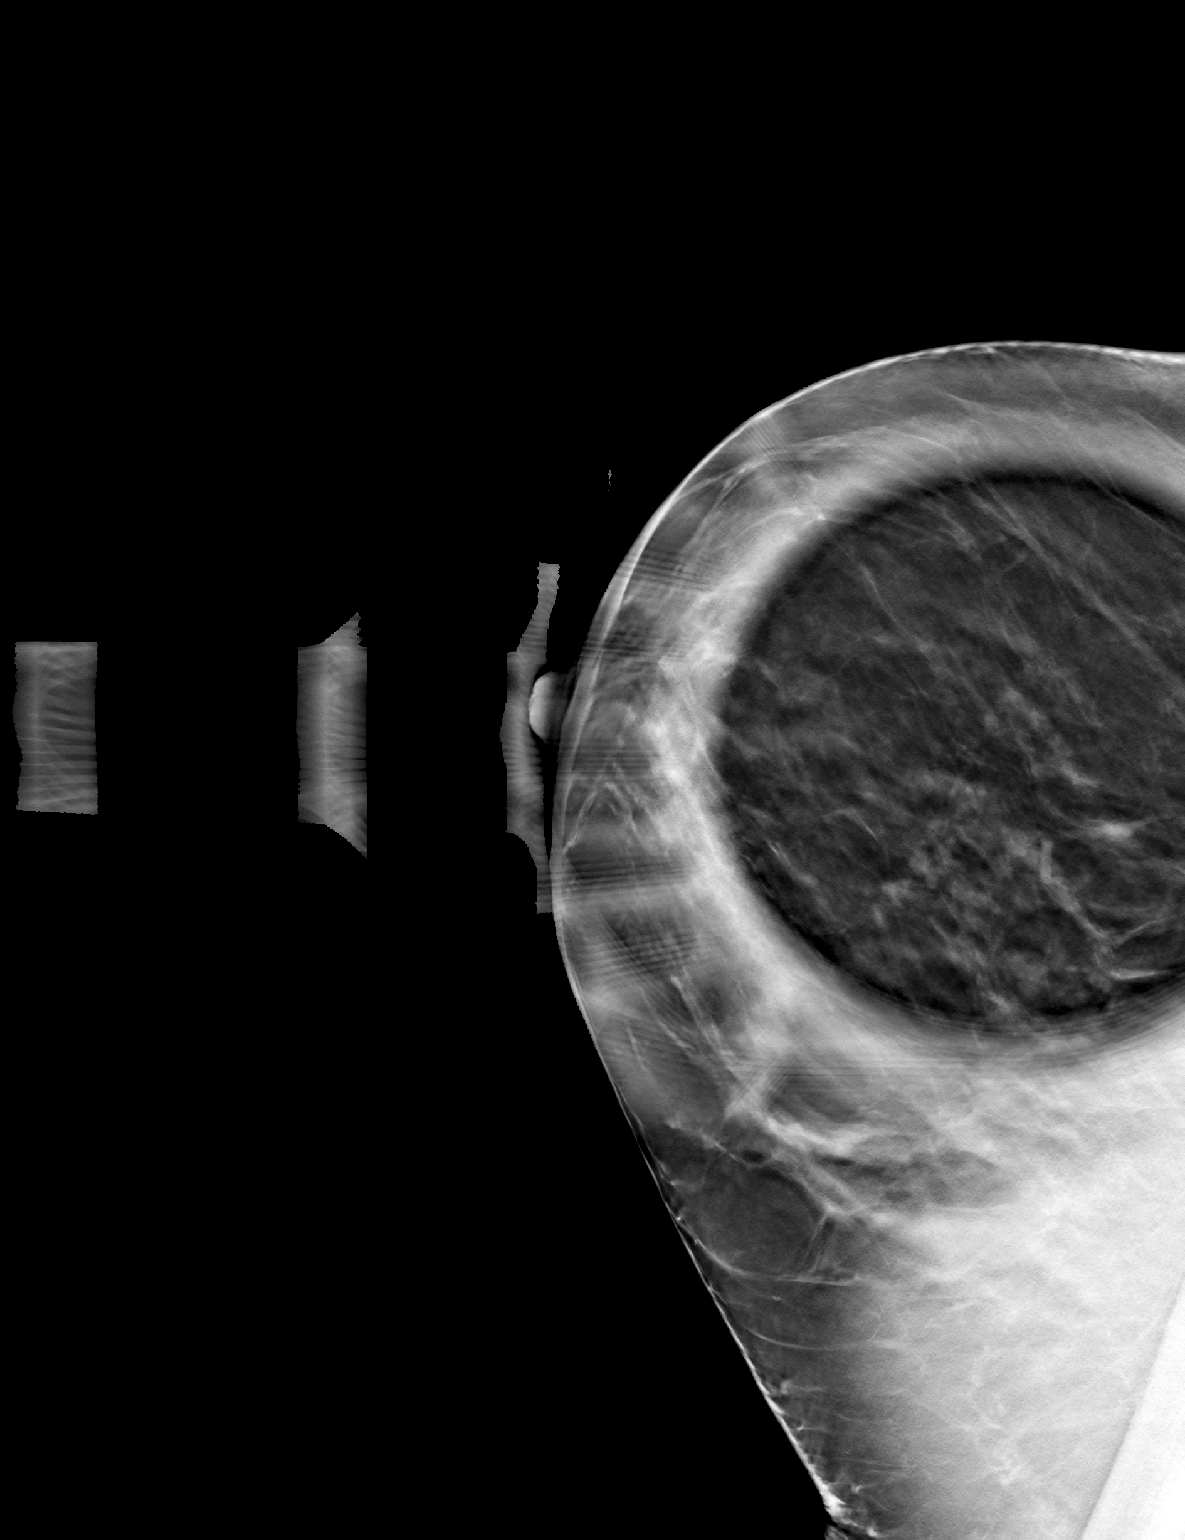

[3 of 7 positions shown; findings below may reference images not displayed]

ACR Breast Density Category c: The breast tissue is heterogeneously
dense, which may obscure small masses.
FINDINGS: Spot compression view of the left breast in the MLO projection with
tomography shows no persistent asymmetry. 90 degree lateral view of
the left breast is negative.

Mammographic images were processed with CAD.
IMPRESSION: No evidence of malignancy in the left breast.

RECOMMENDATION:
Screening mammogram in one year.(Code:[id number])

I have discussed the findings and recommendations with the patient.
Results were also provided in writing at the conclusion of the
visit. If applicable, a reminder letter will be sent to the patient
regarding the next appointment.

BI-RADS CATEGORY  1: Negative.

## 2019-11-08 ENCOUNTER — Other Ambulatory Visit: Payer: Self-pay | Admitting: Internal Medicine

## 2019-11-15 DIAGNOSIS — H40003 Preglaucoma, unspecified, bilateral: Secondary | ICD-10-CM | POA: Diagnosis not present

## 2019-11-22 DIAGNOSIS — H40003 Preglaucoma, unspecified, bilateral: Secondary | ICD-10-CM | POA: Diagnosis not present

## 2019-11-23 DIAGNOSIS — R69 Illness, unspecified: Secondary | ICD-10-CM | POA: Diagnosis not present

## 2019-12-30 ENCOUNTER — Other Ambulatory Visit: Payer: Self-pay | Admitting: Internal Medicine

## 2020-01-17 DIAGNOSIS — R69 Illness, unspecified: Secondary | ICD-10-CM | POA: Diagnosis not present

## 2020-01-20 DIAGNOSIS — D2262 Melanocytic nevi of left upper limb, including shoulder: Secondary | ICD-10-CM | POA: Diagnosis not present

## 2020-01-20 DIAGNOSIS — D2271 Melanocytic nevi of right lower limb, including hip: Secondary | ICD-10-CM | POA: Diagnosis not present

## 2020-01-20 DIAGNOSIS — D225 Melanocytic nevi of trunk: Secondary | ICD-10-CM | POA: Diagnosis not present

## 2020-01-20 DIAGNOSIS — Z85828 Personal history of other malignant neoplasm of skin: Secondary | ICD-10-CM | POA: Diagnosis not present

## 2020-01-20 DIAGNOSIS — D2272 Melanocytic nevi of left lower limb, including hip: Secondary | ICD-10-CM | POA: Diagnosis not present

## 2020-01-20 DIAGNOSIS — L821 Other seborrheic keratosis: Secondary | ICD-10-CM | POA: Diagnosis not present

## 2020-01-20 DIAGNOSIS — D2261 Melanocytic nevi of right upper limb, including shoulder: Secondary | ICD-10-CM | POA: Diagnosis not present

## 2020-01-31 ENCOUNTER — Other Ambulatory Visit: Payer: Self-pay | Admitting: Internal Medicine

## 2020-02-09 ENCOUNTER — Other Ambulatory Visit: Payer: Self-pay | Admitting: Internal Medicine

## 2020-02-09 DIAGNOSIS — I10 Essential (primary) hypertension: Secondary | ICD-10-CM

## 2020-02-18 ENCOUNTER — Ambulatory Visit (INDEPENDENT_AMBULATORY_CARE_PROVIDER_SITE_OTHER): Payer: Medicare HMO

## 2020-02-18 ENCOUNTER — Telehealth: Payer: Self-pay

## 2020-02-18 VITALS — Ht 63.0 in | Wt 136.0 lb

## 2020-02-18 DIAGNOSIS — Z0000 Encounter for general adult medical examination without abnormal findings: Secondary | ICD-10-CM

## 2020-02-18 NOTE — Patient Instructions (Addendum)
°  Ms. Filsinger , Thank you for taking time to come for your Medicare Wellness Visit. I appreciate your ongoing commitment to your health goals. Please review the following plan we discussed and let me know if I can assist you in the future.   These are the goals we discussed: Goals      Patient Stated     Follow up with Primary Care Provider (pt-stated)      Spot check and log blood pressure daily, keep log and notify pcp of changes      Weight (lb) < 134 lb (60.8 kg) (pt-stated)      I'd love to lose about 5lbs       This is a list of the screening recommended for you and due dates:  Health Maintenance  Topic Date Due   COVID-19 Vaccine (1) 03/05/2020*   Flu Shot  04/09/2020   Tetanus Vaccine  02/26/2023   DEXA scan (bone density measurement)  Completed    Hepatitis C: One time screening is recommended by Center for Disease Control  (CDC) for  adults born from [birth year] through 1966.   Completed   Pneumonia vaccines  Completed  *Topic was postponed. The date shown is not the original due date.

## 2020-02-18 NOTE — Progress Notes (Addendum)
Subjective:   Wendy Love is a 79 y.o. female who presents for Medicare Annual (Subsequent) preventive examination.  Review of Systems:  No ROS.  Medicare Wellness Virtual Visit.   Cardiac Risk Factors include: advanced age (>80men, >75 women);hypertension     Objective:     Vitals: Ht 5\' 3"  (1.6 m)   Wt 136 lb (61.7 kg)   BMI 24.09 kg/m   Body mass index is 24.09 kg/m.  Advanced Directives 02/18/2020 02/17/2019 04/08/2018 04/08/2017  Does Patient Have a Medical Advance Directive? Yes Yes Yes Yes  Type of Paramedic of [place];Living will [place];Living will [place];Living will [place];Living will  Does patient want to make changes to medical advance directive? No - Patient declined No - Patient declined No - Patient declined -  Copy of [place] in Chart? No - copy requested No - copy requested No - copy requested -    Tobacco Social History   Tobacco Use  Smoking Status Never Smoker  Smokeless Tobacco Never Used  Tobacco Comment   smoked socially in college     Counseling given: Not Answered Comment: smoked socially in college   Clinical Intake:  Pre-visit preparation completed: Yes        Diabetes: No  How often do you need to have someone help you when you read instructions, pamphlets, or other written materials from your doctor or pharmacy?: 1 - Never  Interpreter Needed?: No     Past Medical History:  Diagnosis Date  . Anxiety   . Bronchitis   . Cancer (HCC)    squamous cell  . Degenerative disc disease, lumbar   . Depression   . Endometriosis 1974   s/p abdominal surgery, 2nd surgery  . High cholesterol   . Hypertension   . Hypothyroidism   . Rhinosinusitis   . Sciatica    Past Surgical History:  Procedure Laterality Date  . ABDOMINAL HYSTERECTOMY  2000  . APPENDECTOMY    . CARPAL TUNNEL RELEASE     bilateral  .  Endoscopy/colonoscopy  April 2012   Schatski's Ring , small hiatal hernia  . hemilaminectomy  April 2010   L3-L4hemi, L4-L5 microdiskectomy  . KNEE ARTHROSCOPY WITH LATERAL MENISECTOMY Right 04/08/2018   Procedure: KNEE ARTHROSCOPY WITH PARTIAL LATERAL MENISECTOMY and debridement.;  Surgeon: Corky Mull, MD;  Location: [facility];  Service: Orthopedics;  Laterality: Right;   Family History  Problem Relation Age of Onset  . Ovarian cancer Mother   . Cancer Mother 21       ovarian cancer, treated by [facility]  . Heart disease Father   . Breast cancer Neg Hx    Social History   Socioeconomic History  . Marital status: Widowed    Spouse name: Not on file  . Number of children: Not on file  . Years of education: Not on file  . Highest education level: Not on file  Occupational History  . Not on file  Tobacco Use  . Smoking status: Never Smoker  . Smokeless tobacco: Never Used  . Tobacco comment: smoked socially in college  Vaping Use  . Vaping Use: Never used  Substance and Sexual Activity  . Alcohol use: No  . Drug use: No  . Sexual activity: Not Currently  Other Topics Concern  . Not on file  Social History Narrative  . Not on file   Social Determinants of Health  Financial Resource Strain:   . Difficulty of Paying Living Expenses:   Food Insecurity:   . Worried About Charity fundraiser in the Last Year:   . Arboriculturist in the Last Year:   Transportation Needs:   . Film/video editor (Medical):   [name] Lack of Transportation (Non-Medical):   Physical Activity:   . Days of Exercise per Week:   . Minutes of Exercise per Session:   Stress:   . Feeling of Stress :   Social Connections:   . Frequency of Communication with Friends and Family:   . Frequency of Social Gatherings with Friends and Family:   . Attends Religious Services:   . Active Member of Clubs or Organizations:   . Attends Archivist Meetings:   [name] Marital Status:      Outpatient Encounter Medications as of 02/18/2020  Medication Sig  . amLODipine (NORVASC) 5 MG tablet TAKE 1 TABLET BY MOUTH EVERY DAY  . b complex vitamins tablet Take 1 tablet by mouth daily.    [name] CALCIUM-MAGNESIUM PO Take by mouth.  . chlorhexidine (PERIDEX) 0.12 % solution SMARTSIG:15 Milliliter(s) By Mouth Morning-Night  . Cholecalciferol (VITAMIN D3) 2000 UNITS TABS Take 5,000 Units by mouth daily.   [name] levothyroxine (SYNTHROID) 75 MCG tablet TAKE 1 TABLET BY MOUTH EVERY DAY  . losartan (COZAAR) 50 MG tablet TAKE 2 TABLETS BY MOUTH EVERY DAY  . Magnesium 300 MG CAPS Take by mouth daily.  . Multiple Vitamin (MULTIVITAMIN) tablet Take 1 tablet by mouth daily.  . Omega-3 Fatty Acids (FISH OIL PO) Take by mouth daily.  [name] PROBIOTIC CAPS Take by mouth daily.    . temazepam (RESTORIL) 15 MG capsule TAKE 1 CAPSULE BY MOUTH AT BEDTIME  . tretinoin (RETIN-A) 0.025 % cream APPLY TO WHOLE FACE EACH NIGHT.  . Triamcinolone Acetonide (NASACORT ALLERGY 24HR NA) Place 1 spray into the nose daily as needed.   . Turmeric 500 MG CAPS Take 500 mg by mouth 2 (two) times daily.  . vitamin C (ASCORBIC ACID) 500 MG tablet Take 1,000 mg by mouth daily.   . Zinc 25 MG TABS Take 25 mg by mouth daily.   No facility-administered encounter medications on file as of 02/18/2020.    Activities of Daily Living In your present state of health, do you have any difficulty performing the following activities: 02/18/2020  Hearing? N  Vision? N  Difficulty concentrating or making decisions? N  Walking or climbing stairs? N  Dressing or bathing? N  Doing errands, shopping? N  Preparing Food and eating ? N  Using the Toilet? N  In the past six months, have you accidently leaked urine? N  Do you have problems with loss of bowel control? N  Managing your Medications? N  Managing your Finances? N  Housekeeping or managing your Housekeeping? N  Some recent data might be hidden    Patient Care Team: Crecencio Mc, MD as PCP - General (Internal Medicine)    Assessment:   This is a routine wellness examination for Wendy Love.   I connected with Wendy Love today by telephone and verified that I am speaking with the correct person using two identifiers. Location patient: home Location provider: work Persons participating in the virtual visit: patient, Marine scientist.    I discussed the limitations, risks, security and privacy concerns of performing an evaluation and management service by telephone and the availability of in person appointments. The patient expressed understanding and verbally consented  to this telephonic visit.    Interactive audio and video telecommunications were attempted between this provider and patient, however failed, due to patient having technical difficulties OR patient did not have access to video capability.  We continued and completed visit with audio only.  Some vital signs may be absent or patient reported.   Health Maintenance Due: See completed HM at the end of note.   Eye: Visual acuity not assessed. Virtual visit. Followed by their ophthalmologist.  Dental: Visits every 6 months.    Hearing: Demonstrates normal hearing during visit.  Safety:  Patient feels safe at home- yes Patient does have smoke detectors at home- yes Patient does wear sunscreen or protective clothing when in direct sunlight - yes Patient does wear seat belt when in a moving vehicle - yes Patient drives- yes Adequate lighting in walkways free from debris- yes Grab bars and handrails used as appropriate- yes Ambulates with an assistive device- no Cell phone on person- yes  Social: Alcohol intake - no  Smoking history- never  Smokers in home? none Illicit drug use? none  Medication: Taking as directed and without issues.  Self managed - yes   Covid-19: Precautions and sickness symptoms discussed. Wears mask, social distancing, hand hygiene as appropriate.   Activities of Daily  Living Patient denies needing assistance with: household chores, feeding themselves, getting from bed to chair, getting to the toilet, bathing/showering, dressing, managing money, or preparing meals.   Discussed the importance of a healthy diet, water intake and the benefits of aerobic exercise.   Physical activity- walking several days weekly 25 min, exercise class at [facility] 1-2x weekly 45 min, balancing exercises  Diet:  Modified carb Water: good intake Caffeine: chocolate, 1-2 cup of coffee  Other Providers Patient Care Team: Crecencio Mc, MD as PCP - General (Internal Medicine) Exercise Activities and Dietary recommendations Current Exercise Habits: Home exercise routine, Time (Minutes): 25, Frequency (Times/Week): 4, Weekly Exercise (Minutes/Week): 100, Intensity: Mild  Goals      Patient Stated   .  Follow up with Primary Care Provider (pt-stated)      Spot check and log blood pressure daily, keep log and notify pcp of changes    .  Weight (lb) < 134 lb (60.8 kg) (pt-stated)      I'd love to lose about 5lbs       Fall Risk Fall Risk  02/18/2020 09/20/2019 09/06/2019 02/17/2019 12/30/2017  Falls in the past year? 0 0 0 0 No  Number falls in past yr: 0 - - - -  Injury with Fall? - - - - -  Risk for fall due to : - - - - -  Follow up Falls evaluation completed Falls evaluation completed Falls evaluation completed - -   Is the patient's home free of loose throw rugs in walkways, pet beds, electrical cords, etc?  Yes      Handrails on the stairs?  Yes      Adequate lighting?  Yes  Timed Get Up and Go performed: No, virtual visit  Depression Screen PHQ 2/9 Scores 02/18/2020 02/17/2019 12/30/2017 12/05/2015  PHQ - 2 Score 0 0 0 0  PHQ- 9 Score - - 1 -     Cognitive Function   Patient is alert and oriented x3. Patient denies difficulty focusing or concentrating. Patient likes to play brain challenging games brain health.     6CIT Screen 02/17/2019  What Year? 0 points   What month? 0 points  What  time? 0 points  Count back from 20 0 points  Months in reverse 0 points  Repeat phrase 0 points  Total Score 0    Immunization History  Administered Date(s) Administered  . Influenza Split 06/10/2012, 06/09/2013  . Influenza Whole 06/06/2009  . Pneumococcal Conjugate-13 07/04/2015  . Pneumococcal Polysaccharide-23 04/24/2009  . Td 06/06/2009, 02/25/2013  . Zoster 05/28/2010   Screening Tests Health Maintenance  Topic Date Due  . COVID-19 Vaccine (1) 03/05/2020 (Originally 05/24/1953)  . INFLUENZA VACCINE  04/09/2020  . TETANUS/TDAP  02/26/2023  . DEXA SCAN  Completed  . Hepatitis C Screening  Completed  . PNA vac Low Risk Adult  Completed    Cancer Screenings: Lung: Low Dose CT Chest recommended if Age 75-80 years, 30 pack-year currently smoking OR have quit w/in 15years. Patient does not qualify.    Plan:   Keep all routine maintenance appointments.   Medicare Attestation I have personally reviewed: The patient's medical and social history Their use of alcohol, tobacco or illicit drugs Their current medications and supplements The patient's functional ability including ADLs,fall risks, home safety risks, cognitive, and hearing and visual impairment Diet and physical activities Evidence for depression   I have reviewed and discussed with patient certain preventive protocols, quality metrics, and best practice recommendations.      OBrien-Blaney, Lesbia Ottaway L, LPN  [id number]/37/[id number]     I have reviewed the above information and agree with above.   Deborra Medina, MD

## 2020-02-18 NOTE — Telephone Encounter (Signed)
Unable to reach patient for scheduled awv. No answer. Left message to call the office back during allotted timeframe or reschedule as appropriate.

## 2020-03-27 ENCOUNTER — Other Ambulatory Visit: Payer: Self-pay | Admitting: Internal Medicine

## 2020-03-27 NOTE — Telephone Encounter (Signed)
Spoke with pt and scheduled her for a follow up with Dr. Derrel Nip. Pt is aware of appt date and time.

## 2020-03-27 NOTE — Telephone Encounter (Signed)
Refill request for restoril, last seen 09-20-19, last filled 09-27-19.  Please advise.

## 2020-03-27 NOTE — Telephone Encounter (Signed)
Please notify patient that the prescription  was Refilled for 30 days only because it has been  6 months since last visit. [name]  OFFICE VISIT NEEDED prior to any more refills

## 2020-04-11 ENCOUNTER — Other Ambulatory Visit: Payer: Self-pay

## 2020-04-11 ENCOUNTER — Ambulatory Visit (INDEPENDENT_AMBULATORY_CARE_PROVIDER_SITE_OTHER): Payer: Medicare HMO | Admitting: Internal Medicine

## 2020-04-11 ENCOUNTER — Encounter: Payer: Self-pay | Admitting: Internal Medicine

## 2020-04-11 VITALS — HR 76 | Temp 98.7°F | Resp 15 | Ht 63.0 in | Wt 136.6 lb

## 2020-04-11 DIAGNOSIS — Z20822 Contact with and (suspected) exposure to covid-19: Secondary | ICD-10-CM

## 2020-04-11 DIAGNOSIS — I10 Essential (primary) hypertension: Secondary | ICD-10-CM | POA: Diagnosis not present

## 2020-04-11 LAB — COMPREHENSIVE METABOLIC PANEL
ALT: 18 U/L (ref 0–35)
AST: 23 U/L (ref 0–37)
Albumin: 4.5 g/dL (ref 3.5–5.2)
Alkaline Phosphatase: 66 U/L (ref 39–117)
BUN: 18 mg/dL (ref 6–23)
CO2: 30 mEq/L (ref 19–32)
Calcium: 9.8 mg/dL (ref 8.4–10.5)
Chloride: 100 mEq/L (ref 96–112)
Creatinine, Ser: 0.62 mg/dL (ref 0.40–1.20)
GFR: 92.65 mL/min (ref >60.00)
Glucose, Bld: 97 mg/dL (ref 70–99)
Potassium: 4.4 mEq/L (ref 3.5–5.1)
Sodium: 135 mEq/L (ref 135–145)
Total Bilirubin: 0.4 mg/dL (ref 0.2–1.2)
Total Protein: 6.9 g/dL (ref 6.0–8.3)

## 2020-04-11 LAB — MICROALBUMIN / CREATININE URINE RATIO
Creatinine,U: 14.4 mg/dL
Microalb Creat Ratio: 4.9 mg/g (ref 0.0–30.0)
Microalb, Ur: <0.7 mg/dL (ref 0.0–1.9)

## 2020-04-11 LAB — SARS-COV-2 IGG: SARS-COV-2 IgG: 0.02

## 2020-04-11 NOTE — Patient Instructions (Signed)
Good to see you!  I'll have the results of your labs in 2 days.  If the urine test is positive for protein,  We'll be more aggressive about your blood pressure  Return for RN visit with blood pressure machine

## 2020-04-11 NOTE — Progress Notes (Signed)
Subjective:  Patient ID: Wendy Love, female    DOB: July 20, 1941  Age: 79 y.o. MRN: [id number]  CC: The primary encounter diagnosis was Essential hypertension. Diagnoses of Exposure to COVID-19 virus, Autosomal dominant hereditary hemochromatosis ([place]), and White coat syndrome with hypertension were also pertinent to this visit.  HPI Wendy Love presents for 6 month follow up   This visit occurred during the SARS-CoV-2 public health emergency.  Safety protocols were in place, including screening questions prior to the visit, additional usage of staff PPE, and extensive cleaning of exam room while observing appropriate contact time as indicated for disinfecting solutions.    Patient has received NO  doses of the available COVID 19 vaccines due to concern about the effect of non FDA approved mRNA vaccines .   Patient continues to mask when outside of the home except when walking in yard or at safe distances from others .  Patient denies any change in mood or development of unhealthy behaviors resuting from the pandemic's restriction of activities and socialization.   Had lifeline screening  Tests recently,  All normal  .  Hypertension: patient has not been checking blood pressure recently, not I n the last 6 months . Patient is following a reduce salt diet most days and is taking medications as prescribed Outpatient Medications Prior to Visit  Medication Sig Dispense Refill   amLODipine (NORVASC) 5 MG tablet TAKE 1 TABLET BY MOUTH EVERY DAY 90 tablet 1   b complex vitamins tablet Take 1 tablet by mouth daily.       CALCIUM-MAGNESIUM PO Take by mouth.     chlorhexidine (PERIDEX) 0.12 % solution SMARTSIG:15 Milliliter(s) By Mouth Morning-Night     Cholecalciferol (VITAMIN D3) 2000 UNITS TABS Take 5,000 Units by mouth daily.      levothyroxine (SYNTHROID) 75 MCG tablet TAKE 1 TABLET BY MOUTH EVERY DAY 90 tablet 0   losartan (COZAAR) 50 MG tablet TAKE 2 TABLETS BY MOUTH  EVERY DAY 180 tablet 1   Magnesium 300 MG CAPS Take by mouth daily.     Multiple Vitamin (MULTIVITAMIN) tablet Take 1 tablet by mouth daily.     Omega-3 Fatty Acids (FISH OIL PO) Take by mouth daily.     PROBIOTIC CAPS Take by mouth daily.       temazepam (RESTORIL) 15 MG capsule TAKE 1 CAPSULE BY MOUTH AT BEDTIME 30 capsule 0   tretinoin (RETIN-A) 0.025 % cream APPLY TO WHOLE FACE EACH NIGHT.  5   Triamcinolone Acetonide (NASACORT ALLERGY 24HR NA) Place 1 spray into the nose daily as needed.      Turmeric 500 MG CAPS Take 500 mg by mouth 2 (two) times daily.     vitamin C (ASCORBIC ACID) 500 MG tablet Take 1,000 mg by mouth daily.      Zinc 25 MG TABS Take 25 mg by mouth daily.     No facility-administered medications prior to visit.    Review of Systems;  Patient denies headache, fevers, malaise, unintentional weight loss, skin rash, eye pain, sinus congestion and sinus pain, sore throat, dysphagia,  hemoptysis , cough, dyspnea, wheezing, chest pain, palpitations, orthopnea, edema, abdominal pain, nausea, melena, diarrhea, constipation, flank pain, dysuria, hematuria, urinary  Frequency, nocturia, numbness, tingling, seizures,  Focal weakness, Loss of consciousness,  Tremor, insomnia, depression, anxiety, and suicidal ideation.      Objective:  Pulse 76    Temp 98.7 F (37.1 C) (Oral)    Resp 15  Ht 5\' 3"  (1.6 m)    Wt 136 lb 9.6 oz (62 kg)    SpO2 97%    BMI 24.20 kg/m   BP Readings from Last 3 Encounters:  09/20/19 140/74  02/15/19 (!) 170/82  09/29/18 (!) 195/90    Wt Readings from Last 3 Encounters:  04/11/20 136 lb 9.6 oz (62 kg)  02/18/20 136 lb (61.7 kg)  09/20/19 136 lb 6.4 oz (61.9 kg)    General appearance: alert, cooperative and appears stated age Ears: normal TM's and external ear canals both ears Throat: lips, mucosa, and tongue normal; teeth and gums normal Neck: no adenopathy, no carotid bruit, supple, symmetrical, trachea midline and thyroid not  enlarged, symmetric, no tenderness/mass/nodules Back: symmetric, no curvature. ROM normal. No CVA tenderness. Lungs: clear to auscultation bilaterally Heart: regular rate and rhythm, S1, S2 normal, no murmur, click, rub or gallop Abdomen: soft, non-tender; bowel sounds normal; no masses,  no organomegaly Pulses: 2+ and symmetric Skin: Skin color, texture, turgor normal. No rashes or lesions Lymph nodes: Cervical, supraclavicular, and axillary nodes normal.  Lab Results  Component Value Date   HGBA1C 5.2 09/16/2019   HGBA1C 5.4 02/25/2019   HGBA1C 5.1 08/12/2018    Lab Results  Component Value Date   CREATININE 0.62 04/11/2020   CREATININE 0.60 09/16/2019   CREATININE 0.63 03/04/2019    Lab Results  Component Value Date   WBC 5.1 09/23/2018   HGB 14.5 09/23/2018   HCT 40.5 09/23/2018   PLT 224 09/23/2018   GLUCOSE 97 04/11/2020   CHOL 221 (H) 09/16/2019   TRIG 78.0 09/16/2019   HDL 85.80 09/16/2019   LDLDIRECT 115.0 08/12/2018   LDLCALC 120 (H) 09/16/2019   ALT 18 04/11/2020   AST 23 04/11/2020   NA 135 04/11/2020   K 4.4 04/11/2020   CL 100 04/11/2020   CREATININE 0.62 04/11/2020   BUN 18 04/11/2020   CO2 30 04/11/2020   TSH 2.18 09/16/2019   HGBA1C 5.2 09/16/2019   MICROALBUR <0.7 04/11/2020    MM DIAG BREAST TOMO UNI LEFT  Result Date: 02/25/2019 CLINICAL DATA:  79 year old patient recalled from recent screening mammogram for evaluation possible left breast asymmetry seen in the MLO projection only. EXAM: DIGITAL DIAGNOSTIC UNILATERAL LEFT MAMMOGRAM WITH CAD AND TOMO COMPARISON:  February 18, 2019 ACR Breast Density Category c: The breast tissue is heterogeneously dense, which may obscure small masses. FINDINGS: Spot compression view of the left breast in the MLO projection with tomography shows no persistent asymmetry. 90 degree lateral view of the left breast is negative. Mammographic images were processed with CAD. IMPRESSION: No evidence of malignancy in the left  breast. RECOMMENDATION: Screening mammogram in one year.(Code:SM-B-01Y) I have discussed the findings and recommendations with the patient. Results were also provided in writing at the conclusion of the visit. If applicable, a reminder letter will be sent to the patient regarding the next appointment. BI-RADS CATEGORY  1: Negative. Electronically Signed   By: Curlene Dolphin M.D.   On: 02/25/2019 14:04    Assessment & Plan:   Problem List Items Addressed This Visit      Unprioritized   Autosomal dominant hereditary hemochromatosis ([place])    She deferred hematology referral in April  2019  Repeat iron and CBC needed   Last sat was 51%   Lab Results  Component Value Date   IRON 171 (H) 09/23/2018   TIBC 271 09/23/2018   FERRITIN 52 09/23/2018   Lab Results  Component Value  Date   WBC 5.1 09/23/2018   HGB 14.5 09/23/2018   HCT 40.5 09/23/2018   MCV 93.3 09/23/2018   PLT 224 09/23/2018         Relevant Orders   IBC + Ferritin   CBC with Differential/Platelet   White coat syndrome with hypertension - Primary    Home readings are much lower than in house readings .  She has no proteinuria on today's exam .  I have asked her to  return with home machine to ensure its accuracy and have her follow up with Dr . Lucky Cowboy if elevated.   Lab Results  Component Value Date   CREATININE 0.62 04/11/2020   Lab Results  Component Value Date   NA 135 04/11/2020   K 4.4 04/11/2020   CL 100 04/11/2020   CO2 30 04/11/2020   Lab Results  Component Value Date   MICROALBUR <0.7 04/11/2020   MICROALBUR 0.2 11/26/2013            Other Visit Diagnoses    Exposure to COVID-19 virus       Relevant Orders   SARS-COV-2 IgG (Completed)      I am having Clela W. Geralyn Flash "Polly" maintain her b complex vitamins, Vitamin D3, Probiotic, multivitamin, Triamcinolone Acetonide (NASACORT ALLERGY 24HR NA), vitamin C, Magnesium, Omega-3 Fatty Acids (FISH OIL PO), tretinoin, CALCIUM-MAGNESIUM PO,  Turmeric, Zinc, chlorhexidine, losartan, levothyroxine, amLODipine, and temazepam.  No orders of the defined types were placed in this encounter.   There are no discontinued medications.  Follow-up: Return in about 6 months (around 10/12/2020).   Crecencio Mc, MD

## 2020-04-12 ENCOUNTER — Other Ambulatory Visit: Payer: Self-pay | Admitting: Internal Medicine

## 2020-04-12 DIAGNOSIS — Z1231 Encounter for screening mammogram for malignant neoplasm of breast: Secondary | ICD-10-CM

## 2020-04-12 NOTE — Assessment & Plan Note (Addendum)
She deferred hematology referral in April  2019  Repeat iron and CBC needed   Last sat was 51%   Lab Results  Component Value Date   IRON 171 (H) 09/23/2018   TIBC 271 09/23/2018   FERRITIN 52 09/23/2018   Lab Results  Component Value Date   WBC 5.1 09/23/2018   HGB 14.5 09/23/2018   HCT 40.5 09/23/2018   MCV 93.3 09/23/2018   PLT 224 09/23/2018

## 2020-04-12 NOTE — Assessment & Plan Note (Addendum)
Home readings are much lower than in house readings .  She has no proteinuria on today's exam .  I have asked her to  return with home machine to ensure its accuracy and have her follow up with Dr . Lucky Cowboy if elevated.   Lab Results  Component Value Date   CREATININE 0.62 04/11/2020   Lab Results  Component Value Date   NA 135 04/11/2020   K 4.4 04/11/2020   CL 100 04/11/2020   CO2 30 04/11/2020   Lab Results  Component Value Date   MICROALBUR <0.7 04/11/2020   MICROALBUR 0.2 11/26/2013

## 2020-04-24 ENCOUNTER — Ambulatory Visit
Admission: RE | Admit: 2020-04-24 | Discharge: 2020-04-24 | Disposition: A | Discharge status: Home / Self Care | Payer: Medicare HMO | Source: Ambulatory Visit | Attending: Internal Medicine | Admitting: Internal Medicine

## 2020-04-24 ENCOUNTER — Other Ambulatory Visit: Payer: Self-pay

## 2020-04-24 DIAGNOSIS — Z1231 Encounter for screening mammogram for malignant neoplasm of breast: Secondary | ICD-10-CM | POA: Diagnosis not present

## 2020-04-25 ENCOUNTER — Ambulatory Visit: Payer: Medicare HMO

## 2020-05-01 ENCOUNTER — Other Ambulatory Visit: Payer: Self-pay | Admitting: Internal Medicine

## 2020-05-03 ENCOUNTER — Ambulatory Visit (INDEPENDENT_AMBULATORY_CARE_PROVIDER_SITE_OTHER): Payer: Medicare HMO

## 2020-05-03 ENCOUNTER — Other Ambulatory Visit: Payer: Self-pay

## 2020-05-03 VITALS — BP 154/83 | HR 78

## 2020-05-03 DIAGNOSIS — I10 Essential (primary) hypertension: Secondary | ICD-10-CM | POA: Diagnosis not present

## 2020-05-03 NOTE — Progress Notes (Addendum)
Patient is here for a BP monitor check due to bp being high at last visit and at home BP has been good, as per patient.  Currently patients BP is 156/80 and BPM is 80. Patient BP monitor was very accurate, BP 154/83 BPM was 78. Patient also has no complaints of headaches, blurry vision, chest pain, arm pain, light headedness, dizziness, and nor jaw pain. Please see previous note for order.     she reports compliance with medication regimen  and home readings on a verified machine are lower,  No changes today  Regards,   Deborra Medina, MD

## 2020-05-30 DIAGNOSIS — H43812 Vitreous degeneration, left eye: Secondary | ICD-10-CM | POA: Diagnosis not present

## 2020-05-30 DIAGNOSIS — H2513 Age-related nuclear cataract, bilateral: Secondary | ICD-10-CM | POA: Diagnosis not present

## 2020-06-28 ENCOUNTER — Other Ambulatory Visit: Payer: Self-pay

## 2020-06-28 ENCOUNTER — Other Ambulatory Visit: Payer: Self-pay | Admitting: Internal Medicine

## 2020-06-28 MED ORDER — TEMAZEPAM 15 MG PO CAPS
15.0000 mg | ORAL_CAPSULE | Freq: Every day | ORAL | 2 refills | Status: DC
Start: 2020-06-28 — End: 2020-06-28

## 2020-06-28 MED ORDER — TEMAZEPAM 15 MG PO CAPS
15.0000 mg | ORAL_CAPSULE | Freq: Every day | ORAL | 2 refills | Status: DC
Start: 2020-06-28 — End: 2020-10-11

## 2020-06-28 NOTE — Addendum Note (Signed)
Addended by: Crecencio Mc on: 06/28/2020 08:26 PM   Modules accepted: Orders

## 2020-06-28 NOTE — Telephone Encounter (Signed)
Refill request for restoril, last seen 04-11-20. REPEAT due to accidental printing. Please advise.

## 2020-06-28 NOTE — Telephone Encounter (Signed)
Refill sent.  But 90 days not permissible bc it is a controlled substance

## 2020-07-02 ENCOUNTER — Other Ambulatory Visit: Payer: Self-pay | Admitting: Internal Medicine

## 2020-07-12 DIAGNOSIS — H2512 Age-related nuclear cataract, left eye: Secondary | ICD-10-CM | POA: Diagnosis not present

## 2020-07-19 ENCOUNTER — Other Ambulatory Visit: Payer: Self-pay

## 2020-07-19 ENCOUNTER — Encounter: Payer: Self-pay | Admitting: Ophthalmology

## 2020-07-24 ENCOUNTER — Other Ambulatory Visit
Admission: RE | Admit: 2020-07-24 | Discharge: 2020-07-24 | Disposition: A | Discharge status: Home / Self Care | Payer: Medicare HMO | Source: Ambulatory Visit | Attending: Ophthalmology | Admitting: Ophthalmology

## 2020-07-24 ENCOUNTER — Other Ambulatory Visit: Payer: Self-pay

## 2020-07-24 DIAGNOSIS — Z01812 Encounter for preprocedural laboratory examination: Secondary | ICD-10-CM | POA: Diagnosis not present

## 2020-07-24 DIAGNOSIS — Z20822 Contact with and (suspected) exposure to covid-19: Secondary | ICD-10-CM | POA: Insufficient documentation

## 2020-07-24 NOTE — Discharge Instructions (Signed)

## 2020-07-25 ENCOUNTER — Other Ambulatory Visit: Payer: Self-pay | Admitting: Internal Medicine

## 2020-07-25 LAB — SARS CORONAVIRUS 2 (TAT 6-24 HRS): SARS Coronavirus 2: NEGATIVE

## 2020-07-26 ENCOUNTER — Other Ambulatory Visit: Payer: Self-pay

## 2020-07-26 ENCOUNTER — Encounter: Payer: Self-pay | Admitting: Ophthalmology

## 2020-07-26 ENCOUNTER — Ambulatory Visit: Payer: Medicare HMO | Admitting: Anesthesiology

## 2020-07-26 ENCOUNTER — Encounter
Admission: RE | Disposition: A | Discharge status: Home / Self Care | Payer: Self-pay | Source: Ambulatory Visit | Attending: Ophthalmology

## 2020-07-26 ENCOUNTER — Ambulatory Visit
Admission: RE | Admit: 2020-07-26 | Discharge: 2020-07-26 | Disposition: A | Discharge status: Home / Self Care | Payer: Medicare HMO | Source: Ambulatory Visit | Attending: Ophthalmology | Admitting: Ophthalmology

## 2020-07-26 DIAGNOSIS — Z79899 Other long term (current) drug therapy: Secondary | ICD-10-CM | POA: Diagnosis not present

## 2020-07-26 DIAGNOSIS — Z85828 Personal history of other malignant neoplasm of skin: Secondary | ICD-10-CM | POA: Diagnosis not present

## 2020-07-26 DIAGNOSIS — H25812 Combined forms of age-related cataract, left eye: Secondary | ICD-10-CM | POA: Diagnosis not present

## 2020-07-26 DIAGNOSIS — H2512 Age-related nuclear cataract, left eye: Secondary | ICD-10-CM | POA: Diagnosis not present

## 2020-07-26 DIAGNOSIS — Z79890 Hormone replacement therapy (postmenopausal): Secondary | ICD-10-CM | POA: Diagnosis not present

## 2020-07-26 HISTORY — PX: CATARACT EXTRACTION W/PHACO: SHX586

## 2020-07-26 SURGERY — PHACOEMULSIFICATION, CATARACT, WITH IOL INSERTION
Anesthesia: Monitor Anesthesia Care | Site: Eye | Laterality: Left

## 2020-07-26 MED ORDER — ARMC OPHTHALMIC DILATING DROPS
1.0000 "application " | OPHTHALMIC | Status: DC | PRN
  Administered 2020-07-26 (×3): 1 via OPHTHALMIC

## 2020-07-26 MED ORDER — CEFUROXIME OPHTHALMIC INJECTION 1 MG/0.1 ML
INJECTION | OPHTHALMIC | Status: DC | PRN
  Administered 2020-07-26: 0.1 mL via INTRACAMERAL

## 2020-07-26 MED ORDER — ACETAMINOPHEN 325 MG PO TABS: 325.0000 mg | ORAL_TABLET | Freq: Once | ORAL | Status: DC

## 2020-07-26 MED ORDER — EPINEPHRINE PF 1 MG/ML IJ SOLN
INTRAOCULAR | Status: DC | PRN
  Administered 2020-07-26: 78 mL via OPHTHALMIC

## 2020-07-26 MED ORDER — LACTATED RINGERS IV SOLN: INTRAVENOUS | Status: DC

## 2020-07-26 MED ORDER — BRIMONIDINE TARTRATE-TIMOLOL 0.2-0.5 % OP SOLN
OPHTHALMIC | Status: DC | PRN
  Administered 2020-07-26: 1 [drp] via OPHTHALMIC

## 2020-07-26 MED ORDER — ACETAMINOPHEN 160 MG/5ML PO SOLN: 325.0000 mg | Freq: Once | ORAL | Status: DC

## 2020-07-26 MED ORDER — MOXIFLOXACIN HCL 0.5 % OP SOLN: 1.0000 [drp] | OPHTHALMIC | Status: DC | PRN

## 2020-07-26 MED ORDER — NA HYALUR & NA CHOND-NA HYALUR 0.4-0.35 ML IO KIT
PACK | INTRAOCULAR | Status: DC | PRN
  Administered 2020-07-26: 1 mL via INTRAOCULAR

## 2020-07-26 MED ORDER — FENTANYL CITRATE (PF) 100 MCG/2ML IJ SOLN
INTRAMUSCULAR | Status: DC | PRN
  Administered 2020-07-26: 50 ug via INTRAVENOUS

## 2020-07-26 MED ORDER — MIDAZOLAM HCL 2 MG/2ML IJ SOLN
INTRAMUSCULAR | Status: DC | PRN
  Administered 2020-07-26: 1 mg via INTRAVENOUS

## 2020-07-26 MED ORDER — TETRACAINE HCL 0.5 % OP SOLN
1.0000 [drp] | OPHTHALMIC | Status: DC | PRN
  Administered 2020-07-26 (×3): 1 [drp] via OPHTHALMIC

## 2020-07-26 MED ORDER — LIDOCAINE HCL (PF) 2 % IJ SOLN
INTRAOCULAR | Status: DC | PRN
  Administered 2020-07-26: 1 mL

## 2020-07-26 SURGICAL SUPPLY — 28 items
CANNULA ANT/CHMB 27G (MISCELLANEOUS) ×1 IMPLANT
CANNULA ANT/CHMB 27GA (MISCELLANEOUS) ×2 IMPLANT
GLOVE SURG LX 7.5 STRW (GLOVE) ×2
GLOVE SURG LX STRL 7.5 STRW (GLOVE) ×1 IMPLANT
GLOVE SURG TRIUMPH 8.0 PF LTX (GLOVE) ×2 IMPLANT
GOWN STRL REUS W/ TWL LRG LVL3 (GOWN DISPOSABLE) ×2 IMPLANT
GOWN STRL REUS W/TWL LRG LVL3 (GOWN DISPOSABLE) ×4
LENS IOL TECNIS EYHANCE 20.5 (Intraocular Lens) ×1 IMPLANT
MARKER SKIN DUAL TIP RULER LAB (MISCELLANEOUS) ×2 IMPLANT
NDL CAPSULORHEX 25GA (NEEDLE) ×1 IMPLANT
NDL FILTER BLUNT 18X1 1/2 (NEEDLE) ×2 IMPLANT
NDL RETROBULBAR .5 NSTRL (NEEDLE) IMPLANT
NEEDLE CAPSULORHEX 25GA (NEEDLE) ×2 IMPLANT
NEEDLE FILTER BLUNT 18X 1/2SAF (NEEDLE) ×2
NEEDLE FILTER BLUNT 18X1 1/2 (NEEDLE) ×2 IMPLANT
PACK CATARACT BRASINGTON (MISCELLANEOUS) ×2 IMPLANT
PACK EYE AFTER SURG (MISCELLANEOUS) ×2 IMPLANT
PACK OPTHALMIC (MISCELLANEOUS) ×2 IMPLANT
RING MALYGIN 7.0 (MISCELLANEOUS) IMPLANT
SOLUTION OPHTHALMIC SALT (MISCELLANEOUS) ×2 IMPLANT
SUT ETHILON 10-0 CS-B-6CS-B-6 (SUTURE)
SUT VICRYL  9 0 (SUTURE)
SUT VICRYL 9 0 (SUTURE) IMPLANT
SUTURE EHLN 10-0 CS-B-6CS-B-6 (SUTURE) IMPLANT
SYR 3ML LL SCALE MARK (SYRINGE) ×4 IMPLANT
SYR TB 1ML LUER SLIP (SYRINGE) ×2 IMPLANT
WATER STERILE IRR 250ML POUR (IV SOLUTION) ×2 IMPLANT
WIPE NON LINTING 3.25X3.25 (MISCELLANEOUS) ×2 IMPLANT

## 2020-07-26 NOTE — Anesthesia Procedure Notes (Signed)
Procedure Name: MAC Date/Time: 07/26/2020 12:58 PM Performed by: Silvana Newness, CRNA Pre-anesthesia Checklist: Patient identified, Emergency Drugs available, Suction available, Patient being monitored and Timeout performed Patient Re-evaluated:Patient Re-evaluated prior to induction Oxygen Delivery Method: Nasal cannula Placement Confirmation: positive ETCO2

## 2020-07-26 NOTE — H&P (Signed)

## 2020-07-26 NOTE — Transfer of Care (Signed)
Immediate Anesthesia Transfer of Care Note  Patient: Wendy Love  Procedure(s) Performed: CATARACT EXTRACTION PHACO AND INTRAOCULAR LENS PLACEMENT (IOC) LEFT 6.30 01:10.2 9.0% (Left Eye)  Patient Location: PACU  Anesthesia Type: MAC  Level of Consciousness: awake, alert  and patient cooperative  Airway and Oxygen Therapy: Patient Spontanous Breathing and Patient connected to supplemental oxygen  Post-op Assessment: Post-op Vital signs reviewed, Patient's Cardiovascular Status Stable, Respiratory Function Stable, Patent Airway and No signs of Nausea or vomiting  Post-op Vital Signs: Reviewed and stable  Complications: No complications documented.

## 2020-07-26 NOTE — Anesthesia Preprocedure Evaluation (Signed)
Anesthesia Evaluation  Patient identified by MRN, date of birth, ID band Patient awake    Reviewed: Allergy & Precautions, H&P , NPO status , Patient's Chart, lab work & pertinent test results  Airway Mallampati: II  TM Distance: >3 FB Neck ROM: full    Dental no notable dental hx.    Pulmonary    Pulmonary exam normal breath sounds clear to auscultation       Cardiovascular hypertension, Normal cardiovascular exam Rhythm:regular Rate:Normal     Neuro/Psych    GI/Hepatic   Endo/Other  Hypothyroidism   Renal/GU      Musculoskeletal   Abdominal   Peds  Hematology   Anesthesia Other Findings   Reproductive/Obstetrics                             Anesthesia Physical Anesthesia Plan  ASA: II  Anesthesia Plan: MAC   Post-op Pain Management:    Induction:   PONV Risk Score and Plan: 2 and Treatment may vary due to age or medical condition, TIVA and Midazolam  Airway Management Planned:   Additional Equipment:   Intra-op Plan:   Post-operative Plan:   Informed Consent: I have reviewed the patients History and Physical, chart, labs and discussed the procedure including the risks, benefits and alternatives for the proposed anesthesia with the patient or authorized representative who has indicated his/her understanding and acceptance.     Dental Advisory Given  Plan Discussed with: CRNA  Anesthesia Plan Comments:         Anesthesia Quick Evaluation

## 2020-07-26 NOTE — Op Note (Signed)
OPERATIVE NOTE  Wendy Love [id number] 07/26/2020   PREOPERATIVE DIAGNOSIS:  Nuclear sclerotic cataract left eye. H25.12   POSTOPERATIVE DIAGNOSIS:    Nuclear sclerotic cataract left eye.     PROCEDURE:  Phacoemusification with posterior chamber intraocular lens placement of the left eye  Ultrasound time: Procedure(s): CATARACT EXTRACTION PHACO AND INTRAOCULAR LENS PLACEMENT (IOC) LEFT 6.30 01:10.2 9.0% (Left)  LENS:   Implant Name Type Inv. Item Serial No. Manufacturer Lot No. LRB No. Used Action  LENS IOL TECNIS EYHANCE 20.5 - [id number] Intraocular Lens LENS IOL TECNIS EYHANCE 20.5 [id number] JOHNSON   Left 1 Implanted      SURGEON:  Wyonia Hough, MD   ANESTHESIA:  Topical with tetracaine drops and 2% Xylocaine jelly, augmented with 1% preservative-free intracameral lidocaine.    COMPLICATIONS:  None.   DESCRIPTION OF PROCEDURE:  The patient was identified in the holding room and transported to the operating room and placed in the supine position under the operating microscope.  The left eye was identified as the operative eye and it was prepped and draped in the usual sterile ophthalmic fashion.   A 1 millimeter clear-corneal paracentesis was made at the 1:30 position.  0.5 ml of preservative-free 1% lidocaine was injected into the anterior chamber.  The anterior chamber was filled with Viscoat viscoelastic.  A 2.4 millimeter keratome was used to make a near-clear corneal incision at the 10:30 position.  .  A curvilinear capsulorrhexis was made with a cystotome and capsulorrhexis forceps.  Balanced salt solution was used to hydrodissect and hydrodelineate the nucleus.   Phacoemulsification was then used in stop and chop fashion to remove the lens nucleus and epinucleus.  The remaining cortex was then removed using the irrigation and aspiration handpiece. Provisc was then placed into the capsular bag to distend it for lens placement.  A lens was then injected  into the capsular bag.  The remaining viscoelastic was aspirated.   Wounds were hydrated with balanced salt solution.  The anterior chamber was inflated to a physiologic pressure with balanced salt solution.  No wound leaks were noted. Cefuroxime 0.1 ml of a 10mg /ml solution was injected into the anterior chamber for a dose of 1 mg of intracameral antibiotic at the completion of the case.   Timolol and Brimonidine drops were applied to the eye.  The patient was taken to the recovery room in stable condition without complications of anesthesia or surgery.  [name] 07/26/2020, 1:13 PM

## 2020-07-26 NOTE — Anesthesia Postprocedure Evaluation (Signed)
Anesthesia Post Note  Patient: Wendy Love  Procedure(s) Performed: CATARACT EXTRACTION PHACO AND INTRAOCULAR LENS PLACEMENT (IOC) LEFT 6.30 01:10.2 9.0% (Left Eye)     Patient location during evaluation: PACU Anesthesia Type: MAC Level of consciousness: awake and alert and oriented Pain management: satisfactory to patient Vital Signs Assessment: post-procedure vital signs reviewed and stable Respiratory status: spontaneous breathing, nonlabored ventilation and respiratory function stable Cardiovascular status: blood pressure returned to baseline and stable Postop Assessment: Adequate PO intake and No signs of nausea or vomiting Anesthetic complications: no   No complications documented.  Raliegh Ip

## 2020-07-27 ENCOUNTER — Encounter: Payer: Self-pay | Admitting: Ophthalmology

## 2020-08-03 DIAGNOSIS — R197 Diarrhea, unspecified: Secondary | ICD-10-CM | POA: Diagnosis not present

## 2020-08-03 DIAGNOSIS — R509 Fever, unspecified: Secondary | ICD-10-CM | POA: Diagnosis not present

## 2020-08-08 ENCOUNTER — Other Ambulatory Visit: Payer: Self-pay | Admitting: Internal Medicine

## 2020-08-08 DIAGNOSIS — I10 Essential (primary) hypertension: Secondary | ICD-10-CM

## 2020-08-14 ENCOUNTER — Ambulatory Visit: Payer: Medicare HMO | Admitting: Family

## 2020-08-14 DIAGNOSIS — H2511 Age-related nuclear cataract, right eye: Secondary | ICD-10-CM | POA: Diagnosis not present

## 2020-08-14 DIAGNOSIS — I10 Essential (primary) hypertension: Secondary | ICD-10-CM | POA: Diagnosis not present

## 2020-08-21 ENCOUNTER — Ambulatory Visit: Payer: Medicare HMO | Admitting: Family

## 2020-08-21 ENCOUNTER — Other Ambulatory Visit: Payer: Self-pay

## 2020-08-21 ENCOUNTER — Encounter: Payer: Self-pay | Admitting: Family

## 2020-08-21 VITALS — BP 150/80 | HR 79 | Ht 63.0 in | Wt 135.0 lb

## 2020-08-21 DIAGNOSIS — M791 Myalgia, unspecified site: Secondary | ICD-10-CM | POA: Diagnosis not present

## 2020-08-21 DIAGNOSIS — I739 Peripheral vascular disease, unspecified: Secondary | ICD-10-CM | POA: Diagnosis not present

## 2020-08-21 DIAGNOSIS — I700 Atherosclerosis of aorta: Secondary | ICD-10-CM | POA: Diagnosis not present

## 2020-08-21 DIAGNOSIS — I10 Essential (primary) hypertension: Secondary | ICD-10-CM | POA: Diagnosis not present

## 2020-08-21 MED ORDER — ASPIRIN EC 81 MG PO TBEC: 81.0000 mg | DELAYED_RELEASE_TABLET | ORAL | 3 refills | Status: DC

## 2020-08-21 NOTE — Progress Notes (Signed)
Office Visit    Patient Name: Wendy Love Date of Encounter: 08/21/2020  Primary Care Provider:  Crecencio Mc, MD Primary Cardiologist:  Ida Rogue, MD Electrophysiologist:  None   Chief Complaint    Wendy Love is a 79 y.o. female with a hx of atrial tachycardia, hypertension, aortic atherosclerosis, PAD, hyperlipidemia presents today for follow-up of aortic sclerosis and PAD.  Past Medical History    Past Medical History:  Diagnosis Date  . Anxiety   . Bronchitis   . Cancer (HCC)    squamous cell  . Degenerative disc disease, lumbar   . Depression   . Endometriosis 1974   s/p abdominal surgery, 2nd surgery  . High cholesterol   . Hypertension   . Hypothyroidism   . Rhinosinusitis   . Sciatica    Past Surgical History:  Procedure Laterality Date  . ABDOMINAL HYSTERECTOMY  2000  . APPENDECTOMY    . CARPAL TUNNEL RELEASE     bilateral  . CATARACT EXTRACTION W/PHACO Left 07/26/2020   Procedure: CATARACT EXTRACTION PHACO AND INTRAOCULAR LENS PLACEMENT (IOC) LEFT 6.30 01:10.2 9.0%;  Surgeon: Leandrew Koyanagi, MD;  Location: [facility];  Service: Ophthalmology;  Laterality: Left;  . Endoscopy/colonoscopy  April 2012   Schatski's Ring , small hiatal hernia  . hemilaminectomy  April 2010   L3-L4hemi, L4-L5 microdiskectomy  . KNEE ARTHROSCOPY WITH LATERAL MENISECTOMY Right 04/08/2018   Procedure: KNEE ARTHROSCOPY WITH PARTIAL LATERAL MENISECTOMY and debridement.;  Surgeon: Corky Mull, MD;  Location: [facility];  Service: Orthopedics;  Laterality: Right;    Allergies  Allergies  Allergen Reactions  . Atorvastatin Other (See Comments)    myalgias  . Brompheniramine-Pseudoeph     REACTION: agitation, nervous, sleepless  . Codeine     REACTION: GI Upset  . Diphenhydramine Hcl     REACTION: nervous, sleepless, agitated  . Hctz [Hydrochlorothiazide] Other (See Comments)    Severe hyponatremia  . Loratadine      REACTION: nervous, sleeplessness  . Triamterene     Hyperkalemia and hyponatremia  . Tramadol Hcl Rash    hyperactivity    History of Present Illness    Wendy Love is a 79 y.o. female with a hx of atrial tachycardia, hypertension, aortic atherosclerosis, PAD, hyperlipidemia last seen 07/2017 by Dr. Rockey Situ.  She has had previous renal artery duplex with no evidence of renal artery stenosis.  There is no difference between her blood pressure readings in clinic and at home with concern for whitecoat hypertension.  She had ABIs 09/29/2018 with 1.17 on the right and 0.9 on the left with velocities in the left SFA mildly elevated.  This was overall stable compared to previous.  Most recently lipid panel 09/16/2019 total cholesterol 229, LDL 128, triglycerides 78, HDL 85.8.  History of myalgias with atorvastatin.  Presents today for follow up. She is very involved with her church and prayer in [place]. She also enjoys staying active. Tells me she had "life line screening" in June and tells me her primary care provider was pleased with the result. She was somewhat confused as to why she was seeing cardiology and we reviewed her history of atrial tachycardia and aortic atherosclerosis. Reports no shortness of breath nor dyspnea on exertion. Reports no chest pain, pressure, or tightness. No edema, orthopnea, PND. Reports no palpitations. Checks her blood pressure at home with arm cuff. Her blood pressure at home is routinely 140/80s.   EKGs/Labs/Other Studies Reviewed:   The  following studies were reviewed today:  Vascular ultrasound and ABI 09/2018 Summary:  Right: Resting right ankle-brachial index is within normal range. No  evidence of significant right lower extremity arterial disease. The right  toe-brachial index is normal.    Left: Resting left ankle-brachial index indicates mild left lower  extremity arterial disease. The left toe-brachial index is abnormal.   EKG:  EKG is  ordered  today.  The ekg ordered today demonstrates NSR 79 bpm with no acute ST/T wave changes.   Recent Labs: 09/16/2019: TSH 2.18 04/11/2020: ALT 18; BUN 18; Creatinine, Ser 0.62; Potassium 4.4; Sodium 135  Recent Lipid Panel    Component Value Date/Time   CHOL 221 (H) 09/16/2019 1103   TRIG 78.0 09/16/2019 1103   HDL 85.80 09/16/2019 1103   CHOLHDL 3 09/16/2019 1103   VLDL 15.6 09/16/2019 1103   LDLCALC 120 (H) 09/16/2019 1103   LDLCALC 124 (H) 09/23/2018 0840   LDLDIRECT 115.0 08/12/2018 1417    Home Medications   Current Meds  Medication Sig  . amLODipine (NORVASC) 5 MG tablet TAKE 1 TABLET BY MOUTH EVERY DAY  . b complex vitamins tablet Take 1 tablet by mouth daily.  [name] CALCIUM-MAGNESIUM PO Take by mouth.  . Cholecalciferol (VITAMIN D3) 2000 UNITS TABS Take 5,000 Units by mouth daily.  . Coenzyme Q10 (CO Q-10) 100 MG CAPS Take by mouth daily.  [name] levothyroxine (SYNTHROID) 75 MCG tablet TAKE 1 TABLET BY MOUTH EVERY DAY  . losartan (COZAAR) 50 MG tablet TAKE 2 TABLETS BY MOUTH EVERY DAY  . Magnesium 300 MG CAPS Take by mouth daily.  . melatonin 1 MG TABS tablet Take 2 mg by mouth at bedtime.  . Multiple Vitamin (MULTIVITAMIN) tablet Take 1 tablet by mouth daily.  . Omega-3 Fatty Acids (FISH OIL PO) Take by mouth daily.  [name] PROBIOTIC CAPS Take by mouth daily.  . temazepam (RESTORIL) 15 MG capsule Take 1 capsule (15 mg total) by mouth at bedtime.  . tretinoin (RETIN-A) 0.025 % cream APPLY TO WHOLE FACE EACH NIGHT.  [name] vitamin C (ASCORBIC ACID) 500 MG tablet Take 1,000 mg by mouth daily.   . Zinc 25 MG TABS Take 25 mg by mouth daily.     Review of Systems  Review of Systems  Constitutional: Negative for chills, fever and malaise/fatigue.  Cardiovascular: Negative for chest pain, dyspnea on exertion, irregular heartbeat, leg swelling, near-syncope, orthopnea, palpitations and syncope.  Respiratory: Negative for cough, shortness of breath and wheezing.   Gastrointestinal: Negative for  melena, nausea and vomiting.  Genitourinary: Negative for hematuria.  Neurological: Negative for dizziness, light-headedness and weakness.   All other systems reviewed and are otherwise negative except as noted above.  Physical Exam    VS:  BP (!) 150/80   Pulse 79   Ht 5\' 3"  (1.6 m)   Wt 135 lb (61.2 kg)   SpO2 98%   BMI 23.91 kg/m  , BMI Body mass index is 23.91 kg/m.  Wt Readings from Last 3 Encounters:  08/21/20 135 lb (61.2 kg)  07/26/20 138 lb (62.6 kg)  04/11/20 136 lb 9.6 oz (62 kg)    GEN: Well nourished, well developed, in no acute distress. HEENT: normal. Neck: Supple, no JVD, carotid bruits, or masses. Cardiac: RRR, no murmurs, rubs, or gallops. No clubbing, cyanosis, edema.  Radials/PT 2+ and equal bilaterally.  Respiratory:  Respirations regular and unlabored, clear to auscultation bilaterally. GI: Soft, nontender, nondistended. MS: No deformity or atrophy. Skin: Warm and  dry, no rash. Neuro:  Strength and sensation are intact. Psych: Normal affect.  Assessment & Plan    1. Aortic atherosclerosis- EKG today NSR with no acute ST/T wave changes. No anginal symptoms. Endorses staying active and eating low sodium, heart healthy diet. Intolerant to statin, lipid management as below. Recommend she take Aspirin EC 81mg  she is hesitant to take daily and agreeable to take every other day for secondary prevention.   2. Peripheral arterial disease- Follows with vascular surgery. Reports no symptoms suggestive of claudication. Start Aspirin. Lipid management, as below.   3. Hypertension - BP mildly elevated in clinic though well controlled at home. Known history of white coat hypertension. Continue present antihypertensive regimen per PCP.   4. HLD- Lipid panel 09/16/19 with total cholesterol 221, LDL 120. LDL goal <100 and ideally <70 in setting of aortic atherosclerosis and PAD. She has previous intolerance to statin with myalgia and politely declines to take statin. She  does take fish oil daily. We discussed Zetia and she tells me she will consider and discuss with her PCP at her appt in February.   5. Atrial tachycardia- reports no recurrent palpitations. She attributes this to stress in the past.   Disposition: Follow up in 1 year(s) with Dr. Rockey Situ or APP  Signed, Loel Dubonnet, NP 08/21/2020, 2:14 PM [place]

## 2020-08-21 NOTE — Patient Instructions (Addendum)
Medication Instructions:  Your physician has recommended you make the following change in your medication:   START Aspirin EC 81mg    *If you need a refill on your cardiac medications before your next appointment, please call your pharmacy*  Lab Work: None ordered today.  When you have your cholesterol panel checked the next time, we will consider the addition of Ezetimibe (Zetia) which helps lower your LDL (bad cholesterol). Our goal is for your LDL to be less than 100 to lower your risk of any cardiovascular disease.   Testing/Procedures: Your EKG today showed normal sinus rhythm which is a great result!  Follow-Up: At [facility], you and your health needs are our priority.  As part of our continuing mission to provide you with exceptional heart care, we have created designated Provider Care Teams.  These Care Teams include your primary Cardiologist (physician) and Advanced Practice Providers (APPs -  Physician Assistants and Nurse Practitioners) who all work together to provide you with the care you need, when you need it.  Your next appointment:   1 year(s)  The format for your next appointment:   In Person  Provider:   You may see Ida Rogue, MD or one of the following Advanced Practice Providers on your designated Care Team:    Murray Hodgkins, NP  Christell Faith, PA-C  Marrianne Mood, PA-C  Cadence Kathlen Mody, [place]  Laurann Montana, NP   Other Instructions  Ezetimibe Tablets What is this medicine? EZETIMIBE (ez ET i mibe) blocks the absorption of cholesterol from the stomach. It can help lower blood cholesterol for patients who are at risk of getting heart disease or a stroke. It is only for patients whose cholesterol level is not controlled by diet. This medicine may be used for other purposes; ask your health care provider or pharmacist if you have questions. COMMON BRAND NAME(S): Zetia What should I tell my health care provider before I take this medicine? They  need to know if you have any of these conditions:  liver disease  an unusual or allergic reaction to ezetimibe, medicines, foods, dyes, or preservatives  pregnant or trying to get pregnant  breast-feeding How should I use this medicine? Take this medicine by mouth with a glass of water. Follow the directions on the prescription label. This medicine can be taken with or without food. Take your doses at regular intervals. Do not take your medicine more often than directed. Talk to your pediatrician regarding the use of this medicine in children. Special care may be needed. Overdosage: If you think you have taken too much of this medicine contact a poison control center or emergency room at once. NOTE: This medicine is only for you. Do not share this medicine with others. What if I miss a dose? If you miss a dose, take it as soon as you can. If it is almost time for your next dose, take only that dose. Do not take double or extra doses. What may interact with this medicine? Do not take this medicine with any of the following medications:  fenofibrate  gemfibrozil This medicine may also interact with the following medications:  antacids  cyclosporine  herbal medicines like red yeast rice  other medicines to lower cholesterol or triglycerides This list may not describe all possible interactions. Give your health care provider a list of all the medicines, herbs, non-prescription drugs, or dietary supplements you use. Also tell them if you smoke, drink alcohol, or use illegal drugs. Some items may interact  with your medicine. What should I watch for while using this medicine? Visit your doctor or health care professional for regular checks on your progress. You will need to have your cholesterol levels checked. If you are also taking some other cholesterol medicines, you will also need to have tests to make sure your liver is working properly. Tell your doctor or health care professional  if you get any unexplained muscle pain, tenderness, or weakness, especially if you also have a fever and tiredness. You need to follow a low-cholesterol, low-fat diet while you are taking this medicine. This will decrease your risk of getting heart and blood vessel disease. Exercising and avoiding alcohol and smoking can also help. Ask your doctor or dietician for advice. What side effects may I notice from receiving this medicine? Side effects that you should report to your doctor or health care professional as soon as possible:  allergic reactions like skin rash, itching or hives, swelling of the face, lips, or tongue  dark yellow or brown urine  unusually weak or tired  yellowing of the skin or eyes Side effects that usually do not require medical attention (report to your doctor or health care professional if they continue or are bothersome):  diarrhea  dizziness  headache  stomach upset or pain This list may not describe all possible side effects. Call your doctor for medical advice about side effects. You may report side effects to FDA at 1-800-FDA-1088. Where should I keep my medicine? Keep out of the reach of children. Store at room temperature between 15 and 30 degrees C (59 and 86 degrees F). Protect from moisture. Keep container tightly closed. Throw away any unused medicine after the expiration date. NOTE: This sheet is a summary. It may not cover all possible information. If you have questions about this medicine, talk to your doctor, pharmacist, or health care provider.  2020 Elsevier/Gold Standard (2012-03-02 15:39:09)

## 2020-08-22 ENCOUNTER — Encounter: Payer: Self-pay | Admitting: Ophthalmology

## 2020-08-22 ENCOUNTER — Encounter: Payer: Self-pay | Admitting: Anesthesiology

## 2020-08-28 ENCOUNTER — Other Ambulatory Visit
Admission: RE | Admit: 2020-08-28 | Discharge: 2020-08-28 | Disposition: A | Discharge status: Home / Self Care | Payer: Medicare HMO | Source: Ambulatory Visit | Attending: Ophthalmology | Admitting: Ophthalmology

## 2020-08-28 ENCOUNTER — Other Ambulatory Visit: Payer: Self-pay

## 2020-08-28 DIAGNOSIS — Z20822 Contact with and (suspected) exposure to covid-19: Secondary | ICD-10-CM | POA: Insufficient documentation

## 2020-08-28 DIAGNOSIS — Z01812 Encounter for preprocedural laboratory examination: Secondary | ICD-10-CM | POA: Diagnosis not present

## 2020-08-28 LAB — SARS CORONAVIRUS 2 (TAT 6-24 HRS): SARS Coronavirus 2: NEGATIVE

## 2020-08-28 NOTE — Discharge Instructions (Signed)

## 2020-08-30 DIAGNOSIS — R69 Illness, unspecified: Secondary | ICD-10-CM | POA: Diagnosis not present

## 2020-09-20 ENCOUNTER — Ambulatory Visit: Admission: RE | Admit: 2020-09-20 | Payer: Medicare HMO | Source: Home / Self Care | Admitting: Ophthalmology

## 2020-09-20 SURGERY — PHACOEMULSIFICATION, CATARACT, WITH IOL INSERTION
Anesthesia: Topical | Laterality: Right

## 2020-09-28 ENCOUNTER — Other Ambulatory Visit: Payer: Self-pay | Admitting: Internal Medicine

## 2020-10-09 ENCOUNTER — Other Ambulatory Visit: Payer: Self-pay

## 2020-10-09 ENCOUNTER — Encounter: Payer: Self-pay | Admitting: Ophthalmology

## 2020-10-11 ENCOUNTER — Other Ambulatory Visit: Payer: Self-pay

## 2020-10-11 ENCOUNTER — Encounter: Payer: Self-pay | Admitting: Internal Medicine

## 2020-10-11 ENCOUNTER — Ambulatory Visit (INDEPENDENT_AMBULATORY_CARE_PROVIDER_SITE_OTHER): Payer: Medicare HMO | Admitting: Internal Medicine

## 2020-10-11 ENCOUNTER — Ambulatory Visit (INDEPENDENT_AMBULATORY_CARE_PROVIDER_SITE_OTHER): Payer: Medicare HMO

## 2020-10-11 VITALS — BP 135/75 | HR 94 | Temp 98.9°F | Resp 16 | Ht 63.0 in | Wt 135.8 lb

## 2020-10-11 DIAGNOSIS — I10 Essential (primary) hypertension: Secondary | ICD-10-CM

## 2020-10-11 DIAGNOSIS — U071 COVID-19: Secondary | ICD-10-CM | POA: Diagnosis not present

## 2020-10-11 DIAGNOSIS — Z8616 Personal history of COVID-19: Secondary | ICD-10-CM | POA: Diagnosis not present

## 2020-10-11 DIAGNOSIS — I150 Renovascular hypertension: Secondary | ICD-10-CM

## 2020-10-11 DIAGNOSIS — E034 Atrophy of thyroid (acquired): Secondary | ICD-10-CM

## 2020-10-11 LAB — COMPREHENSIVE METABOLIC PANEL
ALT: 20 U/L (ref 0–35)
AST: 25 U/L (ref 0–37)
Albumin: 4.2 g/dL (ref 3.5–5.2)
Alkaline Phosphatase: 74 U/L (ref 39–117)
BUN: 11 mg/dL (ref 6–23)
CO2: 31 mEq/L (ref 19–32)
Calcium: 9.3 mg/dL (ref 8.4–10.5)
Chloride: 101 mEq/L (ref 96–112)
Creatinine, Ser: 0.59 mg/dL (ref 0.40–1.20)
GFR: 85.14 mL/min (ref >60.00)
Glucose, Bld: 116 mg/dL — ABNORMAL HIGH (ref 70–99)
Potassium: 3.9 mEq/L (ref 3.5–5.1)
Sodium: 138 mEq/L (ref 135–145)
Total Bilirubin: 0.5 mg/dL (ref 0.2–1.2)
Total Protein: 6.4 g/dL (ref 6.0–8.3)

## 2020-10-11 LAB — TSH: TSH: 0.91 u[IU]/mL (ref 0.35–4.50)

## 2020-10-11 MED ORDER — TEMAZEPAM 15 MG PO CAPS
15.0000 mg | ORAL_CAPSULE | Freq: Every day | ORAL | 5 refills | Status: DC
Start: 2020-10-11 — End: 2021-09-26

## 2020-10-11 NOTE — Patient Instructions (Signed)
Good to see you!   no changes today.  I'll see you for your annual exam in 6 months

## 2020-10-11 NOTE — Progress Notes (Signed)
Subjective:  Patient ID: Wendy Love, female    DOB: 1940-10-07  Age: 80 y.o. MRN: [id number]  CC: The primary encounter diagnosis was Hypothyroidism due to acquired atrophy of thyroid. Diagnoses of Renovascular hypertension, Personal history of COVID-19, and White coat syndrome with hypertension were also pertinent to this visit.  HPI [name] presents for *followup on hypertension, with [facility],  Insomnia, and hypothyrodisn  This visit occurred during the SARS-CoV-2 public health emergency.  Safety protocols were in place, including screening questions prior to the visit, additional usage of staff PPE, and extensive cleaning of exam room while observing appropriate contact time as indicated for disinfecting solutions.    Patient has declined the influenza and  COVID 19 vaccines because she has natural immunity due to recent infection in December.   Patient continues to mask when outside of the home except when walking in yard or at safe distances from others .  Patient denies any change in mood or development of unhealthy behaviors resuting from the pandemic's restriction of activities and socialization.    Treated for COVID INFECTION which was diagnosed on Dec 24,  With ivermectin and budesonide, .  Cataract surgery postponed.  energy level  Has nearly normalized,  But feels like she has brain fog. (mild0  Reviewed findings of prior CT scan today noting aortic atherosclerosis .[name]  Patient  Has a history of statin myalgias even with trail of twice weekly rosuvastatin and  not interested in a retrial .    Hypertension: patient checks blood pressure twice weekly at home.  Readings have been for the most part <140/80 at rest . Patient is following a reduce salt diet most days and is taking medications as prescribed    Outpatient Medications Prior to Visit  Medication Sig Dispense Refill   amLODipine (NORVASC) 5 MG tablet TAKE 1 TABLET BY MOUTH EVERY DAY 90 tablet 1    aspirin EC 81 MG tablet Take 1 tablet (81 mg total) by mouth every other day. Swallow whole. 90 tablet 3   b complex vitamins tablet Take 1 tablet by mouth daily.     CALCIUM-MAGNESIUM PO Take by mouth.     Cholecalciferol (VITAMIN D3) 2000 UNITS TABS Take 5,000 Units by mouth daily.     Coenzyme Q10 (CO Q-10) 100 MG CAPS Take by mouth daily.     levothyroxine (SYNTHROID) 75 MCG tablet TAKE 1 TABLET BY MOUTH EVERY DAY 90 tablet 0   losartan (COZAAR) 50 MG tablet TAKE 2 TABLETS BY MOUTH EVERY DAY 180 tablet 1   Magnesium 300 MG CAPS Take by mouth daily.     melatonin 1 MG TABS tablet Take 2 mg by mouth at bedtime.     Multiple Vitamin (MULTIVITAMIN) tablet Take 1 tablet by mouth daily.     Omega-3 Fatty Acids (FISH OIL PO) Take by mouth daily.     PROBIOTIC CAPS Take by mouth daily.     tretinoin (RETIN-A) 0.025 % cream APPLY TO WHOLE FACE EACH NIGHT.  5   vitamin C (ASCORBIC ACID) 500 MG tablet Take 1,000 mg by mouth daily.      Zinc 25 MG TABS Take 25 mg by mouth daily.     temazepam (RESTORIL) 15 MG capsule Take 1 capsule (15 mg total) by mouth at bedtime. 30 capsule 2   No facility-administered medications prior to visit.    Review of Systems;  Patient denies headache, fevers, malaise, unintentional weight loss, skin rash, eye pain, sinus congestion  and sinus pain, sore throat, dysphagia,  hemoptysis , cough, dyspnea, wheezing, chest pain, palpitations, orthopnea, edema, abdominal pain, nausea, melena, diarrhea, constipation, flank pain, dysuria, hematuria, urinary  Frequency, nocturia, numbness, tingling, seizures,  Focal weakness, Loss of consciousness,  Tremor, insomnia, depression, anxiety, and suicidal ideation.      Objective:  BP 135/75    Pulse 94    Temp 98.9 F (37.2 C) (Oral)    Resp 16    Ht 5\' 3"  (1.6 m)    Wt 135 lb 12.8 oz (61.6 kg)    SpO2 98%    BMI 24.06 kg/m   BP Readings from Last 3 Encounters:  10/11/20 135/75  08/21/20 (!) 150/80  07/26/20 (!)  146/77    Wt Readings from Last 3 Encounters:  10/11/20 135 lb 12.8 oz (61.6 kg)  08/21/20 135 lb (61.2 kg)  07/26/20 138 lb (62.6 kg)    General appearance: alert, cooperative and appears stated age Ears: normal TM's and external ear canals both ears Throat: lips, mucosa, and tongue normal; teeth and gums normal Neck: no adenopathy, no carotid bruit, supple, symmetrical, trachea midline and thyroid not enlarged, symmetric, no tenderness/mass/nodules Back: symmetric, no curvature. ROM normal. No CVA tenderness. Lungs: clear to auscultation bilaterally Heart: regular rate and rhythm, S1, S2 normal, no murmur, click, rub or gallop Abdomen: soft, non-tender; bowel sounds normal; no masses,  no organomegaly Pulses: 2+ and symmetric Skin: Skin color, texture, turgor normal. No rashes or lesions Lymph nodes: Cervical, supraclavicular, and axillary nodes normal.  Lab Results  Component Value Date   HGBA1C 5.2 09/16/2019   HGBA1C 5.4 02/25/2019   HGBA1C 5.1 08/12/2018    Lab Results  Component Value Date   CREATININE 0.59 10/11/2020   CREATININE 0.62 04/11/2020   CREATININE 0.60 09/16/2019    Lab Results  Component Value Date   WBC 5.1 09/23/2018   HGB 14.5 09/23/2018   HCT 40.5 09/23/2018   PLT 224 09/23/2018   GLUCOSE 116 (H) 10/11/2020   CHOL 221 (H) 09/16/2019   TRIG 78.0 09/16/2019   HDL 85.80 09/16/2019   LDLDIRECT 115.0 08/12/2018   LDLCALC 120 (H) 09/16/2019   ALT 20 10/11/2020   AST 25 10/11/2020   NA 138 10/11/2020   K 3.9 10/11/2020   CL 101 10/11/2020   CREATININE 0.59 10/11/2020   BUN 11 10/11/2020   CO2 31 10/11/2020   TSH 0.91 10/11/2020   HGBA1C 5.2 09/16/2019   MICROALBUR <0.7 04/11/2020    No results found.  Assessment & Plan:   Problem List Items Addressed This Visit      Unprioritized   Hypothyroidism - Primary    Thyroid function is WNL on current dose.  No current changes needed.  Lab Results  Component Value Date   TSH 0.91  10/11/2020         Relevant Orders   TSH (Completed)   Personal history of COVID-19    She recovered uneventfully without scarring on chest x ray.  She declines vaccination in favor of natural immunity       Relevant Orders   DG Chest 2 View (Completed)   Renovascular hypertension   Relevant Orders   Comprehensive metabolic panel (Completed)   White coat syndrome with hypertension    Home readings are much lower than in house readings .  She has no proteinuria .  Continue 5 mg amlodipine and 50 mg losartan.   Lab Results  Component Value Date   CREATININE 0.59 10/11/2020  Lab Results  Component Value Date   NA 138 10/11/2020   K 3.9 10/11/2020   CL 101 10/11/2020   CO2 31 10/11/2020   Lab Results  Component Value Date   MICROALBUR <0.7 04/11/2020   MICROALBUR 0.2 11/26/2013              I am having Karyl W. Geralyn Flash "Polly" maintain her b complex vitamins, Vitamin D3, Probiotic, multivitamin, vitamin C, Magnesium, Omega-3 Fatty Acids (FISH OIL PO), tretinoin, CALCIUM-MAGNESIUM PO, Zinc, losartan, Co Q-10, melatonin, amLODipine, aspirin EC, levothyroxine, and temazepam.  Meds ordered this encounter  Medications   temazepam (RESTORIL) 15 MG capsule    Sig: Take 1 capsule (15 mg total) by mouth at bedtime.    Dispense:  30 capsule    Refill:  5    30 days only.   Controlled substance    Medications Discontinued During This Encounter  Medication Reason   temazepam (RESTORIL) 15 MG capsule Reorder    Follow-up: No follow-ups on file.   Crecencio Mc, MD

## 2020-10-12 ENCOUNTER — Encounter: Payer: Self-pay | Admitting: Internal Medicine

## 2020-10-12 NOTE — Assessment & Plan Note (Signed)
Thyroid function is WNL on current dose.  No current changes needed.  Lab Results  Component Value Date   TSH 0.91 10/11/2020

## 2020-10-12 NOTE — Assessment & Plan Note (Addendum)
She recovered uneventfully without scarring on chest x ray.  She declines vaccination in favor of natural immunity

## 2020-10-12 NOTE — Assessment & Plan Note (Addendum)
Home readings are much lower than in house readings .  She has no proteinuria .  Continue 5 mg amlodipine and 50 mg losartan.   Lab Results  Component Value Date   CREATININE 0.59 10/11/2020   Lab Results  Component Value Date   NA 138 10/11/2020   K 3.9 10/11/2020   CL 101 10/11/2020   CO2 31 10/11/2020   Lab Results  Component Value Date   MICROALBUR <0.7 04/11/2020   MICROALBUR 0.2 11/26/2013

## 2020-10-16 ENCOUNTER — Other Ambulatory Visit
Admission: RE | Admit: 2020-10-16 | Discharge: 2020-10-16 | Disposition: A | Discharge status: Home / Self Care | Payer: Medicare HMO | Source: Ambulatory Visit | Attending: Ophthalmology | Admitting: Ophthalmology

## 2020-10-16 ENCOUNTER — Other Ambulatory Visit: Payer: Self-pay

## 2020-10-16 DIAGNOSIS — Z20822 Contact with and (suspected) exposure to covid-19: Secondary | ICD-10-CM | POA: Insufficient documentation

## 2020-10-16 DIAGNOSIS — Z01812 Encounter for preprocedural laboratory examination: Secondary | ICD-10-CM | POA: Diagnosis not present

## 2020-10-16 NOTE — Discharge Instructions (Signed)

## 2020-10-17 LAB — SARS CORONAVIRUS 2 (TAT 6-24 HRS): SARS Coronavirus 2: NEGATIVE

## 2020-10-18 ENCOUNTER — Encounter
Admission: RE | Disposition: A | Discharge status: Home / Self Care | Payer: Self-pay | Source: Ambulatory Visit | Attending: Ophthalmology

## 2020-10-18 ENCOUNTER — Ambulatory Visit
Admission: RE | Admit: 2020-10-18 | Discharge: 2020-10-18 | Disposition: A | Discharge status: Home / Self Care | Payer: Medicare HMO | Source: Ambulatory Visit | Attending: Ophthalmology | Admitting: Ophthalmology

## 2020-10-18 ENCOUNTER — Ambulatory Visit: Payer: Medicare HMO | Admitting: Anesthesiology

## 2020-10-18 ENCOUNTER — Encounter: Payer: Self-pay | Admitting: Ophthalmology

## 2020-10-18 ENCOUNTER — Other Ambulatory Visit: Payer: Self-pay

## 2020-10-18 DIAGNOSIS — H2511 Age-related nuclear cataract, right eye: Secondary | ICD-10-CM | POA: Diagnosis not present

## 2020-10-18 DIAGNOSIS — Z79890 Hormone replacement therapy (postmenopausal): Secondary | ICD-10-CM | POA: Diagnosis not present

## 2020-10-18 DIAGNOSIS — H25811 Combined forms of age-related cataract, right eye: Secondary | ICD-10-CM | POA: Diagnosis not present

## 2020-10-18 DIAGNOSIS — Z79899 Other long term (current) drug therapy: Secondary | ICD-10-CM | POA: Diagnosis not present

## 2020-10-18 DIAGNOSIS — Z8616 Personal history of COVID-19: Secondary | ICD-10-CM | POA: Diagnosis not present

## 2020-10-18 HISTORY — DX: COVID-19: U07.1

## 2020-10-18 HISTORY — PX: CATARACT EXTRACTION W/PHACO: SHX586

## 2020-10-18 SURGERY — PHACOEMULSIFICATION, CATARACT, WITH IOL INSERTION
Anesthesia: Monitor Anesthesia Care | Site: Eye | Laterality: Right

## 2020-10-18 MED ORDER — MIDAZOLAM HCL 2 MG/2ML IJ SOLN
INTRAMUSCULAR | Status: DC | PRN
  Administered 2020-10-18: 1 mg via INTRAVENOUS
  Administered 2020-10-18: .5 mg via INTRAVENOUS

## 2020-10-18 MED ORDER — LACTATED RINGERS IV SOLN: INTRAVENOUS | Status: DC

## 2020-10-18 MED ORDER — BRIMONIDINE TARTRATE-TIMOLOL 0.2-0.5 % OP SOLN
OPHTHALMIC | Status: DC | PRN
  Administered 2020-10-18: 1 [drp] via OPHTHALMIC

## 2020-10-18 MED ORDER — FENTANYL CITRATE (PF) 100 MCG/2ML IJ SOLN
INTRAMUSCULAR | Status: DC | PRN
  Administered 2020-10-18: 50 ug via INTRAVENOUS

## 2020-10-18 MED ORDER — CEFUROXIME OPHTHALMIC INJECTION 1 MG/0.1 ML
INJECTION | OPHTHALMIC | Status: DC | PRN
  Administered 2020-10-18: 0.1 mL via INTRACAMERAL

## 2020-10-18 MED ORDER — ARMC OPHTHALMIC DILATING DROPS
1.0000 "application " | OPHTHALMIC | Status: DC | PRN
  Administered 2020-10-18 (×3): 1 via OPHTHALMIC

## 2020-10-18 MED ORDER — TETRACAINE HCL 0.5 % OP SOLN
1.0000 [drp] | OPHTHALMIC | Status: DC | PRN
  Administered 2020-10-18 (×3): 1 [drp] via OPHTHALMIC

## 2020-10-18 MED ORDER — EPINEPHRINE PF 1 MG/ML IJ SOLN
INTRAOCULAR | Status: DC | PRN
  Administered 2020-10-18: 88 mL via OPHTHALMIC

## 2020-10-18 MED ORDER — LIDOCAINE HCL (PF) 2 % IJ SOLN
INTRAOCULAR | Status: DC | PRN
  Administered 2020-10-18: 2 mL

## 2020-10-18 MED ORDER — NA HYALUR & NA CHOND-NA HYALUR 0.4-0.35 ML IO KIT
PACK | INTRAOCULAR | Status: DC | PRN
  Administered 2020-10-18: 1 mL via INTRAOCULAR

## 2020-10-18 SURGICAL SUPPLY — 22 items
CANNULA ANT/CHMB 27G (MISCELLANEOUS) ×1 IMPLANT
CANNULA ANT/CHMB 27GA (MISCELLANEOUS) ×2 IMPLANT
GLOVE SURG LX 7.5 STRW (GLOVE) ×1
GLOVE SURG LX STRL 7.5 STRW (GLOVE) ×1 IMPLANT
GLOVE SURG TRIUMPH 8.0 PF LTX (GLOVE) ×2 IMPLANT
GOWN STRL REUS W/ TWL LRG LVL3 (GOWN DISPOSABLE) ×2 IMPLANT
GOWN STRL REUS W/TWL LRG LVL3 (GOWN DISPOSABLE) ×4
LENS IOL TECNIS EYHANCE 21.0 (Intraocular Lens) ×1 IMPLANT
MARKER SKIN DUAL TIP RULER LAB (MISCELLANEOUS) ×2 IMPLANT
NDL CAPSULORHEX 25GA (NEEDLE) ×1 IMPLANT
NDL FILTER BLUNT 18X1 1/2 (NEEDLE) ×2 IMPLANT
NEEDLE CAPSULORHEX 25GA (NEEDLE) ×2 IMPLANT
NEEDLE FILTER BLUNT 18X 1/2SAF (NEEDLE) ×2
NEEDLE FILTER BLUNT 18X1 1/2 (NEEDLE) ×2 IMPLANT
PACK CATARACT BRASINGTON (MISCELLANEOUS) ×2 IMPLANT
PACK EYE AFTER SURG (MISCELLANEOUS) ×2 IMPLANT
PACK OPTHALMIC (MISCELLANEOUS) ×2 IMPLANT
SOLUTION OPHTHALMIC SALT (MISCELLANEOUS) ×2 IMPLANT
SYR 3ML LL SCALE MARK (SYRINGE) ×4 IMPLANT
SYR TB 1ML LUER SLIP (SYRINGE) ×2 IMPLANT
WATER STERILE IRR 250ML POUR (IV SOLUTION) ×2 IMPLANT
WIPE NON LINTING 3.25X3.25 (MISCELLANEOUS) ×2 IMPLANT

## 2020-10-18 NOTE — Transfer of Care (Signed)
Immediate Anesthesia Transfer of Care Note  Patient: Wendy Love  Procedure(s) Performed: CATARACT EXTRACTION PHACO AND INTRAOCULAR LENS PLACEMENT (IOC) RIGHT 9.18 01:14.6 12.3% (Right Eye)  Patient Location: PACU  Anesthesia Type: MAC  Level of Consciousness: awake, alert  and patient cooperative  Airway and Oxygen Therapy: Patient Spontanous Breathing   Post-op Assessment: Post-op Vital signs reviewed, Patient's Cardiovascular Status Stable, Respiratory Function Stable, Patent Airway and No signs of Nausea or vomiting  Post-op Vital Signs: Reviewed and stable  Complications: No complications documented.

## 2020-10-18 NOTE — Anesthesia Preprocedure Evaluation (Signed)
Anesthesia Evaluation  Patient identified by MRN, date of birth, ID band Patient awake    Reviewed: Allergy & Precautions, H&P , NPO status , Patient's Chart, lab work & pertinent test results  Airway Mallampati: II  TM Distance: >3 FB Neck ROM: full    Dental no notable dental hx.    Pulmonary    Pulmonary exam normal breath sounds clear to auscultation       Cardiovascular Exercise Tolerance: Good hypertension, + Peripheral Vascular Disease  Normal cardiovascular exam Rhythm:regular Rate:Normal     Neuro/Psych PSYCHIATRIC DISORDERS Anxiety Depression  Neuromuscular disease    GI/Hepatic negative GI ROS, Neg liver ROS,   Endo/Other  Hypothyroidism   Renal/GU Renal hypertensionRenal disease  negative genitourinary   Musculoskeletal  (+) Arthritis , Osteoarthritis,    Abdominal Normal abdominal exam  (+) - obese,  Abdomen: soft.    Peds negative pediatric ROS (+)  Hematology negative hematology ROS (+)   Anesthesia Other Findings   Reproductive/Obstetrics negative OB ROS                             Anesthesia Physical  Anesthesia Plan  ASA: III  Anesthesia Plan: MAC   Post-op Pain Management:    Induction:   PONV Risk Score and Plan: 2 and Treatment may vary due to age or medical condition, TIVA and Midazolam  Airway Management Planned: Nasal Cannula and Natural Airway  Additional Equipment:   Intra-op Plan:   Post-operative Plan:   Informed Consent: I have reviewed the patients History and Physical, chart, labs and discussed the procedure including the risks, benefits and alternatives for the proposed anesthesia with the patient or authorized representative who has indicated his/her understanding and acceptance.     Dental Advisory Given  Plan Discussed with: CRNA and Surgeon  Anesthesia Plan Comments:         Anesthesia Quick Evaluation  Patient Active  Problem List   Diagnosis Date Noted  . Personal history of COVID-19 10/11/2020  . Hemorrhoid prolapse 09/21/2019  . Osteopenia after menopause 02/16/2019  . Renovascular hypertension 08/18/2018  . Renal artery stenosis ([place]) 08/18/2018  . Impaired fasting glucose 08/13/2018  . Complex tear of lateral meniscus of right knee as current injury 03/06/2018  . Autosomal dominant hereditary hemochromatosis ([place]) 12/31/2017  . Bursitis, knee 12/31/2017  . Myalgia due to statin 12/30/2017  . Peripheral artery disease ([place]) 07/03/2017  . Tinnitus 11/16/2013  . Encounter for preventive health examination 11/30/2012  . Postmenopausal atrophic vaginitis 11/21/2012  . Hyperlipidemia 03/21/2010  . UNEQUAL LEG LENGTH 02/07/2010  . INSOMNIA, CHRONIC 12/19/2009  . TREMOR 12/19/2009  . Right-sided low back pain with right-sided sciatica 09/27/2008  . Hypothyroidism 10/30/2007  . SYMPTOMATIC MENOPAUSAL/FEMALE CLIMACTERIC STATES 10/30/2007  . White coat syndrome with hypertension 07/30/2007  . Osteoarthritis of spine 07/30/2007    CBC Latest Ref Rng & Units 09/23/2018 12/30/2017 12/11/2016  WBC 3.8 - 10.8 Thousand/uL 5.1 4.9 4.8  Hemoglobin 11.7 - 15.5 g/dL 14.5 14.8 14.8  Hematocrit 35.0 - 45.0 % 40.5 42.1 43.1  Platelets 140 - 400 Thousand/uL 224 252.0 222.0   BMP Latest Ref Rng & Units 10/11/2020 04/11/2020 09/16/2019  Glucose 70 - 99 mg/dL 116(H) 97 93  BUN 6 - 23 mg/dL _0 Creatinine 0.40 - 1.20 mg/dL 0.59 0.62 0.60  BUN/Creat Ratio 6 - 22 (calc) - - -  Sodium 135 - 145 mEq/L 138 135 137  Potassium  3.5 - 5.1 mEq/L 3.9 4.4 4.1  Chloride 96 - 112 mEq/L 101 100 101  CO2 19 - 32 mEq/L _0 Calcium 8.4 - 10.5 mg/dL 9.3 9.8 9.7    Risks and benefits of anesthesia discussed at length, patient or surrogate demonstrates understanding. Appropriately NPO. Plan to proceed with anesthesia.  Champ Mungo, MD 10/18/20

## 2020-10-18 NOTE — Anesthesia Postprocedure Evaluation (Addendum)
Anesthesia Post Note  Patient: Wendy Love  Procedure(s) Performed: CATARACT EXTRACTION PHACO AND INTRAOCULAR LENS PLACEMENT (IOC) RIGHT 9.18 01:14.6 12.3% (Right Eye)     Patient location during evaluation: PACU Anesthesia Type: MAC Level of consciousness: awake and alert Pain management: pain level controlled Vital Signs Assessment: post-procedure vital signs reviewed and stable Respiratory status: spontaneous breathing, nonlabored ventilation, respiratory function stable and patient connected to nasal cannula oxygen Cardiovascular status: stable and blood pressure returned to baseline Postop Assessment: no apparent nausea or vomiting Anesthetic complications: no   No complications documented.  Sinda Du

## 2020-10-18 NOTE — Anesthesia Procedure Notes (Signed)
Procedure Name: MAC Date/Time: 10/18/2020 9:50 AM Performed by: Vanetta Shawl, CRNA Pre-anesthesia Checklist: Patient identified, Emergency Drugs available, Suction available, Timeout performed and Patient being monitored Patient Re-evaluated:Patient Re-evaluated prior to induction Oxygen Delivery Method: Nasal cannula Placement Confirmation: positive ETCO2

## 2020-10-18 NOTE — Op Note (Signed)
LOCATION:  [place]   PREOPERATIVE DIAGNOSIS:    Nuclear sclerotic cataract right eye. H25.11   POSTOPERATIVE DIAGNOSIS:  Nuclear sclerotic cataract right eye.     PROCEDURE:  Phacoemusification with posterior chamber intraocular lens placement of the right eye   ULTRASOUND TIME: Procedure(s): CATARACT EXTRACTION PHACO AND INTRAOCULAR LENS PLACEMENT (IOC) RIGHT 9.18 01:14.6 12.3% (Right)  LENS:   Implant Name Type Inv. Item Serial No. Manufacturer Lot No. LRB No. Used Action  LENS IOL TECNIS EYHANCE 21.0 - [id number] Intraocular Lens LENS IOL TECNIS EYHANCE 21.0 [id number]   Right 1 Implanted         SURGEON:  Wyonia Hough, MD   ANESTHESIA:  Topical with tetracaine drops and 2% Xylocaine jelly, augmented with 1% preservative-free intracameral lidocaine.    COMPLICATIONS:  None.   DESCRIPTION OF PROCEDURE:  The patient was identified in the holding room and transported to the operating room and placed in the supine position under the operating microscope.  The right eye was identified as the operative eye and it was prepped and draped in the usual sterile ophthalmic fashion.   A 1 millimeter clear-corneal paracentesis was made at the 12:00 position.  0.5 ml of preservative-free 1% lidocaine was injected into the anterior chamber. The anterior chamber was filled with Viscoat viscoelastic.  A 2.4 millimeter keratome was used to make a near-clear corneal incision at the 9:00 position.  A curvilinear capsulorrhexis was made with a cystotome and capsulorrhexis forceps.  Balanced salt solution was used to hydrodissect and hydrodelineate the nucleus.   Phacoemulsification was then used in stop and chop fashion to remove the lens nucleus and epinucleus.  The remaining cortex was then removed using the irrigation and aspiration handpiece. Provisc was then placed into the capsular bag to distend it for lens placement.  A lens was then injected into the capsular bag.   The remaining viscoelastic was aspirated.   Wounds were hydrated with balanced salt solution.  The anterior chamber was inflated to a physiologic pressure with balanced salt solution.  No wound leaks were noted. Cefuroxime 0.1 ml of a 10mg /ml solution was injected into the anterior chamber for a dose of 1 mg of intracameral antibiotic at the completion of the case.   Timolol and Brimonidine drops were applied to the eye.  The patient was taken to the recovery room in stable condition without complications of anesthesia or surgery.   [name] 10/18/2020, 10:09 AM

## 2020-10-18 NOTE — H&P (Signed)

## 2020-11-13 DIAGNOSIS — Z961 Presence of intraocular lens: Secondary | ICD-10-CM | POA: Diagnosis not present

## 2020-11-28 ENCOUNTER — Telehealth: Payer: Self-pay

## 2020-11-28 NOTE — Telephone Encounter (Signed)
PA for Temazepam has been submitted on [email or web address]. 

## 2020-12-28 ENCOUNTER — Other Ambulatory Visit: Payer: Self-pay | Admitting: Internal Medicine

## 2021-01-10 ENCOUNTER — Other Ambulatory Visit: Payer: Self-pay | Admitting: Internal Medicine

## 2021-01-19 DIAGNOSIS — Z85828 Personal history of other malignant neoplasm of skin: Secondary | ICD-10-CM | POA: Diagnosis not present

## 2021-01-19 DIAGNOSIS — L820 Inflamed seborrheic keratosis: Secondary | ICD-10-CM | POA: Diagnosis not present

## 2021-01-19 DIAGNOSIS — L821 Other seborrheic keratosis: Secondary | ICD-10-CM | POA: Diagnosis not present

## 2021-01-19 DIAGNOSIS — L538 Other specified erythematous conditions: Secondary | ICD-10-CM | POA: Diagnosis not present

## 2021-01-19 DIAGNOSIS — D2262 Melanocytic nevi of left upper limb, including shoulder: Secondary | ICD-10-CM | POA: Diagnosis not present

## 2021-01-19 DIAGNOSIS — D2271 Melanocytic nevi of right lower limb, including hip: Secondary | ICD-10-CM | POA: Diagnosis not present

## 2021-01-19 DIAGNOSIS — Z08 Encounter for follow-up examination after completed treatment for malignant neoplasm: Secondary | ICD-10-CM | POA: Diagnosis not present

## 2021-01-19 DIAGNOSIS — D225 Melanocytic nevi of trunk: Secondary | ICD-10-CM | POA: Diagnosis not present

## 2021-01-19 DIAGNOSIS — D2272 Melanocytic nevi of left lower limb, including hip: Secondary | ICD-10-CM | POA: Diagnosis not present

## 2021-01-19 DIAGNOSIS — D2261 Melanocytic nevi of right upper limb, including shoulder: Secondary | ICD-10-CM | POA: Diagnosis not present

## 2021-02-07 ENCOUNTER — Other Ambulatory Visit: Payer: Self-pay | Admitting: Internal Medicine

## 2021-02-07 DIAGNOSIS — I10 Essential (primary) hypertension: Secondary | ICD-10-CM

## 2021-02-20 ENCOUNTER — Ambulatory Visit (INDEPENDENT_AMBULATORY_CARE_PROVIDER_SITE_OTHER): Payer: Medicare HMO

## 2021-02-20 VITALS — Ht 63.0 in | Wt 133.0 lb

## 2021-02-20 DIAGNOSIS — Z1231 Encounter for screening mammogram for malignant neoplasm of breast: Secondary | ICD-10-CM | POA: Diagnosis not present

## 2021-02-20 DIAGNOSIS — Z780 Asymptomatic menopausal state: Secondary | ICD-10-CM

## 2021-02-20 DIAGNOSIS — Z0000 Encounter for general adult medical examination without abnormal findings: Secondary | ICD-10-CM

## 2021-02-20 NOTE — Patient Instructions (Addendum)
Wendy Love , Thank you for taking time to come for your Medicare Wellness Visit. I appreciate your ongoing commitment to your health goals. Please review the following plan we discussed and let me know if I can assist you in the future.   These are the goals we discussed:  Goals       Patient Stated     Weight (lb) < 134 lb (60.8 kg) (pt-stated)      I'd love to lose about 5lb         This is a list of the screening recommended for you and due dates:  Health Maintenance  Topic Date Due   Zoster (Shingles) Vaccine (1 of 2) 05/23/2021*   Flu Shot  04/09/2021   Tetanus Vaccine  02/26/2023   DEXA scan (bone density measurement)  Completed   Pneumonia vaccines  Completed   HPV Vaccine  Aged Out   COVID-19 Vaccine  Discontinued  *Topic was postponed. The date shown is not the original due date.    Advanced directives: End of life planning; Advance aging; Advanced directives discussed.  Copy of current HCPOA/Living Will requested.    Conditions/risks identified: none new.  Follow up in one year for your annual wellness visit.   Preventive Care 63 Years and Older, Female Preventive care refers to lifestyle choices and visits with your health care provider that can promote health and wellness. What does preventive care include? A yearly physical exam. This is also called an annual well check. Dental exams once or twice a year. Routine eye exams. Ask your health care provider how often you should have your eyes checked. Personal lifestyle choices, including: Daily care of your teeth and gums. Regular physical activity. Eating a healthy diet. Avoiding tobacco and drug use. Limiting alcohol use. Practicing safe sex. Taking low-dose aspirin every day. Taking vitamin and mineral supplements as recommended by your health care provider. What happens during an annual well check? The services and screenings done by your health care provider during your annual well check will  depend on your age, overall health, lifestyle risk factors, and family history of disease. Counseling  Your health care provider may ask you questions about your: Alcohol use. Tobacco use. Drug use. Emotional well-being. Home and relationship well-being. Sexual activity. Eating habits. History of falls. Memory and ability to understand (cognition). Work and work Statistician. Reproductive health. Screening  You may have the following tests or measurements: Height, weight, and BMI. Blood pressure. Lipid and cholesterol levels. These may be checked every 5 years, or more frequently if you are over 81 years old. Skin check. Lung cancer screening. You may have this screening every year starting at age 40 if you have a 30-pack-year history of smoking and currently smoke or have quit within the past 15 years. Fecal occult blood test (FOBT) of the stool. You may have this test every year starting at age 81. Flexible sigmoidoscopy or colonoscopy. You may have a sigmoidoscopy every 5 years or a colonoscopy every 10 years starting at age 81. Hepatitis C blood test. Hepatitis B blood test. Sexually transmitted disease (STD) testing. Diabetes screening. This is done by checking your blood sugar (glucose) after you have not eaten for a while (fasting). You may have this done every 1-3 years. Bone density scan. This is done to screen for osteoporosis. You may have this done starting at age 81. Mammogram. This may be done every 1-2 years. Talk to your health care provider about how often you should  have regular mammograms. Talk with your health care provider about your test results, treatment options, and if necessary, the need for more tests. Vaccines  Your health care provider may recommend certain vaccines, such as: Influenza vaccine. This is recommended every year. Tetanus, diphtheria, and acellular pertussis (Tdap, Td) vaccine. You may need a Td booster every 10 years. Zoster vaccine. You may  need this after age 81. Pneumococcal 13-valent conjugate (PCV13) vaccine. One dose is recommended after age 81. Pneumococcal polysaccharide (PPSV23) vaccine. One dose is recommended after age 81. Talk to your health care provider about which screenings and vaccines you need and how often you need them. This information is not intended to replace advice given to you by your health care provider. Make sure you discuss any questions you have with your health care provider. Document Released: 09/22/2015 Document Revised: 05/15/2016 Document Reviewed: 06/27/2015 Elsevier Interactive Patient Education  2017 [employer] Prevention in the Home Falls can cause injuries. They can happen to people of all ages. There are many things you can do to make your home safe and to help prevent falls. What can I do on the outside of my home? Regularly fix the edges of walkways and driveways and fix any cracks. Remove anything that might make you trip as you walk through a door, such as a raised step or threshold. Trim any bushes or trees on the path to your home. Use bright outdoor lighting. Clear any walking paths of anything that might make someone trip, such as rocks or tools. Regularly check to see if handrails are loose or broken. Make sure that both sides of any steps have handrails. Any raised decks and porches should have guardrails on the edges. Have any leaves, snow, or ice cleared regularly. Use sand or salt on walking paths during winter. Clean up any spills in your garage right away. This includes oil or grease spills. What can I do in the bathroom? Use night lights. Install grab bars by the toilet and in the tub and shower. Do not use towel bars as grab bars. Use non-skid mats or decals in the tub or shower. If you need to sit down in the shower, use a plastic, non-slip stool. Keep the floor dry. Clean up any water that spills on the floor as soon as it happens. Remove soap buildup in  the tub or shower regularly. Attach bath mats securely with double-sided non-slip rug tape. Do not have throw rugs and other things on the floor that can make you trip. What can I do in the bedroom? Use night lights. Make sure that you have a light by your bed that is easy to reach. Do not use any sheets or blankets that are too big for your bed. They should not hang down onto the floor. Have a firm chair that has side arms. You can use this for support while you get dressed. Do not have throw rugs and other things on the floor that can make you trip. What can I do in the kitchen? Clean up any spills right away. Avoid walking on wet floors. Keep items that you use a lot in easy-to-reach places. If you need to reach something above you, use a strong step stool that has a grab bar. Keep electrical cords out of the way. Do not use floor polish or wax that makes floors slippery. If you must use wax, use non-skid floor wax. Do not have throw rugs and other things on the floor  that can make you trip. What can I do with my stairs? Do not leave any items on the stairs. Make sure that there are handrails on both sides of the stairs and use them. Fix handrails that are broken or loose. Make sure that handrails are as long as the stairways. Check any carpeting to make sure that it is firmly attached to the stairs. Fix any carpet that is loose or worn. Avoid having throw rugs at the top or bottom of the stairs. If you do have throw rugs, attach them to the floor with carpet tape. Make sure that you have a light switch at the top of the stairs and the bottom of the stairs. If you do not have them, ask someone to add them for you. What else can I do to help prevent falls? Wear shoes that: Do not have high heels. Have rubber bottoms. Are comfortable and fit you well. Are closed at the toe. Do not wear sandals. If you use a stepladder: Make sure that it is fully opened. Do not climb a closed  stepladder. Make sure that both sides of the stepladder are locked into place. Ask someone to hold it for you, if possible. Clearly mark and make sure that you can see: Any grab bars or handrails. First and last steps. Where the edge of each step is. Use tools that help you move around (mobility aids) if they are needed. These include: Canes. Walkers. Scooters. Crutches. Turn on the lights when you go into a dark area. Replace any light bulbs as soon as they burn out. Set up your furniture so you have a clear path. Avoid moving your furniture around. If any of your floors are uneven, fix them. If there are any pets around you, be aware of where they are. Review your medicines with your doctor. Some medicines can make you feel dizzy. This can increase your chance of falling. Ask your doctor what other things that you can do to help prevent falls. This information is not intended to replace advice given to you by your health care provider. Make sure you discuss any questions you have with your health care provider. Document Released: 06/22/2009 Document Revised: 02/01/2016 Document Reviewed: 09/30/2014 Elsevier Interactive Patient Education  2017 [employer].

## 2021-02-20 NOTE — Progress Notes (Signed)
Subjective:   Wendy Love is a 80 y.o. female who presents for Medicare Annual (Subsequent) preventive examination.  Review of Systems    No ROS.  Medicare Wellness Virtual Visit.  Visual/audio telehealth visit, UTA vital signs.   See social history for additional risk factors.   Cardiac Risk Factors include: advanced age (>72men, >24 women)     Objective:    Today's Vitals   02/20/21 1531  Weight: 133 lb (60.3 kg)  Height: 5\' 3"  (1.6 m)   Body mass index is 23.56 kg/m.  Advanced Directives 02/20/2021 10/18/2020 07/26/2020 02/18/2020 02/17/2019 04/08/2018 04/08/2017  Does Patient Have a Medical Advance Directive? Yes Yes Yes Yes Yes Yes Yes  Type of Paramedic of [place];Living will [place];Living will Living will;Healthcare Power of [place];Living will [place];Living will [place];Living will [place];Living will  Does patient want to make changes to medical advance directive? No - Patient declined No - Patient declined - No - Patient declined No - Patient declined No - Patient declined -  Copy of [place] in Chart? No - copy requested No - copy requested No - copy requested No - copy requested No - copy requested No - copy requested -    Current Medications (verified) Outpatient Encounter Medications as of 02/20/2021  Medication Sig   amLODipine (NORVASC) 5 MG tablet TAKE 1 TABLET BY MOUTH EVERY DAY   aspirin EC 81 MG tablet Take 1 tablet (81 mg total) by mouth every other day. Swallow whole.   b complex vitamins tablet Take 1 tablet by mouth daily.   CALCIUM-MAGNESIUM PO Take by mouth.   Cholecalciferol (VITAMIN D3) 2000 UNITS TABS Take 5,000 Units by mouth daily.   Coenzyme Q10 (CO Q-10) 100 MG CAPS Take by mouth daily.   levothyroxine (SYNTHROID) 75 MCG tablet TAKE 1 TABLET BY MOUTH EVERY DAY   losartan (COZAAR) 50  MG tablet TAKE 2 TABLETS BY MOUTH EVERY DAY   Magnesium 300 MG CAPS Take by mouth daily.   melatonin 1 MG TABS tablet Take 2 mg by mouth at bedtime.   Multiple Vitamin (MULTIVITAMIN) tablet Take 1 tablet by mouth daily.   Omega-3 Fatty Acids (FISH OIL PO) Take by mouth daily.   PROBIOTIC CAPS Take by mouth daily.   temazepam (RESTORIL) 15 MG capsule Take 1 capsule (15 mg total) by mouth at bedtime.   tretinoin (RETIN-A) 0.025 % cream APPLY TO WHOLE FACE EACH NIGHT.   vitamin C (ASCORBIC ACID) 500 MG tablet Take 1,000 mg by mouth daily.    Zinc 25 MG TABS Take 25 mg by mouth daily.   No facility-administered encounter medications on file as of 02/20/2021.    Allergies (verified) Atorvastatin, Brompheniramine-pseudoeph, Codeine, Diphenhydramine hcl, Hctz [hydrochlorothiazide], Loratadine, Tape, Triamterene, and Tramadol hcl   History: Past Medical History:  Diagnosis Date   Anxiety    Bronchitis    Cancer ([place])    squamous cell   COVID-19 09/01/21   home test    Degenerative disc disease, lumbar    Depression    Endometriosis 1974   s/p abdominal surgery, 2nd surgery   High cholesterol    Hypertension    Hypothyroidism    Rhinosinusitis    Sciatica    Past Surgical History:  Procedure Laterality Date   ABDOMINAL HYSTERECTOMY  2000   APPENDECTOMY     CARPAL TUNNEL RELEASE  bilateral   CATARACT EXTRACTION W/PHACO Left 07/26/2020   Procedure: CATARACT EXTRACTION PHACO AND INTRAOCULAR LENS PLACEMENT (IOC) LEFT 6.30 01:10.2 9.0%;  Surgeon: Leandrew Koyanagi, MD;  Location: [place];  Service: Ophthalmology;  Laterality: Left;   CATARACT EXTRACTION W/PHACO Right 10/18/2020   Procedure: CATARACT EXTRACTION PHACO AND INTRAOCULAR LENS PLACEMENT (IOC) RIGHT 9.18 01:14.6 12.3%;  Surgeon: Leandrew Koyanagi, MD;  Location: [place];  Service: Ophthalmology;  Laterality: Right;   Endoscopy/colonoscopy  April 2012   Schatski's Ring , small hiatal hernia    hemilaminectomy  April 2010   L3-L4hemi, L4-L5 microdiskectomy   KNEE ARTHROSCOPY WITH LATERAL MENISECTOMY Right 04/08/2018   Procedure: KNEE ARTHROSCOPY WITH PARTIAL LATERAL MENISECTOMY and debridement.;  Surgeon: Corky Mull, MD;  Location: [place];  Service: Orthopedics;  Laterality: Right;   Family History  Problem Relation Age of Onset   Ovarian cancer Mother    Cancer Mother 21       ovarian cancer, treated by [facility]   Heart disease Father    Breast cancer Neg Hx    Social History   Socioeconomic History   Marital status: Widowed    Spouse name: Not on file   Number of children: Not on file   Years of education: Not on file   Highest education level: Not on file  Occupational History   Not on file  Tobacco Use   Smoking status: Never   Smokeless tobacco: Never   Tobacco comments:    smoked socially in college  Vaping Use   Vaping Use: Never used  Substance and Sexual Activity   Alcohol use: No   Drug use: No   Sexual activity: Not Currently  Other Topics Concern   Not on file  Social History Narrative   Not on file   Social Determinants of Health   Financial Resource Strain: Low Risk    Difficulty of Paying Living Expenses: Not hard at all  Food Insecurity: No Food Insecurity   Worried About Charity fundraiser in the Last Year: Never true   [place] in the Last Year: Never true  Transportation Needs: No Transportation Needs   Lack of Transportation (Medical): No   Lack of Transportation (Non-Medical): No  Physical Activity: Sufficiently Active   Days of Exercise per Week: 5 days   Minutes of Exercise per Session: 30 min  Stress: No Stress Concern Present   Feeling of Stress : Not at all  Social Connections: Unknown   Frequency of Communication with Friends and Family: More than three times a week   Frequency of Social Gatherings with Friends and Family: More than three times a week   Attends Religious Services: Not on file   Active  Member of Clubs or Organizations: Not on file   Attends Archivist Meetings: Not on file   Marital Status: Widowed    Tobacco Counseling Counseling given: Not Answered Tobacco comments: smoked socially in college   Clinical Intake:  Pre-visit preparation completed: Yes        Diabetes: No  How often do you need to have someone help you when you read instructions, pamphlets, or other written materials from your doctor or pharmacy?: 1 - Never   Interpreter Needed?: No      Activities of Daily Living In your present state of health, do you have any difficulty performing the following activities: 02/20/2021  Hearing? N  Vision? N  Difficulty concentrating or making decisions? N  Walking or climbing stairs? N  Dressing or bathing? N  Doing errands, shopping? N  Preparing Food and eating ? N  Using the Toilet? N  In the past six months, have you accidently leaked urine? N  Do you have problems with loss of bowel control? N  Managing your Medications? N  Managing your Finances? N  Housekeeping or managing your Housekeeping? N  Some recent data might be hidden    Patient Care Team: Crecencio Mc, MD as PCP - General (Internal Medicine) Minna Merritts, MD as PCP - Cardiology (Cardiology)  Indicate any recent Medical Services you may have received from other than [facility] providers in the past year (date may be approximate).     Assessment:   This is a routine wellness examination for Vianny.  I connected with Elliott today by telephone and verified that I am speaking with the correct person using two identifiers. Location patient: home Location provider: work Persons participating in the virtual visit: patient, Marine scientist.    I discussed the limitations, risks, security and privacy concerns of performing an evaluation and management service by telephone and the availability of in person appointments. The patient expressed understanding and verbally  consented to this telephonic visit.    Interactive audio and video telecommunications were attempted between this provider and patient, however failed, due to patient having technical difficulties OR patient did not have access to video capability.  We continued and completed visit with audio only.  Some vital signs may be absent or patient reported.   Hearing/Vision screen Hearing Screening - Comments:: Patient is able to hear conversational tones without difficulty.  No issues reported. Vision Screening - Comments:: Followed by [facility] Wears corrective lenses Cataract extraction, bilateral Visual acuity not assessed, virtual visit.  They have seen their ophthalmologist in the last 12 months.    Dietary issues and exercise activities discussed: Current Exercise Habits: Home exercise routine, Intensity: Mild Healthy diet Good water intake   Goals Addressed               This Visit's Progress     Patient Stated     COMPLETED: Follow up with Primary Care Provider (pt-stated)        Spot check and log blood pressure daily, keep log and notify pcp of changes       Weight (lb) < 134 lb (60.8 kg) (pt-stated)   133 lb (60.3 kg)     I'd love to lose about 5lb        Depression Screen PHQ 2/9 Scores 02/20/2021 04/11/2020 02/18/2020 02/17/2019 12/30/2017 12/05/2015 05/30/2014  PHQ - 2 Score 0 0 0 0 0 0 0  PHQ- 9 Score - - - - 1 - -    Fall Risk Fall Risk  02/20/2021 04/11/2020 02/18/2020 09/20/2019 09/06/2019  Falls in the past year? 0 0 0 0 0  Number falls in past yr: 0 - 0 - -  Injury with Fall? 0 - - - -  Risk for fall due to : - - - - -  Follow up Falls evaluation completed Falls evaluation completed Falls evaluation completed Falls evaluation completed Falls evaluation completed    [place]: Handrails in use when climbing stairs? Yes Home free of loose throw rugs in walkways, pet beds, electrical cords, etc? Yes  Adequate lighting  in your home to reduce risk of falls? Yes   ASSISTIVE DEVICES UTILIZED TO PREVENT FALLS: Life alert? No  Use of a cane, walker or w/c? No   TIMED UP AND GO: Was the test performed? No .   Cognitive Function: Patient is alert and oriented x3.  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  MMSE/6CIT deferred. Normal by direct communication/observation.       6CIT Screen 02/17/2019  What Year? 0 points  What month? 0 points  What time? 0 points  Count back from 20 0 points  Months in reverse 0 points  Repeat phrase 0 points  Total Score 0    Immunizations Immunization History  Administered Date(s) Administered   Influenza Split 06/10/2012, 06/09/2013   Influenza Whole 06/06/2009   Pneumococcal Conjugate-13 07/04/2015   Pneumococcal Polysaccharide-23 04/24/2009   Td 06/06/2009, 02/25/2013   Zoster, Live 05/28/2010   Health Maintenance Health Maintenance  Topic Date Due   Zoster Vaccines- Shingrix (1 of 2) 05/23/2021 (Originally [birth year]/24/1990)   INFLUENZA VACCINE  04/09/2021   TETANUS/TDAP  02/26/2023   DEXA SCAN  Completed   PNA vac Low Risk Adult  Completed   HPV VACCINES  Aged Out   COVID-19 Vaccine  Discontinued   Mammogram status: Completed 04/24/20. Repeat every year. Ordered.   Bone Density status: Completed 03/08/20. Results reflect: Bone density results: OSTEOPENIA. Repeat every 2 years. Ordered.   Lung Cancer Screening: (Low Dose CT Chest recommended if Age 67-80 years, 30 pack-year currently smoking OR have quit w/in 15years.) does not qualify.   Hepatitis C Screening: does not qualify.  Vision Screening: Recommended annual ophthalmology exams for early detection of glaucoma and other disorders of the eye. Is the patient up to date with their annual eye exam?  Yes   Dental Screening: Recommended annual dental exams for proper oral hygiene  Community Resource Referral / Chronic Care Management: CRR required this visit?  No   CCM  required this visit?  No      Plan:   Keep all routine maintenance appointments.   I have personally reviewed and noted the following in the patient's chart:   Medical and social history Use of alcohol, tobacco or illicit drugs  Current medications and supplements including opioid prescriptions.  Functional ability and status Nutritional status Physical activity Advanced directives List of other physicians Hospitalizations, surgeries, and ER visits in previous 12 months Vitals Screenings to include cognitive, depression, and falls Referrals and appointments  In addition, I have reviewed and discussed with patient certain preventive protocols, quality metrics, and best practice recommendations. A written personalized care plan for preventive services as well as general preventive health recommendations were provided to patient via mychart.     Varney Biles, LPN   7/67/2094

## 2021-03-23 ENCOUNTER — Telehealth: Payer: Self-pay | Admitting: Internal Medicine

## 2021-03-23 DIAGNOSIS — B9689 Other specified bacterial agents as the cause of diseases classified elsewhere: Secondary | ICD-10-CM | POA: Diagnosis not present

## 2021-03-23 DIAGNOSIS — R040 Epistaxis: Secondary | ICD-10-CM | POA: Diagnosis not present

## 2021-03-23 DIAGNOSIS — J019 Acute sinusitis, unspecified: Secondary | ICD-10-CM | POA: Diagnosis not present

## 2021-03-23 NOTE — Telephone Encounter (Signed)
Called and spoke to Boyce. Koula states that she had experienced her 4th nosebleed and was worried about needing a blood vessel cauterized again. She states that this had happened before and that she did not want to go back to the ER. Spoke with the Doc of the Day, Dr. Biagio Quint, and informed her that the providers available are not comfortable doing a nasal cauterization and that they instruct her to go to the [facility] to have her nose checked. Polly agreed and states that she will go to [place] clinic for an evaluation.

## 2021-03-23 NOTE — Telephone Encounter (Signed)
PT called to advise that she has had 4 noses bleeds in the same spot and last time this happened 4 years ago she had it cauterized. The PT would like to know if they can do this again as she currently got the bleeding to stop and does not want to go to the ER again.

## 2021-03-29 ENCOUNTER — Other Ambulatory Visit: Payer: Self-pay | Admitting: Internal Medicine

## 2021-04-11 ENCOUNTER — Encounter: Payer: Self-pay | Admitting: Internal Medicine

## 2021-04-11 ENCOUNTER — Ambulatory Visit (INDEPENDENT_AMBULATORY_CARE_PROVIDER_SITE_OTHER): Payer: Medicare HMO | Admitting: Internal Medicine

## 2021-04-11 ENCOUNTER — Ambulatory Visit (INDEPENDENT_AMBULATORY_CARE_PROVIDER_SITE_OTHER): Payer: Medicare HMO

## 2021-04-11 ENCOUNTER — Other Ambulatory Visit: Payer: Self-pay

## 2021-04-11 VITALS — BP 146/70 | HR 82 | Temp 98.5°F | Ht 63.0 in | Wt 133.2 lb

## 2021-04-11 DIAGNOSIS — I10 Essential (primary) hypertension: Secondary | ICD-10-CM | POA: Diagnosis not present

## 2021-04-11 DIAGNOSIS — E034 Atrophy of thyroid (acquired): Secondary | ICD-10-CM

## 2021-04-11 DIAGNOSIS — Z0000 Encounter for general adult medical examination without abnormal findings: Secondary | ICD-10-CM

## 2021-04-11 DIAGNOSIS — J9811 Atelectasis: Secondary | ICD-10-CM | POA: Diagnosis not present

## 2021-04-11 DIAGNOSIS — E7800 Pure hypercholesterolemia, unspecified: Secondary | ICD-10-CM | POA: Diagnosis not present

## 2021-04-11 DIAGNOSIS — R61 Generalized hyperhidrosis: Secondary | ICD-10-CM

## 2021-04-11 DIAGNOSIS — N951 Menopausal and female climacteric states: Secondary | ICD-10-CM | POA: Diagnosis not present

## 2021-04-11 DIAGNOSIS — R040 Epistaxis: Secondary | ICD-10-CM | POA: Diagnosis not present

## 2021-04-11 NOTE — Progress Notes (Signed)
Pre visit review using our clinic review tool, if applicable. No additional management support is needed unless otherwise documented below in the visit note. 

## 2021-04-11 NOTE — Patient Instructions (Addendum)
ENT referral In progress for your recurrent nosebleeds  In the meantime,  use Salt water spray daily.  Use afrin  pinch for next episode  Your annual mammogram has been ordered.  You are encouraged (required) to call to make your appointment at [facility]   You do not need DEXA until 2025

## 2021-04-11 NOTE — Progress Notes (Signed)
Patient ID: Wendy Love, female    DOB: August 26, 1941  Age: 80 y.o. MRN: [id number]  The patient is here for  follow up and management of other chronic and acute problems.  This visit occurred during the SARS-CoV-2 public health emergency.  Safety protocols were in place, including screening questions prior to the visit, additional usage of staff PPE, and extensive cleaning of exam room while observing appropriate contact time as indicated for disinfecting solutions.     The risk factors are reflected in the social history.  The roster of all physicians providing medical care to patient - is listed in the Snapshot section of the chart.  Activities of daily living:  The patient is 100% independent in all ADLs: dressing, toileting, feeding as well as independent mobility  Home safety : The patient has smoke detectors in the home. They wear seatbelts.  There are no firearms at home. There is no violence in the home.   There is no risks for hepatitis, STDs or HIV. There is no   history of blood transfusion. They have no travel history to infectious disease endemic areas of the world.  The patient has seen their dentist in the last six month. They have seen their eye doctor in the last year. She denies  hearing difficulty with regard to whispered voices and some television programs.  They have deferred audiologic testing in the last year.  They do not  have excessive sun exposure. Discussed the need for sun protection: hats, long sleeves and use of sunscreen if there is significant sun exposure.   Diet: the importance of a healthy diet is discussed. They do have a healthy diet.  The benefits of regular aerobic exercise were discussed. She walks 4 times per week ,  20 minutes.   Depression screen: there are no signs or vegative symptoms of depression- irritability, change in appetite, anhedonia, sadness/tearfullness.  Cognitive assessment: the patient manages all their financial and personal  affairs and is actively engaged. They could relate day,date,year and events; recalled 2/3 objects at 3 minutes; performed clock-face test normally.  The following portions of the patient's history were reviewed and updated as appropriate: allergies, current medications, past family history, past medical history,  past surgical history, past social history  and problem list.  Visual acuity was not assessed per patient preference since she has regular follow up with her ophthalmologist. Hearing and body mass index were assessed and reviewed.   During the course of the visit the patient was educated and counseled about appropriate screening and preventive services including : fall prevention , diabetes screening, nutrition counseling, colorectal cancer screening, and recommended immunizations.    CC: The primary encounter diagnosis was SYMPTOMATIC MENOPAUSAL/FEMALE CLIMACTERIC STATES. Diagnoses of Frequent epistaxis, Unexplained night sweats, Pure hypercholesterolemia, Hypothyroidism due to acquired atrophy of thyroid, White coat syndrome with hypertension, and Encounter for preventive health examination were also pertinent to this visit.  Cc:  history of epistaxis 4 yrs ago requiring cauterization .  Recurrence on July 5,  has had 7 . Has been unable to see ENT (booked till October) . Treated on July 15 (not during a bleed)  by Urgent  are for sinusitis with mupirocin and cefdinir .  Had another bleed on July 28 and again August 2.  Left side always. Has tried afrin., PRESSURE  2) recurrent sweating . Occurring  time of day or night. For the past several months  No weight loss or cough. Taking quercetin and boswellia for several  months .  Feels that her sweating is abnormal    History Wendy Love has a past medical history of Anxiety, Bronchitis, Cancer ([place]), COVID-19 (09/01/21), Degenerative disc disease, lumbar, Depression, Endometriosis (1974), High cholesterol, Hypertension, Hypothyroidism,  Rhinosinusitis, and Sciatica.   She has a past surgical history that includes Carpal tunnel release; Appendectomy; hemilaminectomy (April 2010); Endoscopy/colonoscopy (April 2012); Abdominal hysterectomy (2000); Knee arthroscopy with lateral menisectomy (Right, 04/08/2018); Cataract extraction w/PHACO (Left, 07/26/2020); and Cataract extraction w/PHACO (Right, 10/18/2020).   Her family history includes Cancer (age of onset: 80) in her mother; Heart disease in her father; Ovarian cancer in her mother.She reports that she has never smoked. She has never used smokeless tobacco. She reports that she does not drink alcohol and does not use drugs.  Outpatient Medications Prior to Visit  Medication Sig Dispense Refill   amLODipine (NORVASC) 5 MG tablet TAKE 1 TABLET BY MOUTH EVERY DAY 90 tablet 1   aspirin EC 81 MG tablet Take 1 tablet (81 mg total) by mouth every other day. Swallow whole. 90 tablet 3   b complex vitamins tablet Take 1 tablet by mouth daily.     Boswellia Serrata (BOSWELLIA PO) Take 250 mg by mouth 2 (two) times daily.     CALCIUM-MAGNESIUM PO Take by mouth.     Cholecalciferol (VITAMIN D3) 2000 UNITS TABS Take 5,000 Units by mouth daily.     Coenzyme Q10 (CO Q-10) 100 MG CAPS Take by mouth daily.     levothyroxine (SYNTHROID) 75 MCG tablet TAKE 1 TABLET BY MOUTH EVERY DAY 90 tablet 0   losartan (COZAAR) 50 MG tablet TAKE 2 TABLETS BY MOUTH EVERY DAY 180 tablet 1   Magnesium 300 MG CAPS Take by mouth daily.     melatonin 1 MG TABS tablet Take 2 mg by mouth at bedtime.     Multiple Vitamin (MULTIVITAMIN) tablet Take 1 tablet by mouth daily.     Omega-3 Fatty Acids (FISH OIL PO) Take by mouth daily.     PROBIOTIC CAPS Take by mouth daily.     QUERCETIN PO Take 560 mg by mouth in the morning and at bedtime.     temazepam (RESTORIL) 15 MG capsule Take 1 capsule (15 mg total) by mouth at bedtime. 30 capsule 5   tretinoin (RETIN-A) 0.025 % cream APPLY TO WHOLE FACE EACH NIGHT.  5   vitamin  C (ASCORBIC ACID) 500 MG tablet Take 1,000 mg by mouth daily.      Zinc 50 MG TABS Take 50 mg by mouth daily.     No facility-administered medications prior to visit.    Review of Systems  Patient denies headache, fevers, malaise, unintentional weight loss, skin rash, eye pain, sinus congestion and sinus pain, sore throat, dysphagia,  hemoptysis , cough, dyspnea, wheezing, chest pain, palpitations, orthopnea, edema, abdominal pain, nausea, melena, diarrhea, constipation, flank pain, dysuria, hematuria, urinary  Frequency, nocturia, numbness, tingling, seizures,  Focal weakness, Loss of consciousness,  Tremor, insomnia, depression, anxiety, and suicidal ideation.     Objective:  BP (!) 146/70   Pulse 82   Temp 98.5 F (36.9 C) (Oral)   Ht '5\' 3"'$  (1.6 m)   Wt 133 lb 3.2 oz (60.4 kg)   SpO2 97%   BMI 23.60 kg/m   Physical Exam  General appearance: alert, cooperative and appears stated age Head: Normocephalic, without obvious abnormality, atraumatic Eyes: conjunctivae/corneas clear. PERRL, EOM's intact. Fundi benign. Ears: normal TM's and external ear canals both ears Nose: Nares normal.  Septum midline. Mucosa normal. No drainage or sinus tenderness. Throat: lips, mucosa, and tongue normal; teeth and gums normal Neck: no adenopathy, no carotid bruit, no JVD, supple, symmetrical, trachea midline and thyroid not enlarged, symmetric, no tenderness/mass/nodules Lungs: clear to auscultation bilaterally Breasts: normal appearance, no masses or tenderness Heart: regular rate and rhythm, S1, S2 normal, no murmur, click, rub or gallop Abdomen: soft, non-tender; bowel sounds normal; no masses,  no organomegaly Extremities: extremities normal, atraumatic, no cyanosis or edema Pulses: 2+ and symmetric Skin: Skin color, texture, turgor normal. No rashes or lesions Neurologic: Alert and oriented X 3, normal strength and tone. Normal symmetric reflexes. Normal coordination and gait.      Assessment & Plan:   Hypothyroidism Thyroid function is WNL on current dose.  No current changes needed.   Lab Results  Component Value Date   TSH 1.17 04/11/2021     White coat syndrome with hypertension Home readings are much lower than in house readings .  She has no proteinuria .  Continue 5 mg amlodipine and 50 mg losartan.   Lab Results  Component Value Date   CREATININE 0.71 04/11/2021   Lab Results  Component Value Date   NA 139 04/11/2021   K 4.0 04/11/2021   CL 101 04/11/2021   CO2 28 04/11/2021   Lab Results  Component Value Date   MICROALBUR <0.7 04/11/2020   MICROALBUR 0.2 11/26/2013       SYMPTOMATIC MENOPAUSAL/FEMALE CLIMACTERIC STATES Reassured that her episodes of frequent sweating unassociated with eating,  Changes in stooling patterns,  weight loss, and  Cough without  or hilar/mediastinal LAD on today's chest x ray and with  a normal cbc/ thyroid function is likely due to menopause  Or heat intolerance.  Lab Results  Component Value Date   WBC 6.9 04/11/2021   HGB 14.1 04/11/2021   HCT 41.2 04/11/2021   MCV 96.9 04/11/2021   PLT 258.0 04/11/2021   Lab Results  Component Value Date   TSH 1.17 04/11/2021      Encounter for preventive health examination age appropriate education and counseling updated, referrals for preventative services and immunizations addressed, dietary and smoking counseling addressed, most recent labs reviewed.  I have personally reviewed and have noted:   1) the patient's medical and social history 2) The pt's use of alcohol, tobacco, and illicit drugs 3) The patient's current medications and supplements 4) Functional ability including ADL's, fall risk, home safety risk, hearing and visual impairment 5) Diet and physical activities 6) Evidence for depression or mood disorder 7) The patient's height, weight, and BMI have been recorded in the chart   I have made referrals, and provided counseling and education  based on review of the above  Frequent epistaxis Etiology likely an irritated blood vessel that needs cauterization .  coags and platelets are normal. viatmin K level is pending   Lab Results  Component Value Date   INR 1.0 04/11/2021   Lab Results  Component Value Date   WBC 6.9 04/11/2021   HGB 14.1 04/11/2021   HCT 41.2 04/11/2021   MCV 96.9 04/11/2021   PLT 258.0 04/11/2021   Updated Medication List Outpatient Encounter Medications as of 04/11/2021  Medication Sig   amLODipine (NORVASC) 5 MG tablet TAKE 1 TABLET BY MOUTH EVERY DAY   aspirin EC 81 MG tablet Take 1 tablet (81 mg total) by mouth every other day. Swallow whole.   b complex vitamins tablet Take 1 tablet by mouth daily.  Boswellia Serrata (BOSWELLIA PO) Take 250 mg by mouth 2 (two) times daily.   CALCIUM-MAGNESIUM PO Take by mouth.   Cholecalciferol (VITAMIN D3) 2000 UNITS TABS Take 5,000 Units by mouth daily.   Coenzyme Q10 (CO Q-10) 100 MG CAPS Take by mouth daily.   levothyroxine (SYNTHROID) 75 MCG tablet TAKE 1 TABLET BY MOUTH EVERY DAY   losartan (COZAAR) 50 MG tablet TAKE 2 TABLETS BY MOUTH EVERY DAY   Magnesium 300 MG CAPS Take by mouth daily.   melatonin 1 MG TABS tablet Take 2 mg by mouth at bedtime.   Multiple Vitamin (MULTIVITAMIN) tablet Take 1 tablet by mouth daily.   Omega-3 Fatty Acids (FISH OIL PO) Take by mouth daily.   PROBIOTIC CAPS Take by mouth daily.   QUERCETIN PO Take 560 mg by mouth in the morning and at bedtime.   temazepam (RESTORIL) 15 MG capsule Take 1 capsule (15 mg total) by mouth at bedtime.   tretinoin (RETIN-A) 0.025 % cream APPLY TO WHOLE FACE EACH NIGHT.   vitamin C (ASCORBIC ACID) 500 MG tablet Take 1,000 mg by mouth daily.    Zinc 50 MG TABS Take 50 mg by mouth daily.   No facility-administered encounter medications on file as of 04/11/2021.     No orders of the defined types were placed in this encounter.   There are no discontinued medications.  Follow-up: No  follow-ups on file.   Crecencio Mc, MD

## 2021-04-12 LAB — CBC WITH DIFFERENTIAL/PLATELET
Basophils Absolute: 0.1 10*3/uL (ref 0.0–0.1)
Basophils Relative: 0.8 % (ref 0.0–3.0)
Eosinophils Absolute: 0.1 10*3/uL (ref 0.0–0.7)
Eosinophils Relative: 1.9 % (ref 0.0–5.0)
HCT: 41.2 % (ref 36.0–46.0)
Hemoglobin: 14.1 g/dL (ref 12.0–15.0)
Lymphocytes Relative: 21.5 % (ref 12.0–46.0)
Lymphs Abs: 1.5 10*3/uL (ref 0.7–4.0)
MCHC: 34.3 g/dL (ref 30.0–36.0)
MCV: 96.9 fl (ref 78.0–100.0)
Monocytes Absolute: 0.6 10*3/uL (ref 0.1–1.0)
Monocytes Relative: 8.3 % (ref 3.0–12.0)
Neutro Abs: 4.7 10*3/uL (ref 1.4–7.7)
Neutrophils Relative %: 67.5 % (ref 43.0–77.0)
Platelets: 258 10*3/uL (ref 150.0–400.0)
RBC: 4.25 Mil/uL (ref 3.87–5.11)
RDW: 12.3 % (ref 11.5–15.5)
WBC: 6.9 10*3/uL (ref 4.0–10.5)

## 2021-04-12 LAB — COMPREHENSIVE METABOLIC PANEL
ALT: 20 U/L (ref 0–35)
AST: 25 U/L (ref 0–37)
Albumin: 4.3 g/dL (ref 3.5–5.2)
Alkaline Phosphatase: 68 U/L (ref 39–117)
BUN: 16 mg/dL (ref 6–23)
CO2: 28 mEq/L (ref 19–32)
Calcium: 9.6 mg/dL (ref 8.4–10.5)
Chloride: 101 mEq/L (ref 96–112)
Creatinine, Ser: 0.71 mg/dL (ref 0.40–1.20)
GFR: 80.04 mL/min (ref >60.00)
Glucose, Bld: 110 mg/dL — ABNORMAL HIGH (ref 70–99)
Potassium: 4 mEq/L (ref 3.5–5.1)
Sodium: 139 mEq/L (ref 135–145)
Total Bilirubin: 0.5 mg/dL (ref 0.2–1.2)
Total Protein: 6.8 g/dL (ref 6.0–8.3)

## 2021-04-12 LAB — TSH: TSH: 1.17 u[IU]/mL (ref 0.35–5.50)

## 2021-04-12 LAB — PROTIME-INR
INR: 1
Prothrombin Time: 10.4 s (ref 9.0–11.5)

## 2021-04-13 ENCOUNTER — Other Ambulatory Visit: Payer: Self-pay | Admitting: Internal Medicine

## 2021-04-13 DIAGNOSIS — R040 Epistaxis: Secondary | ICD-10-CM

## 2021-04-13 DIAGNOSIS — R9389 Abnormal findings on diagnostic imaging of other specified body structures: Secondary | ICD-10-CM

## 2021-04-13 HISTORY — DX: Epistaxis: R04.0

## 2021-04-13 NOTE — Assessment & Plan Note (Signed)

## 2021-04-13 NOTE — Assessment & Plan Note (Signed)
Home readings are much lower than in house readings .  She has no proteinuria .  Continue 5 mg amlodipine and 50 mg losartan.   Lab Results  Component Value Date   CREATININE 0.71 04/11/2021   Lab Results  Component Value Date   NA 139 04/11/2021   K 4.0 04/11/2021   CL 101 04/11/2021   CO2 28 04/11/2021   Lab Results  Component Value Date   MICROALBUR <0.7 04/11/2020   MICROALBUR 0.2 11/26/2013

## 2021-04-13 NOTE — Assessment & Plan Note (Signed)
Thyroid function is WNL on current dose.  No current changes needed.   Lab Results  Component Value Date   TSH 1.17 04/11/2021

## 2021-04-13 NOTE — Assessment & Plan Note (Addendum)
Reassured that her episodes of frequent sweating unassociated with eating,  Changes in stooling patterns,  weight loss, and  Cough without  or hilar/mediastinal LAD on today's chest x ray and with  a normal cbc/ thyroid function is likely due to menopause  Or heat intolerance.  Lab Results  Component Value Date   WBC 6.9 04/11/2021   HGB 14.1 04/11/2021   HCT 41.2 04/11/2021   MCV 96.9 04/11/2021   PLT 258.0 04/11/2021   Lab Results  Component Value Date   TSH 1.17 04/11/2021

## 2021-04-13 NOTE — Assessment & Plan Note (Addendum)
Etiology likely an irritated blood vessel that needs cauterization .  coags and platelets are normal. viatmin K level is pending   Lab Results  Component Value Date   INR 1.0 04/11/2021   Lab Results  Component Value Date   WBC 6.9 04/11/2021   HGB 14.1 04/11/2021   HCT 41.2 04/11/2021   MCV 96.9 04/11/2021   PLT 258.0 04/11/2021

## 2021-04-18 LAB — VITAMIN K1, SERUM: Vitamin K: 1882 pg/mL — ABNORMAL HIGH (ref 130–1500)

## 2021-05-16 DIAGNOSIS — H40003 Preglaucoma, unspecified, bilateral: Secondary | ICD-10-CM | POA: Diagnosis not present

## 2021-05-24 ENCOUNTER — Ambulatory Visit (INDEPENDENT_AMBULATORY_CARE_PROVIDER_SITE_OTHER): Payer: Medicare HMO | Admitting: Otolaryngology

## 2021-05-24 ENCOUNTER — Other Ambulatory Visit: Payer: Self-pay

## 2021-05-24 DIAGNOSIS — R040 Epistaxis: Secondary | ICD-10-CM

## 2021-05-24 NOTE — Progress Notes (Signed)
HPI: Wendy Love is a 80 y.o. female who presents is referred by her PCP for evaluation of recurrent left-sided epistaxis.  She has had several nosebleeds over the past couple months her last nosebleed was about a month ago.  He is always bled from the left side.  She has a small nodule that she can feel and this causes the bleeding.  She had previous cauterization of her nose performed about 4 years ago..  Past Medical History:  Diagnosis Date   Anxiety    Bronchitis    Cancer ([place])    squamous cell   COVID-19 09/01/21   home test    Degenerative disc disease, lumbar    Depression    Endometriosis 1974   s/p abdominal surgery, 2nd surgery   High cholesterol    Hypertension    Hypothyroidism    Rhinosinusitis    Sciatica    Past Surgical History:  Procedure Laterality Date   ABDOMINAL HYSTERECTOMY  2000   APPENDECTOMY     CARPAL TUNNEL RELEASE     bilateral   CATARACT EXTRACTION W/PHACO Left 07/26/2020   Procedure: CATARACT EXTRACTION PHACO AND INTRAOCULAR LENS PLACEMENT (IOC) LEFT 6.30 01:10.2 9.0%;  Surgeon: Leandrew Koyanagi, MD;  Location: [place] [facility];  Service: Ophthalmology;  Laterality: Left;   CATARACT EXTRACTION W/PHACO Right 10/18/2020   Procedure: CATARACT EXTRACTION PHACO AND INTRAOCULAR LENS PLACEMENT (IOC) RIGHT 9.18 01:14.6 12.3%;  Surgeon: Leandrew Koyanagi, MD;  Location: [place];  Service: Ophthalmology;  Laterality: Right;   Endoscopy/colonoscopy  April 2012   Schatski's Ring , small hiatal hernia   hemilaminectomy  April 2010   L3-L4hemi, L4-L5 microdiskectomy   KNEE ARTHROSCOPY WITH LATERAL MENISECTOMY Right 04/08/2018   Procedure: KNEE ARTHROSCOPY WITH PARTIAL LATERAL MENISECTOMY and debridement.;  Surgeon: Corky Mull, MD;  Location: [place];  Service: Orthopedics;  Laterality: Right;   Social History   Socioeconomic History   Marital status: Widowed    Spouse name: Not on file   Number of children: Not on  file   Years of education: Not on file   Highest education level: Not on file  Occupational History   Not on file  Tobacco Use   Smoking status: Never   Smokeless tobacco: Never   Tobacco comments:    smoked socially in college  Vaping Use   Vaping Use: Never used  Substance and Sexual Activity   Alcohol use: No   Drug use: No   Sexual activity: Not Currently  Other Topics Concern   Not on file  Social History Narrative   Not on file   Social Determinants of Health   Financial Resource Strain: Low Risk    Difficulty of Paying Living Expenses: Not hard at all  Food Insecurity: No Food Insecurity   Worried About Charity fundraiser in the Last Year: Never true   [place] in the Last Year: Never true  Transportation Needs: No Transportation Needs   Lack of Transportation (Medical): No   Lack of Transportation (Non-Medical): No  Physical Activity: Sufficiently Active   Days of Exercise per Week: 5 days   Minutes of Exercise per Session: 30 min  Stress: No Stress Concern Present   Feeling of Stress : Not at all  Social Connections: Unknown   Frequency of Communication with Friends and Family: More than three times a week   Frequency of Social Gatherings with Friends and Family: More than three times a week  Attends Religious Services: Not on file   Active Member of Clubs or Organizations: Not on file   Attends Club or Organization Meetings: Not on file   Marital Status: Widowed   Family History  Problem Relation Age of Onset   Ovarian cancer Mother    Cancer Mother 4       ovarian cancer, treated by [facility]   Heart disease Father    Breast cancer Neg Hx    Allergies  Allergen Reactions   Atorvastatin Other (See Comments)    myalgias   Brompheniramine-Pseudoeph     REACTION: agitation, nervous, sleepless   Codeine     REACTION: GI Upset   Diphenhydramine Hcl     REACTION: nervous, sleepless, agitated   Hctz [Hydrochlorothiazide] Other (See Comments)     Severe hyponatremia   Loratadine     REACTION: nervous, sleeplessness   Other Other (See Comments)    Tape left "burn-type" marks on face after Cataract surgery   Tape     Tape left "burn-type" marks on face after Cataract surgery   Triamterene     Hyperkalemia and hyponatremia   Tramadol Hcl Rash    hyperactivity   Prior to Admission medications   Medication Sig Start Date End Date Taking? Authorizing Provider  amLODipine (NORVASC) 5 MG tablet TAKE 1 TABLET BY MOUTH EVERY DAY 02/07/21   Crecencio Mc, MD  aspirin EC 81 MG tablet Take 1 tablet (81 mg total) by mouth every other day. Swallow whole. 08/21/20   Loel Dubonnet, NP  b complex vitamins tablet Take 1 tablet by mouth daily.    [provider]  Boswellia Serrata (BOSWELLIA PO) Take 250 mg by mouth 2 (two) times daily.    [provider]  CALCIUM-MAGNESIUM PO Take by mouth.    [provider]  Cholecalciferol (VITAMIN D3) 2000 UNITS TABS Take 5,000 Units by mouth daily.    [provider]  Coenzyme Q10 (CO Q-10) 100 MG CAPS Take by mouth daily.    [provider]  levothyroxine (SYNTHROID) 75 MCG tablet TAKE 1 TABLET BY MOUTH EVERY DAY 03/29/21   Crecencio Mc, MD  losartan (COZAAR) 50 MG tablet TAKE 2 TABLETS BY MOUTH EVERY DAY 01/10/21   Crecencio Mc, MD  Magnesium 300 MG CAPS Take by mouth daily.    [provider]  melatonin 1 MG TABS tablet Take 2 mg by mouth at bedtime.    [provider]  Multiple Vitamin (MULTIVITAMIN) tablet Take 1 tablet by mouth daily.    [provider]  Omega-3 Fatty Acids (FISH OIL PO) Take by mouth daily.    [provider]  PROBIOTIC CAPS Take by mouth daily.    [provider]  QUERCETIN PO Take 560 mg by mouth in the morning and at bedtime.    [provider]  temazepam (RESTORIL) 15 MG capsule Take 1 capsule (15 mg total) by mouth at bedtime. 10/11/20   Crecencio Mc, MD  tretinoin (RETIN-A)  0.025 % cream APPLY TO WHOLE FACE EACH NIGHT. 06/24/18   [provider]  vitamin C (ASCORBIC ACID) 500 MG tablet Take 1,000 mg by mouth daily.     [provider]  Zinc 50 MG TABS Take 50 mg by mouth daily.    [provider]     Positive ROS: Otherwise negative  All other systems have been reviewed and were otherwise negative with the exception of those mentioned in the HPI  and as above.  Physical Exam: Constitutional: Alert, well-appearing, no acute distress Ears: External ears without lesions or tenderness. Ear canals are clear bilaterally with intact, clear TMs.  Nasal: External nose without lesions. Septum with slight deformity to the right.  On intranasal exam nasal passages are clear bilaterally.  She has a small nodule with some vascularity around the nodule anteriorly along the septum on the left side.  The nodule was removed with cup forceps and bled rather profusely and this was controlled with silver nitrate cauterization..  Oral: Lips and gums without lesions. Tongue and palate mucosa without lesions. Posterior oropharynx clear. Neck: No palpable adenopathy or masses Respiratory: Breathing comfortably  Skin: No facial/neck lesions or rash noted.  Control of epistaxis  Date/Time: 05/24/2021 2:28 PM Performed by: Rozetta Nunnery, MD Authorized by: Rozetta Nunnery, MD   Consent:    Consent obtained:  Verbal   Consent given by:  Patient   Risks discussed:  Bleeding and pain   Alternatives discussed:  No treatment and observation Anesthesia:    Anesthesia method:  None Procedure details:    Treatment site:  L anterior   Treatment method:  Silver nitrate   Treatment complexity:  Limited Post-procedure details:    Procedure completion:  Tolerated well, no immediate complications Comments:     Bleeding in the nostrils from the right anterior septum that was cauterized using silver nitrate.  Assessment: Left-sided  epistaxis.  Plan: This was cauterized using silver nitrate. Reviewed with her concerning using cottonball and Afrin if he has any further nosebleeds. She will follow-up as needed   Radene Journey, MD   CC:

## 2021-05-28 DIAGNOSIS — H40003 Preglaucoma, unspecified, bilateral: Secondary | ICD-10-CM | POA: Diagnosis not present

## 2021-06-18 ENCOUNTER — Telehealth: Payer: Self-pay

## 2021-06-18 DIAGNOSIS — Z1231 Encounter for screening mammogram for malignant neoplasm of breast: Secondary | ICD-10-CM

## 2021-06-18 NOTE — Telephone Encounter (Signed)
Order corrected

## 2021-06-18 NOTE — Telephone Encounter (Signed)
-----   Message from Ashley Jacobs sent at 06/15/2021  4:16 PM EDT ----- Regarding: Mammo order Good evening!  Needing new order for pt mammo IMG 5544 MM 3D SCREEN BREAST BILATERAL. Please and Thank you!

## 2021-06-19 ENCOUNTER — Telehealth: Payer: Self-pay | Admitting: Internal Medicine

## 2021-06-19 NOTE — Telephone Encounter (Signed)
Lft pt vm to call [place] at [phone] to sch mammo. Thanks

## 2021-06-20 DIAGNOSIS — L821 Other seborrheic keratosis: Secondary | ICD-10-CM | POA: Diagnosis not present

## 2021-06-20 DIAGNOSIS — D485 Neoplasm of uncertain behavior of skin: Secondary | ICD-10-CM | POA: Diagnosis not present

## 2021-06-20 DIAGNOSIS — D0472 Carcinoma in situ of skin of left lower limb, including hip: Secondary | ICD-10-CM | POA: Diagnosis not present

## 2021-06-28 ENCOUNTER — Other Ambulatory Visit: Payer: Self-pay | Admitting: Internal Medicine

## 2021-06-28 DIAGNOSIS — D0472 Carcinoma in situ of skin of left lower limb, including hip: Secondary | ICD-10-CM | POA: Diagnosis not present

## 2021-07-03 ENCOUNTER — Telehealth: Payer: Self-pay | Admitting: Internal Medicine

## 2021-07-03 DIAGNOSIS — I10 Essential (primary) hypertension: Secondary | ICD-10-CM

## 2021-07-03 MED ORDER — METOPROLOL TARTRATE 25 MG PO TABS: ORAL_TABLET | ORAL | 1 refills | Status: DC

## 2021-07-03 NOTE — Telephone Encounter (Signed)
Patient came in to office with BP readings and stated BP running high since nurse visit on 06/22/21 patient at that time had a peripheral arterial test, when test was on legs bilateral caused her pain in both legs BP was recorded at 210/? Patient has had intermittent headaches since, per patient had left sided weakness in leg and arm at times since this test. Today in office BP was recorded at 200/118 with pulse of 105 on pulse OX. Patient takes Amlodipine  5 mg daily between 4& 5 PM and losartan 100 mg between 8& 9 Am, head ache described as dull in forehead  area rated at 4 on 0-10 pain scale . Patient was allowed to rest and new vital taken BP 200/100 pulse manual 94. Checked patient hand grip bilateral that were found to be equal on each side, and checked patient leg strength bilateral that was equal in strength, patient able to follow moving objects , smile symmetrical . Advised PCP of finding and order was given to give 25 mg of metoprolol tartrate and for patient to  increase amlodipine to 10  mg daily and order metoprolol 25 mg BID for pressure =/> 160 to monitor patient and advise when BP is going down ,  let patient est at this point for 45 minutes rechecked BP of 200/98 pulse 74, advised PCP and order given to give addition 12.5 mg of metoprolol.given at 4 pm initial dose given at 3:15 pm . BP now at 188/96 pulse 74 reporting to PCP.

## 2021-07-04 NOTE — Telephone Encounter (Signed)
Called to check on patient this morning she BP 158/74 pulse 72 advised patient to follow PCP advise given and to call office with any changes.

## 2021-07-12 ENCOUNTER — Ambulatory Visit (INDEPENDENT_AMBULATORY_CARE_PROVIDER_SITE_OTHER): Payer: Medicare HMO | Admitting: Internal Medicine

## 2021-07-12 ENCOUNTER — Encounter: Payer: Self-pay | Admitting: Internal Medicine

## 2021-07-12 ENCOUNTER — Other Ambulatory Visit: Payer: Self-pay

## 2021-07-12 VITALS — BP 142/90 | HR 80 | Temp 95.3°F | Ht 62.99 in | Wt 134.4 lb

## 2021-07-12 DIAGNOSIS — R6889 Other general symptoms and signs: Secondary | ICD-10-CM

## 2021-07-12 DIAGNOSIS — I701 Atherosclerosis of renal artery: Secondary | ICD-10-CM | POA: Diagnosis not present

## 2021-07-12 DIAGNOSIS — I10 Essential (primary) hypertension: Secondary | ICD-10-CM

## 2021-07-12 DIAGNOSIS — I739 Peripheral vascular disease, unspecified: Secondary | ICD-10-CM

## 2021-07-12 DIAGNOSIS — I150 Renovascular hypertension: Secondary | ICD-10-CM

## 2021-07-12 MED ORDER — CARVEDILOL 3.125 MG PO TABS: 3.1250 mg | ORAL_TABLET | Freq: Two times a day (BID) | ORAL | 3 refills | Status: DC

## 2021-07-12 NOTE — Patient Instructions (Addendum)
Reduce dose of amlodipine to 5 mg daily  Stop metoprolol and start taking carvedilol two times daily  at the lowest dose 3. 125 mg   Continue losartan 100 mg daily  If after 5 days  your readings are not 140/80 or less,  increase the carvedilol to 6. 25  mg    I will make a Referral to Dr. Lucky Cowboy for follow up on the peripheral vascular disease  and repeat renal artery doppler

## 2021-07-12 NOTE — Assessment & Plan Note (Addendum)
Suggested by prior CT ;but not confirmed by  last renal artery doppler on Dec 2019. Referring to Dr Lucky Cowboy for repeat assessment due to worsening control of hypertension now requiring 3 medications

## 2021-07-12 NOTE — Progress Notes (Signed)
Subjective:  Patient ID: Wendy Love, female    DOB: Jan 22, 1941  Age: 80 y.o. MRN: [id number]  CC: The primary encounter diagnosis was Renovascular hypertension. Diagnoses of Essential hypertension, Renal artery stenosis (HCC), Abnormal ankle brachial index (ABI), and Peripheral artery disease ([place]) were also pertinent to this visit.  HPI Safiyah W Noy presents for FOLLOW UP ON HYPERTENSION  This visit occurred during the SARS-CoV-2 public health emergency.  Safety protocols were in place, including screening questions prior to the visit, additional usage of staff PPE, and extensive cleaning of exam room while observing appropriate contact time as indicated for disinfecting solutions.   1) Uncontrolled hypertension:  HOME READINGS REMAIN ELEVATED TO 585 SYSTOLIC .  LOWEST WAS 143 . PULSE  RANGE.  Taking amlodipine 10 mg daily, losartan 50 mg bid,  and prn metoprolol for sbp > 160. PUlse 60 to 80   2) abnormal ABI's  0.87  at [facility] screening . Notes that the left leg aches when BP is elevated.  Does not hurt with walking    Outpatient Medications Prior to Visit  Medication Sig Dispense Refill   amLODipine (NORVASC) 5 MG tablet TAKE 1 TABLET BY MOUTH EVERY DAY 90 tablet 1   aspirin EC 81 MG tablet Take 1 tablet (81 mg total) by mouth every other day. Swallow whole. 90 tablet 3   b complex vitamins tablet Take 1 tablet by mouth daily.     Boswellia Serrata (BOSWELLIA PO) Take 250 mg by mouth 2 (two) times daily.     CALCIUM-MAGNESIUM PO Take by mouth.     Cholecalciferol (VITAMIN D3) 2000 UNITS TABS Take 5,000 Units by mouth daily.     Coenzyme Q10 (CO Q-10) 100 MG CAPS Take by mouth daily.     levothyroxine (SYNTHROID) 75 MCG tablet TAKE 1 TABLET BY MOUTH EVERY DAY 90 tablet 0   losartan (COZAAR) 50 MG tablet TAKE 2 TABLETS BY MOUTH EVERY DAY 180 tablet 1   Magnesium 300 MG CAPS Take by mouth daily.     melatonin 1 MG TABS tablet Take 2 mg by mouth at bedtime.      Multiple Vitamin (MULTIVITAMIN) tablet Take 1 tablet by mouth daily.     Omega-3 Fatty Acids (FISH OIL PO) Take by mouth daily.     PROBIOTIC CAPS Take by mouth daily.     QUERCETIN PO Take 560 mg by mouth in the morning and at bedtime.     temazepam (RESTORIL) 15 MG capsule Take 1 capsule (15 mg total) by mouth at bedtime. 30 capsule 5   tretinoin (RETIN-A) 0.025 % cream APPLY TO WHOLE FACE EACH NIGHT.  5   vitamin C (ASCORBIC ACID) 500 MG tablet Take 1,000 mg by mouth daily.      Zinc 50 MG TABS Take 50 mg by mouth daily.     metoprolol tartrate (LOPRESSOR) 25 MG tablet Take 25 mg twice daily for BP of 277 systolic or higher . 180 tablet 1   No facility-administered medications prior to visit.    Review of Systems;  Patient denies headache, fevers, malaise, unintentional weight loss, skin rash, eye pain, sinus congestion and sinus pain, sore throat, dysphagia,  hemoptysis , cough, dyspnea, wheezing, chest pain, palpitations, orthopnea, edema, abdominal pain, nausea, melena, diarrhea, constipation, flank pain, dysuria, hematuria, urinary  Frequency, nocturia, numbness, tingling, seizures,  Focal weakness, Loss of consciousness,  Tremor, insomnia, depression, anxiety, and suicidal ideation.      Objective:  BP (!) 142/90  Pulse 80   Temp (!) 95.3 F (35.2 C)   Ht 5' 2.99" (1.6 m)   Wt 134 lb 6.4 oz (61 kg)   SpO2 96%   BMI 23.81 kg/m   BP Readings from Last 3 Encounters:  07/12/21 (!) 142/90  04/11/21 (!) 146/70  10/18/20 (!) 178/76    Wt Readings from Last 3 Encounters:  07/12/21 134 lb 6.4 oz (61 kg)  04/11/21 133 lb 3.2 oz (60.4 kg)  02/20/21 133 lb (60.3 kg)    General appearance: alert, cooperative and appears stated age Ears: normal TM's and external ear canals both ears Throat: lips, mucosa, and tongue normal; teeth and gums normal Neck: no adenopathy, no carotid bruit, supple, symmetrical, trachea midline and thyroid not enlarged, symmetric, no  tenderness/mass/nodules Back: symmetric, no curvature. ROM normal. No CVA tenderness. Lungs: clear to auscultation bilaterally Heart: regular rate and rhythm, S1, S2 normal, no murmur, click, rub or gallop Abdomen: soft, non-tender; bowel sounds normal; no masses,  no organomegaly Pulses: 2+ and symmetric Skin: Skin color, texture, turgor normal. No rashes or lesions Lymph nodes: Cervical, supraclavicular, and axillary nodes normal.  Lab Results  Component Value Date   HGBA1C 5.2 09/16/2019   HGBA1C 5.4 02/25/2019   HGBA1C 5.1 08/12/2018    Lab Results  Component Value Date   CREATININE 0.71 04/11/2021   CREATININE 0.59 10/11/2020   CREATININE 0.62 04/11/2020    Lab Results  Component Value Date   WBC 6.9 04/11/2021   HGB 14.1 04/11/2021   HCT 41.2 04/11/2021   PLT 258.0 04/11/2021   GLUCOSE 110 (H) 04/11/2021   CHOL 221 (H) 09/16/2019   TRIG 78.0 09/16/2019   HDL 85.80 09/16/2019   LDLDIRECT 115.0 08/12/2018   LDLCALC 120 (H) 09/16/2019   ALT 20 04/11/2021   AST 25 04/11/2021   NA 139 04/11/2021   K 4.0 04/11/2021   CL 101 04/11/2021   CREATININE 0.71 04/11/2021   BUN 16 04/11/2021   CO2 28 04/11/2021   TSH 1.17 04/11/2021   INR 1.0 04/11/2021   HGBA1C 5.2 09/16/2019   MICROALBUR <0.7 04/11/2020    No results found.  Assessment & Plan:   Problem List Items Addressed This Visit     Peripheral artery disease ([place])    With bilateral ABIs of 0.7 nu [facility] screening.  Referral to Dr Lucky Cowboy for formal evaluatoin       Relevant Medications   carvedilol (COREG) 3.125 MG tablet   Renal artery stenosis ([place])    Suggested by prior CT ;but not confirmed by  last renal artery doppler on Dec 2019. Referring to Dr Lucky Cowboy for repeat assessment due to worsening control of hypertension now requiring 3 medications      Relevant Medications   carvedilol (COREG) 3.125 MG tablet   Other Relevant Orders   Ambulatory referral to Vascular Surgery   Renovascular hypertension -  Primary   Relevant Medications   carvedilol (COREG) 3.125 MG tablet   Other Visit Diagnoses     Essential hypertension       Relevant Medications   carvedilol (COREG) 3.125 MG tablet   Abnormal ankle brachial index (ABI)       Relevant Orders   Ambulatory referral to Vascular Surgery       I provided  30 minutes of  face-to-face time during this encounter reviewing patient's current problems and past imaging studies  recent [facility] screening , providing counseling on the above mentioned problems , and coordination  of  care .   Meds ordered this encounter  Medications   carvedilol (COREG) 3.125 MG tablet    Sig: Take 1 tablet (3.125 mg total) by mouth 2 (two) times daily with a meal.    Dispense:  180 tablet    Refill:  3    Medications Discontinued During This Encounter  Medication Reason   metoprolol tartrate (LOPRESSOR) 25 MG tablet     Follow-up: No follow-ups on file.   Crecencio Mc, MD

## 2021-07-12 NOTE — Assessment & Plan Note (Signed)
With bilateral ABIs of 0.7 nu [facility] screening.  Referral to Dr Lucky Cowboy for formal evaluatoin

## 2021-07-18 ENCOUNTER — Other Ambulatory Visit: Payer: Self-pay | Admitting: Internal Medicine

## 2021-07-20 ENCOUNTER — Other Ambulatory Visit: Payer: Self-pay | Admitting: Internal Medicine

## 2021-07-20 DIAGNOSIS — I150 Renovascular hypertension: Secondary | ICD-10-CM

## 2021-07-20 DIAGNOSIS — I701 Atherosclerosis of renal artery: Secondary | ICD-10-CM

## 2021-07-20 MED ORDER — FUROSEMIDE 20 MG PO TABS: 20.0000 mg | ORAL_TABLET | Freq: Every day | ORAL | 3 refills | Status: DC

## 2021-07-20 NOTE — Telephone Encounter (Signed)
Spoke with pt and she stated that she has been on the Carvedilol for one week and has been on the decreased amount of Amlodipine for about a week as well. Pt stated that since switching medications her blood pressure has been worse and the swelling in her legs and ankles has not gotten any better. Pt wanted to also know if there was a way we could get her vein and vascular appt moved up sooner than 08/10/2021. Pt feels that is too far away with her blood pressure being so high. Pt stated that she is not having any symptoms other than the leg and ankle swelling.

## 2021-07-20 NOTE — Telephone Encounter (Signed)
Pt called in with update on BP. Pt was seen in office on 11/3 and started a new medication called carvedilol. Pt states she can't tell any difference with taking this medication. Pt also states her BP is still rather high. Today was 173/99 yesterday was 174/96. Pt states the lowest it has been the entire week is 151/86. Pt states heart rate has been good and low. Pt says her ankles are swollen every night before bed especially the left ankle.   Pt was also requesting for an earlier appt for the vascular referral. Pt appt was set for Dec 2 and was wanting to be seen sooner due to high BP. Pt was advised to call facility to see if she could be seen sooner.

## 2021-07-20 NOTE — Telephone Encounter (Signed)
I WILL ASK.  pLEASE ASK HER TO INCREASE AMLODIPINE TO 10 MG DAILY  CONTINUE CARVEDILOL AT CURRENT DOSE  FUROSEMDIE 20 MG DAILY AS NEEDED FOR FLUID RETENTION   NEEDS LABS NEXT WEEK (BMET ORDERED)

## 2021-07-20 NOTE — Assessment & Plan Note (Signed)
SUSPECTED BASED ON WORSENING CONTROL. Checking for hyperaldosteronism, hypercortisolism next week.  Advised to increase amlodipine to 10 mg daily, continue losartan 100 mg daily  Carvedilol 3.125 mg bid  And furosemide 20 mg prn edema.  Her appt with vascular is not until dec 23   , will try to get this moved up

## 2021-07-20 NOTE — Telephone Encounter (Signed)
Pt was very hesitant about increasing the amlodipine to 10 mg due to swelling because that is why it was decreased to start with. Pt is also nervous about taking the Furosemide because when she took hctz in the past it bottomed out her sodium. Pt stated that she would increase the amlodipine and call us on Monday to let us know if she has taken the furosemide. Pt did not want to schedule the lab appt yet.

## 2021-08-09 ENCOUNTER — Other Ambulatory Visit: Payer: Self-pay | Admitting: Internal Medicine

## 2021-08-09 DIAGNOSIS — I10 Essential (primary) hypertension: Secondary | ICD-10-CM

## 2021-08-27 ENCOUNTER — Other Ambulatory Visit: Payer: Self-pay | Admitting: Internal Medicine

## 2021-08-31 ENCOUNTER — Encounter (INDEPENDENT_AMBULATORY_CARE_PROVIDER_SITE_OTHER): Payer: Medicare HMO | Admitting: Vascular Surgery

## 2021-08-31 ENCOUNTER — Encounter (INDEPENDENT_AMBULATORY_CARE_PROVIDER_SITE_OTHER): Payer: Medicare HMO

## 2021-09-09 HISTORY — PX: OTHER SURGICAL HISTORY: SHX169

## 2021-09-24 ENCOUNTER — Other Ambulatory Visit: Payer: Self-pay | Admitting: Internal Medicine

## 2021-09-25 ENCOUNTER — Other Ambulatory Visit (INDEPENDENT_AMBULATORY_CARE_PROVIDER_SITE_OTHER): Payer: Self-pay | Admitting: Vascular Surgery

## 2021-09-25 DIAGNOSIS — I739 Peripheral vascular disease, unspecified: Secondary | ICD-10-CM

## 2021-09-25 DIAGNOSIS — I10 Essential (primary) hypertension: Secondary | ICD-10-CM

## 2021-09-26 ENCOUNTER — Ambulatory Visit (INDEPENDENT_AMBULATORY_CARE_PROVIDER_SITE_OTHER): Payer: Medicare HMO

## 2021-09-26 ENCOUNTER — Ambulatory Visit (INDEPENDENT_AMBULATORY_CARE_PROVIDER_SITE_OTHER): Payer: Medicare HMO | Admitting: Nurse Practitioner

## 2021-09-26 ENCOUNTER — Encounter (INDEPENDENT_AMBULATORY_CARE_PROVIDER_SITE_OTHER): Payer: Self-pay | Admitting: Nurse Practitioner

## 2021-09-26 VITALS — BP 225/100 | HR 70 | Resp 15 | Wt 130.0 lb

## 2021-09-26 DIAGNOSIS — I10 Essential (primary) hypertension: Secondary | ICD-10-CM | POA: Diagnosis not present

## 2021-09-26 DIAGNOSIS — I739 Peripheral vascular disease, unspecified: Secondary | ICD-10-CM

## 2021-09-26 DIAGNOSIS — E7800 Pure hypercholesterolemia, unspecified: Secondary | ICD-10-CM | POA: Diagnosis not present

## 2021-09-26 DIAGNOSIS — I701 Atherosclerosis of renal artery: Secondary | ICD-10-CM | POA: Diagnosis not present

## 2021-09-26 NOTE — Progress Notes (Signed)
Subjective:    Patient ID: Wendy Love, female    DOB: [birth year], 81 y.o.   MRN: [id number] Chief Complaint  Patient presents with   Follow-up    Ultrasound follow up    Wendy Love is an 81 year old female that presents today for evaluation for possible renal artery stenosis as well as peripheral arterial disease.  The patient notes that she has had elevated blood pressures for some time and despite being on 3 different medications and excellent management by her primary care provider, her blood pressures remain elevated.  The patient endorses taking her medications as prescribed.  She denies any TIA or strokelike symptoms.  He denies any claudication, rest pain or open wounds or ulcerations.  Today noninvasive studies show 1 to 59% stenosis in the bilateral renal arteries.  Previous CT scan has indicated that there is possible FMD in the right renal artery.  Previously in 2019 there was no evidence of renal artery stenosis.  Today patient has an ABI of 1.09 on the right with a TBI 0.93.  The patient has a left ABI of 1.01 with a TBI 0.81.  The right lower extremity has triphasic/monophasic waveforms with triphasic/biphasic waveforms on the left.  There are normal toe waveforms bilaterally.   Review of Systems  All other systems reviewed and are negative.     Objective:   Physical Exam Vitals reviewed.  HENT:     Head: Normocephalic.  Cardiovascular:     Rate and Rhythm: Normal rate.     Pulses: Normal pulses.  Pulmonary:     Effort: Pulmonary effort is normal.  Skin:    General: Skin is warm and dry.  Neurological:     Mental Status: She is alert and oriented to person, place, and time.  Psychiatric:        Mood and Affect: Mood normal.        Behavior: Behavior normal.        Thought Content: Thought content normal.        Judgment: Judgment normal.    BP (!) 225/100 (BP Location: Left Arm)    Pulse 70    Resp 15    Wt 130 lb (59 kg)    BMI 23.03 kg/m    Past Medical History:  Diagnosis Date   Anxiety    Bronchitis    Cancer ([place])    squamous cell   COVID-19 09/01/21   home test    Degenerative disc disease, lumbar    Depression    Endometriosis 1974   s/p abdominal surgery, 2nd surgery   High cholesterol    Hypertension    Hypothyroidism    Rhinosinusitis    Sciatica     Social History   Socioeconomic History   Marital status: Widowed    Spouse name: Not on file   Number of children: Not on file   Years of education: Not on file   Highest education level: Not on file  Occupational History   Not on file  Tobacco Use   Smoking status: Never   Smokeless tobacco: Never   Tobacco comments:    smoked socially in college  Vaping Use   Vaping Use: Never used  Substance and Sexual Activity   Alcohol use: No   Drug use: No   Sexual activity: Not Currently  Other Topics Concern   Not on file  Social History Narrative   Not on file   Social Determinants of Health   Financial Resource  Strain: Low Risk    Difficulty of Paying Living Expenses: Not hard at all  Food Insecurity: No Food Insecurity   Worried About Charity fundraiser in the Last Year: Never true   Ran Out of Food in the Last Year: Never true  Transportation Needs: No Transportation Needs   Lack of Transportation (Medical): No   Lack of Transportation (Non-Medical): No  Physical Activity: Sufficiently Active   Days of Exercise per Week: 5 days   Minutes of Exercise per Session: 30 min  Stress: No Stress Concern Present   Feeling of Stress : Not at all  Social Connections: Unknown   Frequency of Communication with Friends and Family: More than three times a week   Frequency of Social Gatherings with Friends and Family: More than three times a week   Attends Religious Services: Not on file   Active Member of Clubs or Organizations: Not on file   Attends Archivist Meetings: Not on file   Marital Status: Widowed  Human resources officer Violence:  Not At Risk   Fear of Current or Ex-Partner: No   Emotionally Abused: No   Physically Abused: No   Sexually Abused: No    Past Surgical History:  Procedure Laterality Date   ABDOMINAL HYSTERECTOMY  2000   APPENDECTOMY     CARPAL TUNNEL RELEASE     bilateral   CATARACT EXTRACTION W/PHACO Left 07/26/2020   Procedure: CATARACT EXTRACTION PHACO AND INTRAOCULAR LENS PLACEMENT (IOC) LEFT 6.30 01:10.2 9.0%;  Surgeon: Leandrew Koyanagi, MD;  Location: [facility];  Service: Ophthalmology;  Laterality: Left;   CATARACT EXTRACTION W/PHACO Right 10/18/2020   Procedure: CATARACT EXTRACTION PHACO AND INTRAOCULAR LENS PLACEMENT (IOC) RIGHT 9.18 01:14.6 12.3%;  Surgeon: Leandrew Koyanagi, MD;  Location: [facility];  Service: Ophthalmology;  Laterality: Right;   Endoscopy/colonoscopy  April 2012   Schatski's Ring , small hiatal hernia   hemilaminectomy  April 2010   L3-L4hemi, L4-L5 microdiskectomy   KNEE ARTHROSCOPY WITH LATERAL MENISECTOMY Right 04/08/2018   Procedure: KNEE ARTHROSCOPY WITH PARTIAL LATERAL MENISECTOMY and debridement.;  Surgeon: Corky Mull, MD;  Location: [facility];  Service: Orthopedics;  Laterality: Right;    Family History  Problem Relation Age of Onset   Ovarian cancer Mother    Cancer Mother 42       ovarian cancer, treated by [facility]   Heart disease Father    Breast cancer Neg Hx     Allergies  Allergen Reactions   Atorvastatin Other (See Comments)    myalgias   Brompheniramine-Pseudoeph     REACTION: agitation, nervous, sleepless   Codeine     REACTION: GI Upset   Diphenhydramine Hcl     REACTION: nervous, sleepless, agitated   Hctz [Hydrochlorothiazide] Other (See Comments)    Severe hyponatremia   Loratadine     REACTION: nervous, sleeplessness   Other Other (See Comments)    Tape left "burn-type" marks on face after Cataract surgery   Tape     Tape left "burn-type" marks on face after Cataract surgery   Triamterene      Hyperkalemia and hyponatremia   Tramadol Hcl Rash    hyperactivity    CBC Latest Ref Rng & Units 04/11/2021 09/23/2018 12/30/2017  WBC 4.0 - 10.5 K/uL 6.9 5.1 4.9  Hemoglobin 12.0 - 15.0 g/dL 14.1 14.5 14.8  Hematocrit 36.0 - 46.0 % 41.2 40.5 42.1  Platelets 150.0 - 400.0 K/uL 258.0 224 252.0      CMP  Component Value Date/Time   NA 139 04/11/2021 1424   NA 132 (L) 09/30/2011 1717   K 4.0 04/11/2021 1424   K 3.1 (L) 09/30/2011 1717   CL 101 04/11/2021 1424   CL 91 (L) 09/30/2011 1717   CO2 28 04/11/2021 1424   CO2 26 09/30/2011 1717   GLUCOSE 110 (H) 04/11/2021 1424   GLUCOSE 103 (H) 09/30/2011 1717   BUN 16 04/11/2021 1424   BUN 9 09/30/2011 1717   CREATININE 0.71 04/11/2021 1424   CREATININE 0.74 09/23/2018 0840   CALCIUM 9.6 04/11/2021 1424   CALCIUM 9.4 09/30/2011 1717   PROT 6.8 04/11/2021 1424   PROT 8.2 09/30/2011 1717   ALBUMIN 4.3 04/11/2021 1424   ALBUMIN 4.3 09/30/2011 1717   AST 25 04/11/2021 1424   AST 35 09/30/2011 1717   ALT 20 04/11/2021 1424   ALT 27 09/30/2011 1717   ALKPHOS 68 04/11/2021 1424   ALKPHOS 77 09/30/2011 1717   BILITOT 0.5 04/11/2021 1424   BILITOT 0.4 09/30/2011 1717   GFRNONAA >60 09/30/2011 1717   GFRAA >60 09/30/2011 1717     No results found.     Assessment & Plan:   1. Renal artery stenosis ([place]) Although patient studies today only show 1 to 59% stenosis it is a definite increase in velocities from her previous studies in 2019.  There is also evidence of fibromuscular dysplasia within the right renal artery.  Because FMD can cause increased tortuosity of the vessels which can sometimes cause underestimation's in velocities on ultrasound.  Based on the patient's continued elevated blood pressures, I have suggested a renal artery angiogram to further evaluate for renal artery stenosis.  We discussed the risks the benefits and alternatives.  At this time the patient wishes to wait and consider her options as well as to her  primary care provider.  Patient is advised to contact her office if she wishes to move forward.  Otherwise we will maintain close follow-up and patient return in 6 months for noninvasive studies.  2. Pure hypercholesterolemia Patient should continue with fish oil for lipid management.  3. Hypertension, unspecified type The patient had extremely elevated blood pressures today.  The patient does suffer from whitecoat syndrome which may be contributing to these elevations.   4. PAD (peripheral artery disease) (HCC) Today the patient's ABIs are stable.  Given lack of symptoms and relatively normal numbers, we will have the patient follow-up on an annual basis when she begins to develop symptoms such as claudication, rest pain or open wounds or ulcerations.   Current Outpatient Medications on File Prior to Visit  Medication Sig Dispense Refill   amLODipine (NORVASC) 5 MG tablet TAKE 1 TABLET BY MOUTH EVERY DAY 90 tablet 1   b complex vitamins tablet Take 1 tablet by mouth daily.     CALCIUM-MAGNESIUM PO Take by mouth.     Cholecalciferol (VITAMIN D3) 2000 UNITS TABS Take 5,000 Units by mouth daily.     Coenzyme Q10 (CO Q-10) 100 MG CAPS Take by mouth daily.     levothyroxine (SYNTHROID) 75 MCG tablet TAKE 1 TABLET BY MOUTH EVERY DAY 90 tablet 0   losartan (COZAAR) 50 MG tablet TAKE 2 TABLETS BY MOUTH EVERY DAY 180 tablet 1   Magnesium 300 MG CAPS Take by mouth daily.     melatonin 1 MG TABS tablet Take 2 mg by mouth at bedtime.     metoprolol succinate (TOPROL-XL) 25 MG 24 hr tablet Take 25 mg by  mouth in the morning and at bedtime.     Multiple Vitamin (MULTIVITAMIN) tablet Take 1 tablet by mouth daily.     Omega-3 Fatty Acids (FISH OIL PO) Take by mouth daily.     PROBIOTIC CAPS Take by mouth daily.     QUERCETIN PO Take 560 mg by mouth in the morning and at bedtime.     temazepam (RESTORIL) 15 MG capsule Take 1 capsule (15 mg total) by mouth at bedtime. (Patient taking differently: Take 15  mg by mouth at bedtime as needed.) 30 capsule 5   vitamin C (ASCORBIC ACID) 500 MG tablet Take 1,000 mg by mouth daily.      Zinc 50 MG TABS Take 50 mg by mouth daily.     aspirin EC 81 MG tablet Take 1 tablet (81 mg total) by mouth every other day. Swallow whole. 90 tablet 3   Boswellia Serrata (BOSWELLIA PO) Take 250 mg by mouth 2 (two) times daily.     carvedilol (COREG) 3.125 MG tablet Take 1 tablet (3.125 mg total) by mouth 2 (two) times daily with a meal. (Patient not taking: Reported on 09/26/2021) 180 tablet 3   furosemide (LASIX) 20 MG tablet Take 1 tablet (20 mg total) by mouth daily. 30 tablet 3   tretinoin (RETIN-A) 0.025 % cream APPLY TO WHOLE FACE EACH NIGHT.  5   No current facility-administered medications on file prior to visit.    There are no Patient Instructions on file for this visit. No follow-ups on file.   Kris Hartmann, NP

## 2021-09-26 NOTE — H&P (View-Only) (Signed)
Subjective:    Patient ID: Wendy Love, female    DOB: 1941-06-09, 81 y.o.   MRN: [id number] Chief Complaint  Patient presents with   Follow-up    Ultrasound follow up    Wendy Love is an 81 year old female that presents today for evaluation for possible renal artery stenosis as well as peripheral arterial disease.  The patient notes that she has had elevated blood pressures for some time and despite being on 3 different medications and excellent management by her primary care provider, her blood pressures remain elevated.  The patient endorses taking her medications as prescribed.  She denies any TIA or strokelike symptoms.  He denies any claudication, rest pain or open wounds or ulcerations.  Today noninvasive studies show 1 to 59% stenosis in the bilateral renal arteries.  Previous CT scan has indicated that there is possible FMD in the right renal artery.  Previously in 2019 there was no evidence of renal artery stenosis.  Today patient has an ABI of 1.09 on the right with a TBI 0.93.  The patient has a left ABI of 1.01 with a TBI 0.81.  The right lower extremity has triphasic/monophasic waveforms with triphasic/biphasic waveforms on the left.  There are normal toe waveforms bilaterally.   Review of Systems  All other systems reviewed and are negative.     Objective:   Physical Exam Vitals reviewed.  HENT:     Head: Normocephalic.  Cardiovascular:     Rate and Rhythm: Normal rate.     Pulses: Normal pulses.  Pulmonary:     Effort: Pulmonary effort is normal.  Skin:    General: Skin is warm and dry.  Neurological:     Mental Status: She is alert and oriented to person, place, and time.  Psychiatric:        Mood and Affect: Mood normal.        Behavior: Behavior normal.        Thought Content: Thought content normal.        Judgment: Judgment normal.    BP (!) 225/100 (BP Location: Left Arm)    Pulse 70    Resp 15    Wt 130 lb (59 kg)    BMI 23.03 kg/m    Past Medical History:  Diagnosis Date   Anxiety    Bronchitis    Cancer ([place])    squamous cell   COVID-19 09/01/21   home test    Degenerative disc disease, lumbar    Depression    Endometriosis 1974   s/p abdominal surgery, 2nd surgery   High cholesterol    Hypertension    Hypothyroidism    Rhinosinusitis    Sciatica     Social History   Socioeconomic History   Marital status: Widowed    Spouse name: Not on file   Number of children: Not on file   Years of education: Not on file   Highest education level: Not on file  Occupational History   Not on file  Tobacco Use   Smoking status: Never   Smokeless tobacco: Never   Tobacco comments:    smoked socially in college  Vaping Use   Vaping Use: Never used  Substance and Sexual Activity   Alcohol use: No   Drug use: No   Sexual activity: Not Currently  Other Topics Concern   Not on file  Social History Narrative   Not on file   Social Determinants of Health   Financial Resource  Strain: Low Risk    Difficulty of Paying Living Expenses: Not hard at all  Food Insecurity: No Food Insecurity   Worried About Charity fundraiser in the Last Year: Never true   Ran Out of Food in the Last Year: Never true  Transportation Needs: No Transportation Needs   Lack of Transportation (Medical): No   Lack of Transportation (Non-Medical): No  Physical Activity: Sufficiently Active   Days of Exercise per Week: 5 days   Minutes of Exercise per Session: 30 min  Stress: No Stress Concern Present   Feeling of Stress : Not at all  Social Connections: Unknown   Frequency of Communication with Friends and Family: More than three times a week   Frequency of Social Gatherings with Friends and Family: More than three times a week   Attends Religious Services: Not on file   Active Member of Clubs or Organizations: Not on file   Attends Archivist Meetings: Not on file   Marital Status: Widowed  Human resources officer Violence:  Not At Risk   Fear of Current or Ex-Partner: No   Emotionally Abused: No   Physically Abused: No   Sexually Abused: No    Past Surgical History:  Procedure Laterality Date   ABDOMINAL HYSTERECTOMY  2000   APPENDECTOMY     CARPAL TUNNEL RELEASE     bilateral   CATARACT EXTRACTION W/PHACO Left 07/26/2020   Procedure: CATARACT EXTRACTION PHACO AND INTRAOCULAR LENS PLACEMENT (IOC) LEFT 6.30 01:10.2 9.0%;  Surgeon: Leandrew Koyanagi, MD;  Location: [facility];  Service: Ophthalmology;  Laterality: Left;   CATARACT EXTRACTION W/PHACO Right 10/18/2020   Procedure: CATARACT EXTRACTION PHACO AND INTRAOCULAR LENS PLACEMENT (IOC) RIGHT 9.18 01:14.6 12.3%;  Surgeon: Leandrew Koyanagi, MD;  Location: [facility];  Service: Ophthalmology;  Laterality: Right;   Endoscopy/colonoscopy  April 2012   Schatski's Ring , small hiatal hernia   hemilaminectomy  April 2010   L3-L4hemi, L4-L5 microdiskectomy   KNEE ARTHROSCOPY WITH LATERAL MENISECTOMY Right 04/08/2018   Procedure: KNEE ARTHROSCOPY WITH PARTIAL LATERAL MENISECTOMY and debridement.;  Surgeon: Corky Mull, MD;  Location: [facility];  Service: Orthopedics;  Laterality: Right;    Family History  Problem Relation Age of Onset   Ovarian cancer Mother    Cancer Mother 83       ovarian cancer, treated by [facility]   Heart disease Father    Breast cancer Neg Hx     Allergies  Allergen Reactions   Atorvastatin Other (See Comments)    myalgias   Brompheniramine-Pseudoeph     REACTION: agitation, nervous, sleepless   Codeine     REACTION: GI Upset   Diphenhydramine Hcl     REACTION: nervous, sleepless, agitated   Hctz [Hydrochlorothiazide] Other (See Comments)    Severe hyponatremia   Loratadine     REACTION: nervous, sleeplessness   Other Other (See Comments)    Tape left "burn-type" marks on face after Cataract surgery   Tape     Tape left "burn-type" marks on face after Cataract surgery   Triamterene      Hyperkalemia and hyponatremia   Tramadol Hcl Rash    hyperactivity    CBC Latest Ref Rng & Units 04/11/2021 09/23/2018 12/30/2017  WBC 4.0 - 10.5 K/uL 6.9 5.1 4.9  Hemoglobin 12.0 - 15.0 g/dL 14.1 14.5 14.8  Hematocrit 36.0 - 46.0 % 41.2 40.5 42.1  Platelets 150.0 - 400.0 K/uL 258.0 224 252.0      CMP  Component Value Date/Time   NA 139 04/11/2021 1424   NA 132 (L) 09/30/2011 1717   K 4.0 04/11/2021 1424   K 3.1 (L) 09/30/2011 1717   CL 101 04/11/2021 1424   CL 91 (L) 09/30/2011 1717   CO2 28 04/11/2021 1424   CO2 26 09/30/2011 1717   GLUCOSE 110 (H) 04/11/2021 1424   GLUCOSE 103 (H) 09/30/2011 1717   BUN 16 04/11/2021 1424   BUN 9 09/30/2011 1717   CREATININE 0.71 04/11/2021 1424   CREATININE 0.74 09/23/2018 0840   CALCIUM 9.6 04/11/2021 1424   CALCIUM 9.4 09/30/2011 1717   PROT 6.8 04/11/2021 1424   PROT 8.2 09/30/2011 1717   ALBUMIN 4.3 04/11/2021 1424   ALBUMIN 4.3 09/30/2011 1717   AST 25 04/11/2021 1424   AST 35 09/30/2011 1717   ALT 20 04/11/2021 1424   ALT 27 09/30/2011 1717   ALKPHOS 68 04/11/2021 1424   ALKPHOS 77 09/30/2011 1717   BILITOT 0.5 04/11/2021 1424   BILITOT 0.4 09/30/2011 1717   GFRNONAA >60 09/30/2011 1717   GFRAA >60 09/30/2011 1717     No results found.     Assessment & Plan:   1. Renal artery stenosis ([place]) Although patient studies today only show 1 to 59% stenosis it is a definite increase in velocities from her previous studies in 2019.  There is also evidence of fibromuscular dysplasia within the right renal artery.  Because FMD can cause increased tortuosity of the vessels which can sometimes cause underestimation's in velocities on ultrasound.  Based on the patient's continued elevated blood pressures, I have suggested a renal artery angiogram to further evaluate for renal artery stenosis.  We discussed the risks the benefits and alternatives.  At this time the patient wishes to wait and consider her options as well as to her  primary care provider.  Patient is advised to contact her office if she wishes to move forward.  Otherwise we will maintain close follow-up and patient return in 6 months for noninvasive studies.  2. Pure hypercholesterolemia Patient should continue with fish oil for lipid management.  3. Hypertension, unspecified type The patient had extremely elevated blood pressures today.  The patient does suffer from whitecoat syndrome which may be contributing to these elevations.   4. PAD (peripheral artery disease) (HCC) Today the patient's ABIs are stable.  Given lack of symptoms and relatively normal numbers, we will have the patient follow-up on an annual basis when she begins to develop symptoms such as claudication, rest pain or open wounds or ulcerations.   Current Outpatient Medications on File Prior to Visit  Medication Sig Dispense Refill   amLODipine (NORVASC) 5 MG tablet TAKE 1 TABLET BY MOUTH EVERY DAY 90 tablet 1   b complex vitamins tablet Take 1 tablet by mouth daily.     CALCIUM-MAGNESIUM PO Take by mouth.     Cholecalciferol (VITAMIN D3) 2000 UNITS TABS Take 5,000 Units by mouth daily.     Coenzyme Q10 (CO Q-10) 100 MG CAPS Take by mouth daily.     levothyroxine (SYNTHROID) 75 MCG tablet TAKE 1 TABLET BY MOUTH EVERY DAY 90 tablet 0   losartan (COZAAR) 50 MG tablet TAKE 2 TABLETS BY MOUTH EVERY DAY 180 tablet 1   Magnesium 300 MG CAPS Take by mouth daily.     melatonin 1 MG TABS tablet Take 2 mg by mouth at bedtime.     metoprolol succinate (TOPROL-XL) 25 MG 24 hr tablet Take 25 mg by  mouth in the morning and at bedtime.     Multiple Vitamin (MULTIVITAMIN) tablet Take 1 tablet by mouth daily.     Omega-3 Fatty Acids (FISH OIL PO) Take by mouth daily.     PROBIOTIC CAPS Take by mouth daily.     QUERCETIN PO Take 560 mg by mouth in the morning and at bedtime.     temazepam (RESTORIL) 15 MG capsule Take 1 capsule (15 mg total) by mouth at bedtime. (Patient taking differently: Take 15  mg by mouth at bedtime as needed.) 30 capsule 5   vitamin C (ASCORBIC ACID) 500 MG tablet Take 1,000 mg by mouth daily.      Zinc 50 MG TABS Take 50 mg by mouth daily.     aspirin EC 81 MG tablet Take 1 tablet (81 mg total) by mouth every other day. Swallow whole. 90 tablet 3   Boswellia Serrata (BOSWELLIA PO) Take 250 mg by mouth 2 (two) times daily.     carvedilol (COREG) 3.125 MG tablet Take 1 tablet (3.125 mg total) by mouth 2 (two) times daily with a meal. (Patient not taking: Reported on 09/26/2021) 180 tablet 3   furosemide (LASIX) 20 MG tablet Take 1 tablet (20 mg total) by mouth daily. 30 tablet 3   tretinoin (RETIN-A) 0.025 % cream APPLY TO WHOLE FACE EACH NIGHT.  5   No current facility-administered medications on file prior to visit.    There are no Patient Instructions on file for this visit. No follow-ups on file.   Kris Hartmann, NP

## 2021-09-26 NOTE — Telephone Encounter (Signed)
Refilled: 10/11/2020 Last OV: 07/12/2021 Next OV: 10/16/2021

## 2021-10-16 ENCOUNTER — Encounter: Payer: Self-pay | Admitting: Internal Medicine

## 2021-10-16 ENCOUNTER — Other Ambulatory Visit: Payer: Self-pay

## 2021-10-16 ENCOUNTER — Ambulatory Visit (INDEPENDENT_AMBULATORY_CARE_PROVIDER_SITE_OTHER): Payer: Medicare HMO | Admitting: Internal Medicine

## 2021-10-16 VITALS — BP 180/92 | HR 79 | Temp 98.1°F | Ht 62.25 in | Wt 131.4 lb

## 2021-10-16 DIAGNOSIS — I10 Essential (primary) hypertension: Secondary | ICD-10-CM | POA: Diagnosis not present

## 2021-10-16 DIAGNOSIS — E034 Atrophy of thyroid (acquired): Secondary | ICD-10-CM | POA: Diagnosis not present

## 2021-10-16 DIAGNOSIS — I150 Renovascular hypertension: Secondary | ICD-10-CM | POA: Diagnosis not present

## 2021-10-16 MED ORDER — ALPRAZOLAM 0.25 MG PO TABS: 0.2500 mg | ORAL_TABLET | Freq: Every day | ORAL | 2 refills | Status: DC | PRN

## 2021-10-16 NOTE — Patient Instructions (Signed)
I DO recommend the angiogram of your renal arteries ,  and if something needs to be stented,  Dr Lucky Cowboy can do that.  If he is able to correct a stenosis,  the blood pressure will typically improve in a few months    You can use the alprazolam (1/2 to 1 tablet) if needed for anxiety . It is VERY LOW DOSE

## 2021-10-16 NOTE — Progress Notes (Signed)
Subjective:  Patient ID: Wendy Love, female    DOB: 09/15/1940  Age: 81 y.o. MRN: [id number]  CC: The primary encounter diagnosis was Renovascular hypertension. Diagnoses of Hypothyroidism due to acquired atrophy of thyroid and White coat syndrome with hypertension were also pertinent to this visit.   This visit occurred during the SARS-CoV-2 public health emergency.  Safety protocols were in place, including screening questions prior to the visit, additional usage of staff PPE, and extensive cleaning of exam room while observing appropriate contact time as indicated for disinfecting solutions.    HPI Wendy Love presents for  Chief Complaint  Patient presents with   Follow-up    6 month follow up on hypertension, and would like to discuss [place] results    1) White coat hypertension with hypertension diagnosis:  home readings are quite labile.  She has had renal artery dopplers which suggest 1-59% stenosis bilaterally ,  but with concurrent FMD. The concern is that the dopplers have underestimated her stenosis and she has been recommended to have an angiogram. She has noted increased anxiety that she feels occurs when she measures her blood pressure and she has been unable to bring it down  with meditation.      Outpatient Medications Prior to Visit  Medication Sig Dispense Refill   amLODipine (NORVASC) 5 MG tablet TAKE 1 TABLET BY MOUTH EVERY DAY 90 tablet 1   b complex vitamins tablet Take 1 tablet by mouth daily.     CALCIUM-MAGNESIUM PO Take by mouth.     carvedilol (COREG) 3.125 MG tablet Take 1 tablet (3.125 mg total) by mouth 2 (two) times daily with a meal. (Patient taking differently: Take 3.125 mg by mouth 2 (two) times daily with a meal. Takes 6.25 mg twice daily) 180 tablet 3   Cholecalciferol (VITAMIN D3) 2000 UNITS TABS Take 5,000 Units by mouth daily.     Coenzyme Q10 (CO Q-10) 100 MG CAPS Take by mouth daily.     furosemide (LASIX) 20 MG tablet Take 1  tablet (20 mg total) by mouth daily. 30 tablet 3   levothyroxine (SYNTHROID) 75 MCG tablet TAKE 1 TABLET BY MOUTH EVERY DAY 90 tablet 0   losartan (COZAAR) 50 MG tablet TAKE 2 TABLETS BY MOUTH EVERY DAY 180 tablet 1   Magnesium 300 MG CAPS Take by mouth daily.     melatonin 1 MG TABS tablet Take 2 mg by mouth at bedtime.     Multiple Vitamin (MULTIVITAMIN) tablet Take 1 tablet by mouth daily.     Omega-3 Fatty Acids (FISH OIL PO) Take by mouth daily.     PROBIOTIC CAPS Take by mouth daily.     QUERCETIN PO Take 560 mg by mouth in the morning and at bedtime.     temazepam (RESTORIL) 15 MG capsule TAKE 1 CAPSULE BY MOUTH NIGHTLY 30 capsule 0   tretinoin (RETIN-A) 0.025 % cream APPLY TO WHOLE FACE EACH NIGHT.  5   vitamin C (ASCORBIC ACID) 500 MG tablet Take 1,000 mg by mouth daily.      Zinc 50 MG TABS Take 50 mg by mouth daily.     aspirin EC 81 MG tablet Take 1 tablet (81 mg total) by mouth every other day. Swallow whole. 90 tablet 3   Boswellia Serrata (BOSWELLIA PO) Take 250 mg by mouth 2 (two) times daily.     metoprolol succinate (TOPROL-XL) 25 MG 24 hr tablet Take 25 mg by mouth in the morning and  at bedtime. (Patient not taking: Reported on 10/16/2021)     No facility-administered medications prior to visit.    Review of Systems;  Patient denies headache, fevers, malaise, unintentional weight loss, skin rash, eye pain, sinus congestion and sinus pain, sore throat, dysphagia,  hemoptysis , cough, dyspnea, wheezing, chest pain, palpitations, orthopnea, edema, abdominal pain, nausea, melena, diarrhea, constipation, flank pain, dysuria, hematuria, urinary  Frequency, nocturia, numbness, tingling, seizures,  Focal weakness, Loss of consciousness,  Tremor, insomnia, depression, anxiety, and suicidal ideation.      Objective:  BP (!) 180/92 (BP Location: Left Arm, Patient Position: Sitting, Cuff Size: Normal)    Pulse 79    Temp 98.1 F (36.7 C) (Oral)    Ht 5' 2.25" (1.581 m)    Wt 131 lb  6.4 oz (59.6 kg)    SpO2 99%    BMI 23.84 kg/m   BP Readings from Last 3 Encounters:  10/16/21 (!) 180/92  09/26/21 (!) 225/100  07/12/21 (!) 142/90    Wt Readings from Last 3 Encounters:  10/16/21 131 lb 6.4 oz (59.6 kg)  09/26/21 130 lb (59 kg)  07/12/21 134 lb 6.4 oz (61 kg)    General appearance: alert, cooperative and appears stated age Ears: normal TM's and external ear canals both ears Throat: lips, mucosa, and tongue normal; teeth and gums normal Neck: no adenopathy, no carotid bruit, supple, symmetrical, trachea midline and thyroid not enlarged, symmetric, no tenderness/mass/nodules Back: symmetric, no curvature. ROM normal. No CVA tenderness. Lungs: clear to auscultation bilaterally Heart: regular rate and rhythm, S1, S2 normal, no murmur, click, rub or gallop Abdomen: soft, non-tender; bowel sounds normal; no masses,  no organomegaly Pulses: 2+ and symmetric Skin: Skin color, texture, turgor normal. No rashes or lesions Lymph nodes: Cervical, supraclavicular, and axillary nodes normal.  Lab Results  Component Value Date   HGBA1C 5.2 09/16/2019   HGBA1C 5.4 02/25/2019   HGBA1C 5.1 08/12/2018    Lab Results  Component Value Date   CREATININE 0.71 04/11/2021   CREATININE 0.59 10/11/2020   CREATININE 0.62 04/11/2020    Lab Results  Component Value Date   WBC 6.9 04/11/2021   HGB 14.1 04/11/2021   HCT 41.2 04/11/2021   PLT 258.0 04/11/2021   GLUCOSE 110 (H) 04/11/2021   CHOL 221 (H) 09/16/2019   TRIG 78.0 09/16/2019   HDL 85.80 09/16/2019   LDLDIRECT 115.0 08/12/2018   LDLCALC 120 (H) 09/16/2019   ALT 20 04/11/2021   AST 25 04/11/2021   NA 139 04/11/2021   K 4.0 04/11/2021   CL 101 04/11/2021   CREATININE 0.71 04/11/2021   BUN 16 04/11/2021   CO2 28 04/11/2021   TSH 1.17 04/11/2021   INR 1.0 04/11/2021   HGBA1C 5.2 09/16/2019   MICROALBUR <0.7 04/11/2020    No results found.  Assessment & Plan:   Problem List Items Addressed This Visit      Hypothyroidism   Relevant Orders   TSH   White coat syndrome with hypertension    Uncontrolled , aggravated by renal artery stenosis and FMD, both suggested by noninvasive imaging.  Accurate quantification needed with angiogram and possible stenting       Other Visit Diagnoses     Renovascular hypertension    -  Primary   Relevant Orders   Basic metabolic panel   Aldosterone + renin activity w/ ratio   Cortisol       I spent 30 minutes dedicated to the care of this patient  on the date of this encounter to include pre-visit review of patient's medical history,  most recent imaging studies, Face-to-face time with the patient , and post visit ordering of testing and therapeutics.    Follow-up: Return in about 6 months (around 04/15/2022).   Crecencio Mc, MD

## 2021-10-16 NOTE — Assessment & Plan Note (Signed)
Uncontrolled , aggravated by renal artery stenosis and FMD, both suggested by noninvasive imaging.  Accurate quantification needed with angiogram and possible stenting

## 2021-10-17 LAB — BASIC METABOLIC PANEL
BUN: 15 mg/dL (ref 6–23)
CO2: 25 mEq/L (ref 19–32)
Calcium: 9.8 mg/dL (ref 8.4–10.5)
Chloride: 102 mEq/L (ref 96–112)
Creatinine, Ser: 0.67 mg/dL (ref 0.40–1.20)
GFR: 81.98 mL/min (ref >60.00)
Glucose, Bld: 102 mg/dL — ABNORMAL HIGH (ref 70–99)
Potassium: 3.5 mEq/L (ref 3.5–5.1)
Sodium: 142 mEq/L (ref 135–145)

## 2021-10-17 LAB — TSH: TSH: 0.67 u[IU]/mL (ref 0.35–5.50)

## 2021-10-17 LAB — CORTISOL: Cortisol, Plasma: 6.4 ug/dL

## 2021-10-19 ENCOUNTER — Telehealth: Payer: Self-pay

## 2021-10-19 NOTE — Telephone Encounter (Signed)
Pt called and stated that she has not heard from Dr. Bunnie Domino office about the renal angiogram that was talked about during her visit on Tuesday. I advised pt that we typically give their office a week to contact her before we reach out to them. Pt stated that Dr. Derrel Nip advised her that she would reach out to Dr. Lucky Cowboy about this.

## 2021-10-22 ENCOUNTER — Encounter: Payer: Self-pay | Admitting: Internal Medicine

## 2021-10-22 LAB — ALDOSTERONE + RENIN ACTIVITY W/ RATIO
Aldosterone: <1 ng/dL
Renin Activity: 0.09 ng/mL/h — ABNORMAL LOW (ref 0.25–5.82)

## 2021-10-22 NOTE — Telephone Encounter (Signed)
Pt is aware and was advised that if she does not hear from their office by the middle of this week to please give [place] a call. Pt gave a verbal understanding.

## 2021-10-23 ENCOUNTER — Telehealth (INDEPENDENT_AMBULATORY_CARE_PROVIDER_SITE_OTHER): Payer: Self-pay

## 2021-10-23 NOTE — Telephone Encounter (Signed)
Spoke with the patient and she is scheduled with Dr. Lucky Cowboy for a renal artery stenosis on 10/25/21 with a 6:45 am. Pre-procedure instructions were discussed and per the patient it was written down.

## 2021-10-24 ENCOUNTER — Other Ambulatory Visit (INDEPENDENT_AMBULATORY_CARE_PROVIDER_SITE_OTHER): Payer: Self-pay | Admitting: Nurse Practitioner

## 2021-10-24 DIAGNOSIS — I701 Atherosclerosis of renal artery: Secondary | ICD-10-CM

## 2021-10-25 ENCOUNTER — Encounter
Admission: RE | Disposition: A | Discharge status: Home / Self Care | Payer: Self-pay | Source: Home / Self Care | Attending: Vascular Surgery

## 2021-10-25 ENCOUNTER — Telehealth: Payer: Self-pay | Admitting: Internal Medicine

## 2021-10-25 ENCOUNTER — Ambulatory Visit
Admission: RE | Admit: 2021-10-25 | Discharge: 2021-10-25 | Disposition: A | Discharge status: Home / Self Care | Payer: Medicare HMO | Attending: Vascular Surgery | Admitting: Vascular Surgery

## 2021-10-25 DIAGNOSIS — E7800 Pure hypercholesterolemia, unspecified: Secondary | ICD-10-CM | POA: Diagnosis not present

## 2021-10-25 DIAGNOSIS — I701 Atherosclerosis of renal artery: Secondary | ICD-10-CM | POA: Insufficient documentation

## 2021-10-25 DIAGNOSIS — I150 Renovascular hypertension: Secondary | ICD-10-CM | POA: Diagnosis not present

## 2021-10-25 DIAGNOSIS — I739 Peripheral vascular disease, unspecified: Secondary | ICD-10-CM | POA: Diagnosis not present

## 2021-10-25 HISTORY — PX: RENAL ANGIOGRAPHY: CATH118260

## 2021-10-25 LAB — CREATININE, SERUM
Creatinine, Ser: 0.54 mg/dL (ref 0.44–1.00)
GFR, Estimated: >60 mL/min (ref >60)

## 2021-10-25 LAB — BUN: BUN: 14 mg/dL (ref 8–23)

## 2021-10-25 SURGERY — RENAL ANGIOGRAPHY
Anesthesia: Moderate Sedation

## 2021-10-25 MED ORDER — FENTANYL CITRATE (PF) 100 MCG/2ML IJ SOLN
INTRAMUSCULAR | Status: AC
  Filled 2021-10-25: qty 2

## 2021-10-25 MED ORDER — MIDAZOLAM HCL 2 MG/2ML IJ SOLN
INTRAMUSCULAR | Status: DC | PRN
  Administered 2021-10-25: 1 mg via INTRAVENOUS

## 2021-10-25 MED ORDER — FAMOTIDINE 20 MG PO TABS: 40.0000 mg | ORAL_TABLET | Freq: Once | ORAL | Status: DC | PRN

## 2021-10-25 MED ORDER — ONDANSETRON HCL 4 MG/2ML IJ SOLN
INTRAMUSCULAR | Status: AC
  Filled 2021-10-25: qty 2

## 2021-10-25 MED ORDER — ASPIRIN EC 81 MG PO TBEC: 81.0000 mg | DELAYED_RELEASE_TABLET | Freq: Every day | ORAL | 2 refills | Status: DC

## 2021-10-25 MED ORDER — ONDANSETRON HCL 4 MG/2ML IJ SOLN
4.0000 mg | Freq: Four times a day (QID) | INTRAMUSCULAR | Status: DC | PRN
Start: 2021-10-25 — End: 2021-10-25
  Administered 2021-10-25: 4 mg via INTRAVENOUS

## 2021-10-25 MED ORDER — CEFAZOLIN SODIUM-DEXTROSE 2-4 GM/100ML-% IV SOLN: 2.0000 g | Freq: Once | INTRAVENOUS | Status: AC

## 2021-10-25 MED ORDER — CLOPIDOGREL BISULFATE 75 MG PO TABS: 75.0000 mg | ORAL_TABLET | Freq: Every day | ORAL | 11 refills | Status: DC

## 2021-10-25 MED ORDER — FENTANYL CITRATE (PF) 100 MCG/2ML IJ SOLN
INTRAMUSCULAR | Status: DC | PRN
  Administered 2021-10-25: 50 ug via INTRAVENOUS

## 2021-10-25 MED ORDER — HEPARIN SODIUM (PORCINE) 1000 UNIT/ML IJ SOLN
INTRAMUSCULAR | Status: DC | PRN
  Administered 2021-10-25: 4000 [IU] via INTRAVENOUS

## 2021-10-25 MED ORDER — HYDRALAZINE HCL 20 MG/ML IJ SOLN
INTRAMUSCULAR | Status: AC
  Filled 2021-10-25: qty 1

## 2021-10-25 MED ORDER — HYDROMORPHONE HCL 1 MG/ML IJ SOLN: 1.0000 mg | Freq: Once | INTRAMUSCULAR | Status: DC | PRN

## 2021-10-25 MED ORDER — SODIUM CHLORIDE 0.9 % IV SOLN: INTRAVENOUS | Status: DC

## 2021-10-25 MED ORDER — HEPARIN SODIUM (PORCINE) 1000 UNIT/ML IJ SOLN
INTRAMUSCULAR | Status: AC
  Filled 2021-10-25: qty 10

## 2021-10-25 MED ORDER — CEFAZOLIN SODIUM-DEXTROSE 2-4 GM/100ML-% IV SOLN
INTRAVENOUS | Status: AC
  Administered 2021-10-25: 2 g via INTRAVENOUS
  Filled 2021-10-25: qty 100

## 2021-10-25 MED ORDER — MIDAZOLAM HCL 2 MG/ML PO SYRP
8.0000 mg | ORAL_SOLUTION | Freq: Once | ORAL | Status: DC | PRN
Start: 2021-10-25 — End: 2021-10-25

## 2021-10-25 MED ORDER — METHYLPREDNISOLONE SODIUM SUCC 125 MG IJ SOLR: 125.0000 mg | Freq: Once | INTRAMUSCULAR | Status: DC | PRN

## 2021-10-25 MED ORDER — SODIUM CHLORIDE 0.9 % IV SOLN
Freq: Once | INTRAVENOUS | Status: DC
Start: 2021-10-25 — End: 2021-10-25

## 2021-10-25 MED ORDER — MIDAZOLAM HCL 2 MG/2ML IJ SOLN
INTRAMUSCULAR | Status: AC
  Filled 2021-10-25: qty 2

## 2021-10-25 MED ORDER — HYDRALAZINE HCL 20 MG/ML IJ SOLN
INTRAMUSCULAR | Status: DC | PRN
  Administered 2021-10-25 (×2): 10 mg via INTRAVENOUS

## 2021-10-25 MED ORDER — DIPHENHYDRAMINE HCL 50 MG/ML IJ SOLN: 50.0000 mg | Freq: Once | INTRAMUSCULAR | Status: DC | PRN

## 2021-10-25 SURGICAL SUPPLY — 17 items
BALLN LUTONIX DCB 5X40X130 (BALLOONS) ×2
BALLOON LUTONIX DCB 5X40X130 (BALLOONS) IMPLANT
CATH ANGIO 5F PIGTAIL 65CM (CATHETERS) ×1 IMPLANT
CATH C2 65CM (CATHETERS) ×1 IMPLANT
COVER PROBE U/S 5X48 (MISCELLANEOUS) ×1 IMPLANT
DEVICE SAFEGUARD 24CM (GAUZE/BANDAGES/DRESSINGS) ×1 IMPLANT
DEVICE STARCLOSE SE CLOSURE (Vascular Products) ×1 IMPLANT
DEVICE TORQUE (MISCELLANEOUS) ×1 IMPLANT
GLIDEWIRE STIFF .35X180X3 HYDR (WIRE) ×1 IMPLANT
KIT ENCORE 26 ADVANTAGE (KITS) ×1 IMPLANT
PACK ANGIOGRAPHY (CUSTOM PROCEDURE TRAY) ×3 IMPLANT
SHEATH ANL2 6FRX45 HC (SHEATH) ×1 IMPLANT
SHEATH BRITE TIP 5FRX11 (SHEATH) ×1 IMPLANT
SYR MEDRAD MARK 7 150ML (SYRINGE) ×1 IMPLANT
TUBING CONTRAST HIGH PRESS 72 (TUBING) ×1 IMPLANT
WIRE GUIDERIGHT .035X150 (WIRE) ×1 IMPLANT
WIRE MAGIC TORQUE 315CM (WIRE) ×1 IMPLANT

## 2021-10-25 NOTE — Telephone Encounter (Signed)
Pt stated that did take both the losartan 100 mg and the Carvedilol 3.25 mg at 5:30 this AM since she had to be at the hospital at 6.30 am. Pt stated that since being home her bp was 129/66, then checked it again 45 minutes later and it was 151/66 pulse 77, pt stated that she was talking during this reading. She is feeling better but does still have a slight HA and is tired. She also stated that she isn't sure if she is having palpitations or if its just her heart beat she is feeling more prominent right now. Pt stated that right now is the first time that she has laid down since procedure.

## 2021-10-25 NOTE — Progress Notes (Signed)
Dr. Lucky Cowboy by & spoke with pt. & her daughter. VSS. OK for DC home now after "vagal" episode per MD. Stable for DC home now., Pt. Ambulated to BR again & VSS after return to room.

## 2021-10-25 NOTE — Telephone Encounter (Signed)
Spoke with pt and informed her of the message below. Pt gave a verbal understanding and stated that she would give [place] a call in the morning.

## 2021-10-25 NOTE — Telephone Encounter (Signed)
Did she take her blood pressure medication this am?  Has she checked her blood pressure since being home?  Feeling better?  Any other symptoms?  Need a little more information and then can advise how to take medication

## 2021-10-25 NOTE — Telephone Encounter (Signed)
Patient had a renal angiography today. Bp is lower and would like to know about taking her 3 BP medications. Patient was schedule appointment with Dr Derrel Nip on 11/01/21. Patient really doesn't want to wait that long she needs to know now. She said that her bp is too low to take any medication and is not going to take it today.

## 2021-10-25 NOTE — Progress Notes (Signed)
NS bolus 250 ml completed now. BP 137/62, HR 72, RR 20. Pt. States " I feel a lot better now." Denies any nausea or dizziness at present.

## 2021-10-25 NOTE — Telephone Encounter (Signed)
After resting, have her check her blood pressure again.  If continuing to increase, can take her pm dose of amlodipine.   Have her check her pressure in the am. Can call in reading.  Should be able to resume her previous medications, but can clarify.  If chest pain, increased palpitations, sob, etc - (acute changes) - she needs to be seen.  Please forward to Dr Derrel Nip for [facility].

## 2021-10-25 NOTE — Telephone Encounter (Signed)
Spoke with pt and she stated that when she was getting ready to leave from having the renal angiogram today when she stood up her bp dropped to 74/?, pt could not remember bottom number. Pt was told that she had a vasovagal episode when her bp dropped. They immediately got pt on IV fluids, giving her one bag full. Pt is wanting to know if she should take any of her bp medications today or if she should even go back on them at all right now. Pt takes losartan 100 mg and Carvedilol 3.25 mg in the AM and then takes Amlodipine 5 mg in the PM.

## 2021-10-25 NOTE — Op Note (Signed)
[facility]  Percutaneous Study/Intervention Procedural Note    Surgeon(s): [employer]   Assistants: None  Pre-operative Diagnosis: FMD, right renal artery stenosis, renovascular hypertension  Post-operative diagnosis:  Same  Procedure(s) Performed:             1.  Ultrasound guidance for vascular access right femoral artery             2.  Catheter placement into bilateral renal arteries from right femoral approach             3.  Aortogram and selective bilateral renal angiograms             4.  Percutaneous transluminal angioplasty of the right renal artery with 5 mm diameter by 4 cm length Lutonix drug-coated angioplasty balloon             5.  StarClose closure device right femoral artery              Contrast: 40 cc  EBL: 5 cc   Fluoro Time: 4 minutes  Moderate conscious sedation: Approximately 24 minutes with 1 mg of Versed and 50 mcg of Fentanyl  Indications:  The patient is a 81 yo WF with worsening severe hypertension despite multiple medications. The patient has suboptimal blood pressure control despite multiple antihypertensives and a noninvasive study demonstrating FMD creating renal artery stenosis. Given the clinical scenario and the noninvasive findings, angiogram is indicated for further evaluation of her renal artery and potential treatment. Risks and benefits are discussed and informed consent is obtained.  Procedure:  The patient was identified and appropriate procedural time out was performed.  The patient was then placed supine on the table and prepped and draped in the usual sterile fashion. Moderate conscious sedation was administered with a face to face encounter with the patient throughout the procedure with my supervision of the RN administering medicines and monitoring the patients vital signs and mental status throughout from the start of the procedure until the patient was taken to the recovery room  Ultrasound was used to evaluate  the right common femoral artery.  It was patent .  A digital ultrasound image was acquired.  A Seldinger needle was used to access the right common femoral artery under direct ultrasound guidance and a permanent image was performed.  A 0.035 J wire was advanced without resistance and a 5Fr sheath was placed.  Pigtail catheter was placed into the aorta at the L1 level and an AP aortogram was performed. This demonstrated marked tortuosity of the aorta and iliac arteries without significant stenosis.  The left renal artery appeared to have some mild disease proximally but no obvious fibromuscular dysplasia.  The right renal artery was markedly tortuous with what appeared to be significant fibromuscular dysplasia. The patient was then systemically heparinized with 4000 units of intravenous heparin. I used a C2 catheter to cannulate the left renal artery and selective imaging was performed.  Very mild disease in the 20% range was identified at the origin of the left renal artery without significant FMD or hemodynamically significant stenosis.  I then used a C2 catheter to selectively cannulate the right renal artery and perform selective imaging.  This confirmed a significant stenosis of the right renal artery secondary to marked fibromuscular dysplasia throughout the main right renal artery.  There was an origin stenosis in the 50% range and marked irregularity throughout the main right renal artery.  At this point I selected the glide wire and  crossed the lesion without difficulty.  I then advanced a C2 catheter and exchanged for a Magic torque wire and then placed a 6 [place] Ansell sheath into the right renal artery I then selected a 5 mm diameter by 4 cm length Lutonix drug-coated angioplasty balloon to treat the entire main right renal artery out to the primary bifurcation and back to the origin of the right renal artery.  This was inflated to 8 atm for 1 minute.  Completion angiogram showed significant improvement  in the fibromuscular dysplasia with what appeared to be less than 20% residual stenosis.  The guide catheter was removed. Oblique arteriogram was performed of the right femoral artery and StarClose closure device was deployed in the usual fashion with excellent hemostatic result. The patient was taken to the recovery room in stable condition having tolerated the procedure well.  Findings:               Aortogram/Renal Arteries: Marked tortuosity of the aorta and iliac arteries without hemodynamically significant stenosis.  Left renal artery with very mild disease at the origin and no significant FMD.  Right renal artery had marked FMD creating hemodynamically significant stenosis in the right renal artery.   Condition:  Stable  Complications: None   Wendy Love 10/25/2021 8:55 AM  This note was created with Dragon Medical transcription system. Any errors in dictation are purely unintentional.

## 2021-10-25 NOTE — Progress Notes (Signed)
Pt. Ready for DC home: pt. Ambulated to BR, then back to bay. After pt. Dressed self, she c/o "I feel really dizzy." BP 74/53. Call Dr. Lucky Cowboy & orders received for NS bolus.

## 2021-10-25 NOTE — Progress Notes (Signed)
Pt. C/o nausea. Med. With zofran 4 mg slow IVP .

## 2021-10-25 NOTE — Interval H&P Note (Signed)
History and Physical Interval Note:  10/25/2021 8:03 AM  Wendy Love  has presented today for surgery, with the diagnosis of Renal Angio   Renal artery stenosis.  The various methods of treatment have been discussed with the patient and family. After consideration of risks, benefits and other options for treatment, the patient has consented to  Procedure(s): RENAL ANGIOGRAPHY (N/A) as a surgical intervention.  The patient's history has been reviewed, patient examined, no change in status, stable for surgery.  I have reviewed the patient's chart and labs.  Questions were answered to the patient's satisfaction.     [name]

## 2021-10-26 ENCOUNTER — Encounter: Payer: Self-pay | Admitting: Vascular Surgery

## 2021-10-26 ENCOUNTER — Telehealth (INDEPENDENT_AMBULATORY_CARE_PROVIDER_SITE_OTHER): Payer: Self-pay

## 2021-10-26 NOTE — Telephone Encounter (Signed)
Typically the lifting and bending restriction last for about a week.  She can remove the bandage 2 days following her procedure but she should replace it with a Band-Aid over the area.

## 2021-10-26 NOTE — Telephone Encounter (Signed)
Spoke with pt and she stated that last night before bed her bp was 172/85, pt did take her Amlodipine then. This morning her bp was 154/79 so she took her Losartan 100 mg. Pt is wanting to know what she should do to manage her bp until she finds out what the renal angiogram is going to do to her bp.

## 2021-10-26 NOTE — Telephone Encounter (Signed)
The pt called and left a VM on the nurses line saying that yesterday she had a renal angio and wants to know how long does she have the restriction of not bending or lifting. The pt also wanted to know when she could remove the bandage. Please advise.

## 2021-10-26 NOTE — Telephone Encounter (Signed)
Spoke with pt and she stated she would feel more comfortable just continuing the Losartan in the AM and the Amlodipine in the PM and see what her bp readings look like over the weekend. She stated that she would call us on Monday morning with those readings to see if she needs to add back the Carvedilol.

## 2021-10-26 NOTE — Telephone Encounter (Signed)
Pt returning call. Pt requesting callback. Pt stated that she is trying to give you updated BP check

## 2021-10-26 NOTE — Telephone Encounter (Signed)
Pt called back. Pt would like to know what time should she take her Blood pressure medication. Pt requesting callback

## 2021-10-29 NOTE — Telephone Encounter (Signed)
I called the pt and made her aware of the NP's instructions. 

## 2021-11-01 ENCOUNTER — Other Ambulatory Visit: Payer: Self-pay

## 2021-11-01 ENCOUNTER — Encounter: Payer: Self-pay | Admitting: Internal Medicine

## 2021-11-01 ENCOUNTER — Ambulatory Visit (INDEPENDENT_AMBULATORY_CARE_PROVIDER_SITE_OTHER): Payer: Medicare HMO | Admitting: Internal Medicine

## 2021-11-01 VITALS — BP 134/78 | HR 72 | Temp 98.2°F | Wt 131.8 lb

## 2021-11-01 DIAGNOSIS — I10 Essential (primary) hypertension: Secondary | ICD-10-CM

## 2021-11-01 DIAGNOSIS — I773 Arterial fibromuscular dysplasia: Secondary | ICD-10-CM

## 2021-11-01 LAB — BASIC METABOLIC PANEL
BUN: 13 mg/dL (ref 6–23)
CO2: 36 mEq/L — ABNORMAL HIGH (ref 19–32)
Calcium: 9.3 mg/dL (ref 8.4–10.5)
Chloride: 101 mEq/L (ref 96–112)
Creatinine, Ser: 0.6 mg/dL (ref 0.40–1.20)
GFR: 84.17 mL/min (ref >60.00)
Glucose, Bld: 87 mg/dL (ref 70–99)
Potassium: 3.4 mEq/L — ABNORMAL LOW (ref 3.5–5.1)
Sodium: 141 mEq/L (ref 135–145)

## 2021-11-01 NOTE — Patient Instructions (Addendum)
Add stool softener  (docusate) or metamucil  (BFL)  can be used daily for constipation management   There may be a role for long term use of amlodipine in FMD;  ask the FNP at your vascular follow up so we can know which BP medication may be stopped down the road

## 2021-11-01 NOTE — Progress Notes (Signed)
Subjective:  Patient ID: Wendy Love, female    DOB: 09-25-1940  Age: 81 y.o. MRN: [id number]  CC: The primary encounter diagnosis was Hypertension, unspecified type. A diagnosis of Fibromuscular dysplasia of renal artery (HCC) was also pertinent to this visit.   This visit occurred during the SARS-CoV-2 public health emergency.  Safety protocols were in place, including screening questions prior to the visit, additional usage of staff PPE, and extensive cleaning of exam room while observing appropriate contact time as indicated for disinfecting solutions.    HPI Wendy Love presents for follow up on uncontrolled hypertension with recent renal artery doppler done.  Chief Complaint  Patient presents with   Follow-up    F/u after angioplasty 1 week ago   1) underwent stenting of right renal artery on Feb 16 by Dr Lucky Cowboy.  FMD DIAGNOSED  Percutaneous transluminal angioplasty of the right renal artery with 5 mm diameter by 4 cm length Lutonix drug-coated angioplasty balloon.  Right femoral artery access, bandage removed 3 days ago .   No pain .  Taking plavix for 3 months . Has FU in one month . Notices she is urinating more frequently since the procedure   Lab Results  Component Value Date   CREATININE 0.60 11/01/2021   2) Renal hypertension:  BPs have been 10-25 pts lower post procedure .  Taking losartan 100 mg qam,  3.125 carvedilol breakfast,  amlodipine 5 mg evening and second carvedilol.  Occasionally doubles the carvedilol.    3) right elbow tender . No effusion or redness  no history of trauma   4) constipated;  reviewed choices of laxatives   5) continue Krill oil   Outpatient Medications Prior to Visit  Medication Sig Dispense Refill   ALPRAZolam (XANAX) 0.25 MG tablet Take 1 tablet (0.25 mg total) by mouth daily as needed for anxiety. (Patient not taking: Reported on 10/25/2021) 20 tablet 2   amLODipine (NORVASC) 5 MG tablet TAKE 1 TABLET BY MOUTH EVERY DAY 90  tablet 1   aspirin EC 81 MG tablet Take 1 tablet (81 mg total) by mouth daily. 150 tablet 2   b complex vitamins tablet Take 1 tablet by mouth daily.     CALCIUM-MAGNESIUM PO Take by mouth.     carvedilol (COREG) 3.125 MG tablet Take 1 tablet (3.125 mg total) by mouth 2 (two) times daily with a meal. (Patient taking differently: Take 3.125 mg by mouth 2 (two) times daily with a meal. Takes 6.25 mg twice daily) 180 tablet 3   Cholecalciferol (VITAMIN D3) 2000 UNITS TABS Take 5,000 Units by mouth daily.     clopidogrel (PLAVIX) 75 MG tablet Take 1 tablet (75 mg total) by mouth daily. 30 tablet 11   Coenzyme Q10 (CO Q-10) 100 MG CAPS Take by mouth daily.     furosemide (LASIX) 20 MG tablet Take 1 tablet (20 mg total) by mouth daily. 30 tablet 3   levothyroxine (SYNTHROID) 75 MCG tablet TAKE 1 TABLET BY MOUTH EVERY DAY 90 tablet 0   losartan (COZAAR) 50 MG tablet TAKE 2 TABLETS BY MOUTH EVERY DAY 180 tablet 1   Magnesium 300 MG CAPS Take by mouth daily.     melatonin 1 MG TABS tablet Take 2 mg by mouth at bedtime.     Multiple Vitamin (MULTIVITAMIN) tablet Take 1 tablet by mouth daily.     Omega-3 Fatty Acids (FISH OIL PO) Take by mouth daily.     PROBIOTIC CAPS Take by  mouth daily.     QUERCETIN PO Take 560 mg by mouth in the morning and at bedtime.     temazepam (RESTORIL) 15 MG capsule TAKE 1 CAPSULE BY MOUTH NIGHTLY 30 capsule 0   tretinoin (RETIN-A) 0.025 % cream APPLY TO WHOLE FACE EACH NIGHT.  5   vitamin C (ASCORBIC ACID) 500 MG tablet Take 1,000 mg by mouth daily.      Zinc 50 MG TABS Take 50 mg by mouth daily.     No facility-administered medications prior to visit.    Review of Systems;  Patient denies headache, fevers, malaise, unintentional weight loss, skin rash, eye pain, sinus congestion and sinus pain, sore throat, dysphagia,  hemoptysis , cough, dyspnea, wheezing, chest pain, palpitations, orthopnea, edema, abdominal pain, nausea, melena, diarrhea, constipation, flank pain,  dysuria, hematuria, urinary  Frequency, nocturia, numbness, tingling, seizures,  Focal weakness, Loss of consciousness,  Tremor, insomnia, depression, anxiety, and suicidal ideation.      Objective:  BP 134/78 (BP Location: Left Arm, Patient Position: Sitting, Cuff Size: Small)    Pulse 72    Temp 98.2 F (36.8 C) (Oral)    Wt 131 lb 12.8 oz (59.8 kg)    SpO2 99%    BMI 24.11 kg/m   BP Readings from Last 3 Encounters:  11/01/21 134/78  10/25/21 (!) 151/81  10/16/21 (!) 180/92    Wt Readings from Last 3 Encounters:  11/01/21 131 lb 12.8 oz (59.8 kg)  10/25/21 130 lb (59 kg)  10/16/21 131 lb 6.4 oz (59.6 kg)    General appearance: alert, cooperative and appears stated age Ears: normal TM's and external ear canals both ears Throat: lips, mucosa, and tongue normal; teeth and gums normal Neck: no adenopathy, no carotid bruit, supple, symmetrical, trachea midline and thyroid not enlarged, symmetric, no tenderness/mass/nodules Back: symmetric, no curvature. ROM normal. No CVA tenderness. Lungs: clear to auscultation bilaterally Heart: regular rate and rhythm, S1, S2 normal, no murmur, click, rub or gallop Abdomen: soft, non-tender; bowel sounds normal; no masses,  no organomegaly Pulses: 2+ and symmetric ExtL  right elbow without effusion, redness or warmth  Skin: Skin color, texture, turgor normal. No rashes or lesions Lymph nodes: Cervical, supraclavicular, and axillary nodes normal.  Lab Results  Component Value Date   HGBA1C 5.2 09/16/2019   HGBA1C 5.4 02/25/2019   HGBA1C 5.1 08/12/2018    Lab Results  Component Value Date   CREATININE 0.60 11/01/2021   CREATININE 0.54 10/25/2021   CREATININE 0.67 10/16/2021    Lab Results  Component Value Date   WBC 6.9 04/11/2021   HGB 14.1 04/11/2021   HCT 41.2 04/11/2021   PLT 258.0 04/11/2021   GLUCOSE 87 11/01/2021   CHOL 221 (H) 09/16/2019   TRIG 78.0 09/16/2019   HDL 85.80 09/16/2019   LDLDIRECT 115.0 08/12/2018    LDLCALC 120 (H) 09/16/2019   ALT 20 04/11/2021   AST 25 04/11/2021   NA 141 11/01/2021   K 3.4 (L) 11/01/2021   CL 101 11/01/2021   CREATININE 0.60 11/01/2021   BUN 13 11/01/2021   CO2 36 (H) 11/01/2021   TSH 0.67 10/16/2021   INR 1.0 04/11/2021   HGBA1C 5.2 09/16/2019   MICROALBUR <0.7 04/11/2020    PERIPHERAL VASCULAR CATHETERIZATION  Result Date: 10/25/2021 See surgical note for result.   Assessment & Plan:   Problem List Items Addressed This Visit     Fibromuscular dysplasia of renal artery ([place])    Found during workup for medication  resistant hypertension.  hyperaldo workup negative.  Treated successfully with balloon angioplasty by Dr Lucky Cowboy.  BP improving post procedure.  repeat Cr normal.  Hypokalemia  Secondary to  to increased diuresis .  Continue plavix s 3 months       Other Visit Diagnoses     Hypertension, unspecified type    -  Primary   Relevant Orders   Basic metabolic panel (Completed)   Basic metabolic panel       I spent 30 minutes dedicated to the care of this patient on the date of this encounter to include pre-visit review of patient's medical history,  most recent imaging studies, Face-to-face time with the patient , and post visit ordering of testing and therapeutics.    Follow-up: Return in about 6 months (around 05/01/2022).   Crecencio Mc, MD

## 2021-11-02 MED ORDER — POTASSIUM CHLORIDE CRYS ER 20 MEQ PO TBCR: 20.0000 meq | EXTENDED_RELEASE_TABLET | Freq: Every day | ORAL | 0 refills | Status: DC

## 2021-11-02 NOTE — Assessment & Plan Note (Addendum)
Found during workup for medication resistant hypertension.  hyperaldo workup negative.  Treated successfully with balloon angioplasty by Dr Lucky Cowboy.  BP improving post procedure.  repeat Cr normal.  Hypokalemia  Secondary to  to increased diuresis .  Continue plavix s 3 months

## 2021-11-04 ENCOUNTER — Encounter: Payer: Self-pay | Admitting: Internal Medicine

## 2021-11-07 ENCOUNTER — Encounter: Payer: Self-pay | Admitting: Internal Medicine

## 2021-11-08 NOTE — Telephone Encounter (Signed)
Pt is returning call.  

## 2021-11-08 NOTE — Telephone Encounter (Signed)
Spoke with pt and she stated that she has not checked her bp today so she is going to rest for about 10 minutes and check her bp then give [place] a call back.  ?

## 2021-11-08 NOTE — Telephone Encounter (Signed)
Spoke with pt and she stated that her bp right now is 183/103 HR has continued to stay in the 60s. Pt stated that she does not want to go to the ED. Pt has been scheduled with Dr. Olivia Mackie tomorrow morning. Pt was advised that if her bp got any higher, she developed chest pain, SOBr, worsening palpitations or dizziness that she would need to go to the ED and not wait for her appt tomorrow. Pt gave a verbal understanding.  ?

## 2021-11-09 ENCOUNTER — Other Ambulatory Visit: Payer: Self-pay

## 2021-11-09 ENCOUNTER — Encounter: Payer: Self-pay | Admitting: Internal Medicine

## 2021-11-09 ENCOUNTER — Ambulatory Visit (INDEPENDENT_AMBULATORY_CARE_PROVIDER_SITE_OTHER): Payer: Medicare HMO | Admitting: Internal Medicine

## 2021-11-09 VITALS — BP 150/90 | HR 85 | Temp 98.4°F | Ht 62.0 in | Wt 130.0 lb

## 2021-11-09 DIAGNOSIS — I10 Essential (primary) hypertension: Secondary | ICD-10-CM | POA: Diagnosis not present

## 2021-11-09 DIAGNOSIS — I150 Renovascular hypertension: Secondary | ICD-10-CM | POA: Diagnosis not present

## 2021-11-09 DIAGNOSIS — E876 Hypokalemia: Secondary | ICD-10-CM

## 2021-11-09 DIAGNOSIS — I773 Arterial fibromuscular dysplasia: Secondary | ICD-10-CM

## 2021-11-09 DIAGNOSIS — I701 Atherosclerosis of renal artery: Secondary | ICD-10-CM

## 2021-11-09 LAB — BASIC METABOLIC PANEL
BUN: 13 mg/dL (ref 6–23)
CO2: 30 mEq/L (ref 19–32)
Calcium: 9.4 mg/dL (ref 8.4–10.5)
Chloride: 101 mEq/L (ref 96–112)
Creatinine, Ser: 0.6 mg/dL (ref 0.40–1.20)
GFR: 84.15 mL/min (ref >60.00)
Glucose, Bld: 89 mg/dL (ref 70–99)
Potassium: 3.8 mEq/L (ref 3.5–5.1)
Sodium: 138 mEq/L (ref 135–145)

## 2021-11-09 MED ORDER — CARVEDILOL 6.25 MG PO TABS: 6.2500 mg | ORAL_TABLET | Freq: Two times a day (BID) | ORAL | 3 refills | Status: DC

## 2021-11-09 NOTE — Patient Instructions (Addendum)
Increase coreg 6.25 mg 2x per day ? ? ? ?Renovascular Hypertension ?Renovascular hypertension is high blood pressure that happens when the arteries that carry blood to the kidneys (renal arteries) become narrow. Blood pressure is a measurement of how strongly the blood presses against the walls of the arteries. Hypertension, or high blood pressure, is a condition in which blood pressure is higher than normal. ?Untreated hypertension, including renovascular hypertension, can lead to various health problems, such as: ?Heart failure. ?Heart disease. ?Stroke. ?Kidney failure. ?Blood vessel damage. ?Blindness or vision problems. ?A type of dementia caused by reduced blood flow to the brain (vascular dementia). ?What are the causes? ?Many conditions can cause the renal arteries to become narrow and lead to renovascular hypertension. Some of these are: ?Atherosclerosis. This is a hardening of the renal arteries. It causes plaque to build up and block the renal arteries. ?Fibromuscular hyperplasia. This is a condition in which cells of the artery wall overgrow, causing a narrowing of the renal arteries. It is a common cause of renovascular hypertension in younger women. ?A blockage in the renal artery due to injury, tumors, or blood clots. This is rare. ?What increases the risk? ?You are more likely to develop this condition if: ?You are a woman who is younger than age 80. ?You are a man who is older than age 54. ?You have a history of heart problems or strokes. ?What are the signs or symptoms? ?In many cases, there are no symptoms. If symptoms are present, they may include: ?Sudden high blood pressure that gets worse in older people who previously had well-controlled blood pressure. ?Nausea and vomiting. ?Vision problems. ?Chest pain. ?Flash pulmonary edema, which is sudden shortness of breath due to fluid buildup in the lungs. ?How is this diagnosed? ?This condition may be diagnosed based on: ?A physical exam and blood  pressure check. During the exam, your health care provider may use a stethoscope to listen for a "whooshing" noise over the abdomen or on either side between the ribs and the hip. ?Blood tests to measure renin (an enzyme that helps control your blood pressure) and to check your hormone levels, including a hormone called aldosterone. Aldosterone controls the salt and water balance in your body. ?Imaging tests. These may include: ?An ultrasound. This test uses sound waves to produce an image of the inside of your body. ?A CT scan of your abdomen. ?An MRI of the arteries that supply the kidneys. ?A renal angiogram. For this test, a dye is injected into a kidney artery to show narrowing of the artery on an X-ray. ?How is this treated? ?This condition may be treated with: ?Medicines to help you control your blood pressure. ?Treatments or lifestyle changes to address factors or conditions that may be contributing to high blood pressure. This may mean lowering your cholesterol, eating a heart-healthy diet, exercising, and maintaining a healthy body weight. ?Surgery to remove a blockage. This may be necessary if a renal artery is blocked badly. ?Percutaneous transluminal renal angioplasty (PTRA). This is a procedure to open narrow renal arteries if they are not completely blocked. Sometimes, a stent is placed in the artery to prevent the artery from becoming blocked again. ?Follow these instructions at home: ?Monitoring your blood pressure ? ?Monitor your blood pressure at home as told by your health care provider. ?A blood pressure reading is recorded as two numbers, such as 120 over 80 (or 120/80). The first ("top") number is called the systolic pressure. It is a measure of the  pressure in your arteries when your heart beats. The second ("bottom") number is called the diastolic pressure. It is a measure of the pressure in your arteries as your heart relaxes between beats. A normal blood pressure reading is: ?Systolic:  below 852. ?Diastolic: below 80. ?Your personal target blood pressure may vary depending on your medical conditions, your age, and other factors. ?Have your blood pressure rechecked as told by your health care provider. ?Lifestyle ?Work with your health care provider to maintain a healthy body weight or to lose weight. Ask what a healthy weight is for you. ?Get regular exercise. Try to do moderate exercise for at least 150 minutes each week (30 minutes on most days of the week). Moderate exercise can include walking, yoga, or gardening. ?Do not use any products that contain nicotine or tobacco, such as cigarettes, e-cigarettes, and chewing tobacco. If you need help quitting, ask your health care provider. ?Do not use street drugs. ?Eating and drinking ? ?Eat a heart-healthy diet. This may include: ?Following the DASH diet. This diet is high in fruits, vegetables, and whole grains. It is low in salt (sodium), saturated fat, and added sugars. ?Keeping your sodium intake below 1,500 mg per day. Do not add salt to your food. Check food labels to see how much sodium is in a food or beverage. ?Do not drink alcohol if: ?Your health care provider tells you not to drink. ?You are pregnant, may be pregnant, or are planning to become pregnant. ?If you drink alcohol: ?Limit how much you use to: ?0-1 drink a day for women. ?0-2 drinks a day for men. ?Be aware of how much alcohol is in your drink. In the [place] one drink equals one 12 oz bottle of beer (355 mL), one 5 oz glass of wine (148 mL), or one 1? oz glass of hard liquor (44 mL). ?Limit caffeine. Caffeine may make your renovascular hypertension worse. Check ingredients and nutrition facts to see if a food or beverage contains caffeine. ?General instructions ?Take over-the-counter and prescription medicines only as told by your health care provider. ?Keep all follow-up visits as told by your health care provider. This is important. ?Where to find more information ?For  information on the DASH diet, go to the [employer] website: www.kidney.org ?Contact a health care provider if: ?Your symptoms continue to get worse and medicine does not help. ?You have a fever. ?Get help right away if: ?You have shortness of breath. ?You have numbness on one side. ?You have muscle weakness. ?You are unable to speak. ?You feel light-headed or you faint. ?You have sudden spikes of high blood pressure. ?You have very high blood pressure. ?These symptoms may represent a serious problem that is an emergency. Do not wait to see if the symptoms will go away. Get medical help right away. Call your local emergency services (911 in the [place] Do not drive yourself to the hospital. ?Summary ?Renovascular hypertension is high blood pressure that happens when the arteries that carry blood to the kidneys (renal arteries) become narrow. ?In many cases, there are no symptoms for this condition. ?Treatments for renovascular hypertension include medicines, lifestyle changes, surgery to remove a blockage, or a procedure to open a narrow artery. ?Monitor your blood pressure at home as told by your health care provider. ?This information is not intended to replace advice given to you by your health care provider. Make sure you discuss any questions you have with your health care provider. ?Document Revised: 08/21/2019 Document Reviewed:  08/21/2019 ?Elsevier Patient Education ? [place]. ? ?

## 2021-11-09 NOTE — Progress Notes (Signed)
Chief Complaint  Patient presents with   Hypertension   F/u  1. Htn uncontrolled renovascular FMD on the right 2 weeks ago balloon angioplasty with Dr. Lucky Cowboy BP at home 150s-180s/80s-100s Hr 64-68  On norvasc 5 mg qd and coreg 3.125 mgbid and losartan 100 mg qd   Review of Systems  Constitutional:  Negative for weight loss.  HENT:  Negative for hearing loss.   Eyes:  Negative for blurred vision.  Respiratory:  Negative for shortness of breath.   Cardiovascular:  Negative for chest pain.  Gastrointestinal:  Negative for abdominal pain and blood in stool.  Genitourinary:  Negative for dysuria.  Musculoskeletal:  Negative for falls and joint pain.  Skin:  Negative for rash.  Neurological:  Negative for headaches.  Psychiatric/Behavioral:  Negative for depression.   Past Medical History:  Diagnosis Date   Anxiety    Bronchitis    Cancer ([place])    squamous cell   COVID-19 09/01/21   home test    Degenerative disc disease, lumbar    Depression    Endometriosis 1974   s/p abdominal surgery, 2nd surgery   High cholesterol    Hypertension    Hypothyroidism    Rhinosinusitis    Sciatica    Past Surgical History:  Procedure Laterality Date   ABDOMINAL HYSTERECTOMY  2000   APPENDECTOMY     CARPAL TUNNEL RELEASE     bilateral   CATARACT EXTRACTION W/PHACO Left 07/26/2020   Procedure: CATARACT EXTRACTION PHACO AND INTRAOCULAR LENS PLACEMENT (IOC) LEFT 6.30 01:10.2 9.0%;  Surgeon: Leandrew Koyanagi, MD;  Location: [facility];  Service: Ophthalmology;  Laterality: Left;   CATARACT EXTRACTION W/PHACO Right 10/18/2020   Procedure: CATARACT EXTRACTION PHACO AND INTRAOCULAR LENS PLACEMENT (IOC) RIGHT 9.18 01:14.6 12.3%;  Surgeon: Leandrew Koyanagi, MD;  Location: [facility];  Service: Ophthalmology;  Laterality: Right;   Endoscopy/colonoscopy  April 2012   Schatski's Ring , small hiatal hernia   hemilaminectomy  April 2010   L3-L4hemi, L4-L5 microdiskectomy   KNEE  ARTHROSCOPY WITH LATERAL MENISECTOMY Right 04/08/2018   Procedure: KNEE ARTHROSCOPY WITH PARTIAL LATERAL MENISECTOMY and debridement.;  Surgeon: Corky Mull, MD;  Location: [facility];  Service: Orthopedics;  Laterality: Right;   RENAL ANGIOGRAPHY N/A 10/25/2021   Procedure: RENAL ANGIOGRAPHY;  Surgeon: Algernon Huxley, MD;  Location: [facility];  Service: Cardiovascular;  Laterality: N/A;   Family History  Problem Relation Age of Onset   Ovarian cancer Mother    Cancer Mother 60       ovarian cancer, treated by [facility]   Heart disease Father    Breast cancer Neg Hx    Social History   Socioeconomic History   Marital status: Widowed    Spouse name: Not on file   Number of children: Not on file   Years of education: Not on file   Highest education level: Not on file  Occupational History   Not on file  Tobacco Use   Smoking status: Never   Smokeless tobacco: Never   Tobacco comments:    smoked socially in college  Vaping Use   Vaping Use: Never used  Substance and Sexual Activity   Alcohol use: No   Drug use: No   Sexual activity: Not Currently  Other Topics Concern   Not on file  Social History Narrative   Not on file   Social Determinants of Health   Financial Resource Strain: Low Risk    Difficulty of  Paying Living Expenses: Not hard at all  Food Insecurity: No Food Insecurity   Worried About [place] in the Last Year: Never true   Ran Out of Food in the Last Year: Never true  Transportation Needs: No Transportation Needs   Lack of Transportation (Medical): No   Lack of Transportation (Non-Medical): No  Physical Activity: Sufficiently Active   Days of Exercise per Week: 5 days   Minutes of Exercise per Session: 30 min  Stress: No Stress Concern Present   Feeling of Stress : Not at all  Social Connections: Unknown   Frequency of Communication with Friends and Family: More than three times a week   Frequency of Social Gatherings with  Friends and Family: More than three times a week   Attends Religious Services: Not on file   Active Member of Clubs or Organizations: Not on file   Attends Archivist Meetings: Not on file   Marital Status: Widowed  Human resources officer Violence: Not At Risk   Fear of Current or Ex-Partner: No   Emotionally Abused: No   Physically Abused: No   Sexually Abused: No   Current Meds  Medication Sig   ALPRAZolam (XANAX) 0.25 MG tablet Take 1 tablet (0.25 mg total) by mouth daily as needed for anxiety.   amLODipine (NORVASC) 5 MG tablet TAKE 1 TABLET BY MOUTH EVERY DAY   aspirin EC 81 MG tablet Take 1 tablet (81 mg total) by mouth daily.   b complex vitamins tablet Take 1 tablet by mouth daily.   CALCIUM-MAGNESIUM PO Take by mouth.   Cholecalciferol (VITAMIN D3) 2000 UNITS TABS Take 5,000 Units by mouth daily.   clopidogrel (PLAVIX) 75 MG tablet Take 1 tablet (75 mg total) by mouth daily.   Coenzyme Q10 (CO Q-10) 100 MG CAPS Take by mouth daily.   levothyroxine (SYNTHROID) 75 MCG tablet TAKE 1 TABLET BY MOUTH EVERY DAY   losartan (COZAAR) 50 MG tablet TAKE 2 TABLETS BY MOUTH EVERY DAY   melatonin 1 MG TABS tablet Take 2 mg by mouth at bedtime.   Multiple Vitamin (MULTIVITAMIN) tablet Take 1 tablet by mouth daily.   Omega-3 Fatty Acids (FISH OIL PO) Take by mouth daily.   potassium chloride SA (KLOR-CON M) 20 MEQ tablet Take 1 tablet (20 mEq total) by mouth daily.   PROBIOTIC CAPS Take by mouth daily.   QUERCETIN PO Take 560 mg by mouth in the morning and at bedtime.   temazepam (RESTORIL) 15 MG capsule TAKE 1 CAPSULE BY MOUTH NIGHTLY   tretinoin (RETIN-A) 0.025 % cream APPLY TO WHOLE FACE EACH NIGHT.   vitamin C (ASCORBIC ACID) 500 MG tablet Take 1,000 mg by mouth daily.    Zinc 50 MG TABS Take 50 mg by mouth daily.   [DISCONTINUED] carvedilol (COREG) 3.125 MG tablet Take 1 tablet (3.125 mg total) by mouth 2 (two) times daily with a meal. (Patient taking differently: Take 3.125 mg by  mouth 2 (two) times daily with a meal. Takes 6.25 mg twice daily)   Allergies  Allergen Reactions   Atorvastatin Other (See Comments)    myalgias   Brompheniramine-Pseudoeph     REACTION: agitation, nervous, sleepless   Codeine     REACTION: GI Upset   Diphenhydramine Hcl     REACTION: nervous, sleepless, agitated   Hctz [Hydrochlorothiazide] Other (See Comments)    Severe hyponatremia   Loratadine     REACTION: nervous, sleeplessness   Other Other (See Comments)  Tape left "burn-type" marks on face after Cataract surgery   Tape     Tape left "burn-type" marks on face after Cataract surgery   Triamterene     Hyperkalemia and hyponatremia   Tramadol Hcl Rash    hyperactivity   Recent Results (from the past 2160 hour(s))  TSH     Status: None   Collection Time: 10/16/21  2:45 PM  Result Value Ref Range   TSH 0.67 0.35 - 5.50 uIU/mL  Basic metabolic panel     Status: Abnormal   Collection Time: 10/16/21  2:45 PM  Result Value Ref Range   Sodium 142 135 - 145 mEq/L   Potassium 3.5 3.5 - 5.1 mEq/L   Chloride 102 96 - 112 mEq/L   CO2 25 19 - 32 mEq/L   Glucose, Bld 102 (H) 70 - 99 mg/dL   BUN 15 6 - 23 mg/dL   Creatinine, Ser 0.67 0.40 - 1.20 mg/dL   GFR 81.98 >60.00 mL/min    Comment: Calculated using the CKD-EPI Creatinine Equation (2021)   Calcium 9.8 8.4 - 10.5 mg/dL  Aldosterone + renin activity w/ ratio     Status: Abnormal   Collection Time: 10/16/21  2:45 PM  Result Value Ref Range   Aldosterone <1  ng/dL    Comment: . Unable to flag abnormal result(s), please refer     to reference range(s) below: . Adult Reference Ranges for Aldosterone, LC/MS/MS:     Upright  8:00 - 10:00 am    < or = 28 ng/dL     Upright  4:00 -  6:00 pm    < or = 21 ng/dL     Supine   8:00 - 10:00 am       3 - 16 ng/dL .    Renin Activity 0.09 (L) 0.25 - 5.82 ng/mL/h   ALDO / PRA Ratio see note     Comment: Unable to calculate . This test was developed and its analytical  performance characteristics have been determined by [place], [place]. It has not been cleared or approved by the U.S. Food and Drug Administration. This assay has been validated pursuant to the CLIA regulations and is used for clinical purposes. .   Cortisol     Status: None   Collection Time: 10/16/21  2:45 PM  Result Value Ref Range   Cortisol, Plasma 6.4 ug/dL    Comment: AM:  4.3 - 22.4 ug/dLPM:  3.1 - 16.7 ug/dL  BUN     Status: None   Collection Time: 10/25/21  7:46 AM  Result Value Ref Range   BUN 14 8 - 23 mg/dL    Comment: Performed at [facility], [address] [place], [place] [address]  Creatinine, serum     Status: None   Collection Time: 10/25/21  7:46 AM  Result Value Ref Range   Creatinine, Ser 0.54 0.44 - 1.00 mg/dL   GFR, Estimated >60 >60 mL/min    Comment: (NOTE) Calculated using the CKD-EPI Creatinine Equation (2021) Performed at [facility], [address] [place], [place] [address]   Basic metabolic panel     Status: Abnormal   Collection Time: 11/01/21 11:32 AM  Result Value Ref Range   Sodium 141 135 - 145 mEq/L   Potassium 3.4 (L) 3.5 - 5.1 mEq/L   Chloride 101 96 - 112 mEq/L   CO2 36 (H) 19 - 32 mEq/L   Glucose, Bld 87 70 -  99 mg/dL   BUN 13 6 - 23 mg/dL   Creatinine, Ser 0.60 0.40 - 1.20 mg/dL   GFR 84.17 >60.00 mL/min    Comment: Calculated using the CKD-EPI Creatinine Equation (2021)   Calcium 9.3 8.4 - 10.5 mg/dL   Objective  Body mass index is 23.78 kg/m. Wt Readings from Last 3 Encounters:  11/09/21 130 lb (59 kg)  11/01/21 131 lb 12.8 oz (59.8 kg)  10/25/21 130 lb (59 kg)   Temp Readings from Last 3 Encounters:  11/09/21 98.4 F (36.9 C) (Oral)  11/01/21 98.2 F (36.8 C) (Oral)  10/25/21 (!) 100.8 F (38.2 C) (Oral)   BP Readings from Last 3 Encounters:  11/09/21 (!) 150/90  11/01/21 134/78  10/25/21 (!) 151/81   Pulse Readings from Last 3 Encounters:  11/09/21  85  11/01/21 72  10/25/21 69    Physical Exam Vitals and nursing note reviewed.  Constitutional:      Appearance: Normal appearance. She is well-developed and well-groomed.  HENT:     Head: Normocephalic and atraumatic.  Eyes:     Conjunctiva/sclera: Conjunctivae normal.     Pupils: Pupils are equal, round, and reactive to light.  Cardiovascular:     Rate and Rhythm: Normal rate and regular rhythm.     Heart sounds: Normal heart sounds. No murmur heard. Pulmonary:     Effort: Pulmonary effort is normal.     Breath sounds: Normal breath sounds.  Abdominal:     General: Abdomen is flat. Bowel sounds are normal.     Tenderness: There is no abdominal tenderness.  Musculoskeletal:        General: No tenderness.  Skin:    General: Skin is warm and dry.  Neurological:     General: No focal deficit present.     Mental Status: She is alert and oriented to person, place, and time. Mental status is at baseline.     Cranial Nerves: Cranial nerves 2-12 are intact.     Gait: Gait is intact.  Psychiatric:        Attention and Perception: Attention and perception normal.        Mood and Affect: Mood and affect normal.        Speech: Speech normal.        Behavior: Behavior normal. Behavior is cooperative.        Thought Content: Thought content normal.        Cognition and Memory: Cognition and memory normal.        Judgment: Judgment normal.    Assessment  Plan  Hypertension, unspecified type - Plan: carvedilol (COREG) 6.25 MG tablet  Hypokalemia - Plan: Basic Metabolic Panel (BMET)  RAS (renal artery stenosis) ([place]) - Plan: carvedilol (COREG) 6.25 MG tablet bid from 3.125 mg bid  Renovascular hypertension - Plan: carvedilol (COREG) 6.25 MG tablet  On norvasc 5 mg qd and coreg 3.125 mgbid and losartan 100 mg qd   F/u with PCP in 4-6 weeks  Provider: Dr. Olivia Mackie [facility]

## 2021-11-14 ENCOUNTER — Telehealth: Payer: Self-pay | Admitting: Internal Medicine

## 2021-11-16 ENCOUNTER — Other Ambulatory Visit: Payer: Self-pay

## 2021-11-16 ENCOUNTER — Encounter (INDEPENDENT_AMBULATORY_CARE_PROVIDER_SITE_OTHER): Payer: Self-pay | Admitting: Nurse Practitioner

## 2021-11-16 ENCOUNTER — Other Ambulatory Visit (INDEPENDENT_AMBULATORY_CARE_PROVIDER_SITE_OTHER): Payer: Self-pay | Admitting: Vascular Surgery

## 2021-11-16 ENCOUNTER — Ambulatory Visit (INDEPENDENT_AMBULATORY_CARE_PROVIDER_SITE_OTHER): Payer: Medicare HMO | Admitting: Nurse Practitioner

## 2021-11-16 ENCOUNTER — Ambulatory Visit (INDEPENDENT_AMBULATORY_CARE_PROVIDER_SITE_OTHER): Payer: Medicare HMO

## 2021-11-16 VITALS — BP 226/107 | HR 80 | Resp 16 | Ht 62.0 in | Wt 127.0 lb

## 2021-11-16 DIAGNOSIS — E7800 Pure hypercholesterolemia, unspecified: Secondary | ICD-10-CM | POA: Diagnosis not present

## 2021-11-16 DIAGNOSIS — Z9862 Peripheral vascular angioplasty status: Secondary | ICD-10-CM

## 2021-11-16 DIAGNOSIS — I773 Arterial fibromuscular dysplasia: Secondary | ICD-10-CM | POA: Diagnosis not present

## 2021-11-16 DIAGNOSIS — I701 Atherosclerosis of renal artery: Secondary | ICD-10-CM

## 2021-11-22 ENCOUNTER — Other Ambulatory Visit: Payer: Self-pay | Admitting: Internal Medicine

## 2021-11-25 ENCOUNTER — Encounter (INDEPENDENT_AMBULATORY_CARE_PROVIDER_SITE_OTHER): Payer: Self-pay | Admitting: Nurse Practitioner

## 2021-11-25 NOTE — Progress Notes (Signed)
? ?Subjective:  ? ? Patient ID: Wendy Love, female    DOB: 08/03/1941, 81 y.o.   MRN: [id number] ?Chief Complaint  ?Patient presents with  ? Follow-up  ?  ultrasound  ? ? ?Wendy Love is an 81 year old female that recently underwent angioplasty of the right renal artery due to hypertension as well as underlying fibromuscular dysplasia.  The patient has tolerated the procedure well.  Unfortunately, there is still some issues with blood pressure as she is averaging in the 778E to 423N systolic. ? ?Today noninvasive studies show 1 to 59% stenosis bilaterally however velocities are improved in the right renal artery post angioplasty.  Kidney size is normal bilaterally.  No evidence of an aortic aneurysm with the largest diameter being 2 cm. ? ? ?Review of Systems  ?Neurological:  Negative for headaches.  ?All other systems reviewed and are negative. ? ?   ?Objective:  ? Physical Exam ?Vitals reviewed.  ?HENT:  ?   Head: Normocephalic.  ?Cardiovascular:  ?   Rate and Rhythm: Normal rate and regular rhythm.  ?Pulmonary:  ?   Effort: Pulmonary effort is normal.  ?Skin: ?   General: Skin is warm and dry.  ?Neurological:  ?   Mental Status: She is alert and oriented to person, place, and time.  ?Psychiatric:     ?   Mood and Affect: Mood normal.     ?   Behavior: Behavior normal.     ?   Thought Content: Thought content normal.     ?   Judgment: Judgment normal.  ? ? ?BP (!) 226/107 (BP Location: Left Arm)   Pulse 80   Resp 16   Ht '5\' 2"'$  (1.575 m)   Wt 127 lb (57.6 kg)   BMI 23.23 kg/m?  ? ?Past Medical History:  ?Diagnosis Date  ? Anxiety   ? Bronchitis   ? Cancer [facility])   ? squamous cell  ? COVID-19 09/01/21  ? home test   ? Degenerative disc disease, lumbar   ? Depression   ? Endometriosis 1974  ? s/p abdominal surgery, 2nd surgery  ? High cholesterol   ? Hypertension   ? Hypothyroidism   ? Rhinosinusitis   ? Sciatica   ? ? ?Social History  ? ?Socioeconomic History  ? Marital status: Widowed  ?  Spouse  name: Not on file  ? Number of children: Not on file  ? Years of education: Not on file  ? Highest education level: Not on file  ?Occupational History  ? Not on file  ?Tobacco Use  ? Smoking status: Never  ? Smokeless tobacco: Never  ? Tobacco comments:  ?  smoked socially in college  ?Vaping Use  ? Vaping Use: Never used  ?Substance and Sexual Activity  ? Alcohol use: No  ? Drug use: No  ? Sexual activity: Not Currently  ?Other Topics Concern  ? Not on file  ?Social History Narrative  ? Not on file  ? ?Social Determinants of Health  ? ?Financial Resource Strain: Low Risk   ? Difficulty of Paying Living Expenses: Not hard at all  ?Food Insecurity: No Food Insecurity  ? Worried About Charity fundraiser in the Last Year: Never true  ? Ran Out of Food in the Last Year: Never true  ?Transportation Needs: No Transportation Needs  ? Lack of Transportation (Medical): No  ? Lack of Transportation (Non-Medical): No  ?Physical Activity: Sufficiently Active  ? Days of Exercise per Week: 5  days  ? Minutes of Exercise per Session: 30 min  ?Stress: No Stress Concern Present  ? Feeling of Stress : Not at all  ?Social Connections: Unknown  ? Frequency of Communication with Friends and Family: More than three times a week  ? Frequency of Social Gatherings with Friends and Family: More than three times a week  ? Attends Religious Services: Not on file  ? Active Member of Clubs or Organizations: Not on file  ? Attends Archivist Meetings: Not on file  ? Marital Status: Widowed  ?Intimate Partner Violence: Not At Risk  ? Fear of Current or Ex-Partner: No  ? Emotionally Abused: No  ? Physically Abused: No  ? Sexually Abused: No  ? ? ?Past Surgical History:  ?Procedure Laterality Date  ? ABDOMINAL HYSTERECTOMY  2000  ? APPENDECTOMY    ? CARPAL TUNNEL RELEASE    ? bilateral  ? CATARACT EXTRACTION W/PHACO Left 07/26/2020  ? Procedure: CATARACT EXTRACTION PHACO AND INTRAOCULAR LENS PLACEMENT (IOC) LEFT 6.30 01:10.2 9.0%;   Surgeon: Leandrew Koyanagi, MD;  Location: [facility];  Service: Ophthalmology;  Laterality: Left;  ? CATARACT EXTRACTION W/PHACO Right 10/18/2020  ? Procedure: CATARACT EXTRACTION PHACO AND INTRAOCULAR LENS PLACEMENT (IOC) RIGHT 9.18 01:14.6 12.3%;  Surgeon: Leandrew Koyanagi, MD;  Location: [facility];  Service: Ophthalmology;  Laterality: Right;  ? Endoscopy/colonoscopy  April 2012  ? Schatski's Ring , small hiatal hernia  ? hemilaminectomy  April 2010  ? L3-L4hemi, L4-L5 microdiskectomy  ? KNEE ARTHROSCOPY WITH LATERAL MENISECTOMY Right 04/08/2018  ? Procedure: KNEE ARTHROSCOPY WITH PARTIAL LATERAL MENISECTOMY and debridement.;  Surgeon: Corky Mull, MD;  Location: [facility];  Service: Orthopedics;  Laterality: Right;  ? RENAL ANGIOGRAPHY N/A 10/25/2021  ? Procedure: RENAL ANGIOGRAPHY;  Surgeon: Algernon Huxley, MD;  Location: [facility];  Service: Cardiovascular;  Laterality: N/A;  ? ? ?Family History  ?Problem Relation Age of Onset  ? Ovarian cancer Mother   ? Cancer Mother 79  ?     ovarian cancer, treated by [facility]  ? Heart disease Father   ? Breast cancer Neg Hx   ? ? ?Allergies  ?Allergen Reactions  ? Atorvastatin Other (See Comments)  ?  myalgias  ? Brompheniramine-Pseudoeph   ?  REACTION: agitation, nervous, sleepless  ? Codeine   ?  REACTION: GI Upset  ? Diphenhydramine Hcl   ?  REACTION: nervous, sleepless, agitated  ? Hctz [Hydrochlorothiazide] Other (See Comments)  ?  Severe hyponatremia  ? Loratadine   ?  REACTION: nervous, sleeplessness  ? Other Other (See Comments)  ?  Tape left "burn-type" marks on face after Cataract surgery  ? Tape   ?  Tape left "burn-type" marks on face after Cataract surgery  ? Triamterene   ?  Hyperkalemia and hyponatremia  ? Tramadol Hcl Rash  ?  hyperactivity  ? ? ?CBC Latest Ref Rng & Units 04/11/2021 09/23/2018 12/30/2017  ?WBC 4.0 - 10.5 K/uL 6.9 5.1 4.9  ?Hemoglobin 12.0 - 15.0 g/dL 14.1 14.5 14.8  ?Hematocrit 36.0 - 46.0 % 41.2 40.5  42.1  ?Platelets 150.0 - 400.0 K/uL 258.0 224 252.0  ? ? ? ? ?CMP  ?   ?Component Value Date/Time  ? NA 138 11/09/2021 1046  ? NA 132 (L) 09/30/2011 1717  ? K 3.8 11/09/2021 1046  ? K 3.1 (L) 09/30/2011 1717  ? CL 101 11/09/2021 1046  ? CL 91 (L) 09/30/2011 1717  ? CO2 30 11/09/2021  1046  ? CO2 26 09/30/2011 1717  ? GLUCOSE 89 11/09/2021 1046  ? GLUCOSE 103 (H) 09/30/2011 1717  ? BUN 13 11/09/2021 1046  ? BUN 9 09/30/2011 1717  ? CREATININE 0.60 11/09/2021 1046  ? CREATININE 0.74 09/23/2018 0840  ? CALCIUM 9.4 11/09/2021 1046  ? CALCIUM 9.4 09/30/2011 1717  ? PROT 6.8 04/11/2021 1424  ? PROT 8.2 09/30/2011 1717  ? ALBUMIN 4.3 04/11/2021 1424  ? ALBUMIN 4.3 09/30/2011 1717  ? AST 25 04/11/2021 1424  ? AST 35 09/30/2011 1717  ? ALT 20 04/11/2021 1424  ? ALT 27 09/30/2011 1717  ? ALKPHOS 68 04/11/2021 1424  ? ALKPHOS 77 09/30/2011 1717  ? BILITOT 0.5 04/11/2021 1424  ? BILITOT 0.4 09/30/2011 1717  ? GFRNONAA >60 10/25/2021 0746  ? GFRNONAA >60 09/30/2011 1717  ? GFRAA >60 09/30/2011 1717  ? ? ? ?No results found. ? ?   ?Assessment & Plan:  ? ?1. Renal artery stenosis ([place]) ?The patient underwent angioplasty of the right renal artery.  Today there is improved flow post angioplasty in the right renal artery.  The patient still continues to have some issues with blood pressure control.  She will continue to follow-up with PCP for blood pressure management.  Patient will follow-up in 3 months for noninvasive studies. ?- VAS US RENAL ARTERY DUPLEX ? ?2. Pure hypercholesterolemia ?Patient to continue fish oil.  She will continue with regular evaluation by her PCP ? ?3. Fibromuscular dysplasia ([place]) ?Fibromuscular dysplasia tends to not only affect the renal arteries but also the carotid arteries.  We will have the patient undergo a carotid duplex at her follow-up to evaluate for possible carotid artery stenosis. ? ? ?Current Outpatient Medications on File Prior to Visit  ?Medication Sig Dispense Refill  ? ALPRAZolam (XANAX)  0.25 MG tablet Take 1 tablet (0.25 mg total) by mouth daily as needed for anxiety. 20 tablet 2  ? amLODipine (NORVASC) 5 MG tablet TAKE 1 TABLET BY MOUTH EVERY DAY 90 tablet 1  ? aspirin EC 81 MG tablet Take 1

## 2021-11-26 DIAGNOSIS — H40003 Preglaucoma, unspecified, bilateral: Secondary | ICD-10-CM | POA: Diagnosis not present

## 2021-11-27 ENCOUNTER — Telehealth: Payer: Self-pay | Admitting: Internal Medicine

## 2021-11-27 NOTE — Telephone Encounter (Signed)
Placed in results folder.  

## 2021-11-27 NOTE — Telephone Encounter (Signed)
Patient dropped off BP and pulse readings. Readings are up front in Dr Lupita Dawn color folder. ?

## 2021-11-28 ENCOUNTER — Other Ambulatory Visit: Payer: Self-pay | Admitting: Internal Medicine

## 2021-11-28 DIAGNOSIS — I10 Essential (primary) hypertension: Secondary | ICD-10-CM

## 2021-11-28 MED ORDER — AMLODIPINE BESYLATE 10 MG PO TABS: 10.0000 mg | ORAL_TABLET | Freq: Every day | ORAL | 1 refills | Status: DC

## 2021-11-28 NOTE — Telephone Encounter (Signed)
Spoke with pt and informed her that based off her readings Dr. Derrel Nip would like for her to increase her amlodipine to 10 mg daily. Pt was also advised to recheck bp in one week and let us know. Pt gave a verbal understanding.  ?

## 2021-12-02 DIAGNOSIS — J209 Acute bronchitis, unspecified: Secondary | ICD-10-CM | POA: Diagnosis not present

## 2021-12-04 IMAGING — DX DG CHEST 2V
2 series · 2 of 2 positions shown · non-contrast
Comparison: Chest x-ray 10/11/2020.

CLINICAL DATA: New onset of sweats.

EXAM:
CHEST - 2 VIEW

[chest pa]
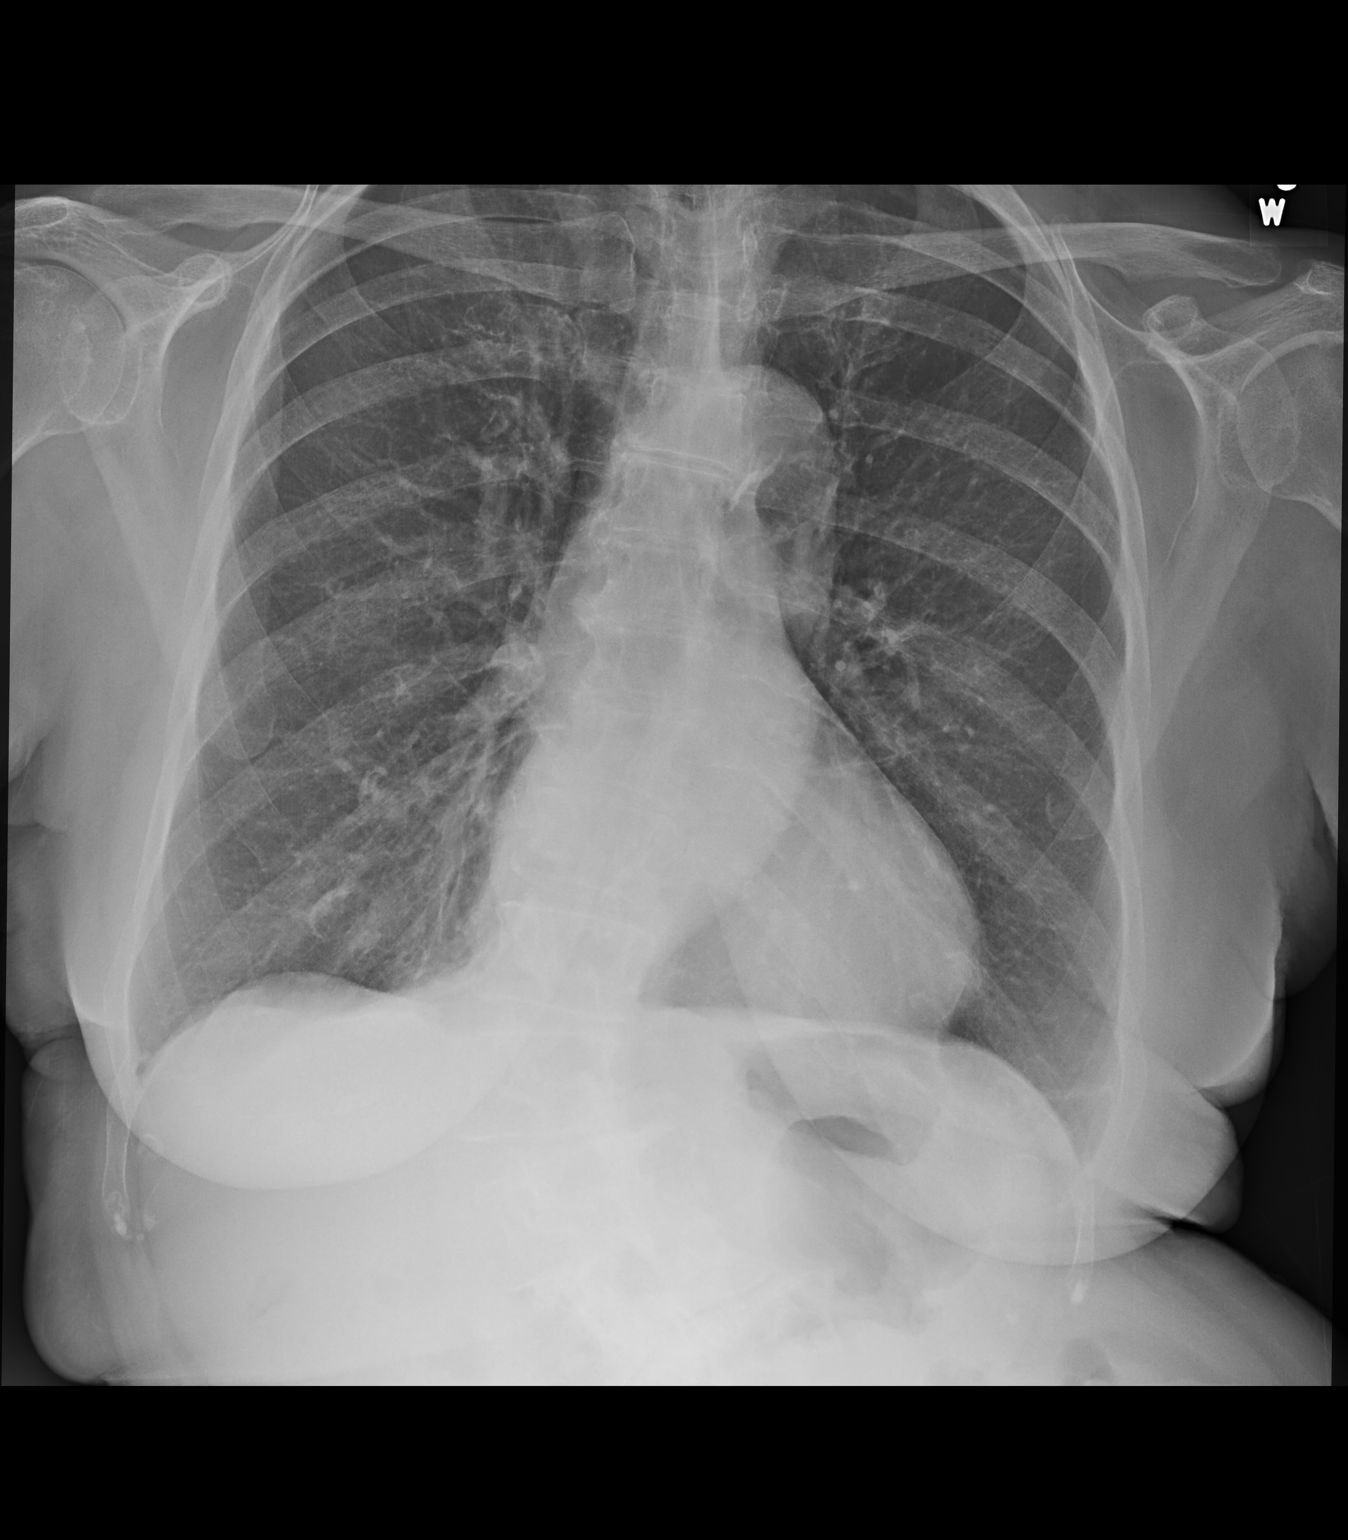

[chest lat]
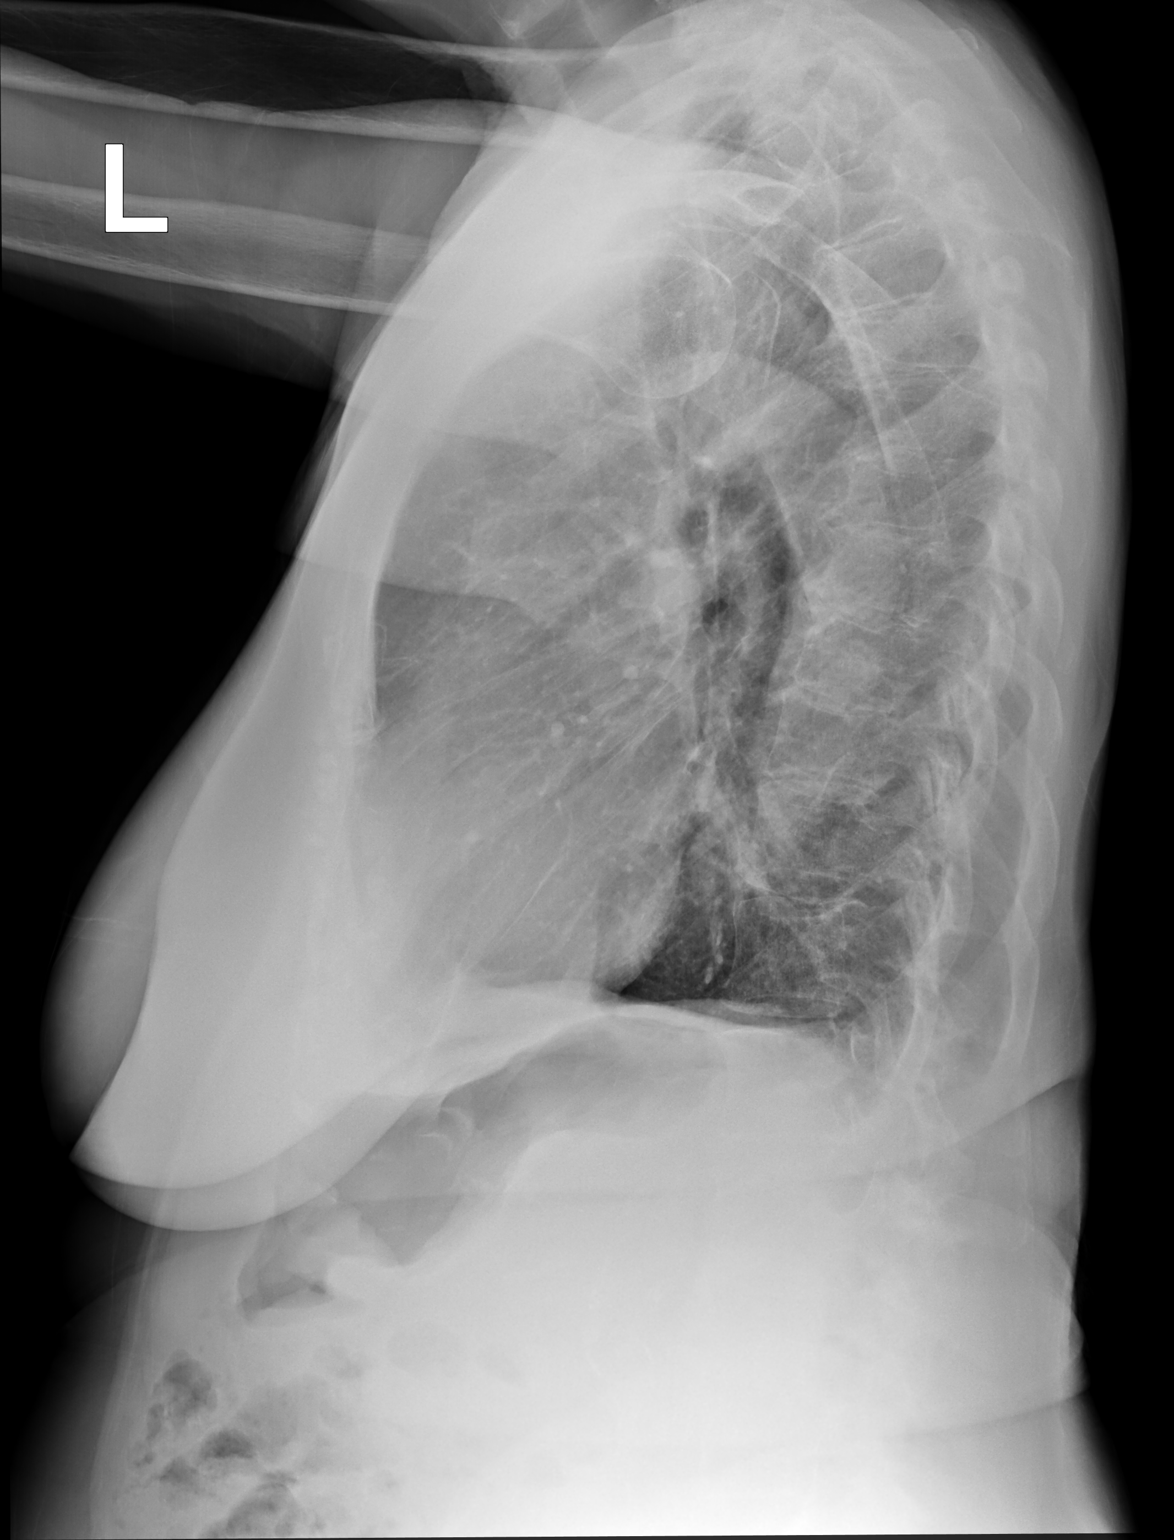

[2 of 2 positions shown; findings below may reference images not displayed]

FINDINGS: Mediastinum and hilar structures normal. Heart size normal. Mild
right base atelectasis and or scarring. No pleural effusion or
pneumothorax. Degenerative changes scoliosis thoracolumbar spine.
IMPRESSION: Mild right base atelectasis and or scarring. Exam otherwise
unremarkable.

## 2021-12-17 ENCOUNTER — Encounter: Payer: Self-pay | Admitting: Internal Medicine

## 2021-12-17 ENCOUNTER — Ambulatory Visit (INDEPENDENT_AMBULATORY_CARE_PROVIDER_SITE_OTHER): Payer: Medicare HMO | Admitting: Internal Medicine

## 2021-12-17 DIAGNOSIS — I10 Essential (primary) hypertension: Secondary | ICD-10-CM | POA: Diagnosis not present

## 2021-12-17 DIAGNOSIS — I773 Arterial fibromuscular dysplasia: Secondary | ICD-10-CM

## 2021-12-17 NOTE — Assessment & Plan Note (Signed)
Improving control on 3 medications  since  Undergoing balloon angioplasty of right renal artery on Feb 17 by [name] for renall artery stenosis secondary to  FMD.  hyperaldsteronism  workup negative and thyroid function is normalized on replacement therapy.   She avoids NSAIDS   She has deferred sleep study and alternative ARB therapy until she follows up with cardiology ?

## 2021-12-17 NOTE — Patient Instructions (Signed)
Your blood pressures  are improving,  but still not at goal.  ? ?I agree that we need to continue ruling out secondary causes: ? ?Sleep apnea  ?Valvular dysfunction ? ?I would not stop any medications or change anything until cardiology has a chance to evaluate you  ?

## 2021-12-17 NOTE — Assessment & Plan Note (Signed)
Found during workup for medication resistant hypertension.   Treated successfully with balloon angioplasty by Dr Lucky Cowboy.  BP improving post procedure.  repeat Cr normal.  Hypokalemia  Secondary to  to increased diuresis, now resolved.  .  Continue plavix x 3 months  ?

## 2021-12-17 NOTE — Progress Notes (Signed)
? ?Subjective:  ?Patient ID: Wendy Love, female    DOB: October 03, 1940  Age: 81 y.o. MRN: [id number] ? ?CC: Diagnoses of Fibromuscular dysplasia of renal artery (HCC) and White coat syndrome with hypertension were pertinent to this visit. ? ? ?This visit occurred during the SARS-CoV-2 public health emergency.  Safety protocols were in place, including screening questions prior to the visit, additional usage of staff PPE, and extensive cleaning of exam room while observing appropriate contact time as indicated for disinfecting solutions.   ? ?HPI ?Wendy Love presents for follow up on renal hypertension  ?Chief Complaint  ?Patient presents with  ? Follow-up  ?  Follow up on hypertension  ? ?1)  Patient is s/p vascular intervention for RAS (balloon angioplasty  of Right RA )on Feb 17 for treatment of FMD found during vascular evaluation for uncontrolled HTN.  Taking plavix.  Currently taking amlodipine 10 mg , losartan 100 mg daily , and carvedilol 6.25 mg bid. .  Home readings have improved somewhat,  were elevated again transiently during treatment for bronchitis several weeks ago,  and since then most readings have been < 150/80 and > 135/80 .  Repeat doppler done march 10 by Vascular noted <60% stenosis bilaterally.  Follow up in August is planned  ? ?She remains fatigued which she attributes to the carvedilol.  She walks 3 times per week for exercise at a leisurely pace,  but [facility] She is concerned about her blood pressure and whether another secondary cause exists.  No prior sleep study.  Sleeps well with no daytime hypersomnolence.  Lives alone  ? ?2) Recent bronchitis episode presented with chest tightness, cough and headache.  Symptoms resolved with prednisone and antibiotic .  ?Lab Results  ?Component Value Date  ? NA 138 11/09/2021  ? K 3.8 11/09/2021  ? CL 101 11/09/2021  ? CO2 30 11/09/2021  ? ? ? ? ? ?Outpatient Medications Prior to Visit   ?Medication Sig Dispense Refill  ? amLODipine (NORVASC) 10 MG tablet Take 1 tablet (10 mg total) by mouth daily. 90 tablet 1  ? aspirin EC 81 MG tablet Take 1 tablet (81 mg total) by mouth daily. 150 tablet 2  ? b complex vitamins tablet Take 1 tablet by mouth daily.    ? CALCIUM-MAGNESIUM PO Take by mouth.    ? carvedilol (COREG) 6.25 MG tablet Take 1 tablet (6.25 mg total) by mouth 2 (two) times daily with a meal. 180 tablet 3  ? Cholecalciferol (VITAMIN D3) 2000 UNITS TABS Take 5,000 Units by mouth daily.    ? clopidogrel (PLAVIX) 75 MG tablet Take 1 tablet (75 mg total) by mouth daily. 30 tablet 11  ? Coenzyme Q10 (CO Q-10) 100 MG CAPS Take by mouth daily.    ? levothyroxine (SYNTHROID) 75 MCG tablet TAKE 1 TABLET BY MOUTH EVERY DAY 90 tablet 0  ? losartan (COZAAR) 50 MG tablet TAKE 2 TABLETS BY MOUTH EVERY DAY 180 tablet 1  ? Magnesium 300 MG CAPS Take by mouth daily.    ? melatonin 1 MG TABS tablet Take 2 mg by mouth at bedtime.    ? Multiple Vitamin (MULTIVITAMIN) tablet Take 1 tablet by mouth daily.    ? Omega-3 Fatty Acids (FISH OIL PO) Take by mouth daily.    ? PROBIOTIC CAPS Take by mouth daily.    ? QUERCETIN PO Take 560 mg by mouth in the morning and at  bedtime.    ? temazepam (RESTORIL) 15 MG capsule TAKE 1 CAPSULE BY MOUTH NIGHTLY 30 capsule 0  ? tretinoin (RETIN-A) 0.025 % cream APPLY TO WHOLE FACE EACH NIGHT.  5  ? vitamin C (ASCORBIC ACID) 500 MG tablet Take 1,000 mg by mouth daily.     ? Zinc 50 MG TABS Take 50 mg by mouth daily.    ? ALPRAZolam (XANAX) 0.25 MG tablet Take 1 tablet (0.25 mg total) by mouth daily as needed for anxiety. (Patient not taking: Reported on 12/17/2021) 20 tablet 2  ? furosemide (LASIX) 20 MG tablet Take 1 tablet (20 mg total) by mouth daily. (Patient not taking: Reported on 12/17/2021) 30 tablet 3  ? potassium chloride SA (KLOR-CON M) 20 MEQ tablet Take 1 tablet (20 mEq total) by mouth daily. 5 tablet 0  ? ?No facility-administered medications prior to visit.  ? ? ?Review  of Systems; ? ?Patient denies headache, fevers, malaise, unintentional weight loss, skin rash, eye pain, sinus congestion and sinus pain, sore throat, dysphagia,  hemoptysis , cough, dyspnea, wheezing, chest pain, palpitations, orthopnea, edema, abdominal pain, nausea, melena, diarrhea, constipation, flank pain, dysuria, hematuria, urinary  Frequency, nocturia, numbness, tingling, seizures,  Focal weakness, Loss of consciousness,  Tremor, insomnia, depression, anxiety, and suicidal ideation.   ? ? ? ?Objective:  ?BP (!) 172/86 (BP Location: Left Arm, Patient Position: Sitting, Cuff Size: Normal)   Pulse 74   Temp 98 ?F (36.7 ?C) (Oral)   Ht '5\' 2"'$  (1.575 m)   Wt 130 lb 6.4 oz (59.1 kg)   SpO2 99%   BMI 23.85 kg/m?  ? ?BP Readings from Last 3 Encounters:  ?12/17/21 (!) 172/86  ?11/16/21 (!) 226/107  ?11/09/21 (!) 150/90  ? ? ?Wt Readings from Last 3 Encounters:  ?12/17/21 130 lb 6.4 oz (59.1 kg)  ?11/16/21 127 lb (57.6 kg)  ?11/09/21 130 lb (59 kg)  ? ? ?General appearance: alert, cooperative and appears stated age ?Ears: normal TM's and external ear canals both ears ?Throat: lips, mucosa, and tongue normal; teeth and gums normal ?Neck: no adenopathy, no carotid bruit, supple, symmetrical, trachea midline and thyroid not enlarged, symmetric, no tenderness/mass/nodules ?Back: symmetric, no curvature. ROM normal. No CVA tenderness. ?Lungs: clear to auscultation bilaterally ?Heart: regular rate and rhythm, S1, S2 normal, no murmur, click, rub or gallop ?Abdomen: soft, non-tender; bowel sounds normal; no masses,  no organomegaly ?Pulses: 2+ and symmetric ?Skin: Skin color, texture, turgor normal. No rashes or lesions ?Lymph nodes: Cervical, supraclavicular, and axillary nodes normal. ? ?Lab Results  ?Component Value Date  ? HGBA1C 5.2 09/16/2019  ? HGBA1C 5.4 02/25/2019  ? HGBA1C 5.1 08/12/2018  ? ? ?Lab Results  ?Component Value Date  ? CREATININE 0.60 11/09/2021  ? CREATININE 0.60 11/01/2021  ? CREATININE 0.54  10/25/2021  ? ? ?Lab Results  ?Component Value Date  ? WBC 6.9 04/11/2021  ? HGB 14.1 04/11/2021  ? HCT 41.2 04/11/2021  ? PLT 258.0 04/11/2021  ? GLUCOSE 89 11/09/2021  ? CHOL 221 (H) 09/16/2019  ? TRIG 78.0 09/16/2019  ? HDL 85.80 09/16/2019  ? LDLDIRECT 115.0 08/12/2018  ? LDLCALC 120 (H) 09/16/2019  ? ALT 20 04/11/2021  ? AST 25 04/11/2021  ? NA 138 11/09/2021  ? K 3.8 11/09/2021  ? CL 101 11/09/2021  ? CREATININE 0.60 11/09/2021  ? BUN 13 11/09/2021  ? CO2 30 11/09/2021  ? TSH 0.67 10/16/2021  ? INR 1.0 04/11/2021  ? HGBA1C 5.2 09/16/2019  ? MICROALBUR <  0.7 04/11/2020  ? ? ?PERIPHERAL VASCULAR CATHETERIZATION ? ?Result Date: 10/25/2021 ?See surgical note for result. ? ? ?Assessment & Plan:  ? ?Problem List Items Addressed This Visit   ? ? Fibromuscular dysplasia of renal artery (HCC)  ?  Found during workup for medication resistant hypertension.   Treated successfully with balloon angioplasty by Dr Lucky Cowboy.  BP improving post procedure.  repeat Cr normal.  Hypokalemia  Secondary to  to increased diuresis, now resolved.  .  Continue plavix x 3 months  ?  ?  ? White coat syndrome with hypertension  ?  Improving control on 3 medications  since  Undergoing balloon angioplasty of right renal artery on Feb 17 by [name] for renall artery stenosis secondary to  FMD.  hyperaldsteronism  workup negative and thyroid function is normalized on replacement therapy.   She avoids NSAIDS   She has deferred sleep study and alternative ARB therapy until she follows up with cardiology ?  ?  ? ? ?I spent 30 minutes dedicated to the care of this patient on the date of this encounter to include pre-visit review of patient's medical history,  most recent  office visits with me and patient's specialists,  most recent ER visit/hospitalization, EKG, imaging studies, Face-to-face time with the patient , and post visit ordering of testing and therapeutics.   ? ?Follow-up: No follow-ups on file. ? ? ?Crecencio Mc, MD ?

## 2021-12-24 ENCOUNTER — Other Ambulatory Visit: Payer: Self-pay | Admitting: Family

## 2021-12-25 MED ORDER — TEMAZEPAM 15 MG PO CAPS: 15.0000 mg | ORAL_CAPSULE | Freq: Every day | ORAL | 5 refills | Status: DC

## 2021-12-25 NOTE — Telephone Encounter (Signed)
Refilled: 09/26/2021 ?Last OV: 12/17/2021 ?Next OV: not scheduled ?

## 2021-12-25 NOTE — Telephone Encounter (Signed)
Patient called requesting a refill on this medication. She stated she has been on this medication for a long time. She stated she feels it should not have been denied. ?

## 2021-12-25 NOTE — Addendum Note (Signed)
Addended by: Adair Laundry on: 12/25/2021 03:01 PM ? ? Modules accepted: Orders ? ?

## 2022-01-22 ENCOUNTER — Encounter: Payer: Self-pay | Admitting: Nurse Practitioner

## 2022-01-22 ENCOUNTER — Ambulatory Visit: Payer: Medicare HMO | Admitting: Nurse Practitioner

## 2022-01-22 VITALS — BP 140/84 | HR 71 | Ht 63.0 in | Wt 131.4 lb

## 2022-01-22 DIAGNOSIS — I471 Supraventricular tachycardia: Secondary | ICD-10-CM

## 2022-01-22 DIAGNOSIS — E782 Mixed hyperlipidemia: Secondary | ICD-10-CM

## 2022-01-22 DIAGNOSIS — R0609 Other forms of dyspnea: Secondary | ICD-10-CM | POA: Diagnosis not present

## 2022-01-22 DIAGNOSIS — I773 Arterial fibromuscular dysplasia: Secondary | ICD-10-CM | POA: Diagnosis not present

## 2022-01-22 DIAGNOSIS — I701 Atherosclerosis of renal artery: Secondary | ICD-10-CM | POA: Diagnosis not present

## 2022-01-22 DIAGNOSIS — I10 Essential (primary) hypertension: Secondary | ICD-10-CM

## 2022-01-22 NOTE — Progress Notes (Signed)
? ? ?Office Visit  ?  ?Patient Name: Wendy Love ?Date of Encounter: 01/22/2022 ? ?Primary Care Provider:  Crecencio Mc, MD ?Primary Cardiologist:  Ida Rogue, MD ? ?Chief Complaint  ?  ?81 year old female with a history of paroxysmal atrial tachycardia, hypertension, aortic atherosclerosis, peripheral arterial disease, hyperlipidemia, fibromuscular dysplasia, and renal artery stenosis status post right renal artery angioplasty, who presents for follow-up related to dyspnea and hypertension. ? ?Past Medical History  ?  ?Past Medical History:  ?Diagnosis Date  ? Anxiety   ? Bronchitis   ? Cancer [facility])   ? squamous cell  ? Chest pain   ? a. 09/2010 Cor CTA: Nl cors. EF 56%.  ? Claudication [facility])   ? a. 11/2021 ABIs: R 1.09, L 1.01.  ? COVID-19 09/01/21  ? home test   ? Degenerative disc disease, lumbar   ? Depression   ? Endometriosis 1974  ? s/p abdominal surgery, 2nd surgery  ? Fibromuscular dysplasia ([place])   ? High cholesterol   ? Hypertension   ? Hypothyroidism   ? Rhinosinusitis   ? Right renal artery stenosis (HCC)   ? a. 10/2021 s/p PTA/DCBA R renal artery; b. 11/2021 Renal duplex: 1-59% bilat RA stenoses.  ? Sciatica   ? ?Past Surgical History:  ?Procedure Laterality Date  ? ABDOMINAL HYSTERECTOMY  2000  ? APPENDECTOMY    ? CARPAL TUNNEL RELEASE    ? bilateral  ? CATARACT EXTRACTION W/PHACO Left 07/26/2020  ? Procedure: CATARACT EXTRACTION PHACO AND INTRAOCULAR LENS PLACEMENT (IOC) LEFT 6.30 01:10.2 9.0%;  Surgeon: Leandrew Koyanagi, MD;  Location: [place];  Service: Ophthalmology;  Laterality: Left;  ? CATARACT EXTRACTION W/PHACO Right 10/18/2020  ? Procedure: CATARACT EXTRACTION PHACO AND INTRAOCULAR LENS PLACEMENT (IOC) RIGHT 9.18 01:14.6 12.3%;  Surgeon: Leandrew Koyanagi, MD;  Location: [place];  Service: Ophthalmology;  Laterality: Right;  ? Endoscopy/colonoscopy  April 2012  ? Schatski's Ring , small hiatal hernia  ? hemilaminectomy  April 2010  ? L3-L4hemi, L4-L5  microdiskectomy  ? KNEE ARTHROSCOPY WITH LATERAL MENISECTOMY Right 04/08/2018  ? Procedure: KNEE ARTHROSCOPY WITH PARTIAL LATERAL MENISECTOMY and debridement.;  Surgeon: Corky Mull, MD;  Location: [place];  Service: Orthopedics;  Laterality: Right;  ? RENAL ANGIOGRAPHY N/A 10/25/2021  ? Procedure: RENAL ANGIOGRAPHY;  Surgeon: Algernon Huxley, MD;  Location: [place] [facility];  Service: Cardiovascular;  Laterality: N/A;  ? ? ?Allergies ? ?Allergies  ?Allergen Reactions  ? Atorvastatin Other (See Comments)  ?  myalgias  ? Brompheniramine-Pseudoeph   ?  REACTION: agitation, nervous, sleepless  ? Codeine   ?  REACTION: GI Upset  ? Diphenhydramine Hcl   ?  REACTION: nervous, sleepless, agitated  ? Hctz [Hydrochlorothiazide] Other (See Comments)  ?  Severe hyponatremia  ? Loratadine   ?  REACTION: nervous, sleeplessness  ? Other Other (See Comments)  ?  Tape left "burn-type" marks on face after Cataract surgery  ? Tape   ?  Tape left "burn-type" marks on face after Cataract surgery  ? Triamterene   ?  Hyperkalemia and hyponatremia  ? Tramadol Hcl Rash  ?  hyperactivity  ? ? ?History of Present Illness  ?  ?81 year old female with a history of paroxysmal atrial tachycardia, hypertension, aortic atherosclerosis, peripheral arterial disease, hyperlipidemia, fibromuscular dysplasia, and renal artery stenosis status post right renal artery angioplasty.  Wendy Love has a long history of difficult to control hypertension.  In 2012, she had intermittent chest heaviness associated  with palpitations and flushing.  Coronary CT angiogram was performed and showed normal coronary arteries.  She has been managed with beta-blocker therapy for history of palpitations and tachycardia, and is currently on carvedilol.  In the setting of difficult to control hypertension, she underwent renal artery duplex which showed increased velocities in the right renal artery and bilateral 1 to 59% stenoses.  Findings were concerning for  fibromuscular dysplasia.  She subsequently underwent angiography and successful drug-coated balloon angioplasty of the right renal artery in February 2023.  Follow-up ultrasound March 2023 showed improved right renal artery flow and bilateral 1 to 59% stenosis.  She has some amount of chronic calf pain that occurs if she walks for more than about 30 minutes.  ABIs earlier this year were normal.  She has no recent history of chest pain or palpitations.  She occasionally notes dyspnea on exertion at higher levels of activity.  She is also accustomed to experience lower extremity swelling that progresses throughout the day, which she attributes to amlodipine therapy.  She walks 30 minutes daily without symptoms or limitations, but notes that she takes it easy.  She denies palpitations, PND, orthopnea, dizziness, syncope, or early satiety. ? ?Home Medications  ?  ?Current Outpatient Medications  ?Medication Sig Dispense Refill  ? ALPRAZolam (XANAX) 0.25 MG tablet Take 0.25 mg by mouth at bedtime as needed for anxiety.    ? amLODipine (NORVASC) 10 MG tablet Take 1 tablet (10 mg total) by mouth daily. 90 tablet 1  ? aspirin EC 81 MG tablet Take 1 tablet (81 mg total) by mouth daily. 150 tablet 2  ? b complex vitamins tablet Take 1 tablet by mouth daily.    ? CALCIUM-MAGNESIUM PO Take by mouth.    ? carvedilol (COREG) 6.25 MG tablet Take 1 tablet (6.25 mg total) by mouth 2 (two) times daily with a meal. 180 tablet 3  ? Cholecalciferol (VITAMIN D3) 2000 UNITS TABS Take 5,000 Units by mouth daily.    ? clopidogrel (PLAVIX) 75 MG tablet Take 1 tablet (75 mg total) by mouth daily. 30 tablet 11  ? Coenzyme Q10 (CO Q-10) 100 MG CAPS Take by mouth daily.    ? furosemide (LASIX) 20 MG tablet Take 1 tablet (20 mg total) by mouth daily. 30 tablet 3  ? levothyroxine (SYNTHROID) 75 MCG tablet TAKE 1 TABLET BY MOUTH EVERY DAY 90 tablet 0  ? losartan (COZAAR) 50 MG tablet TAKE 2 TABLETS BY MOUTH EVERY DAY 180 tablet 1  ? Magnesium 300  MG CAPS Take by mouth daily.    ? melatonin 1 MG TABS tablet Take 2 mg by mouth at bedtime.    ? Multiple Vitamin (MULTIVITAMIN) tablet Take 1 tablet by mouth daily.    ? Omega-3 Fatty Acids (FISH OIL PO) Take by mouth daily.    ? PROBIOTIC CAPS Take by mouth daily.    ? QUERCETIN PO Take 560 mg by mouth in the morning and at bedtime.    ? temazepam (RESTORIL) 15 MG capsule Take 1 capsule (15 mg total) by mouth at bedtime. 30 capsule 5  ? tretinoin (RETIN-A) 0.025 % cream APPLY TO WHOLE FACE EACH NIGHT.  5  ? vitamin C (ASCORBIC ACID) 500 MG tablet Take 1,000 mg by mouth daily.     ? Zinc 50 MG TABS Take 50 mg by mouth daily.    ? ?No current facility-administered medications for this visit.  ?  ? ?Review of Systems  ?  ?Is accustomed  to experiencing lower extremity swelling towards the end of the day.  Occasionally notes dyspnea exertion at higher levels of activity.  She denies chest pain, palpitations, PND, orthopnea, dizziness, syncope, edema, or early satiety.  All other systems reviewed and are otherwise negative except as noted above. ?  ? ?Physical Exam  ?  ?VS:  BP 140/84 (BP Location: Left Arm, Patient Position: Sitting, Cuff Size: Normal)   Pulse 71   Ht '5\' 3"'$  (1.6 m)   Wt 131 lb 6 oz (59.6 kg)   SpO2 98%   BMI 23.27 kg/m?  , BMI Body mass index is 23.27 kg/m?. ?    ?GEN: Well nourished, well developed, in no acute distress. ?HEENT: normal. ?Neck: Supple, no JVD, carotid bruits, or masses. ?Cardiac: RRR, no murmurs, rubs, or gallops. No clubbing, cyanosis, edema.  Radials/PT 2+ and equal bilaterally.  ?Respiratory:  Respirations regular and unlabored, clear to auscultation bilaterally. ?GI: Soft, nontender, nondistended, BS + x 4. ?MS: no deformity or atrophy. ?Skin: warm and dry, no rash. ?Neuro:  Strength and sensation are intact. ?Psych: Normal affect. ? ?Accessory Clinical Findings  ?  ?ECG personally reviewed by me today -regular sinus rhythm, 71- no acute changes. ? ?Lab Results  ?Component  Value Date  ? WBC 6.9 04/11/2021  ? HGB 14.1 04/11/2021  ? HCT 41.2 04/11/2021  ? MCV 96.9 04/11/2021  ? PLT 258.0 04/11/2021  ? ?Lab Results  ?Component Value Date  ? CREATININE 0.60 11/09/2021  ? BUN 13 03/03

## 2022-01-22 NOTE — Patient Instructions (Signed)
Medication Instructions:  ?No changes at this time.  ? ?*If you need a refill on your cardiac medications before your next appointment, please call your pharmacy* ? ? ?Lab Work: ?None ? ?If you have labs (blood work) drawn today and your tests are completely normal, you will receive your results only by: ?MyChart Message (if you have MyChart) OR ?A paper copy in the mail ?If you have any lab test that is abnormal or we need to change your treatment, we will call you to review the results. ? ? ?Testing/Procedures: ?Your physician has requested that you have an echocardiogram. Echocardiography is a painless test that uses sound waves to create images of your heart. It provides your doctor with information about the size and shape of your heart and how well your heart?s chambers and valves are working. This procedure takes approximately one hour. There are no restrictions for this procedure. ? ? ? ?Follow-Up: ?At [facility], you and your health needs are our priority.  As part of our continuing mission to provide you with exceptional heart care, we have created designated Provider Care Teams.  These Care Teams include your primary Cardiologist (physician) and Advanced Practice Providers (APPs -  Physician Assistants and Nurse Practitioners) who all work together to provide you with the care you need, when you need it. ? ? ?Your next appointment:   ?1 year(s) ? ?The format for your next appointment:   ?In Person ? ?Provider:   ?Ida Rogue, MD  ? ? ? ? ? ? ?Important Information About Sugar ? ? ? ? ?  ?

## 2022-01-23 DIAGNOSIS — D2262 Melanocytic nevi of left upper limb, including shoulder: Secondary | ICD-10-CM | POA: Diagnosis not present

## 2022-01-23 DIAGNOSIS — Z85828 Personal history of other malignant neoplasm of skin: Secondary | ICD-10-CM | POA: Diagnosis not present

## 2022-01-23 DIAGNOSIS — D2261 Melanocytic nevi of right upper limb, including shoulder: Secondary | ICD-10-CM | POA: Diagnosis not present

## 2022-01-23 DIAGNOSIS — D225 Melanocytic nevi of trunk: Secondary | ICD-10-CM | POA: Diagnosis not present

## 2022-02-03 ENCOUNTER — Other Ambulatory Visit: Payer: Self-pay | Admitting: Internal Medicine

## 2022-02-03 DIAGNOSIS — I10 Essential (primary) hypertension: Secondary | ICD-10-CM

## 2022-02-12 ENCOUNTER — Other Ambulatory Visit (INDEPENDENT_AMBULATORY_CARE_PROVIDER_SITE_OTHER): Payer: Self-pay | Admitting: Vascular Surgery

## 2022-02-12 ENCOUNTER — Ambulatory Visit (INDEPENDENT_AMBULATORY_CARE_PROVIDER_SITE_OTHER): Payer: Medicare HMO | Admitting: Vascular Surgery

## 2022-02-12 ENCOUNTER — Encounter (INDEPENDENT_AMBULATORY_CARE_PROVIDER_SITE_OTHER): Payer: Self-pay | Admitting: Vascular Surgery

## 2022-02-12 ENCOUNTER — Ambulatory Visit (INDEPENDENT_AMBULATORY_CARE_PROVIDER_SITE_OTHER): Payer: Medicare HMO

## 2022-02-12 VITALS — BP 139/78 | HR 78 | Resp 16 | Wt 131.6 lb

## 2022-02-12 DIAGNOSIS — I679 Cerebrovascular disease, unspecified: Secondary | ICD-10-CM | POA: Diagnosis not present

## 2022-02-12 DIAGNOSIS — E7800 Pure hypercholesterolemia, unspecified: Secondary | ICD-10-CM | POA: Diagnosis not present

## 2022-02-12 DIAGNOSIS — I150 Renovascular hypertension: Secondary | ICD-10-CM | POA: Diagnosis not present

## 2022-02-12 DIAGNOSIS — I701 Atherosclerosis of renal artery: Secondary | ICD-10-CM

## 2022-02-12 DIAGNOSIS — I6521 Occlusion and stenosis of right carotid artery: Secondary | ICD-10-CM | POA: Diagnosis not present

## 2022-02-12 DIAGNOSIS — I6529 Occlusion and stenosis of unspecified carotid artery: Secondary | ICD-10-CM | POA: Insufficient documentation

## 2022-02-12 DIAGNOSIS — I773 Arterial fibromuscular dysplasia: Secondary | ICD-10-CM

## 2022-02-12 NOTE — Assessment & Plan Note (Signed)
Renal artery duplex today shows normal velocities in both renal arteries after intervention on the right side 3 to 4 months ago.  Kidney lengths are equal between 9-1/2 and 10 cm bilaterally.  Clinically improved in terms of her blood pressure as well.  Recheck in 6 months.  Okay to come off of Plavix at this point.  Continue aspirin daily

## 2022-02-12 NOTE — Progress Notes (Signed)
MRN : [id number]  Wendy Love is a 81 y.o. (12-20-1940) female who presents with chief complaint of  Chief Complaint  Patient presents with   Follow-up    Ultrasound follow up  .  History of Present Illness: Patient returns today in follow up of renal artery fibromuscular dysplasia and renovascular hypertension.  She brings in her blood pressure log today and her blood pressures are significantly better over the last couple of months.  Her intervention was almost 4 months ago now and was an angioplasty of the right renal artery.  She had no periprocedural complications.  We are checking both her carotid arteries and her renal arteries today given the severe fibromuscular dysplasia seen on angiography.  The carotid artery showed near normal flow with no obvious changes of fibromuscular dysplasia and minimal stenosis in the right carotid artery and no stenosis in the left carotid artery. Renal artery duplex today shows normal velocities in both renal arteries after intervention on the right side 3 to 4 months ago.  Kidney lengths are equal between 9-1/2 and 10 cm bilaterally.  Current Outpatient Medications  Medication Sig Dispense Refill   ALPRAZolam (XANAX) 0.25 MG tablet Take 0.25 mg by mouth at bedtime as needed for anxiety.     amLODipine (NORVASC) 10 MG tablet Take 1 tablet (10 mg total) by mouth daily. 90 tablet 1   aspirin EC 81 MG tablet Take 1 tablet (81 mg total) by mouth daily. 150 tablet 2   b complex vitamins tablet Take 1 tablet by mouth daily.     CALCIUM-MAGNESIUM PO Take by mouth.     carvedilol (COREG) 6.25 MG tablet Take 1 tablet (6.25 mg total) by mouth 2 (two) times daily with a meal. 180 tablet 3   Cholecalciferol (VITAMIN D3) 2000 UNITS TABS Take 5,000 Units by mouth daily.     clopidogrel (PLAVIX) 75 MG tablet Take 1 tablet (75 mg total) by mouth daily. 30 tablet 11   Coenzyme Q10 (CO Q-10) 100 MG CAPS Take by mouth daily.     furosemide (LASIX) 20 MG tablet  Take 1 tablet (20 mg total) by mouth daily. 30 tablet 3   levothyroxine (SYNTHROID) 75 MCG tablet TAKE 1 TABLET BY MOUTH EVERY DAY 90 tablet 0   losartan (COZAAR) 50 MG tablet TAKE 2 TABLETS BY MOUTH EVERY DAY 180 tablet 1   Magnesium 300 MG CAPS Take by mouth daily.     melatonin 1 MG TABS tablet Take 2 mg by mouth at bedtime.     Multiple Vitamin (MULTIVITAMIN) tablet Take 1 tablet by mouth daily.     Omega-3 Fatty Acids (FISH OIL PO) Take by mouth daily.     PROBIOTIC CAPS Take by mouth daily.     QUERCETIN PO Take 560 mg by mouth in the morning and at bedtime.     temazepam (RESTORIL) 15 MG capsule Take 1 capsule (15 mg total) by mouth at bedtime. 30 capsule 5   tretinoin (RETIN-A) 0.025 % cream APPLY TO WHOLE FACE EACH NIGHT.  5   vitamin C (ASCORBIC ACID) 500 MG tablet Take 1,000 mg by mouth daily.      Zinc 50 MG TABS Take 50 mg by mouth daily.     amLODipine (NORVASC) 5 MG tablet TAKE 1 TABLET BY MOUTH EVERY DAY 90 tablet 1   No current facility-administered medications for this visit.    Past Medical History:  Diagnosis Date   Anxiety    Bronchitis  Cancer ([place])    squamous cell   Chest pain    a. 09/2010 Cor CTA: Nl cors. EF 56%.   Claudication [facility])    a. 11/2021 ABIs: R 1.09, L 1.01.   COVID-19 09/01/21   home test    Degenerative disc disease, lumbar    Depression    Endometriosis 1974   s/p abdominal surgery, 2nd surgery   Fibromuscular dysplasia (HCC)    High cholesterol    Hypertension    Hypothyroidism    Rhinosinusitis    Right renal artery stenosis ([place])    a. 10/2021 s/p PTA/DCBA R renal artery; b. 11/2021 Renal duplex: 1-59% bilat RA stenoses.   Sciatica     Past Surgical History:  Procedure Laterality Date   ABDOMINAL HYSTERECTOMY  2000   APPENDECTOMY     CARPAL TUNNEL RELEASE     bilateral   CATARACT EXTRACTION W/PHACO Left 07/26/2020   Procedure: CATARACT EXTRACTION PHACO AND INTRAOCULAR LENS PLACEMENT (IOC) LEFT 6.30 01:10.2 9.0%;  Surgeon:  Leandrew Koyanagi, MD;  Location: [place];  Service: Ophthalmology;  Laterality: Left;   CATARACT EXTRACTION W/PHACO Right 10/18/2020   Procedure: CATARACT EXTRACTION PHACO AND INTRAOCULAR LENS PLACEMENT (IOC) RIGHT 9.18 01:14.6 12.3%;  Surgeon: Leandrew Koyanagi, MD;  Location: [place];  Service: Ophthalmology;  Laterality: Right;   Endoscopy/colonoscopy  April 2012   Schatski's Ring , small hiatal hernia   hemilaminectomy  April 2010   L3-L4hemi, L4-L5 microdiskectomy   KNEE ARTHROSCOPY WITH LATERAL MENISECTOMY Right 04/08/2018   Procedure: KNEE ARTHROSCOPY WITH PARTIAL LATERAL MENISECTOMY and debridement.;  Surgeon: Corky Mull, MD;  Location: [place];  Service: Orthopedics;  Laterality: Right;   RENAL ANGIOGRAPHY N/A 10/25/2021   Procedure: RENAL ANGIOGRAPHY;  Surgeon: Algernon Huxley, MD;  Location: [facility];  Service: Cardiovascular;  Laterality: N/A;     Social History   Tobacco Use   Smoking status: Never   Smokeless tobacco: Never   Tobacco comments:    smoked socially in college  Vaping Use   Vaping Use: Never used  Substance Use Topics   Alcohol use: No   Drug use: No       Family History  Problem Relation Age of Onset   Ovarian cancer Mother    Cancer Mother 63       ovarian cancer, treated by [facility]   Heart disease Father    Breast cancer Neg Hx      Allergies  Allergen Reactions   Atorvastatin Other (See Comments)    myalgias   Brompheniramine-Pseudoeph     REACTION: agitation, nervous, sleepless   Codeine     REACTION: GI Upset   Diphenhydramine Hcl     REACTION: nervous, sleepless, agitated   Hctz [Hydrochlorothiazide] Other (See Comments)    Severe hyponatremia   Loratadine     REACTION: nervous, sleeplessness   Other Other (See Comments)    Tape left "burn-type" marks on face after Cataract surgery   Tape     Tape left "burn-type" marks on face after Cataract surgery   Triamterene      Hyperkalemia and hyponatremia   Tramadol Hcl Rash    hyperactivity     REVIEW OF SYSTEMS (Negative unless checked)  Constitutional: '[]'$ Weight loss  '[]'$ Fever  '[]'$ Chills Cardiac: '[]'$ Chest pain   '[]'$ Chest pressure   '[]'$ Palpitations   '[]'$ Shortness of breath when laying flat   '[]'$ Shortness of breath at rest   '[]'$ Shortness of breath with exertion. Vascular:  '[]'$   Pain in legs with walking   '[]'$ Pain in legs at rest   '[]'$ Pain in legs when laying flat   '[]'$ Claudication   '[]'$ Pain in feet when walking  '[]'$ Pain in feet at rest  '[]'$ Pain in feet when laying flat   '[]'$ History of DVT   '[]'$ Phlebitis   '[]'$ Swelling in legs   '[]'$ Varicose veins   '[]'$ Non-healing ulcers Pulmonary:   '[]'$ Uses home oxygen   '[]'$ Productive cough   '[]'$ Hemoptysis   '[]'$ Wheeze  '[]'$ COPD   '[]'$ Asthma Neurologic:  '[]'$ Dizziness  '[]'$ Blackouts   '[]'$ Seizures   '[]'$ History of stroke   '[]'$ History of TIA  '[]'$ Aphasia   '[]'$ Temporary blindness   '[]'$ Dysphagia   '[]'$ Weakness or numbness in arms   '[]'$ Weakness or numbness in legs Musculoskeletal:  '[x]'$ Arthritis   '[]'$ Joint swelling   '[]'$ Joint pain   '[x]'$ Low back pain Hematologic:  '[]'$ Easy bruising  '[]'$ Easy bleeding   '[]'$ Hypercoagulable state   '[]'$ Anemic   Gastrointestinal:  '[]'$ Blood in stool   '[]'$ Vomiting blood  '[]'$ Gastroesophageal reflux/heartburn   '[]'$ Abdominal pain Genitourinary:  '[]'$ Chronic kidney disease   '[]'$ Difficult urination  '[]'$ Frequent urination  '[]'$ Burning with urination   '[]'$ Hematuria Skin:  '[]'$ Rashes   '[]'$ Ulcers   '[]'$ Wounds Psychological:  '[x]'$ History of anxiety   '[]'$  History of major depression.  Physical Examination  BP 139/78 (BP Location: Left Arm)   Pulse 78   Resp 16   Wt 131 lb 9.6 oz (59.7 kg)   BMI 23.31 kg/m  Gen:  WD/WN, NAD.  Appears younger than stated age Head: [place]/AT, No temporalis wasting. Ear/Nose/Throat: Hearing grossly intact, nares w/o erythema or drainage Eyes: Conjunctiva clear. Sclera non-icteric Neck: Supple.  Trachea midline Pulmonary:  Good air movement, no use of accessory muscles.  Cardiac: RRR, no JVD Vascular:   Vessel Right Left  Radial Palpable Palpable                                   Gastrointestinal: soft, non-tender/non-distended. No guarding/reflex.  Musculoskeletal: M/S 5/5 throughout.  No deformity or atrophy.  No edema. Neurologic: Sensation grossly intact in extremities.  Symmetrical.  Speech is fluent.  Psychiatric: Judgment intact, Mood & affect appropriate for pt's clinical situation. Dermatologic: No rashes or ulcers noted.  No cellulitis or open wounds.      Labs No results found for this or any previous visit (from the past 2160 hour(s)).  Radiology No results found.  Assessment/Plan  Carotid stenosis The carotid artery showed near normal flow with no obvious changes of fibromuscular dysplasia and minimal stenosis in the right carotid artery and no stenosis in the left carotid artery.  This is a nearly normal finding in a patient over the age of 18 and very unlikely to cause any problems in the future.  We can check this as needed.  Fibromuscular dysplasia of renal artery (HCC) Renal artery duplex today shows normal velocities in both renal arteries after intervention on the right side 3 to 4 months ago.  Kidney lengths are equal between 9-1/2 and 10 cm bilaterally.  Clinically improved in terms of her blood pressure as well.  Recheck in 6 months.  Okay to come off of Plavix at this point.  Continue aspirin daily  Renovascular hypertension Blood pressure control is significantly improved after renal artery intervention on the right.  I will defer to her primary care physician whether or not she can reduce the medications but that may be a reasonable option at this point.  Hyperlipidemia lipid control important in reducing the progression of atherosclerotic disease. Continue statin therapy     [name], MD  02/12/2022 11:28 AM    This note was created with Dragon medical transcription system.  Any errors from dictation are purely unintentional

## 2022-02-12 NOTE — Assessment & Plan Note (Signed)
Blood pressure control is significantly improved after renal artery intervention on the right.  I will defer to her primary care physician whether or not she can reduce the medications but that may be a reasonable option at this point.

## 2022-02-12 NOTE — Assessment & Plan Note (Signed)
The carotid artery showed near normal flow with no obvious changes of fibromuscular dysplasia and minimal stenosis in the right carotid artery and no stenosis in the left carotid artery.  This is a nearly normal finding in a patient over the age of 60 and very unlikely to cause any problems in the future.  We can check this as needed.

## 2022-02-12 NOTE — Assessment & Plan Note (Signed)
lipid control important in reducing the progression of atherosclerotic disease. Continue statin therapy  

## 2022-02-20 ENCOUNTER — Telehealth: Payer: Self-pay | Admitting: Internal Medicine

## 2022-02-20 NOTE — Telephone Encounter (Signed)
Placed in results folder.  

## 2022-02-20 NOTE — Telephone Encounter (Signed)
Patient dropped off recent BP readings.  Placed in Dr Lupita Dawn colored folder up front.

## 2022-02-21 ENCOUNTER — Ambulatory Visit: Payer: Medicare HMO

## 2022-02-21 NOTE — Telephone Encounter (Signed)
Spoke with pt and she stated that she is going to try to reduce the Carvedilol by half and if her bp readings go back up she agreed to call the office to schedule a follow up.

## 2022-02-22 ENCOUNTER — Ambulatory Visit (INDEPENDENT_AMBULATORY_CARE_PROVIDER_SITE_OTHER): Payer: Medicare HMO

## 2022-02-22 VITALS — Ht 63.0 in | Wt 131.0 lb

## 2022-02-22 DIAGNOSIS — Z0000 Encounter for general adult medical examination without abnormal findings: Secondary | ICD-10-CM | POA: Diagnosis not present

## 2022-02-22 NOTE — Patient Instructions (Addendum)
  Ms. Peale , Thank you for taking time to come for your Medicare Wellness Visit. I appreciate your ongoing commitment to your health goals. Please review the following plan we discussed and let me know if I can assist you in the future.   These are the goals we discussed:  Goals      Maintain Healthy Lifestyle     Stay active Healthy diet        This is a list of the screening recommended for you and due dates:  Health Maintenance  Topic Date Due   Flu Shot  04/09/2022   Tetanus Vaccine  02/26/2023   DEXA scan (bone density measurement)  03/08/2024   Pneumonia Vaccine  Completed   HPV Vaccine  Aged Out   COVID-19 Vaccine  Discontinued   Zoster (Shingles) Vaccine  Discontinued

## 2022-02-22 NOTE — Progress Notes (Addendum)
Subjective:   Wendy Love is a 81 y.o. female who presents for Medicare Annual (Subsequent) preventive examination.  Review of Systems    No ROS.  Medicare Wellness Virtual Visit.  Visual/audio telehealth visit, UTA vital signs.   See social history for additional risk factors.   Cardiac Risk Factors include: advanced age (>65mn, >>25women)     Objective:    Today's Vitals   02/22/22 0822  Weight: 131 lb (59.4 kg)  Height: '5\' 3"'$  (1.6 m)   Body mass index is 23.21 kg/m.     02/22/2022    8:30 AM 02/20/2021    3:39 PM 10/18/2020    8:40 AM 07/26/2020   11:44 AM 02/18/2020    1:24 PM 02/17/2019   11:46 AM 04/08/2018   11:14 AM  Advanced Directives  Does Patient Have a Medical Advance Directive? Yes Yes Yes Yes Yes Yes Yes  Type of [place]amed[place]of [place] will [place] will [place]Living will Living will;Healthcare Power of APort NechesLiving will HHecks[place]villeLiving will [place] will  Does patient want to make changes to medical advance directive? No - Patient declined No - Patient declined No - Patient declined  No - Patient declined No - Patient declined No - Patient declined  Copy of H[place]in Chart? No - copy requested No - copy requested No - copy requested No - copy requested No - copy requested No - copy requested No - copy requested    Current Medications (verified) Outpatient Encounter Medications as of 02/22/2022  Medication Sig   ALPRAZolam (XANAX) 0.25 MG tablet Take 0.25 mg by mouth at bedtime as needed for anxiety.   amLODipine (NORVASC) 10 MG tablet Take 1 tablet (10 mg total) by mouth daily.   amLODipine (NORVASC) 5 MG tablet TAKE 1 TABLET BY MOUTH EVERY DAY   aspirin EC 81 MG tablet Take 1 tablet (81 mg total) by mouth daily.   b complex vitamins tablet Take 1 tablet by mouth daily.    CALCIUM-MAGNESIUM PO Take by mouth.   carvedilol (COREG) 6.25 MG tablet Take 1 tablet (6.25 mg total) by mouth 2 (two) times daily with a meal.   Cholecalciferol (VITAMIN D3) 2000 UNITS TABS Take 5,000 Units by mouth daily.   clopidogrel (PLAVIX) 75 MG tablet Take 1 tablet (75 mg total) by mouth daily. (Patient not taking: Reported on 02/22/2022)   Coenzyme Q10 (CO Q-10) 100 MG CAPS Take by mouth daily.   furosemide (LASIX) 20 MG tablet Take 1 tablet (20 mg total) by mouth daily.   levothyroxine (SYNTHROID) 75 MCG tablet TAKE 1 TABLET BY MOUTH EVERY DAY   losartan (COZAAR) 50 MG tablet TAKE 2 TABLETS BY MOUTH EVERY DAY   Magnesium 300 MG CAPS Take by mouth daily.   melatonin 1 MG TABS tablet Take 2 mg by mouth at bedtime.   Multiple Vitamin (MULTIVITAMIN) tablet Take 1 tablet by mouth daily.   Omega-3 Fatty Acids (FISH OIL PO) Take by mouth daily.   PROBIOTIC CAPS Take by mouth daily.   QUERCETIN PO Take 560 mg by mouth in the morning and at bedtime.   temazepam (RESTORIL) 15 MG capsule Take 1 capsule (15 mg total) by mouth at bedtime.   tretinoin (RETIN-A) 0.025 % cream APPLY TO WHOLE FACE EACH NIGHT.   vitamin C (ASCORBIC ACID) 500 MG tablet Take 1,000 mg by mouth daily.  Zinc 50 MG TABS Take 50 mg by mouth daily.   No facility-administered encounter medications on file as of 02/22/2022.    Allergies (verified) Atorvastatin, Brompheniramine-pseudoeph, Codeine, Diphenhydramine hcl, Hctz [hydrochlorothiazide], Loratadine, Other, Tape, Triamterene, and Tramadol hcl   History: Past Medical History:  Diagnosis Date   Anxiety    Bronchitis    Cancer ([place])    squamous cell   Chest pain    a. 09/2010 Cor CTA: Nl cors. EF 56%.   Claudication [facility])    a. 11/2021 ABIs: R 1.09, L 1.01.   COVID-19 09/01/21   home test    Degenerative disc disease, lumbar    Depression    Endometriosis 1974   s/p abdominal surgery, 2nd surgery   Fibromuscular dysplasia (HCC)    High cholesterol     Hypertension    Hypothyroidism    Rhinosinusitis    Right renal artery stenosis ([place])    a. 10/2021 s/p PTA/DCBA R renal artery; b. 11/2021 Renal duplex: 1-59% bilat RA stenoses.   Sciatica    Past Surgical History:  Procedure Laterality Date   ABDOMINAL HYSTERECTOMY  2000   APPENDECTOMY     CARPAL TUNNEL RELEASE     bilateral   CATARACT EXTRACTION W/PHACO Left 07/26/2020   Procedure: CATARACT EXTRACTION PHACO AND INTRAOCULAR LENS PLACEMENT (IOC) LEFT 6.30 01:10.2 9.0%;  Surgeon: Leandrew Koyanagi, MD;  Location: [place];  Service: Ophthalmology;  Laterality: Left;   CATARACT EXTRACTION W/PHACO Right 10/18/2020   Procedure: CATARACT EXTRACTION PHACO AND INTRAOCULAR LENS PLACEMENT (IOC) RIGHT 9.18 01:14.6 12.3%;  Surgeon: Leandrew Koyanagi, MD;  Location: [place];  Service: Ophthalmology;  Laterality: Right;   Endoscopy/colonoscopy  April 2012   Schatski's Ring , small hiatal hernia   hemilaminectomy  April 2010   L3-L4hemi, L4-L5 microdiskectomy   KNEE ARTHROSCOPY WITH LATERAL MENISECTOMY Right 04/08/2018   Procedure: KNEE ARTHROSCOPY WITH PARTIAL LATERAL MENISECTOMY and debridement.;  Surgeon: Corky Mull, MD;  Location: [place];  Service: Orthopedics;  Laterality: Right;   RENAL ANGIOGRAPHY N/A 10/25/2021   Procedure: RENAL ANGIOGRAPHY;  Surgeon: Algernon Huxley, MD;  Location: [place] [facility];  Service: Cardiovascular;  Laterality: N/A;   Family History  Problem Relation Age of Onset   Ovarian cancer Mother    Cancer Mother 67       ovarian cancer, treated by [facility]   Heart disease Father    Breast cancer Neg Hx    Social History   Socioeconomic History   Marital status: Widowed    Spouse name: Not on file   Number of children: Not on file   Years of education: Not on file   Highest education level: Not on file  Occupational History   Not on file  Tobacco Use   Smoking status: Never   Smokeless tobacco: Never   Tobacco  comments:    smoked socially in college  Vaping Use   Vaping Use: Never used  Substance and Sexual Activity   Alcohol use: No   Drug use: No   Sexual activity: Not Currently  Other Topics Concern   Not on file  Social History Narrative   Not on file   Social Determinants of Health   Financial Resource Strain: Low Risk  (02/22/2022)   Overall Financial Resource Strain (CARDIA)    Difficulty of Paying Living Expenses: Not hard at all  Food Insecurity: No Food Insecurity (02/22/2022)   Hunger Vital Sign    Worried About Running Out  of Food in the Last Year: Never true    [place] in the Last Year: Never true  Transportation Needs: No Transportation Needs (02/22/2022)   PRAPARE - Hydrologist (Medical): No    Lack of Transportation (Non-Medical): No  Physical Activity: Sufficiently Active (02/22/2022)   Exercise Vital Sign    Days of Exercise per Week: 5 days    Minutes of Exercise per Session: 30 min  Stress: No Stress Concern Present (02/22/2022)   Frank    Feeling of Stress : Not at all  Social Connections: Unknown (02/22/2022)   Social Connection and Isolation Panel [NHANES]    Frequency of Communication with Friends and Family: More than three times a week    Frequency of Social Gatherings with Friends and Family: More than three times a week    Attends Religious Services: Not on file    Active Member of Clubs or Organizations: Not on file    Attends Archivist Meetings: Not on file    Marital Status: Widowed    Tobacco Counseling Counseling given: Not Answered Tobacco comments: smoked socially in college   Clinical Intake:  Pre-visit preparation completed: Yes        Diabetes: No  How often do you need to have someone help you when you read instructions, pamphlets, or other written materials from your doctor or pharmacy?: 1 - Never  Interpreter  Needed?: No    Activities of Daily Living    02/22/2022    8:26 AM 10/25/2021    7:28 AM  In your present state of health, do you have any difficulty performing the following activities:  Hearing? 0 0  Vision? 0 0  Difficulty concentrating or making decisions? 0 0  Walking or climbing stairs? 0 0  Dressing or bathing? 0 0  Doing errands, shopping? 0   Preparing Food and eating ? N   Using the Toilet? N   In the past six months, have you accidently leaked urine? N   Do you have problems with loss of bowel control? N   Managing your Medications? N   Managing your Finances? N   Housekeeping or managing your Housekeeping? N    Patient Care Team: Crecencio Mc, MD as PCP - General (Internal Medicine) Minna Merritts, MD as PCP - Cardiology (Cardiology)  Indicate any recent Medical Services you may have received from other than [facility] providers in the past year (date may be approximate).     Assessment:   This is a routine wellness examination for Wendy Love.  Virtual Visit via Telephone Note  I connected with  Wendy Love on 02/22/22 at  8:15 AM EDT by telephone and verified that I am speaking with the correct person using two identifiers.  Persons participating in the virtual visit: patient/Nurse Health Advisor   I discussed the limitations of performing an evaluation and management service by telehealth. We continued and completed visit with audio only. Some vital signs may be absent or patient reported.   Hearing/Vision screen Hearing Screening - Comments:: Patient is able to hear conversational tones without difficulty. No issues reported. Vision Screening - Comments:: Followed by [facility] Wears corrective lenses  Cataract extraction, bilateral Glaucoma suspect; no drops in use They have seen their ophthalmologist in the last 12 months.   Dietary issues and exercise activities discussed: Current Exercise Habits: Home exercise routine, Type of  exercise:  walking, Time (Minutes): 30, Frequency (Times/Week): 5, Weekly Exercise (Minutes/Week): 150, Intensity: Mild Healthy diet Good water intake  Goals Addressed               This Visit's Progress     Patient Stated     COMPLETED: Weight (lb) < 134 lb (60.8 kg) (pt-stated)   131 lb (59.4 kg)     I'd love to lose about 5lb      Other     Maintain Healthy Lifestyle        Stay active Healthy diet       Depression Screen    02/22/2022    8:30 AM 12/17/2021   11:38 AM 10/16/2021    2:08 PM 07/12/2021   11:15 AM 04/11/2021    1:42 PM 02/20/2021    3:41 PM 04/11/2020    1:37 PM  PHQ 2/9 Scores  PHQ - 2 Score 0 0 0 0 0 0 0    Fall Risk    02/22/2022    8:29 AM 12/17/2021   11:38 AM 10/16/2021    2:08 PM 07/12/2021   11:15 AM 04/11/2021    1:42 PM  [place] in the past year? 0 0 0 1 0  Number falls in past yr: 0   1 0  Injury with Fall? 0   0 0  Risk for fall due to :  No Fall Risks History of fall(s)    Follow up Falls evaluation completed Falls evaluation completed Falls evaluation completed Falls evaluation completed Falls evaluation completed   [place]: Home free of loose throw rugs in walkways, pet beds, electrical cords, etc? Yes  Adequate lighting in your home to reduce risk of falls? Yes   ASSISTIVE DEVICES UTILIZED TO PREVENT FALLS: Life alert? No  Use of a cane, walker or w/c? No   TIMED UP AND GO: Was the test performed? No .   Cognitive Function:  Patient is alert and oriented x3.  Enjoys reading and other brain health stimulating activities.       02/17/2019   11:49 AM  6CIT Screen  What Year? 0 points  What month? 0 points  What time? 0 points  Count back from 20 0 points  Months in reverse 0 points  Repeat phrase 0 points  Total Score 0 points    Immunizations Immunization History  Administered Date(s) Administered   Influenza Split 06/10/2012, 06/09/2013   Influenza Whole 06/06/2009    Pneumococcal Conjugate-13 07/04/2015   Pneumococcal Polysaccharide-23 04/24/2009   Td 06/06/2009, 02/25/2013   Zoster, Live 05/28/2010   Shingrix vaccine- discontinued per patient.   Screening Tests Health Maintenance  Topic Date Due   INFLUENZA VACCINE  04/09/2022   TETANUS/TDAP  02/26/2023   DEXA SCAN  03/08/2024   Pneumonia Vaccine 27+ Years old  Completed   HPV VACCINES  Aged Out   COVID-19 Vaccine  Discontinued   Zoster Vaccines- Shingrix  Discontinued   Health Maintenance There are no preventive care reminders to display for this patient.  Lung Cancer Screening: (Low Dose CT Chest recommended if Age 18-80 years, 30 pack-year currently smoking OR have quit w/in 15years.) does not qualify.   Vision Screening: Recommended annual ophthalmology exams for early detection of glaucoma and other disorders of the eye.  Dental Screening: Recommended annual dental exams for proper oral hygiene.  Community Resource Referral / Chronic Care Management: CRR required this visit?  No   CCM  required this visit?  No      Plan:   Keep all routine maintenance appointments.   I have personally reviewed and noted the following in the patient's chart:   Medical and social history Use of alcohol, tobacco or illicit drugs  Current medications and supplements including opioid prescriptions.  Functional ability and status Nutritional status Physical activity Advanced directives List of other physicians Hospitalizations, surgeries, and ER visits in previous 12 months Vitals Screenings to include cognitive, depression, and falls Referrals and appointments  In addition, I have reviewed and discussed with patient certain preventive protocols, quality metrics, and best practice recommendations. A written personalized care plan for preventive services as well as general preventive health recommendations were provided to patient.     OBrien-Blaney, Towanda Hornstein L, LPN   9/41/7408      I have  reviewed the above information and agree with above.   Deborra Medina, MD

## 2022-02-26 ENCOUNTER — Ambulatory Visit (INDEPENDENT_AMBULATORY_CARE_PROVIDER_SITE_OTHER): Payer: Medicare HMO

## 2022-02-26 DIAGNOSIS — R0609 Other forms of dyspnea: Secondary | ICD-10-CM | POA: Diagnosis not present

## 2022-02-26 LAB — ECHOCARDIOGRAM COMPLETE
AR max vel: 1.91 cm2
AV Area VTI: 1.86 cm2
AV Area mean vel: 1.66 cm2
AV Mean grad: 2 mmHg
AV Peak grad: 3.8 mmHg
Ao pk vel: 0.97 m/s
Area-P 1/2: 4.1 cm2
S' Lateral: 2.4 cm
Single Plane A2C EF: 64.5 %

## 2022-05-01 ENCOUNTER — Ambulatory Visit (INDEPENDENT_AMBULATORY_CARE_PROVIDER_SITE_OTHER): Payer: Medicare HMO | Admitting: Internal Medicine

## 2022-05-01 ENCOUNTER — Encounter: Payer: Self-pay | Admitting: Internal Medicine

## 2022-05-01 VITALS — BP 130/82 | HR 68 | Temp 97.9°F | Resp 14 | Ht 63.0 in | Wt 133.0 lb

## 2022-05-01 DIAGNOSIS — I773 Arterial fibromuscular dysplasia: Secondary | ICD-10-CM

## 2022-05-01 DIAGNOSIS — I10 Essential (primary) hypertension: Secondary | ICD-10-CM

## 2022-05-01 DIAGNOSIS — I6521 Occlusion and stenosis of right carotid artery: Secondary | ICD-10-CM

## 2022-05-01 DIAGNOSIS — R7301 Impaired fasting glucose: Secondary | ICD-10-CM

## 2022-05-01 DIAGNOSIS — G4700 Insomnia, unspecified: Secondary | ICD-10-CM | POA: Diagnosis not present

## 2022-05-01 DIAGNOSIS — E7800 Pure hypercholesterolemia, unspecified: Secondary | ICD-10-CM

## 2022-05-01 DIAGNOSIS — E039 Hypothyroidism, unspecified: Secondary | ICD-10-CM

## 2022-05-01 DIAGNOSIS — Z1231 Encounter for screening mammogram for malignant neoplasm of breast: Secondary | ICD-10-CM

## 2022-05-01 LAB — COMPREHENSIVE METABOLIC PANEL
ALT: 22 U/L (ref 0–35)
AST: 26 U/L (ref 0–37)
Albumin: 4.4 g/dL (ref 3.5–5.2)
Alkaline Phosphatase: 61 U/L (ref 39–117)
BUN: 17 mg/dL (ref 6–23)
CO2: 31 mEq/L (ref 19–32)
Calcium: 9.8 mg/dL (ref 8.4–10.5)
Chloride: 98 mEq/L (ref 96–112)
Creatinine, Ser: 0.65 mg/dL (ref 0.40–1.20)
GFR: 82.27 mL/min (ref >60.00)
Glucose, Bld: 90 mg/dL (ref 70–99)
Potassium: 4.5 mEq/L (ref 3.5–5.1)
Sodium: 138 mEq/L (ref 135–145)
Total Bilirubin: 0.8 mg/dL (ref 0.2–1.2)
Total Protein: 6.7 g/dL (ref 6.0–8.3)

## 2022-05-01 LAB — TSH: TSH: 0.78 u[IU]/mL (ref 0.35–5.50)

## 2022-05-01 LAB — LIPID PANEL
Cholesterol: 246 mg/dL — ABNORMAL HIGH (ref 0–200)
HDL: 95.1 mg/dL (ref >39.00)
LDL Cholesterol: 134 mg/dL — ABNORMAL HIGH (ref 0–99)
NonHDL: 151.15
Total CHOL/HDL Ratio: 3
Triglycerides: 87 mg/dL (ref 0.0–149.0)
VLDL: 17.4 mg/dL (ref 0.0–40.0)

## 2022-05-01 LAB — HEMOGLOBIN A1C: Hgb A1c MFr Bld: 5.4 % (ref 4.6–6.5)

## 2022-05-01 NOTE — Progress Notes (Signed)
Subjective:  Patient ID: Wendy Love, female    DOB: 1941-06-10  Age: 81 y.o. MRN: [id number]  CC: The primary encounter diagnosis was Acquired hypothyroidism. Diagnoses of Pure hypercholesterolemia, Impaired fasting glucose, Fibromuscular dysplasia of renal artery ([place]), Stenosis of right carotid artery, Breast cancer screening by mammogram, Essential hypertension, and INSOMNIA, CHRONIC were also pertinent to this visit.   HPI Wendy Love presents for  Chief Complaint  Patient presents with   Follow-up    6 mon, disc bp & med adjustment.   1) HTN:  home readings have improved significantly and are currently raging from 884 to 166 systolic 06% of the time . Has reduced coreg to 3.125  mg bid,  amlodipine  to 5  mg and losartan 100 mg daily   2) FMD: s/p right renal artery stent in April 2023.  Follow up with Dew in June : carotids and renal arteries evaluated with doppler  and are all fine. [name] Has been off of plavix,  still taking aspirin  3) ECHO done by [facility]     Outpatient Medications Prior to Visit  Medication Sig Dispense Refill   ALPRAZolam (XANAX) 0.25 MG tablet Take 0.25 mg by mouth at bedtime as needed for anxiety.     aspirin EC 81 MG tablet Take 1 tablet (81 mg total) by mouth daily. 150 tablet 2   b complex vitamins tablet Take 1 tablet by mouth daily.     CALCIUM-MAGNESIUM PO Take by mouth.     carvedilol (COREG) 6.25 MG tablet Take 1 tablet (6.25 mg total) by mouth 2 (two) times daily with a meal. (Patient taking differently: Take 3.25 mg by mouth 2 (two) times daily with a meal.) 180 tablet 3   Cholecalciferol (VITAMIN D3) 2000 UNITS TABS Take 5,000 Units by mouth daily.     Coenzyme Q10 (CO Q-10) 100 MG CAPS Take by mouth daily.     levothyroxine (SYNTHROID) 75 MCG tablet TAKE 1 TABLET BY MOUTH EVERY DAY 90 tablet 0   losartan (COZAAR) 50 MG tablet TAKE 2 TABLETS BY MOUTH EVERY DAY 180 tablet 1   Magnesium 300 MG CAPS Take by mouth daily.      melatonin 1 MG TABS tablet Take 2 mg by mouth at bedtime.     Multiple Vitamin (MULTIVITAMIN) tablet Take 1 tablet by mouth daily.     Omega-3 Fatty Acids (FISH OIL PO) Take by mouth daily.     PROBIOTIC CAPS Take by mouth daily.     QUERCETIN PO Take 560 mg by mouth in the morning and at bedtime.     temazepam (RESTORIL) 15 MG capsule Take 1 capsule (15 mg total) by mouth at bedtime. (Patient taking differently: Take 5 mg by mouth at bedtime.) 30 capsule 5   tretinoin (RETIN-A) 0.025 % cream APPLY TO WHOLE FACE EACH NIGHT.  5   vitamin C (ASCORBIC ACID) 500 MG tablet Take 1,000 mg by mouth daily.      Zinc 50 MG TABS Take 50 mg by mouth daily.     amLODipine (NORVASC) 10 MG tablet Take 1 tablet (10 mg total) by mouth daily. 90 tablet 1   amLODipine (NORVASC) 5 MG tablet TAKE 1 TABLET BY MOUTH EVERY DAY 90 tablet 1   clopidogrel (PLAVIX) 75 MG tablet Take 1 tablet (75 mg total) by mouth daily. (Patient not taking: Reported on 02/22/2022) 30 tablet 11   furosemide (LASIX) 20 MG tablet Take 1 tablet (20 mg total) by  mouth daily. (Patient not taking: Reported on 05/01/2022) 30 tablet 3   No facility-administered medications prior to visit.    Review of Systems;  Patient denies headache, fevers, malaise, unintentional weight loss, skin rash, eye pain, sinus congestion and sinus pain, sore throat, dysphagia,  hemoptysis , cough, dyspnea, wheezing, chest pain, palpitations, orthopnea, edema, abdominal pain, nausea, melena, diarrhea, constipation, flank pain, dysuria, hematuria, urinary  Frequency, nocturia, numbness, tingling, seizures,  Focal weakness, Loss of consciousness,  Tremor, insomnia, depression, anxiety, and suicidal ideation.      Objective:  BP 130/82 (BP Location: Left Arm, Patient Position: Sitting, Cuff Size: Normal)   Pulse 68   Temp 97.9 F (36.6 C) (Oral)   Resp 14   Ht '5\' 3"'$  (1.6 m)   Wt 133 lb (60.3 kg)   SpO2 99%   BMI 23.56 kg/m   BP Readings from Last 3 Encounters:   05/01/22 130/82  02/12/22 139/78  01/22/22 140/84    Wt Readings from Last 3 Encounters:  05/01/22 133 lb (60.3 kg)  02/22/22 131 lb (59.4 kg)  02/12/22 131 lb 9.6 oz (59.7 kg)    General appearance: alert, cooperative and appears stated age Ears: normal TM's and external ear canals both ears Throat: lips, mucosa, and tongue normal; teeth and gums normal Neck: no adenopathy, no carotid bruit, supple, symmetrical, trachea midline and thyroid not enlarged, symmetric, no tenderness/mass/nodules Back: symmetric, no curvature. ROM normal. No CVA tenderness. Lungs: clear to auscultation bilaterally Heart: regular rate and rhythm, S1, S2 normal, no murmur, click, rub or gallop Abdomen: soft, non-tender; bowel sounds normal; no masses,  no organomegaly Pulses: 2+ and symmetric Skin: Skin color, texture, turgor normal. No rashes or lesions Lymph nodes: Cervical, supraclavicular, and axillary nodes normal.  Lab Results  Component Value Date   HGBA1C 5.2 09/16/2019   HGBA1C 5.4 02/25/2019   HGBA1C 5.1 08/12/2018    Lab Results  Component Value Date   CREATININE 0.60 11/09/2021   CREATININE 0.60 11/01/2021   CREATININE 0.54 10/25/2021    Lab Results  Component Value Date   WBC 6.9 04/11/2021   HGB 14.1 04/11/2021   HCT 41.2 04/11/2021   PLT 258.0 04/11/2021   GLUCOSE 89 11/09/2021   CHOL 221 (H) 09/16/2019   TRIG 78.0 09/16/2019   HDL 85.80 09/16/2019   LDLDIRECT 115.0 08/12/2018   LDLCALC 120 (H) 09/16/2019   ALT 20 04/11/2021   AST 25 04/11/2021   NA 138 11/09/2021   K 3.8 11/09/2021   CL 101 11/09/2021   CREATININE 0.60 11/09/2021   BUN 13 11/09/2021   CO2 30 11/09/2021   TSH 0.67 10/16/2021   INR 1.0 04/11/2021   HGBA1C 5.2 09/16/2019   MICROALBUR <0.7 04/11/2020    PERIPHERAL VASCULAR CATHETERIZATION  Result Date: 10/25/2021 See surgical note for result.   Assessment & Plan:   Problem List Items Addressed This Visit     Carotid stenosis   Essential  hypertension    Well controlled currently on carvedilol 3..125 mg bid  Losartan 100 mg daily  And amlodipine 5 mg daily .  No recurrence of RAS based on June 2023 dopplers . No changes today      Fibromuscular dysplasia of renal artery (HCC)   Hyperlipidemia   Relevant Orders   Lipid panel   Hypothyroidism - Primary   Relevant Orders   TSH   Impaired fasting glucose   Relevant Orders   Comprehensive metabolic panel   Hemoglobin A1c   INSOMNIA, CHRONIC  Managed with 5 mg restoril (patient opens the capsules and weighs the contents)  2 mg melatonin.  No changes today      Other Visit Diagnoses     Breast cancer screening by mammogram       Relevant Orders   MM 3D SCREEN BREAST BILATERAL       I spent a total of  32 minutes with this patient in a face to face visit on the date of this encounter reviewing the last office visit with me  in January,  most recent with patient's cardiologist and vascular surgeon, patient's  home blood pressure readings ,  most recent imaging study ,   and post visit ordering of testing and therapeutics.    Follow-up: Return in about 6 months (around 11/01/2022).   Crecencio Mc, MD

## 2022-05-01 NOTE — Assessment & Plan Note (Signed)
Managed with 5 mg restoril (patient opens the capsules and weighs the contents)  2 mg melatonin.  No changes today

## 2022-05-01 NOTE — Patient Instructions (Signed)
I'm thrilled with your blood pressure readings!  Change nothing!  Your annual mammogram has been ordered .  [facility] will not allow [place] to schedule it for you,  so please  call to make your appointment [phone]

## 2022-05-01 NOTE — Assessment & Plan Note (Signed)
Well controlled currently on carvedilol 3..125 mg bid  Losartan 100 mg daily  And amlodipine 5 mg daily .  No recurrence of RAS based on June 2023 dopplers . No changes today

## 2022-05-05 ENCOUNTER — Other Ambulatory Visit: Payer: Self-pay | Admitting: Internal Medicine

## 2022-05-24 ENCOUNTER — Ambulatory Visit
Admission: RE | Admit: 2022-05-24 | Discharge: 2022-05-24 | Disposition: A | Discharge status: Home / Self Care | Payer: Medicare HMO | Source: Ambulatory Visit | Attending: Internal Medicine | Admitting: Internal Medicine

## 2022-05-24 DIAGNOSIS — Z1231 Encounter for screening mammogram for malignant neoplasm of breast: Secondary | ICD-10-CM | POA: Insufficient documentation

## 2022-05-27 DIAGNOSIS — H40003 Preglaucoma, unspecified, bilateral: Secondary | ICD-10-CM | POA: Diagnosis not present

## 2022-06-04 ENCOUNTER — Other Ambulatory Visit: Payer: Self-pay | Admitting: Internal Medicine

## 2022-06-04 DIAGNOSIS — I10 Essential (primary) hypertension: Secondary | ICD-10-CM

## 2022-06-09 HISTORY — PX: REFRACTIVE SURGERY: SHX103

## 2022-06-10 DIAGNOSIS — H26491 Other secondary cataract, right eye: Secondary | ICD-10-CM | POA: Diagnosis not present

## 2022-07-01 DIAGNOSIS — H26491 Other secondary cataract, right eye: Secondary | ICD-10-CM | POA: Diagnosis not present

## 2022-07-03 ENCOUNTER — Other Ambulatory Visit: Payer: Self-pay | Admitting: Internal Medicine

## 2022-07-08 ENCOUNTER — Encounter (INDEPENDENT_AMBULATORY_CARE_PROVIDER_SITE_OTHER): Payer: Self-pay

## 2022-07-18 ENCOUNTER — Other Ambulatory Visit: Payer: Self-pay | Admitting: Internal Medicine

## 2022-07-22 DIAGNOSIS — D485 Neoplasm of uncertain behavior of skin: Secondary | ICD-10-CM | POA: Diagnosis not present

## 2022-07-22 DIAGNOSIS — R208 Other disturbances of skin sensation: Secondary | ICD-10-CM | POA: Diagnosis not present

## 2022-07-22 DIAGNOSIS — L570 Actinic keratosis: Secondary | ICD-10-CM | POA: Diagnosis not present

## 2022-08-01 ENCOUNTER — Other Ambulatory Visit: Payer: Self-pay | Admitting: Internal Medicine

## 2022-08-01 DIAGNOSIS — I10 Essential (primary) hypertension: Secondary | ICD-10-CM

## 2022-08-02 ENCOUNTER — Other Ambulatory Visit: Payer: Self-pay | Admitting: Family

## 2022-08-16 ENCOUNTER — Ambulatory Visit (INDEPENDENT_AMBULATORY_CARE_PROVIDER_SITE_OTHER): Payer: Medicare HMO | Admitting: Vascular Surgery

## 2022-08-16 ENCOUNTER — Encounter (INDEPENDENT_AMBULATORY_CARE_PROVIDER_SITE_OTHER): Payer: Self-pay | Admitting: Vascular Surgery

## 2022-08-16 ENCOUNTER — Ambulatory Visit (INDEPENDENT_AMBULATORY_CARE_PROVIDER_SITE_OTHER): Payer: Medicare HMO

## 2022-08-16 VITALS — BP 128/73 | HR 83 | Resp 16 | Wt 134.0 lb

## 2022-08-16 DIAGNOSIS — I773 Arterial fibromuscular dysplasia: Secondary | ICD-10-CM | POA: Diagnosis not present

## 2022-08-16 DIAGNOSIS — I150 Renovascular hypertension: Secondary | ICD-10-CM

## 2022-08-16 DIAGNOSIS — E7800 Pure hypercholesterolemia, unspecified: Secondary | ICD-10-CM | POA: Diagnosis not present

## 2022-08-16 NOTE — Assessment & Plan Note (Signed)
Her duplex today shows no hemodynamically significant stenosis in either renal artery good kidney size is stable from her previous duplex.  Doing well status post intervention with improved blood pressure control.  Defer to primary care provider in terms of medication management.  Follow-up in 6 months with duplex.  No change in medical regimen.

## 2022-08-16 NOTE — Progress Notes (Signed)
MRN : [id number]  Wendy Love is a 81 y.o. (1941-01-13) female who presents with chief complaint of  Chief Complaint  Patient presents with   Follow-up    Ultrasound follow up  .  History of Present Illness: Patient returns today in follow up of renal fibromuscular dysplasia with stenosis and renovascular hypertension.  She is about 9 months status post angioplasty of her right renal artery for fibromuscular dysplasia causing stenosis.  Her blood pressure control is much better.  She says she is cut 2 of her blood pressure medications in half like to get off of the medications.  Her blood pressure today is excellent she says in general it has been normal.  Her duplex today shows no hemodynamically significant stenosis in either renal artery good kidney size is stable from her previous duplex.  Current Outpatient Medications  Medication Sig Dispense Refill   ALPRAZolam (XANAX) 0.25 MG tablet Take 0.25 mg by mouth at bedtime as needed for anxiety.     amLODipine (NORVASC) 5 MG tablet TAKE 1 TABLET BY MOUTH EVERY DAY 90 tablet 1   aspirin EC 81 MG tablet Take 1 tablet (81 mg total) by mouth daily. 150 tablet 2   b complex vitamins tablet Take 1 tablet by mouth daily.     CALCIUM-MAGNESIUM PO Take by mouth.     carvedilol (COREG) 3.125 MG tablet TAKE 1 TABLET BY MOUTH TWICE A DAY WITH A MEAL 180 tablet 3   carvedilol (COREG) 6.25 MG tablet Take 1 tablet (6.25 mg total) by mouth 2 (two) times daily with a meal. (Patient taking differently: Take 3.25 mg by mouth 2 (two) times daily with a meal.) 180 tablet 3   Cholecalciferol (VITAMIN D3) 2000 UNITS TABS Take 5,000 Units by mouth daily.     Coenzyme Q10 (CO Q-10) 100 MG CAPS Take by mouth daily.     levothyroxine (SYNTHROID) 75 MCG tablet TAKE 1 TABLET BY MOUTH EVERY DAY 90 tablet 0   losartan (COZAAR) 50 MG tablet TAKE 2 TABLETS BY MOUTH EVERY DAY 180 tablet 1   Magnesium 300 MG CAPS Take by mouth daily.     melatonin 1 MG TABS  tablet Take 2 mg by mouth at bedtime.     Multiple Vitamin (MULTIVITAMIN) tablet Take 1 tablet by mouth daily.     Omega-3 Fatty Acids (FISH OIL PO) Take by mouth daily.     PROBIOTIC CAPS Take by mouth daily.     QUERCETIN PO Take 560 mg by mouth in the morning and at bedtime.     temazepam (RESTORIL) 15 MG capsule Take 1 capsule (15 mg total) by mouth at bedtime. (Patient taking differently: Take 5 mg by mouth at bedtime.) 30 capsule 5   tretinoin (RETIN-A) 0.025 % cream APPLY TO WHOLE FACE EACH NIGHT.  5   vitamin C (ASCORBIC ACID) 500 MG tablet Take 1,000 mg by mouth daily.      Zinc 50 MG TABS Take 50 mg by mouth daily.     No current facility-administered medications for this visit.    Past Medical History:  Diagnosis Date   Anxiety    Bronchitis    Cancer ([place])    squamous cell   Chest pain    a. 09/2010 Cor CTA: Nl cors. EF 56%.   [facility])    a. 11/2021 ABIs: R 1.09, L 1.01.   COVID-19 09/01/21   home test    Degenerative disc disease, lumbar  Depression    Endometriosis 1974   s/p abdominal surgery, 2nd surgery   Fibromuscular dysplasia (HCC)    High cholesterol    Hypertension    Hypothyroidism    Rhinosinusitis    Right renal artery stenosis ([place])    a. 10/2021 s/p PTA/DCBA R renal artery; b. 11/2021 Renal duplex: 1-59% bilat RA stenoses.   Sciatica     Past Surgical History:  Procedure Laterality Date   ABDOMINAL HYSTERECTOMY  2000   APPENDECTOMY     CARPAL TUNNEL RELEASE     bilateral   CATARACT EXTRACTION W/PHACO Left 07/26/2020   Procedure: CATARACT EXTRACTION PHACO AND INTRAOCULAR LENS PLACEMENT (IOC) LEFT 6.30 01:10.2 9.0%;  Surgeon: Leandrew Koyanagi, MD;  Location: [place];  Service: Ophthalmology;  Laterality: Left;   CATARACT EXTRACTION W/PHACO Right 10/18/2020   Procedure: CATARACT EXTRACTION PHACO AND INTRAOCULAR LENS PLACEMENT (IOC) RIGHT 9.18 01:14.6 12.3%;  Surgeon: Leandrew Koyanagi, MD;  Location: [place];   Service: Ophthalmology;  Laterality: Right;   Endoscopy/colonoscopy  April 2012   Schatski's Ring , small hiatal hernia   hemilaminectomy  April 2010   L3-L4hemi, L4-L5 microdiskectomy   KNEE ARTHROSCOPY WITH LATERAL MENISECTOMY Right 04/08/2018   Procedure: KNEE ARTHROSCOPY WITH PARTIAL LATERAL MENISECTOMY and debridement.;  Surgeon: Corky Mull, MD;  Location: [place];  Service: Orthopedics;  Laterality: Right;   RENAL ANGIOGRAPHY N/A 10/25/2021   Procedure: RENAL ANGIOGRAPHY;  Surgeon: Algernon Huxley, MD;  Location: [place] [facility];  Service: Cardiovascular;  Laterality: N/A;     Social History   Tobacco Use   Smoking status: Never   Smokeless tobacco: Never   Tobacco comments:    smoked socially in college  Vaping Use   Vaping Use: Never used  Substance Use Topics   Alcohol use: No   Drug use: No      Family History  Problem Relation Age of Onset   Ovarian cancer Mother    Cancer Mother 36       ovarian cancer, treated by [facility]   Heart disease Father    Breast cancer Neg Hx      Allergies  Allergen Reactions   Atorvastatin Other (See Comments)    myalgias   Brompheniramine-Pseudoeph     REACTION: agitation, nervous, sleepless   Codeine     REACTION: GI Upset   Diphenhydramine Hcl     REACTION: nervous, sleepless, agitated   Hctz [Hydrochlorothiazide] Other (See Comments)    Severe hyponatremia   Loratadine     REACTION: nervous, sleeplessness   Other Other (See Comments)    Tape left "burn-type" marks on face after Cataract surgery   Tape     Tape left "burn-type" marks on face after Cataract surgery   Triamterene     Hyperkalemia and hyponatremia   Tramadol Hcl Rash    hyperactivity     REVIEW OF SYSTEMS (Negative unless checked)   Constitutional: '[]'$ Weight loss  '[]'$ Fever  '[]'$ Chills Cardiac: '[]'$ Chest pain   '[]'$ Chest pressure   '[]'$ Palpitations   '[]'$ Shortness of breath when laying flat   '[]'$ Shortness of breath at rest   '[]'$ Shortness of  breath with exertion. Vascular:  '[]'$ Pain in legs with walking   '[]'$ Pain in legs at rest   '[]'$ Pain in legs when laying flat   '[]'$ Claudication   '[]'$ Pain in feet when walking  '[]'$ Pain in feet at rest  '[]'$ Pain in feet when laying flat   '[]'$ History of DVT   '[]'$ Phlebitis   '[]'$ Swelling  in legs   '[]'$ Varicose veins   '[]'$ Non-healing ulcers Pulmonary:   '[]'$ Uses home oxygen   '[]'$ Productive cough   '[]'$ Hemoptysis   '[]'$ Wheeze  '[]'$ COPD   '[]'$ Asthma Neurologic:  '[]'$ Dizziness  '[]'$ Blackouts   '[]'$ Seizures   '[]'$ History of stroke   '[]'$ History of TIA  '[]'$ Aphasia   '[]'$ Temporary blindness   '[]'$ Dysphagia   '[]'$ Weakness or numbness in arms   '[]'$ Weakness or numbness in legs Musculoskeletal:  '[x]'$ Arthritis   '[]'$ Joint swelling   '[]'$ Joint pain   '[x]'$ Low back pain Hematologic:  '[]'$ Easy bruising  '[]'$ Easy bleeding   '[]'$ Hypercoagulable state   '[]'$ Anemic   Gastrointestinal:  '[]'$ Blood in stool   '[]'$ Vomiting blood  '[]'$ Gastroesophageal reflux/heartburn   '[]'$ Abdominal pain Genitourinary:  '[]'$ Chronic kidney disease   '[]'$ Difficult urination  '[]'$ Frequent urination  '[]'$ Burning with urination   '[]'$ Hematuria Skin:  '[]'$ Rashes   '[]'$ Ulcers   '[]'$ Wounds Psychological:  '[x]'$ History of anxiety   '[]'$  History of major depression.  Physical Examination  BP 128/73 (BP Location: Left Arm)   Pulse 83   Resp 16   Wt 134 lb (60.8 kg)   BMI 23.74 kg/m  Gen:  WD/WN, NAD.  Appears younger than stated age Head: [place]/AT, No temporalis wasting. Ear/Nose/Throat: Hearing grossly intact, nares w/o erythema or drainage Eyes: Conjunctiva clear. Sclera non-icteric Neck: Supple.  Trachea midline Pulmonary:  Good air movement, no use of accessory muscles.  Cardiac: RRR, no JVD Vascular:  Vessel Right Left  Radial Palpable Palpable                                   Gastrointestinal: soft, non-tender/non-distended. No guarding/reflex.  Musculoskeletal: M/S 5/5 throughout.  No deformity or atrophy.  No edema. Neurologic: Sensation grossly intact in extremities.  Symmetrical.  Speech is fluent.   Psychiatric: Judgment intact, Mood & affect appropriate for pt's clinical situation. Dermatologic: No rashes or ulcers noted.  No cellulitis or open wounds.      Labs No results found for this or any previous visit (from the past 2160 hour(s)).  Radiology No results found.  Assessment/Plan Renovascular hypertension Blood pressure control is significantly improved after renal artery intervention on the right.  I will defer to her primary care physician whether or not she can reduce the medications    Hyperlipidemia lipid control important in reducing the progression of atherosclerotic disease. Continue statin therapy  Fibromuscular dysplasia of renal artery (HCC) Her duplex today shows no hemodynamically significant stenosis in either renal artery good kidney size is stable from her previous duplex.  Doing well status post intervention with improved blood pressure control.  Defer to primary care provider in terms of medication management.  Follow-up in 6 months with duplex.  No change in medical regimen.    [name], MD  08/16/2022 10:03 AM    This note was created with Dragon medical transcription system.  Any errors from dictation are purely unintentional

## 2022-08-21 NOTE — Telephone Encounter (Signed)
MyChart messgae sent to patient. 

## 2022-09-11 ENCOUNTER — Other Ambulatory Visit: Payer: Self-pay | Admitting: Internal Medicine

## 2022-09-11 NOTE — Telephone Encounter (Signed)
Refilled: 12/25/2021 Last OV: 05/01/2022 Next OV: 11/01/2022

## 2022-10-09 ENCOUNTER — Telehealth (INDEPENDENT_AMBULATORY_CARE_PROVIDER_SITE_OTHER): Payer: Medicare HMO

## 2022-10-09 DIAGNOSIS — H6123 Impacted cerumen, bilateral: Secondary | ICD-10-CM

## 2022-10-09 NOTE — Assessment & Plan Note (Signed)
Irrigation ordered

## 2022-10-09 NOTE — Telephone Encounter (Signed)
Patient states she needs to have an appointment to have her ears irrigated.  Patient states she would like to have this as soon as possible because she needs to have a hearing test and she can't do that until she has the wax cleaned out of her ears.  I let patient know that we will need to have orders for the ear irrigation in order to schedule, so I will send a note to Dr. Deborra Medina.

## 2022-10-09 NOTE — Telephone Encounter (Signed)
Is it okay to schedule pt for a nurse visit to have her ears irrigated or does she need an appt with you first?

## 2022-10-09 NOTE — Telephone Encounter (Signed)
Pt is scheduled for tomorrow at 9:45 for an ear irrigation.

## 2022-10-10 ENCOUNTER — Ambulatory Visit (INDEPENDENT_AMBULATORY_CARE_PROVIDER_SITE_OTHER): Payer: Medicare HMO

## 2022-10-10 DIAGNOSIS — H6123 Impacted cerumen, bilateral: Secondary | ICD-10-CM

## 2022-10-10 NOTE — Progress Notes (Cosign Needed Addendum)
Subjective:    Wendy Love is a 82 y.o. female whom I am asked to see for evaluation of diminished hearing in both ears for the past 3 days. There is not a prior history of cerumen impaction. The patient has not been using ear drops to loosen wax immediately prior to this visit. The patient denies ear pain.  The patient's history has been marked as reviewed and updated as appropriate.  Review of Systems Not done today at nurse visit.     Objective:    Auditory canal(s) of both ears are completely obstructed with cerumen.   Cerumen was removed using gentle irrigation. Tympanic membranes are intact following the procedure.  Auditory canals are normal.    Assessment:    Cerumen Impaction without otitis externa.    Plan:    1. Care instructions given. 2. Home treatment: none. 3. Follow-up as needed.      Duan Scharnhorst,cma     I have reviewed the above information and agree with above.   Deborra Medina, MD

## 2022-11-01 ENCOUNTER — Encounter: Payer: Self-pay | Admitting: Internal Medicine

## 2022-11-01 ENCOUNTER — Ambulatory Visit (INDEPENDENT_AMBULATORY_CARE_PROVIDER_SITE_OTHER): Payer: Medicare HMO | Admitting: Internal Medicine

## 2022-11-01 VITALS — BP 140/82 | HR 79 | Temp 97.8°F | Ht 63.0 in | Wt 137.6 lb

## 2022-11-01 DIAGNOSIS — E7800 Pure hypercholesterolemia, unspecified: Secondary | ICD-10-CM | POA: Diagnosis not present

## 2022-11-01 DIAGNOSIS — M8588 Other specified disorders of bone density and structure, other site: Secondary | ICD-10-CM | POA: Diagnosis not present

## 2022-11-01 DIAGNOSIS — G4700 Insomnia, unspecified: Secondary | ICD-10-CM | POA: Diagnosis not present

## 2022-11-01 DIAGNOSIS — I10 Essential (primary) hypertension: Secondary | ICD-10-CM | POA: Diagnosis not present

## 2022-11-01 DIAGNOSIS — I150 Renovascular hypertension: Secondary | ICD-10-CM

## 2022-11-01 DIAGNOSIS — E034 Atrophy of thyroid (acquired): Secondary | ICD-10-CM | POA: Diagnosis not present

## 2022-11-01 DIAGNOSIS — I773 Arterial fibromuscular dysplasia: Secondary | ICD-10-CM

## 2022-11-01 DIAGNOSIS — Z79899 Other long term (current) drug therapy: Secondary | ICD-10-CM | POA: Diagnosis not present

## 2022-11-01 DIAGNOSIS — R7301 Impaired fasting glucose: Secondary | ICD-10-CM

## 2022-11-01 LAB — CBC WITH DIFFERENTIAL/PLATELET
Basophils Absolute: 0 10*3/uL (ref 0.0–0.1)
Basophils Relative: 0.7 % (ref 0.0–3.0)
Eosinophils Absolute: 0.2 10*3/uL (ref 0.0–0.7)
Eosinophils Relative: 3.5 % (ref 0.0–5.0)
HCT: 43 % (ref 36.0–46.0)
Hemoglobin: 14.8 g/dL (ref 12.0–15.0)
Lymphocytes Relative: 30 % (ref 12.0–46.0)
Lymphs Abs: 1.8 10*3/uL (ref 0.7–4.0)
MCHC: 34.4 g/dL (ref 30.0–36.0)
MCV: 95.6 fl (ref 78.0–100.0)
Monocytes Absolute: 0.6 10*3/uL (ref 0.1–1.0)
Monocytes Relative: 9.9 % (ref 3.0–12.0)
Neutro Abs: 3.4 10*3/uL (ref 1.4–7.7)
Neutrophils Relative %: 55.9 % (ref 43.0–77.0)
Platelets: 260 10*3/uL (ref 150.0–400.0)
RBC: 4.5 Mil/uL (ref 3.87–5.11)
RDW: 12.9 % (ref 11.5–15.5)
WBC: 6 10*3/uL (ref 4.0–10.5)

## 2022-11-01 LAB — LIPID PANEL
Cholesterol: 243 mg/dL — ABNORMAL HIGH (ref 0–200)
HDL: 94.9 mg/dL (ref >39.00)
LDL Cholesterol: 126 mg/dL — ABNORMAL HIGH (ref 0–99)
NonHDL: 147.96
Total CHOL/HDL Ratio: 3
Triglycerides: 108 mg/dL (ref 0.0–149.0)
VLDL: 21.6 mg/dL (ref 0.0–40.0)

## 2022-11-01 LAB — LDL CHOLESTEROL, DIRECT: Direct LDL: 135 mg/dL

## 2022-11-01 LAB — COMPREHENSIVE METABOLIC PANEL
ALT: 19 U/L (ref 0–35)
AST: 26 U/L (ref 0–37)
Albumin: 4.3 g/dL (ref 3.5–5.2)
Alkaline Phosphatase: 70 U/L (ref 39–117)
BUN: 12 mg/dL (ref 6–23)
CO2: 29 mEq/L (ref 19–32)
Calcium: 9.8 mg/dL (ref 8.4–10.5)
Chloride: 103 mEq/L (ref 96–112)
Creatinine, Ser: 0.63 mg/dL (ref 0.40–1.20)
GFR: 82.6 mL/min (ref >60.00)
Glucose, Bld: 84 mg/dL (ref 70–99)
Potassium: 4.3 mEq/L (ref 3.5–5.1)
Sodium: 139 mEq/L (ref 135–145)
Total Bilirubin: 0.5 mg/dL (ref 0.2–1.2)
Total Protein: 7.1 g/dL (ref 6.0–8.3)

## 2022-11-01 LAB — MICROALBUMIN / CREATININE URINE RATIO
Creatinine,U: 20.8 mg/dL
Microalb Creat Ratio: 3.4 mg/g (ref 0.0–30.0)
Microalb, Ur: <0.7 mg/dL (ref 0.0–1.9)

## 2022-11-01 LAB — HEMOGLOBIN A1C: Hgb A1c MFr Bld: 5.3 % (ref 4.6–6.5)

## 2022-11-01 LAB — TSH: TSH: 1.89 u[IU]/mL (ref 0.35–5.50)

## 2022-11-01 MED ORDER — LEVOTHYROXINE SODIUM 75 MCG PO TABS: 75.0000 ug | ORAL_TABLET | Freq: Every day | ORAL | 0 refills | Status: DC

## 2022-11-01 MED ORDER — AMLODIPINE BESYLATE 5 MG PO TABS: 5.0000 mg | ORAL_TABLET | Freq: Every day | ORAL | 1 refills | Status: DC

## 2022-11-01 MED ORDER — LOSARTAN POTASSIUM 50 MG PO TABS: 100.0000 mg | ORAL_TABLET | Freq: Every day | ORAL | 1 refills | Status: DC

## 2022-11-01 NOTE — Progress Notes (Unsigned)
Subjective:  Patient ID: Wendy Love, female    DOB: 12-Jun-1941  Age: 82 y.o. MRN: [id number]  CC: The primary encounter diagnosis was Hypothyroidism due to acquired atrophy of thyroid. Diagnoses of Essential hypertension, Impaired fasting glucose, Pure hypercholesterolemia, and Long-term use of high-risk medication were also pertinent to this visit.   HPI Wendy Love presents for  Chief Complaint  Patient presents with   Medical Management of Chronic Issues    6 month follow up    1) HTN:  home readings 140/82 yesterday. Taking coreg 3.125 mg bid and amlodipine 5,  losartan 100 mg daily  Currently suffering from sinus headache and had coffee   2) HH:  iron and CBC due ,  deferred hematology referral back in 2019.  Does not donate blood    3) Stress:  sister is 51 and undergoing lumpectomy for triple negative  BRCA   4) tender area on hard palate for a few days , several weeks ago,  still feels raised      Outpatient Medications Prior to Visit  Medication Sig Dispense Refill   amLODipine (NORVASC) 5 MG tablet TAKE 1 TABLET BY MOUTH EVERY DAY 90 tablet 1   aspirin EC 81 MG tablet Take 1 tablet (81 mg total) by mouth daily. 150 tablet 2   b complex vitamins tablet Take 1 tablet by mouth daily.     CALCIUM-MAGNESIUM PO Take by mouth.     carvedilol (COREG) 3.125 MG tablet TAKE 1 TABLET BY MOUTH TWICE A DAY WITH A MEAL 180 tablet 3   Cholecalciferol (VITAMIN D3) 2000 UNITS TABS Take 5,000 Units by mouth daily.     Coenzyme Q10 (CO Q-10) 100 MG CAPS Take by mouth daily.     levothyroxine (SYNTHROID) 75 MCG tablet TAKE 1 TABLET BY MOUTH EVERY DAY 90 tablet 0   losartan (COZAAR) 50 MG tablet TAKE 2 TABLETS BY MOUTH EVERY DAY 180 tablet 1   Magnesium 300 MG CAPS Take by mouth daily.     melatonin 1 MG TABS tablet Take 2 mg by mouth at bedtime.     Multiple Vitamin (MULTIVITAMIN) tablet Take 1 tablet by mouth daily.     Omega-3 Fatty Acids (FISH OIL PO) Take by mouth  daily.     PROBIOTIC CAPS Take by mouth daily.     QUERCETIN PO Take 560 mg by mouth daily.     temazepam (RESTORIL) 15 MG capsule TAKE 1 CAPSULE AT BEDTIME 30 capsule 2   tretinoin (RETIN-A) 0.025 % cream APPLY TO WHOLE FACE EACH NIGHT.  5   vitamin C (ASCORBIC ACID) 500 MG tablet Take 1,000 mg by mouth daily.      Zinc 50 MG TABS Take 50 mg by mouth daily.     ALPRAZolam (XANAX) 0.25 MG tablet Take 0.25 mg by mouth at bedtime as needed for anxiety. (Patient not taking: Reported on 11/01/2022)     carvedilol (COREG) 6.25 MG tablet Take 1 tablet (6.25 mg total) by mouth 2 (two) times daily with a meal. (Patient not taking: Reported on 11/01/2022) 180 tablet 3   No facility-administered medications prior to visit.    Review of Systems;  Patient denies headache, fevers, malaise, unintentional weight loss, skin rash, eye pain, sinus congestion and sinus pain, sore throat, dysphagia,  hemoptysis , cough, dyspnea, wheezing, chest pain, palpitations, orthopnea, edema, abdominal pain, nausea, melena, diarrhea, constipation, flank pain, dysuria, hematuria, urinary  Frequency, nocturia, numbness, tingling, seizures,  Focal weakness, Loss  of consciousness,  Tremor, insomnia, depression, anxiety, and suicidal ideation.      Objective:  BP (!) 140/82   Pulse 79   Temp 97.8 F (36.6 C) (Oral)   Ht '5\' 3"'$  (1.6 m)   Wt 137 lb 9.6 oz (62.4 kg)   SpO2 96%   BMI 24.37 kg/m   BP Readings from Last 3 Encounters:  11/01/22 (!) 140/82  08/16/22 128/73  05/01/22 130/82    Wt Readings from Last 3 Encounters:  11/01/22 137 lb 9.6 oz (62.4 kg)  08/16/22 134 lb (60.8 kg)  05/01/22 133 lb (60.3 kg)    Physical Exam  Lab Results  Component Value Date   HGBA1C 5.4 05/01/2022   HGBA1C 5.2 09/16/2019   HGBA1C 5.4 02/25/2019    Lab Results  Component Value Date   CREATININE 0.65 05/01/2022   CREATININE 0.60 11/09/2021   CREATININE 0.60 11/01/2021    Lab Results  Component Value Date   WBC 6.9  04/11/2021   HGB 14.1 04/11/2021   HCT 41.2 04/11/2021   PLT 258.0 04/11/2021   GLUCOSE 90 05/01/2022   CHOL 246 (H) 05/01/2022   TRIG 87.0 05/01/2022   HDL 95.10 05/01/2022   LDLDIRECT 115.0 08/12/2018   LDLCALC 134 (H) 05/01/2022   ALT 22 05/01/2022   AST 26 05/01/2022   NA 138 05/01/2022   K 4.5 05/01/2022   CL 98 05/01/2022   CREATININE 0.65 05/01/2022   BUN 17 05/01/2022   CO2 31 05/01/2022   TSH 0.78 05/01/2022   INR 1.0 04/11/2021   HGBA1C 5.4 05/01/2022   MICROALBUR <0.7 04/11/2020    MM 3D SCREEN BREAST BILATERAL  Result Date: 05/27/2022 CLINICAL DATA:  Screening. EXAM: DIGITAL SCREENING BILATERAL MAMMOGRAM WITH TOMOSYNTHESIS AND CAD TECHNIQUE: Bilateral screening digital craniocaudal and mediolateral oblique mammograms were obtained. Bilateral screening digital breast tomosynthesis was performed. The images were evaluated with computer-aided detection. COMPARISON:  Previous exam(s). ACR Breast Density Category c: The breast tissue is heterogeneously dense, which may obscure small masses. FINDINGS: There are no findings suspicious for malignancy. IMPRESSION: No mammographic evidence of malignancy. A result letter of this screening mammogram will be mailed directly to the patient. RECOMMENDATION: Screening mammogram in one year. (Code:[id number]-B-01Y) BI-RADS CATEGORY  1: Negative. Electronically Signed   By: Kristopher Oppenheim M.D.   On: 05/27/2022 14:31    Assessment & Plan:  .Hypothyroidism due to acquired atrophy of thyroid  Essential hypertension  Impaired fasting glucose  Pure hypercholesterolemia  Long-term use of high-risk medication     I provided 30 minutes of face-to-face time during this encounter reviewing patient's last visit with me, patient's  most recent visit with cardiology,  nephrology,  and neurology,  recent surgical and non surgical procedures, previous  labs and imaging studies, counseling on currently addressed issues,  and post visit ordering to  diagnostics and therapeutics .   Follow-up: No follow-ups on file.   Crecencio Mc, MD

## 2022-11-01 NOTE — Assessment & Plan Note (Signed)
Managed with 5 mg restoril (patient opens the capsules and weighs the contents)  2 mg melatonin. And GABA   No changes today

## 2022-11-01 NOTE — Assessment & Plan Note (Signed)
Her duplex was repeated in DEc 2023 by Dr Lucky Cowboy and showed  no hemodynamically significant stenosis in either renal artery;  good kidney size is stable from her previous duplex.

## 2022-11-01 NOTE — Patient Instructions (Addendum)
   You might want to try using Relaxium for insomnia  (as seen on TV commercials) . It contains:  Melatonin 5 mg  Chamomile 25 mg Passionflower extract 75 mg GABA 100 mg Ashwaganda extract 125 mg Magnesium citrate, glycinate, oxide (100 mg)  L tryptophan 500 mg Valerest (proprietary  ingredient ; probably valeria root extract)    You can   take  up to 2000 mg of acetominophen (tylenol) every day safely  In divided doses (500 mg every 6 hours  Or 1000 mg every 12 hours.)

## 2022-11-01 NOTE — Assessment & Plan Note (Addendum)
  Repeat iron studies are due  Lab Results  Component Value Date   IRON 171 (H) 09/23/2018   TIBC 271 09/23/2018   FERRITIN 52 09/23/2018   Lab Results  Component Value Date   WBC 6.9 04/11/2021   HGB 14.1 04/11/2021   HCT 41.2 04/11/2021   MCV 96.9 04/11/2021   PLT 258.0 04/11/2021

## 2022-11-02 LAB — IRON,TIBC AND FERRITIN PANEL
%SAT: 51 % (calc) — ABNORMAL HIGH (ref 16–45)
Ferritin: 55 ng/mL (ref 16–288)
Iron: 142 ug/dL (ref 45–160)
TIBC: 280 mcg/dL (calc) (ref 250–450)

## 2022-11-03 DIAGNOSIS — M8588 Other specified disorders of bone density and structure, other site: Secondary | ICD-10-CM | POA: Insufficient documentation

## 2022-11-03 NOTE — Assessment & Plan Note (Signed)
Blood pressure control is significantly improved after renal artery intervention on the right. Continue current regimen of amlodipine losartan and carvedilol

## 2022-11-03 NOTE — Assessment & Plan Note (Signed)
Etiology unclear.  May have resulted from burning roof of mouth on something hot. Advised to request ENT evaluation if still present in one week

## 2022-12-17 ENCOUNTER — Ambulatory Visit (INDEPENDENT_AMBULATORY_CARE_PROVIDER_SITE_OTHER): Payer: Medicare HMO | Admitting: Nurse Practitioner

## 2022-12-17 ENCOUNTER — Encounter: Payer: Self-pay | Admitting: Nurse Practitioner

## 2022-12-17 VITALS — BP 132/72 | HR 77 | Temp 97.7°F | Ht 63.0 in | Wt 136.0 lb

## 2022-12-17 DIAGNOSIS — R2231 Localized swelling, mass and lump, right upper limb: Secondary | ICD-10-CM | POA: Diagnosis not present

## 2022-12-17 NOTE — Progress Notes (Signed)
Established Patient Office Visit  Subjective:  Patient ID: Wendy Love, female    DOB: 07-16-1941  Age: 82 y.o. MRN: [id number]  CC:  Chief Complaint  Patient presents with   knot on arm    Right arm    HPI  Emnet W Judeen HammansO'Reilly presents for knot on the right arm since a week. The knot is tender to touch. Has not injured it. She also has a bruise beside that area, that is fading away and she do not know how she got it.   She has history of autosomal dominant hereditary hemochromatosis.  She deferred hematology consult in 2019. She is interested in donating blood now.  HPI   Past Medical History:  Diagnosis Date   Anxiety    Bronchitis    Cancer    squamous cell   Chest pain    a. 09/2010 Cor CTA: Nl cors. EF 56%.   Claudication    a. 11/2021 ABIs: R 1.09, L 1.01.   COVID-19 09/01/21   home test    Degenerative disc disease, lumbar    Depression    Endometriosis 1974   s/p abdominal surgery, 2nd surgery   Fibromuscular dysplasia    High cholesterol    Hypertension    Hypothyroidism    Rhinosinusitis    Right renal artery stenosis    a. 10/2021 s/p PTA/DCBA R renal artery; b. 11/2021 Renal duplex: 1-59% bilat RA stenoses.   Sciatica     Past Surgical History:  Procedure Laterality Date   ABDOMINAL HYSTERECTOMY  2000   APPENDECTOMY     CARPAL TUNNEL RELEASE     bilateral   CATARACT EXTRACTION W/PHACO Left 07/26/2020   Procedure: CATARACT EXTRACTION PHACO AND INTRAOCULAR LENS PLACEMENT (IOC) LEFT 6.30 01:10.2 9.0%;  Surgeon: Lockie MolaBrasington, Chadwick, MD;  [facility];  Service: Ophthalmology;  Laterality: Left;   CATARACT EXTRACTION W/PHACO Right 10/18/2020   Procedure: CATARACT EXTRACTION PHACO AND INTRAOCULAR LENS PLACEMENT (IOC) RIGHT 9.18 01:14.6 12.3%;  Surgeon: Lockie MolaBrasington, Chadwick, MD;  [facility];  Service: Ophthalmology;  Laterality: Right;   Endoscopy/colonoscopy  April 2012   Schatski's Ring , small hiatal hernia    hemilaminectomy  April 2010   L3-L4hemi, L4-L5 microdiskectomy   KNEE ARTHROSCOPY WITH LATERAL MENISECTOMY Right 04/08/2018   Procedure: KNEE ARTHROSCOPY WITH PARTIAL LATERAL MENISECTOMY and debridement.;  Surgeon: Christena FlakePoggi, John J, MD;  [facility];  Service: Orthopedics;  Laterality: Right;   RENAL ANGIOGRAPHY N/A 10/25/2021   Procedure: RENAL ANGIOGRAPHY;  Surgeon: Annice Needyew, Jason S, MD;  Location: [facility];  Service: Cardiovascular;  Laterality: N/A;    Family History  Problem Relation Age of Onset   Ovarian cancer Mother    Cancer Mother [id number]       ovarian cancer, treated by [facility]   Heart disease Father    Breast cancer Neg Hx     Social History   Socioeconomic History   Marital status: Widowed    Spouse name: Not on file   Number of children: Not on file   Years of education: Not on file   Highest education level: Not on file  Occupational History   Not on file  Tobacco Use   Smoking status: Never   Smokeless tobacco: Never   Tobacco comments:    smoked socially in college  Vaping Use   Vaping Use: Never used  Substance and Sexual Activity   Alcohol use: No   Drug use: No  Sexual activity: Not Currently  Other Topics Concern   Not on file  Social History Narrative   Not on file   Social Determinants of Health   Financial Resource Strain: Low Risk  (02/22/2022)   Overall Financial Resource Strain (CARDIA)    Difficulty of Paying Living Expenses: Not hard at all  Food Insecurity: No Food Insecurity (02/22/2022)   Hunger Vital Sign    Worried About Running Out of Food in the Last Year: Never true    Ran Out of Food in the Last Year: Never true  Transportation Needs: No Transportation Needs (02/22/2022)   PRAPARE - Administrator, Civil Service (Medical): No    Lack of Transportation (Non-Medical): No  Physical Activity: Sufficiently Active (02/22/2022)   Exercise Vital Sign    Days of Exercise per Week: 5 days    Minutes of  Exercise per Session: 30 min  Stress: No Stress Concern Present (02/22/2022)   [employer] of Occupational Health - Occupational Stress Questionnaire    Feeling of Stress : Not at all  Social Connections: Unknown (02/22/2022)   Social Connection and Isolation Panel [NHANES]    Frequency of Communication with Friends and Family: More than three times a week    Frequency of Social Gatherings with Friends and Family: More than three times a week    Attends Religious Services: Not on file    Active Member of Clubs or Organizations: Not on file    Attends Banker Meetings: Not on file    Marital Status: Widowed  Intimate Partner Violence: Not At Risk (02/22/2022)   Humiliation, Afraid, Rape, and Kick questionnaire    Fear of Current or Ex-Partner: No    Emotionally Abused: No    Physically Abused: No    Sexually Abused: No     Outpatient Medications Prior to Visit  Medication Sig Dispense Refill   amLODipine (NORVASC) 5 MG tablet Take 1 tablet (5 mg total) by mouth daily. 90 tablet 1   aspirin EC 81 MG tablet Take 1 tablet (81 mg total) by mouth daily. 150 tablet 2   b complex vitamins tablet Take 1 tablet by mouth daily.     CALCIUM-MAGNESIUM PO Take by mouth.     carvedilol (COREG) 3.125 MG tablet TAKE 1 TABLET BY MOUTH TWICE A DAY WITH A MEAL 180 tablet 3   Cholecalciferol (VITAMIN D3) 2000 UNITS TABS Take 5,000 Units by mouth daily.     Coenzyme Q10 (CO Q-10) 100 MG CAPS Take by mouth daily.     levothyroxine (SYNTHROID) 75 MCG tablet Take 1 tablet (75 mcg total) by mouth daily. 90 tablet 0   losartan (COZAAR) 50 MG tablet Take 2 tablets (100 mg total) by mouth daily. 180 tablet 1   Magnesium 300 MG CAPS Take by mouth daily.     melatonin 1 MG TABS tablet Take 2 mg by mouth at bedtime.     Multiple Vitamin (MULTIVITAMIN) tablet Take 1 tablet by mouth daily.     Omega-3 Fatty Acids (FISH OIL PO) Take by mouth daily.     PROBIOTIC CAPS Take by mouth daily.      QUERCETIN PO Take 560 mg by mouth daily.     temazepam (RESTORIL) 15 MG capsule TAKE 1 CAPSULE AT BEDTIME 30 capsule 2   tretinoin (RETIN-A) 0.025 % cream APPLY TO WHOLE FACE EACH NIGHT.  5   vitamin C (ASCORBIC ACID) 500 MG tablet Take 1,000 mg by mouth daily.  Zinc 50 MG TABS Take 50 mg by mouth daily.     ALPRAZolam (XANAX) 0.25 MG tablet Take 0.25 mg by mouth at bedtime as needed for anxiety. (Patient not taking: Reported on 11/01/2022)     No facility-administered medications prior to visit.    Allergies  Allergen Reactions   Atorvastatin Other (See Comments)    myalgias   Brompheniramine-Pseudoeph     REACTION: agitation, nervous, sleepless   Codeine     REACTION: GI Upset   Diphenhydramine Hcl     REACTION: nervous, sleepless, agitated   Hctz [Hydrochlorothiazide] Other (See Comments)    Severe hyponatremia   Loratadine     REACTION: nervous, sleeplessness   Other Other (See Comments)    Tape left "burn-type" marks on face after Cataract surgery   Tape     Tape left "burn-type" marks on face after Cataract surgery   Triamterene     Hyperkalemia and hyponatremia   Tramadol Hcl Rash    hyperactivity    ROS Review of Systems  Constitutional: Negative.   HENT: Negative.    Respiratory: Negative.    Cardiovascular:  Negative for leg swelling.  Musculoskeletal:        Knot on the right arm      Objective:    Physical Exam Constitutional:      Appearance: Normal appearance.  Musculoskeletal:       Arms:     Comments: Tender knot to the wrist as in the picture and bruising around.     BP 132/72   Pulse 77   Temp 97.7 F (36.5 C) (Oral)   Ht 5\' 3"  (1.6 m)   Wt 136 lb (61.7 kg)   SpO2 98%   BMI 24.09 kg/m  Wt Readings from Last 3 Encounters:  12/17/22 136 lb (61.7 kg)  11/01/22 137 lb 9.6 oz (62.4 kg)  08/16/22 134 lb (60.8 kg)     Health Maintenance  Topic Date Due   Medicare Annual Wellness (AWV)  02/23/2023   DTaP/Tdap/Td (3 - Tdap)  02/26/2023   INFLUENZA VACCINE  04/10/2023   MAMMOGRAM  05/25/2023   DEXA SCAN  03/08/2024   Pneumonia Vaccine 10+ Years old  Completed   HPV VACCINES  Aged Out   COVID-19 Vaccine  Discontinued   Zoster Vaccines- Shingrix  Discontinued    There are no preventive care reminders to display for this patient.  Lab Results  Component Value Date   TSH 1.89 11/01/2022   Lab Results  Component Value Date   WBC 6.0 11/01/2022   HGB 14.8 11/01/2022   HCT 43.0 11/01/2022   MCV 95.6 11/01/2022   PLT 260.0 11/01/2022   Lab Results  Component Value Date   NA 139 11/01/2022   K 4.3 11/01/2022   CO2 29 11/01/2022   GLUCOSE 84 11/01/2022   BUN 12 11/01/2022   CREATININE 0.63 11/01/2022   BILITOT 0.5 11/01/2022   ALKPHOS 70 11/01/2022   AST 26 11/01/2022   ALT 19 11/01/2022   PROT 7.1 11/01/2022   ALBUMIN 4.3 11/01/2022   CALCIUM 9.8 11/01/2022   ANIONGAP 15 09/30/2011   GFR 82.60 11/01/2022   Lab Results  Component Value Date   CHOL 243 (H) 11/01/2022   Lab Results  Component Value Date   HDL 94.90 11/01/2022   Lab Results  Component Value Date   LDLCALC 126 (H) 11/01/2022   Lab Results  Component Value Date   TRIG 108.0 11/01/2022   Lab Results  Component Value Date   CHOLHDL 3 11/01/2022   Lab Results  Component Value Date   HGBA1C 5.3 11/01/2022      Assessment & Plan:  Lump of skin of right upper extremity Assessment & Plan: Advised to alternate hot and cold pack. If symptoms not improving or worsening please call the office for further evaluation     Follow-up: Return if symptoms worsen or fail to improve.   Kara Diesharanpreet  Elyse Prevo, NP

## 2022-12-17 NOTE — Patient Instructions (Addendum)
Advised to use warm compress to the knot on the hand.  If s/s does not improves please call the office for further evaluation.

## 2022-12-23 DIAGNOSIS — R2231 Localized swelling, mass and lump, right upper limb: Secondary | ICD-10-CM | POA: Insufficient documentation

## 2022-12-23 NOTE — Assessment & Plan Note (Signed)
Advised to alternate hot and cold pack. If symptoms not improving or worsening please call the office for further evaluation

## 2023-01-04 ENCOUNTER — Other Ambulatory Visit (INDEPENDENT_AMBULATORY_CARE_PROVIDER_SITE_OTHER): Payer: Self-pay | Admitting: Vascular Surgery

## 2023-01-10 ENCOUNTER — Telehealth: Payer: Self-pay | Admitting: Internal Medicine

## 2023-01-10 NOTE — Telephone Encounter (Signed)
Copied from CRM [id number]. Topic: Medicare AWV >> Jan 10, 2023  3:38 PM Rushie Goltz wrote: Reason for CRM: Called patient to schedule Medicare Annual Wellness Visit (AWV). Left message for patient to call back and schedule Medicare Annual Wellness Visit (AWV).  Last date of AWV: 02/22/2022  Please schedule an AWVS appointment at any time with [facility] VISIT.  If any questions, please contact me at [phone].    Thank you,  [facility] Support [facility] Direct dial  [phone]

## 2023-01-20 ENCOUNTER — Encounter: Payer: Self-pay | Admitting: Internal Medicine

## 2023-01-28 DIAGNOSIS — H40003 Preglaucoma, unspecified, bilateral: Secondary | ICD-10-CM | POA: Diagnosis not present

## 2023-01-28 DIAGNOSIS — Z961 Presence of intraocular lens: Secondary | ICD-10-CM | POA: Diagnosis not present

## 2023-01-29 DIAGNOSIS — D225 Melanocytic nevi of trunk: Secondary | ICD-10-CM | POA: Diagnosis not present

## 2023-01-29 DIAGNOSIS — Z85828 Personal history of other malignant neoplasm of skin: Secondary | ICD-10-CM | POA: Diagnosis not present

## 2023-01-29 DIAGNOSIS — D2262 Melanocytic nevi of left upper limb, including shoulder: Secondary | ICD-10-CM | POA: Diagnosis not present

## 2023-01-29 DIAGNOSIS — Z08 Encounter for follow-up examination after completed treatment for malignant neoplasm: Secondary | ICD-10-CM | POA: Diagnosis not present

## 2023-01-29 DIAGNOSIS — L821 Other seborrheic keratosis: Secondary | ICD-10-CM | POA: Diagnosis not present

## 2023-01-29 DIAGNOSIS — D2272 Melanocytic nevi of left lower limb, including hip: Secondary | ICD-10-CM | POA: Diagnosis not present

## 2023-01-29 DIAGNOSIS — L578 Other skin changes due to chronic exposure to nonionizing radiation: Secondary | ICD-10-CM | POA: Diagnosis not present

## 2023-01-29 DIAGNOSIS — D2271 Melanocytic nevi of right lower limb, including hip: Secondary | ICD-10-CM | POA: Diagnosis not present

## 2023-01-29 DIAGNOSIS — D2261 Melanocytic nevi of right upper limb, including shoulder: Secondary | ICD-10-CM | POA: Diagnosis not present

## 2023-02-03 ENCOUNTER — Other Ambulatory Visit: Payer: Self-pay | Admitting: Internal Medicine

## 2023-02-11 ENCOUNTER — Ambulatory Visit (INDEPENDENT_AMBULATORY_CARE_PROVIDER_SITE_OTHER): Payer: Medicare HMO

## 2023-02-11 ENCOUNTER — Ambulatory Visit (INDEPENDENT_AMBULATORY_CARE_PROVIDER_SITE_OTHER): Payer: Medicare HMO | Admitting: Vascular Surgery

## 2023-02-11 DIAGNOSIS — I773 Arterial fibromuscular dysplasia: Secondary | ICD-10-CM | POA: Diagnosis not present

## 2023-02-18 ENCOUNTER — Ambulatory Visit (INDEPENDENT_AMBULATORY_CARE_PROVIDER_SITE_OTHER): Payer: Medicare HMO | Admitting: Vascular Surgery

## 2023-02-18 ENCOUNTER — Encounter (INDEPENDENT_AMBULATORY_CARE_PROVIDER_SITE_OTHER): Payer: Self-pay | Admitting: Vascular Surgery

## 2023-02-18 VITALS — BP 187/95 | HR 76 | Resp 16 | Wt 137.4 lb

## 2023-02-18 DIAGNOSIS — I773 Arterial fibromuscular dysplasia: Secondary | ICD-10-CM | POA: Diagnosis not present

## 2023-02-18 DIAGNOSIS — I150 Renovascular hypertension: Secondary | ICD-10-CM

## 2023-02-18 DIAGNOSIS — E7800 Pure hypercholesterolemia, unspecified: Secondary | ICD-10-CM

## 2023-02-18 NOTE — Progress Notes (Signed)
MRN : [id number]  Wendy Love is a 82 y.o. ([birth year]) female who presents with chief complaint of  Chief Complaint  Patient presents with   Follow-up    Ultrasound results  .  History of Present Illness: Patient returns today in follow up of her fibromuscular dysplasia and renovascular hypertension.  She underwent intervention for this early last year with significant improvement in her blood pressure control.  She was able to half the dose of her 2 blood pressure medications.  She denies any major problems or issues since her last visit.  Her renal artery duplex performed last week showed normal velocities in both renal arteries status post angioplasty of the right renal artery in the past.  Did not appear to be any hemodynamically significant stenosis at current.  Current Outpatient Medications  Medication Sig Dispense Refill   amLODipine (NORVASC) 5 MG tablet Take 1 tablet (5 mg total) by mouth daily. 90 tablet 1   ASPIRIN LOW DOSE 81 MG tablet TAKE 1 TABLET BY MOUTH EVERY DAY 120 tablet 3   b complex vitamins tablet Take 1 tablet by mouth daily.     CALCIUM-MAGNESIUM PO Take by mouth.     carvedilol (COREG) 3.125 MG tablet TAKE 1 TABLET BY MOUTH TWICE A DAY WITH A MEAL 180 tablet 3   Cholecalciferol (VITAMIN D3) 2000 UNITS TABS Take 5,000 Units by mouth daily.     Coenzyme Q10 (CO Q-10) 100 MG CAPS Take by mouth daily.     levothyroxine (SYNTHROID) 75 MCG tablet TAKE 1 TABLET BY MOUTH EVERY DAY 90 tablet 0   losartan (COZAAR) 50 MG tablet Take 2 tablets (100 mg total) by mouth daily. 180 tablet 1   Magnesium 300 MG CAPS Take by mouth daily.     melatonin 1 MG TABS tablet Take 2 mg by mouth at bedtime.     Multiple Vitamin (MULTIVITAMIN) tablet Take 1 tablet by mouth daily.     Omega-3 Fatty Acids (FISH OIL PO) Take by mouth daily.     PROBIOTIC CAPS Take by mouth daily.     QUERCETIN PO Take 560 mg by mouth daily.     temazepam (RESTORIL) 15 MG capsule TAKE 1 CAPSULE AT  BEDTIME 30 capsule 2   tretinoin (RETIN-A) 0.025 % cream APPLY TO WHOLE FACE EACH NIGHT.  5   vitamin C (ASCORBIC ACID) 500 MG tablet Take 1,000 mg by mouth daily.      Zinc 50 MG TABS Take 50 mg by mouth daily.     ALPRAZolam (XANAX) 0.25 MG tablet Take 0.25 mg by mouth at bedtime as needed for anxiety. (Patient not taking: Reported on 11/01/2022)     No current facility-administered medications for this visit.    Past Medical History:  Diagnosis Date   Anxiety    Bronchitis    Cancer (HCC)    squamous cell   Chest pain    a. 09/2010 Cor CTA: Nl cors. EF 56%.   Claudication [facility])    a. 11/2021 ABIs: R 1.09, L 1.01.   COVID-19 09/01/21   home test    Degenerative disc disease, lumbar    Depression    Endometriosis 1974   s/p abdominal surgery, 2nd surgery   Fibromuscular dysplasia (HCC)    High cholesterol    Hypertension    Hypothyroidism    Rhinosinusitis    Right renal artery stenosis (HCC)    a. 10/2021 s/p PTA/DCBA R renal artery; b. 11/2021 Renal duplex:  1-59% bilat RA stenoses.   Sciatica     Past Surgical History:  Procedure Laterality Date   ABDOMINAL HYSTERECTOMY  2000   APPENDECTOMY     CARPAL TUNNEL RELEASE     bilateral   CATARACT EXTRACTION W/PHACO Left 07/26/2020   Procedure: CATARACT EXTRACTION PHACO AND INTRAOCULAR LENS PLACEMENT (IOC) LEFT 6.30 01:10.2 9.0%;  Surgeon: Lockie Mola, MD;  Location: [facility];  Service: Ophthalmology;  Laterality: Left;   CATARACT EXTRACTION W/PHACO Right 10/18/2020   Procedure: CATARACT EXTRACTION PHACO AND INTRAOCULAR LENS PLACEMENT (IOC) RIGHT 9.18 01:14.6 12.3%;  Surgeon: Lockie Mola, MD;  Location: [facility];  Service: Ophthalmology;  Laterality: Right;   Endoscopy/colonoscopy  April 2012   Schatski's Ring , small hiatal hernia   hemilaminectomy  April 2010   L3-L4hemi, L4-L5 microdiskectomy   KNEE ARTHROSCOPY WITH LATERAL MENISECTOMY Right 04/08/2018   Procedure: KNEE ARTHROSCOPY WITH  PARTIAL LATERAL MENISECTOMY and debridement.;  Surgeon: Christena Flake, MD;  Location: [facility];  Service: Orthopedics;  Laterality: Right;   RENAL ANGIOGRAPHY N/A 10/25/2021   Procedure: RENAL ANGIOGRAPHY;  Surgeon: Annice Needy, MD;  Location: [facility];  Service: Cardiovascular;  Laterality: N/A;     Social History   Tobacco Use   Smoking status: Never   Smokeless tobacco: Never   Tobacco comments:    smoked socially in college  Vaping Use   Vaping Use: Never used  Substance Use Topics   Alcohol use: No   Drug use: No       Family History  Problem Relation Age of Onset   Ovarian cancer Mother    Cancer Mother 56       ovarian cancer, treated by [facility]   Heart disease Father    Breast cancer Neg Hx      Allergies  Allergen Reactions   Atorvastatin Other (See Comments)    myalgias   Brompheniramine-Pseudoeph     REACTION: agitation, nervous, sleepless   Codeine     REACTION: GI Upset   Diphenhydramine Hcl     REACTION: nervous, sleepless, agitated   Hctz [Hydrochlorothiazide] Other (See Comments)    Severe hyponatremia   Loratadine     REACTION: nervous, sleeplessness   Other Other (See Comments)    Tape left "burn-type" marks on face after Cataract surgery   Tape     Tape left "burn-type" marks on face after Cataract surgery   Triamterene     Hyperkalemia and hyponatremia   Tramadol Hcl Rash    hyperactivity     REVIEW OF SYSTEMS (Negative unless checked)  Constitutional: [] Weight loss  [] Fever  [] Chills Cardiac: [] Chest pain   [] Chest pressure   [] Palpitations   [] Shortness of breath when laying flat   [] Shortness of breath at rest   [] Shortness of breath with exertion. Vascular:  [] Pain in legs with walking   [] Pain in legs at rest   [] Pain in legs when laying flat   [] Claudication   [] Pain in feet when walking  [] Pain in feet at rest  [] Pain in feet when laying flat   [] History of DVT   [] Phlebitis   [] Swelling in legs   [] Varicose  veins   [] Non-healing ulcers Pulmonary:   [] Uses home oxygen   [] Productive cough   [] Hemoptysis   [] Wheeze  [] COPD   [] Asthma Neurologic:  [] Dizziness  [] Blackouts   [] Seizures   [] History of stroke   [] History of TIA  [] Aphasia   [] Temporary blindness   []   Dysphagia   [] Weakness or numbness in arms   [] Weakness or numbness in legs Musculoskeletal:  [x] Arthritis   [] Joint swelling   [] Joint pain   [x] Low back pain Hematologic:  [] Easy bruising  [] Easy bleeding   [] Hypercoagulable state   [] Anemic   Gastrointestinal:  [] Blood in stool   [] Vomiting blood  [] Gastroesophageal reflux/heartburn   [] Abdominal pain Genitourinary:  [] Chronic kidney disease   [] Difficult urination  [] Frequent urination  [] Burning with urination   [] Hematuria Skin:  [] Rashes   [] Ulcers   [] Wounds Psychological:  [x] History of anxiety   []  History of major depression.  Physical Examination  BP (!) 187/95 (BP Location: Left Arm)   Pulse 76   Resp 16   Wt 137 lb 7.2 oz (62.3 kg)   BMI 24.35 kg/m  Gen:  WD/WN, NAD. Appears younger than stated age. Head: /AT, No temporalis wasting. Ear/Nose/Throat: Hearing grossly intact, nares w/o erythema or drainage Eyes: Conjunctiva clear. Sclera non-icteric Neck: Supple.  Trachea midline Pulmonary:  Good air movement, no use of accessory muscles.  Cardiac: RRR, no JVD Vascular:  Vessel Right Left  Radial Palpable Palpable                                   Gastrointestinal: soft, non-tender/non-distended. No guarding/reflex.  Musculoskeletal: M/S 5/5 throughout.  No deformity or atrophy. no edema. Neurologic: Sensation grossly intact in extremities.  Symmetrical.  Speech is fluent.  Psychiatric: Judgment intact, Mood & affect appropriate for pt's clinical situation. Dermatologic: No rashes or ulcers noted.  No cellulitis or open wounds.      Labs No results found for this or any previous visit (from the past 2160 hour(s)).  Radiology VAS US RENAL ARTERY  DUPLEX  Result Date: 02/12/2023 ABDOMINAL VISCERAL Patient Name:  CHAUNTE ORRICK O'REILLY  Date of Exam:   02/11/2023 Medical Rec #: [id number]             Accession #:    [id number] Date of Birth: 07-01-1941             Patient Gender: F Patient Age:   26 years Exam Location:  [facility] Procedure:      VAS US RENAL ARTERY DUPLEX Referring Phys: [id number] Marlow Baars Emmary Culbreath -------------------------------------------------------------------------------- Indications: Follow up PTA for FMD High Risk Factors: Hypertension, hyperlipidemia, no history of smoking. Vascular Interventions: 10/25/21 Right renal PTA for fibromuscular dysplasia. Limitations: Air/bowel gas. Performing Technologist: Hardie Lora RVT  Examination Guidelines: A complete evaluation includes B-mode imaging, spectral Doppler, color Doppler, and power Doppler as needed of all accessible portions of each vessel. Bilateral testing is considered an integral part of a complete examination. Limited examinations for reoccurring indications may be performed as noted.  Duplex Findings: +------------+--------+--------+------+--------+ Mesenteric  PSV cm/sEDV cm/sPlaqueComments +------------+--------+--------+------+--------+ Aorta Prox     75                          +------------+--------+--------+------+--------+ Aorta Mid      63                          +------------+--------+--------+------+--------+ Aorta Distal   80                          +------------+--------+--------+------+--------+    +------------------+--------+--------+-------+ Right Renal ArteryPSV cm/sEDV cm/sComment +------------------+--------+--------+-------+ Origin  88      19           +------------------+--------+--------+-------+ Proximal            129      32           +------------------+--------+--------+-------+ Mid                 135      29           +------------------+--------+--------+-------+ Distal                37      10           +------------------+--------+--------+-------+ +-----------------+--------+--------+-------+ Left Renal ArteryPSV cm/sEDV cm/sComment +-----------------+--------+--------+-------+ Proximal           113      28           +-----------------+--------+--------+-------+ Mid                103      23           +-----------------+--------+--------+-------+ Distal              53      14           +-----------------+--------+--------+-------+ +------------+--------+--------+----+-----------+--------+--------+----+ Right KidneyPSV cm/sEDV cm/sRI  Left KidneyPSV cm/sEDV cm/sRI   +------------+--------+--------+----+-----------+--------+--------+----+ Upper Pole                      Upper Pole                      +------------+--------+--------+----+-----------+--------+--------+----+ Mid         71      17      0.        49      12      0.76 +------------+--------+--------+----+-----------+--------+--------+----+ Lower Pole                      Lower Pole                      +------------+--------+--------+----+-----------+--------+--------+----+ Hilar                           Hilar                           +------------+--------+--------+----+-----------+--------+--------+----+ +------------------+-------+------------------+-------+ Right Kidney             Left Kidney               +------------------+-------+------------------+-------+ RAR                      RAR                       +------------------+-------+------------------+-------+ RAR (manual)      1.69   RAR (manual)      1.29    +------------------+-------+------------------+-------+ Cortex                   Cortex                    +------------------+-------+------------------+-------+ Cortex thickness  0.99 mmCorex thickness   1.14 mm +------------------+-------+------------------+-------+ Kidney length (cm)10.00  Kidney length  (cm)9.60    +------------------+-------+------------------+-------+  Summary: Renal:  Right: Normal size right kidney. No evidence of right  renal artery        stenosis. RRV flow present. Normal right Resisitive Index. Left:  Normal size of left kidney. No evidence of left renal artery        stenosis. LRV flow present. Normal left Resistive Index.  *See table(s) above for measurements and observations.  Diagnosing physician: Festus Barren MD  Electronically signed by Festus Barren MD on 02/12/2023 at 8:46:45 AM.    Final     Assessment/Plan  Fibromuscular dysplasia of renal artery (HCC) Her renal artery duplex performed last week showed normal velocities in both renal arteries status post angioplasty of the right renal artery in the past.  Did not appear to be any hemodynamically significant stenosis at current. Doing well about a year and a half status post right renal artery angioplasty for fibromuscular dysplasia.  Improved clinical scenario with better blood pressure control.  Plan another 51-month follow-up and then likely go to 1 year follow-ups after that.  Continue current medical regimen.  Renovascular hypertension Blood pressure control is significantly improved after renal artery intervention on the right.  I will defer to her primary care physician whether or not she can reduce the medications    Hyperlipidemia lipid control important in reducing the progression of atherosclerotic disease. Continue statin therapy  Festus Barren, MD  02/18/2023 11:00 AM    This note was created with Dragon medical transcription system.  Any errors from dictation are purely unintentional

## 2023-02-18 NOTE — Assessment & Plan Note (Signed)
Her renal artery duplex performed last week showed normal velocities in both renal arteries status post angioplasty of the right renal artery in the past.  Did not appear to be any hemodynamically significant stenosis at current. Doing well about a year and a half status post right renal artery angioplasty for fibromuscular dysplasia.  Improved clinical scenario with better blood pressure control.  Plan another 34-month follow-up and then likely go to 1 year follow-ups after that.  Continue current medical regimen.

## 2023-03-25 ENCOUNTER — Telehealth: Payer: Self-pay | Admitting: *Deleted

## 2023-03-25 ENCOUNTER — Ambulatory Visit: Payer: Medicare HMO | Admitting: *Deleted

## 2023-03-25 VITALS — Ht 63.0 in | Wt 132.0 lb

## 2023-03-25 DIAGNOSIS — E034 Atrophy of thyroid (acquired): Secondary | ICD-10-CM

## 2023-03-25 DIAGNOSIS — E7800 Pure hypercholesterolemia, unspecified: Secondary | ICD-10-CM

## 2023-03-25 DIAGNOSIS — Z0000 Encounter for general adult medical examination without abnormal findings: Secondary | ICD-10-CM | POA: Diagnosis not present

## 2023-03-25 DIAGNOSIS — I150 Renovascular hypertension: Secondary | ICD-10-CM

## 2023-03-25 NOTE — Patient Instructions (Signed)
Per patient no change in vitals since last visit, unable to obtain new vitals due to telehealth visit  [name] , Thank you for taking time to come for your Medicare Wellness Visit. I appreciate your ongoing commitment to your health goals. Please review the following plan we discussed and let me know if I can assist you in the future.   These are the goals we discussed:  Goals      Maintain Healthy Lifestyle     Stay active Healthy diet     Patient Stated     None        This is a list of the screening recommended for you and due dates:  Health Maintenance  Topic Date Due   DTaP/Tdap/Td vaccine (3 - Tdap) 02/26/2023   Flu Shot  04/10/2023   Mammogram  05/25/2023   DEXA scan (bone density measurement)  03/08/2024   Medicare Annual Wellness Visit  03/24/2024   Pneumonia Vaccine  Completed   HPV Vaccine  Aged Out   COVID-19 Vaccine  Discontinued   Hepatitis C Screening  Discontinued   Zoster (Shingles) Vaccine  Discontinued    Advanced directives: Will work on this  Conditions/risks identified: None  /Next appointment: Follow up in one year for your annual wellness visit 03/30/24 @ 1:30    Preventive Care 65 Years and Older, Female Preventive care refers to lifestyle choices and visits with your health care provider that can promote health and wellness. What does preventive care include? A yearly physical exam. This is also called an annual well check. Dental exams once or twice a year. Routine eye exams. Ask your health care provider how often you should have your eyes checked. Personal lifestyle choices, including: Daily care of your teeth and gums. Regular physical activity. Eating a healthy diet. Avoiding tobacco and drug use. Limiting alcohol use. Practicing safe sex. Taking low-dose aspirin every day. Taking vitamin and mineral supplements as recommended by your health care provider. What happens during an annual well check? The services and screenings  done by your health care provider during your annual well check will depend on your age, overall health, lifestyle risk factors, and family history of disease. Counseling  Your health care provider may ask you questions about your: Alcohol use. Tobacco use. Drug use. Emotional well-being. Home and relationship well-being. Sexual activity. Eating habits. History of falls. Memory and ability to understand (cognition). Work and work Astronomer. Reproductive health. Screening  You may have the following tests or measurements: Height, weight, and BMI. Blood pressure. Lipid and cholesterol levels. These may be checked every 5 years, or more frequently if you are over 50 years old. Skin check. Lung cancer screening. You may have this screening every year starting at age 16 if you have a 30-pack-year history of smoking and currently smoke or have quit within the past 15 years. Fecal occult blood test (FOBT) of the stool. You may have this test every year starting at age 54. Flexible sigmoidoscopy or colonoscopy. You may have a sigmoidoscopy every 5 years or a colonoscopy every 10 years starting at age 64. Hepatitis C blood test. Hepatitis B blood test. Sexually transmitted disease (STD) testing. Diabetes screening. This is done by checking your blood sugar (glucose) after you have not eaten for a while (fasting). You may have this done every 1-3 years. Bone density scan. This is done to screen for osteoporosis. You may have this done starting at age 43. Mammogram. This may be done every 1-2  years. Talk to your health care provider about how often you should have regular mammograms. Talk with your health care provider about your test results, treatment options, and if necessary, the need for more tests. Vaccines  Your health care provider may recommend certain vaccines, such as: Influenza vaccine. This is recommended every year. Tetanus, diphtheria, and acellular pertussis (Tdap, Td)  vaccine. You may need a Td booster every 10 years. Zoster vaccine. You may need this after age 83. Pneumococcal 13-valent conjugate (PCV13) vaccine. One dose is recommended after age 59. Pneumococcal polysaccharide (PPSV23) vaccine. One dose is recommended after age 68. Talk to your health care provider about which screenings and vaccines you need and how often you need them. This information is not intended to replace advice given to you by your health care provider. Make sure you discuss any questions you have with your health care provider. Document Released: 09/22/2015 Document Revised: 05/15/2016 Document Reviewed: 06/27/2015 Elsevier Interactive Patient Education  2017 [employer].  Fall Prevention in the Home Falls can cause injuries. They can happen to people of all ages. There are many things you can do to make your home safe and to help prevent falls. What can I do on the outside of my home? Regularly fix the edges of walkways and driveways and fix any cracks. Remove anything that might make you trip as you walk through a door, such as a raised step or threshold. Trim any bushes or trees on the path to your home. Use bright outdoor lighting. Clear any walking paths of anything that might make someone trip, such as rocks or tools. Regularly check to see if handrails are loose or broken. Make sure that both sides of any steps have handrails. Any raised decks and porches should have guardrails on the edges. Have any leaves, snow, or ice cleared regularly. Use sand or salt on walking paths during winter. Clean up any spills in your garage right away. This includes oil or grease spills. What can I do in the bathroom? Use night lights. Install grab bars by the toilet and in the tub and shower. Do not use towel bars as grab bars. Use non-skid mats or decals in the tub or shower. If you need to sit down in the shower, use a plastic, non-slip stool. Keep the floor dry. Clean up any  water that spills on the floor as soon as it happens. Remove soap buildup in the tub or shower regularly. Attach bath mats securely with double-sided non-slip rug tape. Do not have throw rugs and other things on the floor that can make you trip. What can I do in the bedroom? Use night lights. Make sure that you have a light by your bed that is easy to reach. Do not use any sheets or blankets that are too big for your bed. They should not hang down onto the floor. Have a firm chair that has side arms. You can use this for support while you get dressed. Do not have throw rugs and other things on the floor that can make you trip. What can I do in the kitchen? Clean up any spills right away. Avoid walking on wet floors. Keep items that you use a lot in easy-to-reach places. If you need to reach something above you, use a strong step stool that has a grab bar. Keep electrical cords out of the way. Do not use floor polish or wax that makes floors slippery. If you must use wax, use non-skid floor  wax. Do not have throw rugs and other things on the floor that can make you trip. What can I do with my stairs? Do not leave any items on the stairs. Make sure that there are handrails on both sides of the stairs and use them. Fix handrails that are broken or loose. Make sure that handrails are as long as the stairways. Check any carpeting to make sure that it is firmly attached to the stairs. Fix any carpet that is loose or worn. Avoid having throw rugs at the top or bottom of the stairs. If you do have throw rugs, attach them to the floor with carpet tape. Make sure that you have a light switch at the top of the stairs and the bottom of the stairs. If you do not have them, ask someone to add them for you. What else can I do to help prevent falls? Wear shoes that: Do not have high heels. Have rubber bottoms. Are comfortable and fit you well. Are closed at the toe. Do not wear sandals. If you use a  stepladder: Make sure that it is fully opened. Do not climb a closed stepladder. Make sure that both sides of the stepladder are locked into place. Ask someone to hold it for you, if possible. Clearly mark and make sure that you can see: Any grab bars or handrails. First and last steps. Where the edge of each step is. Use tools that help you move around (mobility aids) if they are needed. These include: Canes. Walkers. Scooters. Crutches. Turn on the lights when you go into a dark area. Replace any light bulbs as soon as they burn out. Set up your furniture so you have a clear path. Avoid moving your furniture around. If any of your floors are uneven, fix them. If there are any pets around you, be aware of where they are. Review your medicines with your doctor. Some medicines can make you feel dizzy. This can increase your chance of falling. Ask your doctor what other things that you can do to help prevent falls. This information is not intended to replace advice given to you by your health care provider. Make sure you discuss any questions you have with your health care provider. Document Released: 06/22/2009 Document Revised: 02/01/2016 Document Reviewed: 09/30/2014 Elsevier Interactive Patient Education  2017 [employer].

## 2023-03-25 NOTE — Progress Notes (Addendum)
Subjective:   Wendy Love is a 82 y.o. female who presents for Medicare Annual (Subsequent) preventive examination.  Visit Complete: Virtual  I connected with  Wendy Love on 03/25/23 by a audio enabled telemedicine application and verified that I am speaking with the correct person using two identifiers.  Patient Location: Home  Provider Location: Office/Clinic  I discussed the limitations of evaluation and management by telemedicine. The patient expressed understanding and agreed to proceed.  Review of Systems     Cardiac Risk Factors include: advanced age (>62men, >63 women);dyslipidemia;hypertension     Objective:    Today's Vitals   03/25/23 1508  Weight: 132 lb (59.9 kg)  Height: 5\' 3"  (1.6 m)   Body mass index is 23.38 kg/m.     03/25/2023    3:28 PM 02/22/2022    8:30 AM 02/20/2021    3:39 PM 10/18/2020    8:40 AM 07/26/2020   11:44 AM 02/18/2020    1:24 PM 02/17/2019   11:46 AM  Advanced Directives  Does Patient Have a Medical Advance Directive? Yes Yes Yes Yes Yes Yes Yes  Type of Estate agent of [place];Living will Healthcare Power of [place];Living will Healthcare Power of [place];Living will Healthcare Power of [place];Living will Living will;Healthcare Power of [employer] Power of [place];Living will Healthcare Power of [place];Living will  Does patient want to make changes to medical advance directive?  No - Patient declined No - Patient declined No - Patient declined  No - Patient declined No - Patient declined  Copy of Healthcare Power of Attorney in Chart? No - copy requested No - copy requested No - copy requested No - copy requested No - copy requested No - copy requested No - copy requested    Current Medications (verified) Outpatient Encounter Medications as of 03/25/2023  Medication Sig   amLODipine (NORVASC) 5 MG tablet Take 1 tablet (5 mg total) by mouth daily.   ASPIRIN LOW DOSE 81 MG tablet  TAKE 1 TABLET BY MOUTH EVERY DAY   b complex vitamins tablet Take 1 tablet by mouth daily.   CALCIUM-MAGNESIUM PO Take by mouth.   carvedilol (COREG) 3.125 MG tablet TAKE 1 TABLET BY MOUTH TWICE A DAY WITH A MEAL   Cholecalciferol (VITAMIN D3) 2000 UNITS TABS Take 5,000 Units by mouth daily.   Coenzyme Q10 (CO Q-10) 100 MG CAPS Take by mouth daily.   levothyroxine (SYNTHROID) 75 MCG tablet TAKE 1 TABLET BY MOUTH EVERY DAY   losartan (COZAAR) 50 MG tablet Take 2 tablets (100 mg total) by mouth daily.   Magnesium 300 MG CAPS Take by mouth daily.   melatonin 1 MG TABS tablet Take 2 mg by mouth at bedtime.   Multiple Vitamin (MULTIVITAMIN) tablet Take 1 tablet by mouth daily.   Omega-3 Fatty Acids (FISH OIL PO) Take by mouth daily.   PROBIOTIC CAPS Take by mouth daily.   QUERCETIN PO Take 560 mg by mouth daily.   temazepam (RESTORIL) 15 MG capsule TAKE 1 CAPSULE AT BEDTIME   tretinoin (RETIN-A) 0.025 % cream APPLY TO WHOLE FACE EACH NIGHT.   vitamin C (ASCORBIC ACID) 500 MG tablet Take 1,000 mg by mouth daily.    Zinc 50 MG TABS Take 50 mg by mouth daily.   ALPRAZolam (XANAX) 0.25 MG tablet Take 0.25 mg by mouth at bedtime as needed for anxiety. (Patient not taking: Reported on 11/01/2022)   No facility-administered encounter medications on file as of 03/25/2023.  Allergies (verified) Atorvastatin, Brompheniramine-pseudoeph, Codeine, Diphenhydramine hcl, Hctz [hydrochlorothiazide], Loratadine, Other, Tape, Triamterene, and Tramadol hcl   History: Past Medical History:  Diagnosis Date   Anxiety    Bronchitis    Cancer (HCC)    squamous cell   Chest pain    a. 09/2010 Cor CTA: Nl cors. EF 56%.   Claudication [facility])    a. 11/2021 ABIs: R 1.09, L 1.01.   COVID-19 09/01/21   home test    Degenerative disc disease, lumbar    Depression    Endometriosis 1974   s/p abdominal surgery, 2nd surgery   Fibromuscular dysplasia (HCC)    High cholesterol    Hypertension    Hypothyroidism     Rhinosinusitis    Right renal artery stenosis (HCC)    a. 10/2021 s/p PTA/DCBA R renal artery; b. 11/2021 Renal duplex: 1-59% bilat RA stenoses.   Sciatica    Past Surgical History:  Procedure Laterality Date   ABDOMINAL HYSTERECTOMY  09/09/1998   APPENDECTOMY     CARPAL TUNNEL RELEASE     bilateral   CATARACT EXTRACTION W/PHACO Left 07/26/2020   Procedure: CATARACT EXTRACTION PHACO AND INTRAOCULAR LENS PLACEMENT (IOC) LEFT 6.30 01:10.2 9.0%;  Surgeon: Lockie Mola, MD;  Location: [facility];  Service: Ophthalmology;  Laterality: Left;   CATARACT EXTRACTION W/PHACO Right 10/18/2020   Procedure: CATARACT EXTRACTION PHACO AND INTRAOCULAR LENS PLACEMENT (IOC) RIGHT 9.18 01:14.6 12.3%;  Surgeon: Lockie Mola, MD;  Location: [facility];  Service: Ophthalmology;  Laterality: Right;   dental implant  2023   Endoscopy/colonoscopy  12/09/2010   Schatski's Ring , small hiatal hernia   hemilaminectomy  12/08/2008   L3-L4hemi, L4-L5 microdiskectomy   KNEE ARTHROSCOPY WITH LATERAL MENISECTOMY Right 04/08/2018   Procedure: KNEE ARTHROSCOPY WITH PARTIAL LATERAL MENISECTOMY and debridement.;  Surgeon: Christena Flake, MD;  Location: [facility];  Service: Orthopedics;  Laterality: Right;   REFRACTIVE SURGERY Right 06/2022   RENAL ANGIOGRAPHY N/A 10/25/2021   Procedure: RENAL ANGIOGRAPHY;  Surgeon: Annice Needy, MD;  Location: [facility] INVASIVE CV LAB;  Service: Cardiovascular;  Laterality: N/A;   Family History  Problem Relation Age of Onset   Ovarian cancer Mother    Cancer Mother 48       ovarian cancer, treated by [facility]   Heart disease Father    Breast cancer Neg Hx    Social History   Socioeconomic History   Marital status: Widowed    Spouse name: Not on file   Number of children: Not on file   Years of education: Not on file   Highest education level: Not on file  Occupational History   Not on file  Tobacco Use   Smoking status: Never   Smokeless  tobacco: Never   Tobacco comments:    smoked socially in college  Vaping Use   Vaping status: Never Used  Substance and Sexual Activity   Alcohol use: No   Drug use: No   Sexual activity: Not Currently  Other Topics Concern   Not on file  Social History Narrative   widowed   Social Determinants of Health   Financial Resource Strain: Low Risk  (03/25/2023)   Overall Financial Resource Strain (CARDIA)    Difficulty of Paying Living Expenses: Not hard at all  Food Insecurity: No Food Insecurity (03/25/2023)   Hunger Vital Sign    Worried About Running Out of Food in the Last Year: Never true    Ran Out of Food in  the Last Year: Never true  Transportation Needs: No Transportation Needs (03/25/2023)   PRAPARE - Administrator, Civil Service (Medical): No    Lack of Transportation (Non-Medical): No  Physical Activity: Insufficiently Active (03/25/2023)   Exercise Vital Sign    Days of Exercise per Week: 5 days    Minutes of Exercise per Session: 20 min  Stress: No Stress Concern Present (03/25/2023)   [employer] of Occupational Health - Occupational Stress Questionnaire    Feeling of Stress : Not at all  Social Connections: Moderately Isolated (03/25/2023)   Social Connection and Isolation Panel [NHANES]    Frequency of Communication with Friends and Family: More than three times a week    Frequency of Social Gatherings with Friends and Family: More than three times a week    Attends Religious Services: Never    Database administrator or Organizations: Yes    Attends Engineer, structural: More than 4 times per year    Marital Status: Widowed    Tobacco Counseling Counseling given: Not Answered Tobacco comments: smoked socially in college   Clinical Intake:  Pre-visit preparation completed: Yes  Pain : No/denies pain     BMI - recorded: 23.38 Nutritional Status: BMI of 19-24  Normal Nutritional Risks: None Diabetes: No  How often do you need  to have someone help you when you read instructions, pamphlets, or other written materials from your doctor or pharmacy?: 1 - Never  Interpreter Needed?: No  Information entered by :: R. Isaih Bulger LPN   Activities of Daily Living    03/25/2023    3:10 PM  In your present state of health, do you have any difficulty performing the following activities:  Hearing? 0  Vision? 0  Comment glasses  Difficulty concentrating or making decisions? 0  Walking or climbing stairs? 0  Dressing or bathing? 0  Doing errands, shopping? 0  Preparing Food and eating ? N  Using the Toilet? N  In the past six months, have you accidently leaked urine? N  Do you have problems with loss of bowel control? N  Managing your Medications? N  Managing your Finances? N  Housekeeping or managing your Housekeeping? N    Patient Care Team: Sherlene Shams, MD as PCP - General (Internal Medicine) Antonieta Iba, MD as PCP - Cardiology (Cardiology)  Indicate any recent Medical Services you may have received from other than [facility] providers in the past year (date may be approximate).     Assessment:   This is a routine wellness examination for Swathi.  Hearing/Vision screen Hearing Screening - Comments:: No issues Vision Screening - Comments:: glasses  Dietary issues and exercise activities discussed:     Goals Addressed             This Visit's Progress    Patient Stated       None       Depression Screen    03/25/2023    3:16 PM 12/17/2022    1:09 PM 11/01/2022   11:02 AM 05/01/2022   10:35 AM 02/22/2022    8:30 AM 12/17/2021   11:38 AM 10/16/2021    2:08 PM  PHQ 2/9 Scores  PHQ - 2 Score 0 0 0 0 0 0 0  PHQ- 9 Score 0          Fall Risk    03/25/2023    3:21 PM 12/17/2022    1:09 PM 11/01/2022   11:02 AM  05/01/2022   10:35 AM 02/22/2022    8:29 AM  Fall Risk   Falls in the past year? 0 0 0 0 0  Number falls in past yr: 0 0 0 0 0  Injury with Fall? 0 0 0 0 0  Risk for fall due to : No  Fall Risks No Fall Risks No Fall Risks No Fall Risks   Follow up Falls prevention discussed;Falls evaluation completed Falls evaluation completed Falls evaluation completed Falls evaluation completed Falls evaluation completed    MEDICARE RISK AT HOME:  Medicare Risk at Home - 03/25/23 1522     Any stairs in or around the home? No    If so, are there any without handrails? No    Home free of loose throw rugs in walkways, pet beds, electrical cords, etc? Yes    Adequate lighting in your home to reduce risk of falls? Yes    Life alert? No    Use of a cane, walker or w/c? No    Grab bars in the bathroom? No    Shower chair or bench in shower? No    Elevated toilet seat or a handicapped toilet? Yes              Cognitive Function:        03/25/2023    3:29 PM 02/17/2019   11:49 AM  6CIT Screen  What Year? 0 points 0 points  What month? 0 points 0 points  What time? 0 points 0 points  Count back from 20 0 points 0 points  Months in reverse 0 points 0 points  Repeat phrase 0 points 0 points  Total Score 0 points 0 points    Immunizations Immunization History  Administered Date(s) Administered   Influenza Split 06/10/2012, 06/09/2013   Influenza Whole 06/06/2009   Pneumococcal Conjugate-13 07/04/2015   Pneumococcal Polysaccharide-23 04/24/2009   Td 06/06/2009, 02/25/2013   Zoster, Live 05/28/2010    TDAP status: Due, Education has been provided regarding the importance of this vaccine. Advised may receive this vaccine at local pharmacy or Health Dept. Aware to provide a copy of the vaccination record if obtained from local pharmacy or Health Dept. Verbalized acceptance and understanding.  Flu Vaccine status: Declined, Education has been provided regarding the importance of this vaccine but patient still declined. Advised may receive this vaccine at local pharmacy or Health Dept. Aware to provide a copy of the vaccination record if obtained from local pharmacy or Health  Dept. Verbalized acceptance and understanding.  Pneumococcal vaccine status: Up to date  Covid-19 vaccine status: Declined, Education has been provided regarding the importance of this vaccine but patient still declined. Advised may receive this vaccine at local pharmacy or Health Dept.or vaccine clinic. Aware to provide a copy of the vaccination record if obtained from local pharmacy or Health Dept. Verbalized acceptance and understanding.  Qualifies for Shingles Vaccine? Yes   Zostavax completed Yes   Shingrix Completed?: No.    Education has been provided regarding the importance of this vaccine. Patient has been advised to call insurance company to determine out of pocket expense if they have not yet received this vaccine. Advised may also receive vaccine at local pharmacy or Health Dept. Verbalized acceptance and understanding.  Screening Tests Health Maintenance  Topic Date Due   Medicare Annual Wellness (AWV)  02/23/2023   DTaP/Tdap/Td (3 - Tdap) 02/26/2023   INFLUENZA VACCINE  04/10/2023   MAMMOGRAM  05/25/2023   DEXA SCAN  03/08/2024  Pneumonia Vaccine 68+ Years old  Completed   HPV VACCINES  Aged Out   COVID-19 Vaccine  Discontinued   Hepatitis C Screening  Discontinued   Zoster Vaccines- Shingrix  Discontinued    Health Maintenance  Health Maintenance Due  Topic Date Due   Medicare Annual Wellness (AWV)  02/23/2023   DTaP/Tdap/Td (3 - Tdap) 02/26/2023    Colorectal cancer screening: No longer required.   Mammogram status: No longer required due to age Patient may continue because of family history.  Bone Density status: Completed /. Results reflect: Bone density results: OSTEOPENIA. Repeat every 2 years.   Lung Cancer Screening: (Low Dose CT Chest recommended if Age 65-80 years, 20 pack-year currently smoking OR have quit w/in 15years.) does not qualify.     Additional Screening:  Hepatitis C Screening: does not qualify; Completed NA age  Vision Screening:  Recommended annual ophthalmology exams for early detection of glaucoma and other disorders of the eye. Is the patient up to date with their annual eye exam?  Yes  Who is the provider or what is the name of the office in which the patient attends annual eye exams? [facility] If pt is not established with a provider, would they like to be referred to a provider to establish care? No .   Dental Screening: Recommended annual dental exams for proper oral hygiene    Community Resource Referral / Chronic Care Management: CRR required this visit?  No   CCM required this visit?  No     Plan:     I have personally reviewed and noted the following in the patient's chart:   Medical and social history Use of alcohol, tobacco or illicit drugs  Current medications and supplements including opioid prescriptions. Patient is not currently taking opioid prescriptions. Functional ability and status Nutritional status Physical activity Advanced directives List of other physicians Hospitalizations, surgeries, and ER visits in previous 12 months Vitals Screenings to include cognitive, depression, and falls Referrals and appointments  In addition, I have reviewed and discussed with patient certain preventive protocols, quality metrics, and best practice recommendations. A written personalized care plan for preventive services as well as general preventive health recommendations were provided to patient.     Sydell Axon, LPN   1/61/0960   After Visit Summary: (MyChart) Due to this being a telephonic visit, the after visit summary with patients personalized plan was offered to patient via MyChart   Nurse Notes: None  I have reviewed the above information and agree with above.   Duncan Dull, MD

## 2023-03-25 NOTE — Telephone Encounter (Signed)
SHE WAS ADVISED TO DONATE BLOOD PERIODICALLY IN February.  If she has not,  she can before she comes to see me. . Labs have been ordered

## 2023-03-25 NOTE — Telephone Encounter (Addendum)
Patient had a Medicare Wellness telephone visit today. Patient has an upcoming appointment scheduled with you in August. Patient wants to know if she needs to have blood work prior to the visit. Patient wants to know if she needs to give blood before her next visit with you. Patient stated the last time that she gave blood was 01/17/23. Patient requested a call back regarding this.

## 2023-03-26 NOTE — Telephone Encounter (Signed)
Pt stated that she has donated blood in February and May. She knows that she needs to donate again in August but wanted to see if she needed to have her lab work done before she donates or after.

## 2023-03-27 NOTE — Telephone Encounter (Signed)
LMTCB

## 2023-03-27 NOTE — Telephone Encounter (Signed)
Patient returned call from Sandy Salaam, CMA.  I read Dr. Rosey Bath Tullo's message to patient.  Patient states she will be giving blood the first part of August.  I scheduled an appointment for patient to have her fasting labs on 04/09/2023.

## 2023-03-28 NOTE — Telephone Encounter (Signed)
noted 

## 2023-04-09 ENCOUNTER — Encounter (INDEPENDENT_AMBULATORY_CARE_PROVIDER_SITE_OTHER): Payer: Self-pay

## 2023-04-09 ENCOUNTER — Other Ambulatory Visit: Payer: Medicare HMO

## 2023-04-13 ENCOUNTER — Other Ambulatory Visit: Payer: Self-pay | Admitting: Internal Medicine

## 2023-04-13 DIAGNOSIS — I10 Essential (primary) hypertension: Secondary | ICD-10-CM

## 2023-04-23 ENCOUNTER — Other Ambulatory Visit (INDEPENDENT_AMBULATORY_CARE_PROVIDER_SITE_OTHER): Payer: Medicare HMO

## 2023-04-23 DIAGNOSIS — I150 Renovascular hypertension: Secondary | ICD-10-CM

## 2023-04-23 DIAGNOSIS — E034 Atrophy of thyroid (acquired): Secondary | ICD-10-CM | POA: Diagnosis not present

## 2023-04-23 DIAGNOSIS — E7800 Pure hypercholesterolemia, unspecified: Secondary | ICD-10-CM

## 2023-04-23 LAB — TSH: TSH: 1.31 u[IU]/mL (ref 0.35–5.50)

## 2023-04-23 LAB — COMPREHENSIVE METABOLIC PANEL
ALT: 17 U/L (ref 0–35)
AST: 24 U/L (ref 0–37)
Albumin: 4.1 g/dL (ref 3.5–5.2)
Alkaline Phosphatase: 56 U/L (ref 39–117)
BUN: 11 mg/dL (ref 6–23)
CO2: 31 mEq/L (ref 19–32)
Calcium: 9.3 mg/dL (ref 8.4–10.5)
Chloride: 104 mEq/L (ref 96–112)
Creatinine, Ser: 0.63 mg/dL (ref 0.40–1.20)
GFR: 82.33 mL/min (ref >60.00)
Glucose, Bld: 97 mg/dL (ref 70–99)
Potassium: 4.1 mEq/L (ref 3.5–5.1)
Sodium: 141 mEq/L (ref 135–145)
Total Bilirubin: 0.5 mg/dL (ref 0.2–1.2)
Total Protein: 6.7 g/dL (ref 6.0–8.3)

## 2023-04-23 LAB — CBC WITH DIFFERENTIAL/PLATELET
Basophils Absolute: 0 10*3/uL (ref 0.0–0.1)
Basophils Relative: 0.8 % (ref 0.0–3.0)
Eosinophils Absolute: 0.2 10*3/uL (ref 0.0–0.7)
Eosinophils Relative: 4.5 % (ref 0.0–5.0)
HCT: 38.8 % (ref 36.0–46.0)
Hemoglobin: 13.2 g/dL (ref 12.0–15.0)
Lymphocytes Relative: 32.2 % (ref 12.0–46.0)
Lymphs Abs: 1.7 10*3/uL (ref 0.7–4.0)
MCHC: 33.9 g/dL (ref 30.0–36.0)
MCV: 96.4 fl (ref 78.0–100.0)
Monocytes Absolute: 0.5 10*3/uL (ref 0.1–1.0)
Monocytes Relative: 10.2 % (ref 3.0–12.0)
Neutro Abs: 2.7 10*3/uL (ref 1.4–7.7)
Neutrophils Relative %: 52.3 % (ref 43.0–77.0)
Platelets: 287 10*3/uL (ref 150.0–400.0)
RBC: 4.02 Mil/uL (ref 3.87–5.11)
RDW: 12.5 % (ref 11.5–15.5)
WBC: 5.2 10*3/uL (ref 4.0–10.5)

## 2023-04-23 LAB — LIPID PANEL
Cholesterol: 245 mg/dL — ABNORMAL HIGH (ref 0–200)
HDL: 90.3 mg/dL (ref >39.00)
LDL Cholesterol: 144 mg/dL — ABNORMAL HIGH (ref 0–99)
NonHDL: 155.01
Total CHOL/HDL Ratio: 3
Triglycerides: 57 mg/dL (ref 0.0–149.0)
VLDL: 11.4 mg/dL (ref 0.0–40.0)

## 2023-04-24 LAB — IRON,TIBC AND FERRITIN PANEL
%SAT: 31 % (ref 16–45)
Ferritin: 15 ng/mL — ABNORMAL LOW (ref 16–288)
Iron: 94 ug/dL (ref 45–160)
TIBC: 304 ug/dL (ref 250–450)

## 2023-05-02 ENCOUNTER — Ambulatory Visit: Payer: Medicare HMO | Admitting: Internal Medicine

## 2023-05-02 ENCOUNTER — Encounter: Payer: Self-pay | Admitting: Internal Medicine

## 2023-05-02 ENCOUNTER — Other Ambulatory Visit: Payer: Self-pay

## 2023-05-02 VITALS — BP 126/74 | HR 75 | Temp 97.9°F | Ht 63.0 in | Wt 128.4 lb

## 2023-05-02 DIAGNOSIS — I739 Peripheral vascular disease, unspecified: Secondary | ICD-10-CM

## 2023-05-02 DIAGNOSIS — E034 Atrophy of thyroid (acquired): Secondary | ICD-10-CM | POA: Diagnosis not present

## 2023-05-02 DIAGNOSIS — I150 Renovascular hypertension: Secondary | ICD-10-CM

## 2023-05-02 DIAGNOSIS — Z1231 Encounter for screening mammogram for malignant neoplasm of breast: Secondary | ICD-10-CM

## 2023-05-02 MED ORDER — TEMAZEPAM 15 MG PO CAPS: 15.0000 mg | ORAL_CAPSULE | Freq: Every day | ORAL | 2 refills | Status: DC

## 2023-05-02 MED ORDER — TEMAZEPAM 15 MG PO CAPS: 15.0000 mg | ORAL_CAPSULE | Freq: Every day | ORAL | 5 refills | Status: DC

## 2023-05-02 MED ORDER — LOSARTAN POTASSIUM 50 MG PO TABS: 100.0000 mg | ORAL_TABLET | Freq: Every day | ORAL | 1 refills | Status: DC

## 2023-05-02 MED ORDER — LEVOTHYROXINE SODIUM 75 MCG PO TABS: 75.0000 ug | ORAL_TABLET | Freq: Every day | ORAL | 1 refills | Status: DC

## 2023-05-02 NOTE — Progress Notes (Unsigned)
Patient ID: Wendy Love, female    DOB: 12-29-1940  Age: 82 y.o. MRN: [id number]  The patient is here for follow up and and management of chronic and acute problems.   The risk factors are reflected in the social history.   The roster of all physicians providing medical care to patient - is listed in the Snapshot section of the chart.   Activities of daily living:  The patient is 100% independent in all ADLs: dressing, toileting, feeding as well as independent mobility   Home safety : The patient has smoke detectors in the home. They wear seatbelts.  There are no unsecured firearms at home. There is no violence in the home.    There is no risks for hepatitis, STDs or HIV. There is no   history of blood transfusion. They have no travel history to infectious disease endemic areas of the world.   The patient has seen their dentist in the last six month. They have seen their eye doctor in the last year. The patinet  denies slight hearing difficulty with regard to whispered voices and some television programs.  They have deferred audiologic testing in the last year.  They do not  have excessive sun exposure. Discussed the need for sun protection: hats, long sleeves and use of sunscreen if there is significant sun exposure.    Diet: the importance of a healthy diet is discussed. They do have a healthy diet.   The benefits of regular aerobic exercise were discussed. The patient  exercises  3 to 5 days per week  for  60 minutes.    Depression screen: there are no signs or vegative symptoms of depression- irritability, change in appetite, anhedonia, sadness/tearfullness.   The following portions of the patient's history were reviewed and updated as appropriate: allergies, current medications, past family history, past medical history,  past surgical history, past social history  and problem list.   Visual acuity was not assessed per patient preference since the patient has regular follow up  with an  ophthalmologist. Hearing and body mass index were assessed and reviewed.    During the course of the visit the patient was educated and counseled about appropriate screening and preventive services including : fall prevention , diabetes screening, nutrition counseling, colorectal cancer screening, and recommended immunizations.    Chief Complaint:   1) Chronic insomnia: managed wth temazepam   2) intentional weight los of 6 bs using the "9 day wonder diet"  3) HTN:   patient checks blood pressure twice weekly at home.  Readings have been for the most part <130/80 at rest . Patient is following a reduced salt diet most days and is taking medications as prescribed   Review of Symptoms  Patient denies headache, fevers, malaise, unintentional weight loss, skin rash, eye pain, sinus congestion and sinus pain, sore throat, dysphagia,  hemoptysis , cough, dyspnea, wheezing, chest pain, palpitations, orthopnea, edema, abdominal pain, nausea, melena, diarrhea, constipation, flank pain, dysuria, hematuria, urinary  Frequency, nocturia, numbness, tingling, seizures,  Focal weakness, Loss of consciousness,  Tremor, insomnia, depression, anxiety, and suicidal ideation.    Physical Exam:  BP 126/74   Pulse 75   Temp 97.9 F (36.6 C) (Oral)   Ht 5\' 3"  (1.6 m)   Wt 128 lb 6.4 oz (58.2 kg)   SpO2 97%   BMI 22.75 kg/m    Physical Exam Vitals reviewed.  Constitutional:      General: She is not in acute distress.  Appearance: Normal appearance. She is well-developed and normal weight. She is not ill-appearing, toxic-appearing or diaphoretic.  HENT:     Head: Normocephalic.     Right Ear: Tympanic membrane, ear canal and external ear normal. There is no impacted cerumen.     Left Ear: Tympanic membrane, ear canal and external ear normal. There is no impacted cerumen.     Nose: Nose normal.     Mouth/Throat:     Mouth: Mucous membranes are moist.     Pharynx: Oropharynx is clear.  Eyes:      General: No scleral icterus.       Right eye: No discharge.        Left eye: No discharge.     Conjunctiva/sclera: Conjunctivae normal.     Pupils: Pupils are equal, round, and reactive to light.  Neck:     Thyroid: No thyromegaly.     Vascular: No carotid bruit or JVD.  Cardiovascular:     Rate and Rhythm: Normal rate and regular rhythm.     Heart sounds: Normal heart sounds.  Pulmonary:     Effort: Pulmonary effort is normal. No respiratory distress.     Breath sounds: Normal breath sounds.  Chest:  Breasts:    Breasts are symmetrical.     Right: Normal. No swelling, inverted nipple, mass, nipple discharge, skin change or tenderness.     Left: Normal. No swelling, inverted nipple, mass, nipple discharge, skin change or tenderness.  Abdominal:     General: Bowel sounds are normal.     Palpations: Abdomen is soft. There is no mass.     Tenderness: There is no abdominal tenderness. There is no guarding or rebound.  Musculoskeletal:        General: Normal range of motion.     Cervical back: Normal range of motion and neck supple.  Lymphadenopathy:     Cervical: No cervical adenopathy.     Upper Body:     Right upper body: No supraclavicular, axillary or pectoral adenopathy.     Left upper body: No supraclavicular, axillary or pectoral adenopathy.  Skin:    General: Skin is warm and dry.  Neurological:     General: No focal deficit present.     Mental Status: She is alert and oriented to person, place, and time. Mental status is at baseline.  Psychiatric:        Mood and Affect: Mood normal.        Behavior: Behavior normal.        Thought Content: Thought content normal.        Judgment: Judgment normal.    Assessment and Plan: Encounter for screening mammogram for malignant neoplasm of breast -     3D Screening Mammogram, Left and Right; Future  Renovascular hypertension Assessment & Plan: Her renal artery duplex performed in June 2024 showed normal velocities in  both renal arteries status post angioplasty of the right renal artery in the past. Did not appear to be any hemodynamically significant stenosis at current.    Peripheral artery disease [facility]) Assessment & Plan: With bilateral ABIs of 0.7 by [facility] screening.  She has been referred to Dr Wyn Quaker for formal evaluation and ABI's were normal jan 2023    Hypothyroidism due to acquired atrophy of thyroid Assessment & Plan: Thyroid function is WNL on current dose.  No current changes needed.   Lab Results  Component Value Date   TSH 1.31 04/23/2023      Autosomal dominant  hereditary hemochromatosis [facility]) Assessment & Plan: She deferred hematology referral in April  2019  Repeat iron and CBC needed   she is managing with periodic blood donation  Lab Results  Component Value Date   IRON 94 04/23/2023   TIBC 304 04/23/2023   FERRITIN 15 (L) 04/23/2023   Lab Results  Component Value Date   WBC 5.2 04/23/2023   HGB 13.2 04/23/2023   HCT 38.8 04/23/2023   MCV 96.4 04/23/2023   PLT 287.0 04/23/2023   Lab Results  Component Value Date   ALT 17 04/23/2023   AST 24 04/23/2023   ALKPHOS 56 04/23/2023   BILITOT 0.5 04/23/2023   Lab Results  Component Value Date   HGBA1C 5.3 11/01/2022      Other orders -     Levothyroxine Sodium; Take 1 tablet (75 mcg total) by mouth daily.  Dispense: 90 tablet; Refill: 1 -     Losartan Potassium; Take 2 tablets (100 mg total) by mouth daily.  Dispense: 180 tablet; Refill: 1 -     Temazepam; Take 1 capsule (15 mg total) by mouth at bedtime.  Dispense: 30 capsule; Refill: 5    No follow-ups on file.  Sherlene Shams, MD

## 2023-05-02 NOTE — Assessment & Plan Note (Signed)
Her renal artery duplex performed in June 2024 showed normal velocities in both renal arteries status post angioplasty of the right renal artery in the past. Did not appear to be any hemodynamically significant stenosis at current.

## 2023-05-02 NOTE — Patient Instructions (Signed)
You are doing well!  Continue donating blood  as you are doing  Suspend the "nitrous oxide" and vitamin suplements: they may be causing the flushing  If they don't stop,  let me know and I'll order the ZIO monitor

## 2023-05-02 NOTE — Assessment & Plan Note (Signed)
Currently Untreated due to statin  myalgia .  10 yr risk is > 20% and she has known atherosclerosis,  Trial of Zetia not tolerated either    Lab Results  Component Value Date   CHOL 245 (H) 04/23/2023   HDL 90.30 04/23/2023   LDLCALC 144 (H) 04/23/2023   LDLDIRECT 135.0 11/01/2022   TRIG 57.0 04/23/2023   CHOLHDL 3 04/23/2023

## 2023-05-04 NOTE — Assessment & Plan Note (Addendum)
With bilateral ABIs of 0.7 by [facility] screening.  She has been referred to Dr Wyn Quaker for formal evaluation and ABI's were normal jan 2023

## 2023-05-04 NOTE — Assessment & Plan Note (Signed)
She deferred hematology referral in April  2019  Repeat iron and CBC needed   she is managing with periodic blood donation  Lab Results  Component Value Date   IRON 94 04/23/2023   TIBC 304 04/23/2023   FERRITIN 15 (L) 04/23/2023   Lab Results  Component Value Date   WBC 5.2 04/23/2023   HGB 13.2 04/23/2023   HCT 38.8 04/23/2023   MCV 96.4 04/23/2023   PLT 287.0 04/23/2023   Lab Results  Component Value Date   ALT 17 04/23/2023   AST 24 04/23/2023   ALKPHOS 56 04/23/2023   BILITOT 0.5 04/23/2023   Lab Results  Component Value Date   HGBA1C 5.3 11/01/2022

## 2023-05-04 NOTE — Assessment & Plan Note (Signed)
Thyroid function is WNL on current dose.  No current changes needed.   Lab Results  Component Value Date   TSH 1.31 04/23/2023

## 2023-05-07 ENCOUNTER — Ambulatory Visit: Payer: Medicare HMO | Attending: Nurse Practitioner | Admitting: Nurse Practitioner

## 2023-05-07 ENCOUNTER — Encounter: Payer: Self-pay | Admitting: Nurse Practitioner

## 2023-05-07 VITALS — BP 168/90 | HR 68 | Ht 63.0 in | Wt 131.4 lb

## 2023-05-07 DIAGNOSIS — I773 Arterial fibromuscular dysplasia: Secondary | ICD-10-CM | POA: Diagnosis not present

## 2023-05-07 DIAGNOSIS — E782 Mixed hyperlipidemia: Secondary | ICD-10-CM | POA: Diagnosis not present

## 2023-05-07 DIAGNOSIS — I4719 Other supraventricular tachycardia: Secondary | ICD-10-CM | POA: Diagnosis not present

## 2023-05-07 DIAGNOSIS — I10 Essential (primary) hypertension: Secondary | ICD-10-CM | POA: Diagnosis not present

## 2023-05-07 NOTE — Progress Notes (Addendum)
Office Visit    Patient Name: Wendy Love Date of Encounter: 05/07/2023  Primary Care Provider:  Sherlene Shams, MD Primary Cardiologist:  Julien Nordmann, MD  Chief Complaint    82 y.o. female with a history of paroxysmal atrial tachycardia, hypertension, aortic atherosclerosis, peripheral arterial disease, hyperlipidemia, fibromuscular dysplasia, and renal artery stenosis status post right renal artery angioplasty, who presents for f/u r/t HTN.  Past Medical History    Past Medical History:  Diagnosis Date   Anxiety    Bronchitis    Cancer (HCC)    squamous cell   Chest pain    a. 11/2005 Cor CTA: Nl cors. Ca2+ = 0.  EF 56%.   Claudication [facility])    a. 11/2021 ABIs: R 1.09, L 1.01.   COVID-19 09/01/21   home test    Degenerative disc disease, lumbar    Depression    Diastolic dysfunction    a. 02/2022 Echo: EF 55-60%, no rwma, mild LVH, GrI DD, nl RV size/fxn, RVSP 19.26mmHg, Ao sclerosis w/o stenosis.   Endometriosis 1974   s/p abdominal surgery, 2nd surgery   Fibromuscular dysplasia (HCC)    High cholesterol    Hypertension    Hypothyroidism    Rhinosinusitis    Right renal artery stenosis (HCC)    a. 10/2021 s/p PTA/DCBA R renal artery; b. 11/2021 Renal duplex: 1-59% bilat RA stenoses; c. 02/2023 U/S: No significant RA stenosis.   Sciatica    Past Surgical History:  Procedure Laterality Date   ABDOMINAL HYSTERECTOMY  09/09/1998   APPENDECTOMY     CARPAL TUNNEL RELEASE     bilateral   CATARACT EXTRACTION W/PHACO Left 07/26/2020   Procedure: CATARACT EXTRACTION PHACO AND INTRAOCULAR LENS PLACEMENT (IOC) LEFT 6.30 01:10.2 9.0%;  Surgeon: Lockie Mola, MD;  Location: [facility];  Service: Ophthalmology;  Laterality: Left;   CATARACT EXTRACTION W/PHACO Right 10/18/2020   Procedure: CATARACT EXTRACTION PHACO AND INTRAOCULAR LENS PLACEMENT (IOC) RIGHT 9.18 01:14.6 12.3%;  Surgeon: Lockie Mola, MD;  Location: [facility];   Service: Ophthalmology;  Laterality: Right;   dental implant  2023   Endoscopy/colonoscopy  12/09/2010   Schatski's Ring , small hiatal hernia   hemilaminectomy  12/08/2008   L3-L4hemi, L4-L5 microdiskectomy   KNEE ARTHROSCOPY WITH LATERAL MENISECTOMY Right 04/08/2018   Procedure: KNEE ARTHROSCOPY WITH PARTIAL LATERAL MENISECTOMY and debridement.;  Surgeon: Christena Flake, MD;  Location: [facility];  Service: Orthopedics;  Laterality: Right;   REFRACTIVE SURGERY Right 06/2022   RENAL ANGIOGRAPHY N/A 10/25/2021   Procedure: RENAL ANGIOGRAPHY;  Surgeon: Annice Needy, MD;  Location: [facility] INVASIVE CV LAB;  Service: Cardiovascular;  Laterality: N/A;    Allergies  Allergies  Allergen Reactions   Atorvastatin Other (See Comments)    myalgias   Brompheniramine-Pseudoeph     REACTION: agitation, nervous, sleepless   Codeine     REACTION: GI Upset   Diphenhydramine Hcl     REACTION: nervous, sleepless, agitated   Hctz [Hydrochlorothiazide] Other (See Comments)    Severe hyponatremia   Loratadine     REACTION: nervous, sleeplessness   Other Other (See Comments)    Tape left "burn-type" marks on face after Cataract surgery   Tape     Tape left "burn-type" marks on face after Cataract surgery   Triamterene     Hyperkalemia and hyponatremia   Tramadol Hcl Rash    hyperactivity    History of Present Illness      81  y.o. y/o female with a history of paroxysmal atrial tachycardia, hypertension, aortic atherosclerosis, peripheral arterial disease, hyperlipidemia, fibromuscular dysplasia, and renal artery stenosis status post right renal artery angioplasty.  Wendy Love has a long history of difficult to control hypertension.  In 2012, she had intermittent chest heaviness associated with palpitations and flushing.  Coronary CT angiogram was performed and showed normal coronary arteries.  She has been managed with beta-blocker therapy for history of palpitations and tachycardia, and is  currently on carvedilol.  In the setting of difficult to control hypertension, she underwent renal artery duplex which showed increased velocities in the right renal artery and bilateral 1 to 59% stenoses.  Findings were concerning for fibromuscular dysplasia.  She subsequently underwent angiography and successful drug-coated balloon angioplasty of the right renal artery in February 2023.  Follow-up ultrasound March 2023 showed improved right renal artery flow and bilateral 1 to 59% stenosis, and more recent renal artery duplex in June 2024, showed no evidence of stenosis bilaterally.   Wendy Love has done well over the past year.  She does not experience chest pain or dyspnea and notes that her blood pressure typically runs in the 130s at home with occasional elevations into the 140s.  She admits to diet that is high in processed foods as she lives by herself and has not been particularly careful about her intake.  She plans to make more of an effort to cook from scratch and include healthier foods.  She denies palpitations, PND, orthopnea, dizziness, syncope, edema, or early satiety.  She expresses an interest in a cardiac CT to reevaluate calcium scoring to better understand risk.  Home Medications    Current Outpatient Medications  Medication Sig Dispense Refill   amLODipine (NORVASC) 5 MG tablet TAKE 1 TABLET (5 MG TOTAL) BY MOUTH DAILY. 90 tablet 1   ASPIRIN LOW DOSE 81 MG tablet TAKE 1 TABLET BY MOUTH EVERY DAY 120 tablet 3   b complex vitamins tablet Take 1 tablet by mouth daily.     CALCIUM-MAGNESIUM PO Take by mouth.     carvedilol (COREG) 3.125 MG tablet TAKE 1 TABLET BY MOUTH TWICE A DAY WITH A MEAL 180 tablet 3   Cholecalciferol (VITAMIN D3) 2000 UNITS TABS Take 5,000 Units by mouth daily.     Coenzyme Q10 (CO Q-10) 100 MG CAPS Take by mouth daily.     levothyroxine (SYNTHROID) 75 MCG tablet Take 1 tablet (75 mcg total) by mouth daily. 90 tablet 1   losartan (COZAAR) 50 MG tablet Take  2 tablets (100 mg total) by mouth daily. 180 tablet 1   melatonin 1 MG TABS tablet Take 2 mg by mouth at bedtime.     Multiple Vitamin (MULTIVITAMIN) tablet Take 1 tablet by mouth daily.     Omega-3 Fatty Acids (FISH OIL PO) Take by mouth daily.     PROBIOTIC CAPS Take by mouth daily.     QUERCETIN PO Take 560 mg by mouth daily.     temazepam (RESTORIL) 15 MG capsule Take 1 capsule (15 mg total) by mouth at bedtime. 30 capsule 5   tretinoin (RETIN-A) 0.025 % cream APPLY TO WHOLE FACE EACH NIGHT.  5   Turmeric (QC TUMERIC COMPLEX PO) Take by mouth.     vitamin C (ASCORBIC ACID) 500 MG tablet Take 1,000 mg by mouth daily.      Zinc 50 MG TABS Take 50 mg by mouth daily.     Magnesium 300 MG CAPS Take by mouth  daily. (Patient not taking: Reported on 05/07/2023)     No current facility-administered medications for this visit.     Review of Systems    She denies chest pain, palpitations, dyspnea, pnd, orthopnea, n, v, dizziness, syncope, edema, weight gain, or early satiety.  All other systems reviewed and are otherwise negative except as noted above.    Physical Exam    VS:  BP (!) 168/90   Pulse 68   Ht 5\' 3"  (1.6 m)   Wt 131 lb 6.4 oz (59.6 kg)   SpO2 96%   BMI 23.28 kg/m  , BMI Body mass index is 23.28 kg/m.     Vitals:   05/07/23 0928 05/07/23 1219  BP: (!) 160/98 (!) 168/90  Pulse: 68   SpO2: 96%     GEN: Well nourished, well developed, in no acute distress. HEENT: normal. Neck: Supple, no JVD, carotid bruits, or masses. Cardiac: RRR, no murmurs, rubs, or gallops. No clubbing, cyanosis, edema.  Radials 2+/PT 2+ and equal bilaterally.  Respiratory:  Respirations regular and unlabored, clear to auscultation bilaterally. GI: Soft, nontender, nondistended, BS + x 4. MS: no deformity or atrophy. Skin: warm and dry, no rash. Neuro:  Strength and sensation are intact. Psych: Normal affect.  Accessory Clinical Findings    ECG personally reviewed by me today - EKG  Interpretation Date/Time:  Wednesday May 07 2023 09:33:31 EDT Ventricular Rate:  68 PR Interval:  190 QRS Duration:  86 QT Interval:  364 QTC Calculation: 387 R Axis:   -24  Text Interpretation: Normal sinus rhythm Normal ECG When compared with ECG of 20-Apr-2009 03:56, Questionable change in QRS axis QT has shortened Confirmed by Nicolasa Ducking [phone]) on 05/07/2023 9:45:51 AM  - no acute changes.  Lab Results  Component Value Date   WBC 5.2 04/23/2023   HGB 13.2 04/23/2023   HCT 38.8 04/23/2023   MCV 96.4 04/23/2023   PLT 287.0 04/23/2023   Lab Results  Component Value Date   CREATININE 0.63 04/23/2023   BUN 11 04/23/2023   NA 141 04/23/2023   K 4.1 04/23/2023   CL 104 04/23/2023   CO2 31 04/23/2023   Lab Results  Component Value Date   ALT 17 04/23/2023   AST 24 04/23/2023   ALKPHOS 56 04/23/2023   BILITOT 0.5 04/23/2023   Lab Results  Component Value Date   CHOL 245 (H) 04/23/2023   HDL 90.30 04/23/2023   LDLCALC 144 (H) 04/23/2023   LDLDIRECT 135.0 11/01/2022   TRIG 57.0 04/23/2023   CHOLHDL 3 04/23/2023    Lab Results  Component Value Date   HGBA1C 5.3 11/01/2022    Assessment & Plan    1.  Primary hypertension: History of difficult to control hypertension status post right renal artery angioplasty in February 2023.  Pressure elevated today on 2 consecutive recordings (168/90) though she notes at home, she is typically in the 130s with occasional readings in the 140s.  She admits to a diet high in processed foods.  We discussed the importance of a whole foods diet that minimizes meat products and processed foods.  Also discussed the importance of at least 30 minutes of activity 5 days a week.  She plans to implement lifestyle changes and will continue to watch her blood pressures at home.  She believes elevations today are simply due to whitecoat hypertension, and will contact us if pressures are consistently elevated at home.  I note that she did not  tolerate higher dose  of amlodipine in the past due to lower extremity swelling and also did not tolerate higher dose of beta-blocker due to fatigue.  She is already on maximal dose losartan.  Spironolactone might be a reasonable additional agent if necessary.  2.  Fibromuscular dysplasia/right renal artery stenosis: Followed by vascular surgery with recent renal arterial duplex in June showing no significant disease.  She is on aspirin therapy.  3.  Paroxysmal atrial tachycardia: Quiescent on carvedilol therapy.  4.  Hyperlipidemia: LDL 144 on August 14 with total cholesterol of 245 and HDL of 90.30.  Previously offered Zetia though she never tried.  Interested in cardiac CT for calcium scoring to help with risk stratification.  She has known prior history of aortic atherosclerosis as well as calcification of cerebellar vessels on prior head CT.  At age 14, she has aged out of 10-year risk calculator.  We agreed to pursue cardiac CT with the understanding that if she has any calcium, that we would again recommend more aggressive lipid therapy with either ezetimibe, bempedoic acid, or PCSK9 inhibitor if she does not tolerate the former two or if lipid lowering is inadequate.  5.  Disposition: Follow-up with cardiac CT for calcium scoring.  Follow-up in clinic in 6 months or sooner if necessary.  Nicolasa Ducking, NP 05/07/2023, 12:29 PM

## 2023-05-07 NOTE — Patient Instructions (Signed)
Medication Instructions:  Your Physician recommend you continue on your current medication as directed.    *If you need a refill on your cardiac medications before your next appointment, please call your pharmacy*   Lab Work: NONE If you have labs (blood work) drawn today and your tests are completely normal, you will receive your results only by: MyChart Message (if you have MyChart) OR A paper copy in the mail If you have any lab test that is abnormal or we need to change your treatment, we will call you to review the results.   Testing/Procedures: CT coronary calcium score.   - $99 out of pocket cost at the time of your test - Call [phone] to schedule at your convenience.  [facility] [address] [place], [place] [address]   Coronary CalciumScan A coronary calcium scan is an imaging test used to look for deposits of calcium and other fatty materials (plaques) in the inner lining of the blood vessels of the heart (coronary arteries). These deposits of calcium and plaques can partly clog and narrow the coronary arteries without producing any symptoms or warning signs. This puts a person at risk for a heart attack. This test can detect these deposits before symptoms develop. Tell a health care provider about: Any allergies you have. All medicines you are taking, including vitamins, herbs, eye drops, creams, and over-the-counter medicines. Any problems you or family members have had with anesthetic medicines. Any blood disorders you have. Any surgeries you have had. Any medical conditions you have. Whether you are pregnant or may be pregnant. What are the risks? Generally, this is a safe procedure. However, problems may occur, including: Harm to a pregnant woman and her unborn baby. This test involves the use of radiation. Radiation exposure can be dangerous to a pregnant woman and her unborn baby. If you are pregnant, you  generally should not have this procedure done. Slight increase in the risk of cancer. This is because of the radiation involved in the test. What happens before the procedure? No preparation is needed for this procedure. What happens during the procedure? You will undress and remove any jewelry around your neck or chest. You will put on a hospital gown. Sticky electrodes will be placed on your chest. The electrodes will be connected to an electrocardiogram (ECG) machine to record a tracing of the electrical activity of your heart. A CT scanner will take pictures of your heart. During this time, you will be asked to lie still and hold your breath for 2-3 seconds while a picture of your heart is being taken. The procedure may vary among health care providers and hospitals. What happens after the procedure? You can get dressed. You can return to your normal activities. It is up to you to get the results of your test. Ask your health care provider, or the department that is doing the test, when your results will be ready. Summary A coronary calcium scan is an imaging test used to look for deposits of calcium and other fatty materials (plaques) in the inner lining of the blood vessels of the heart (coronary arteries). Generally, this is a safe procedure. Tell your health care provider if you are pregnant or may be pregnant. No preparation is needed for this procedure. A CT scanner will take pictures of your heart. You can return to your normal activities after the scan is done. This information is not intended to replace advice given to you by  your health care provider. Make sure you discuss any questions you have with your health care provider.  Follow-Up: At [facility], you and your health needs are our priority.  As part of our continuing mission to provide you with exceptional heart care, we have created designated Provider Care Teams.  These Care Teams include your primary  Cardiologist (physician) and Advanced Practice Providers (APPs -  Physician Assistants and Nurse Practitioners) who all work together to provide you with the care you need, when you need it.  We recommend signing up for the patient portal called "MyChart".  Sign up information is provided on this After Visit Summary.  MyChart is used to connect with patients for Virtual Visits (Telemedicine).  Patients are able to view lab/test results, encounter notes, upcoming appointments, etc.  Non-urgent messages can be sent to your provider as well.   To learn more about what you can do with MyChart, go to [email or web address].    Your next appointment:   6 month(s)  Provider:   You may see Julien Nordmann, MD or one of the following Advanced Practice Providers on your designated Care Team:   Nicolasa Ducking, NP

## 2023-05-16 ENCOUNTER — Ambulatory Visit
Admission: RE | Admit: 2023-05-16 | Discharge: 2023-05-16 | Disposition: A | Discharge status: Home / Self Care | Payer: Medicare HMO | Source: Ambulatory Visit | Attending: Nurse Practitioner | Admitting: Nurse Practitioner

## 2023-05-16 DIAGNOSIS — E782 Mixed hyperlipidemia: Secondary | ICD-10-CM | POA: Insufficient documentation

## 2023-05-19 ENCOUNTER — Telehealth: Payer: Self-pay | Admitting: Nurse Practitioner

## 2023-05-19 NOTE — Telephone Encounter (Signed)
Patient returned RN's call. 

## 2023-05-19 NOTE — Telephone Encounter (Signed)
Creig Hines, NP 05/19/2023  1:54 PM EDT     Cardiac calcium score is elevated @ 63.3 (previously zero).  This places Wendy Love in the 36th percentile for her age and sex.  As previously advised, with this finding, would recommend that she take zetia 10 mg daily.  Blood pressure, lipid mgmt, regular exercise, and ongoing maintenance of a healthy weight with lots of fruits, veggies, legumes, and fiber, remain very important, regardless of calcium score.    Patient notified.  She does not want to start Zetia at this time but will call office back if she would like to take it.  She is asking about radiologist portion of study.  I let her know those results are not available yet but we would contact her if there was anything abnormal once results available

## 2023-07-16 ENCOUNTER — Other Ambulatory Visit: Payer: Self-pay | Admitting: Internal Medicine

## 2023-07-29 DIAGNOSIS — H40003 Preglaucoma, unspecified, bilateral: Secondary | ICD-10-CM | POA: Diagnosis not present

## 2023-08-05 DIAGNOSIS — H40003 Preglaucoma, unspecified, bilateral: Secondary | ICD-10-CM | POA: Diagnosis not present

## 2023-08-05 DIAGNOSIS — H43813 Vitreous degeneration, bilateral: Secondary | ICD-10-CM | POA: Diagnosis not present

## 2023-08-05 DIAGNOSIS — Z961 Presence of intraocular lens: Secondary | ICD-10-CM | POA: Diagnosis not present

## 2023-08-19 ENCOUNTER — Ambulatory Visit (INDEPENDENT_AMBULATORY_CARE_PROVIDER_SITE_OTHER): Payer: Medicare HMO

## 2023-08-19 ENCOUNTER — Encounter (INDEPENDENT_AMBULATORY_CARE_PROVIDER_SITE_OTHER): Payer: Self-pay | Admitting: Vascular Surgery

## 2023-08-19 ENCOUNTER — Ambulatory Visit (INDEPENDENT_AMBULATORY_CARE_PROVIDER_SITE_OTHER): Payer: Medicare HMO | Admitting: Vascular Surgery

## 2023-08-19 VITALS — BP 183/85 | HR 74 | Resp 16 | Wt 129.4 lb

## 2023-08-19 DIAGNOSIS — I773 Arterial fibromuscular dysplasia: Secondary | ICD-10-CM | POA: Diagnosis not present

## 2023-08-19 DIAGNOSIS — I150 Renovascular hypertension: Secondary | ICD-10-CM

## 2023-08-19 DIAGNOSIS — E7800 Pure hypercholesterolemia, unspecified: Secondary | ICD-10-CM

## 2023-08-19 NOTE — Progress Notes (Signed)
MRN : [id number]  Wendy Love is a 82 y.o. ([birth year]) female who presents with chief complaint of  Chief Complaint  Patient presents with   Follow-up    6 month renal follow up  .  History of Present Illness: Patient returns today in follow up of her fibromuscular dysplasia in the renal artery.  She underwent angioplasty of the right renal artery for fibromuscular dysplasia causing significant stenosis resulting in renovascular hypertension last year.  Her blood pressure has generally been better although it is always high when she comes to the doctor.  She denies any worsening renal function to her knowledge.  Duplex today showed no elevated velocities in either renal artery indicating patency of the angioplasty and no hemodynamically significant stenosis in the left renal artery.  Current Outpatient Medications  Medication Sig Dispense Refill   amLODipine (NORVASC) 5 MG tablet TAKE 1 TABLET (5 MG TOTAL) BY MOUTH DAILY. 90 tablet 1   ASPIRIN LOW DOSE 81 MG tablet TAKE 1 TABLET BY MOUTH EVERY DAY 120 tablet 3   b complex vitamins tablet Take 1 tablet by mouth daily.     CALCIUM-MAGNESIUM PO Take by mouth.     carvedilol (COREG) 3.125 MG tablet TAKE 1 TABLET BY MOUTH TWICE A DAY WITH FOOD 180 tablet 3   Cholecalciferol (VITAMIN D3) 2000 UNITS TABS Take 5,000 Units by mouth daily.     Coenzyme Q10 (CO Q-10) 100 MG CAPS Take by mouth daily.     levothyroxine (SYNTHROID) 75 MCG tablet Take 1 tablet (75 mcg total) by mouth daily. 90 tablet 1   losartan (COZAAR) 50 MG tablet Take 2 tablets (100 mg total) by mouth daily. 180 tablet 1   melatonin 1 MG TABS tablet Take 2 mg by mouth at bedtime.     Multiple Vitamin (MULTIVITAMIN) tablet Take 1 tablet by mouth daily.     Omega-3 Fatty Acids (FISH OIL PO) Take by mouth daily.     PROBIOTIC CAPS Take by mouth daily.     QUERCETIN PO Take 560 mg by mouth daily.     temazepam (RESTORIL) 15 MG capsule Take 1 capsule (15 mg total) by mouth  at bedtime. 30 capsule 5   tretinoin (RETIN-A) 0.025 % cream APPLY TO WHOLE FACE EACH NIGHT.  5   Turmeric (QC TUMERIC COMPLEX PO) Take by mouth.     vitamin C (ASCORBIC ACID) 500 MG tablet Take 1,000 mg by mouth daily.      Zinc 50 MG TABS Take 50 mg by mouth daily.     Magnesium 300 MG CAPS Take by mouth daily. (Patient not taking: Reported on 05/07/2023)     No current facility-administered medications for this visit.    Past Medical History:  Diagnosis Date   Anxiety    Bronchitis    Cancer (HCC)    squamous cell   Chest pain    a. 11/2005 Cor CTA: Nl cors. Ca2+ = 0.  EF 56%.   Claudication [facility])    a. 11/2021 ABIs: R 1.09, L 1.01.   COVID-19 09/01/21   home test    Degenerative disc disease, lumbar    Depression    Diastolic dysfunction    a. 02/2022 Echo: EF 55-60%, no rwma, mild LVH, GrI DD, nl RV size/fxn, RVSP 19.82mmHg, Ao sclerosis w/o stenosis.   Endometriosis 1974   s/p abdominal surgery, 2nd surgery   Fibromuscular dysplasia (HCC)    High cholesterol    Hypertension  Hypothyroidism    Rhinosinusitis    Right renal artery stenosis (HCC)    a. 10/2021 s/p PTA/DCBA R renal artery; b. 11/2021 Renal duplex: 1-59% bilat RA stenoses; c. 02/2023 U/S: No significant RA stenosis.   Sciatica     Past Surgical History:  Procedure Laterality Date   ABDOMINAL HYSTERECTOMY  09/09/1998   APPENDECTOMY     CARPAL TUNNEL RELEASE     bilateral   CATARACT EXTRACTION W/PHACO Left 07/26/2020   Procedure: CATARACT EXTRACTION PHACO AND INTRAOCULAR LENS PLACEMENT (IOC) LEFT 6.30 01:10.2 9.0%;  Surgeon: Lockie Mola, MD;  Location: [facility];  Service: Ophthalmology;  Laterality: Left;   CATARACT EXTRACTION W/PHACO Right 10/18/2020   Procedure: CATARACT EXTRACTION PHACO AND INTRAOCULAR LENS PLACEMENT (IOC) RIGHT 9.18 01:14.6 12.3%;  Surgeon: Lockie Mola, MD;  Location: [facility];  Service: Ophthalmology;  Laterality: Right;   dental implant  2023    Endoscopy/colonoscopy  12/09/2010   Schatski's Ring , small hiatal hernia   hemilaminectomy  12/08/2008   L3-L4hemi, L4-L5 microdiskectomy   KNEE ARTHROSCOPY WITH LATERAL MENISECTOMY Right 04/08/2018   Procedure: KNEE ARTHROSCOPY WITH PARTIAL LATERAL MENISECTOMY and debridement.;  Surgeon: Christena Flake, MD;  Location: [facility];  Service: Orthopedics;  Laterality: Right;   REFRACTIVE SURGERY Right 06/2022   RENAL ANGIOGRAPHY N/A 10/25/2021   Procedure: RENAL ANGIOGRAPHY;  Surgeon: Annice Needy, MD;  Location: [facility] INVASIVE CV LAB;  Service: Cardiovascular;  Laterality: N/A;     Social History   Tobacco Use   Smoking status: Never   Smokeless tobacco: Never   Tobacco comments:    smoked socially in college  Vaping Use   Vaping status: Never Used  Substance Use Topics   Alcohol use: No   Drug use: No      Family History  Problem Relation Age of Onset   Ovarian cancer Mother    Cancer Mother 26       ovarian cancer, treated by [facility]   Heart disease Father    Cancer Sister 11       BREAST   Breast cancer Sister      Allergies  Allergen Reactions   Atorvastatin Other (See Comments)    myalgias   Brompheniramine-Pseudoeph     REACTION: agitation, nervous, sleepless   Codeine     REACTION: GI Upset   Diphenhydramine Hcl     REACTION: nervous, sleepless, agitated   Hctz [Hydrochlorothiazide] Other (See Comments)    Severe hyponatremia   Loratadine     REACTION: nervous, sleeplessness   Other Other (See Comments)    Tape left "burn-type" marks on face after Cataract surgery   Tape     Tape left "burn-type" marks on face after Cataract surgery   Triamterene     Hyperkalemia and hyponatremia   Tramadol Hcl Rash    hyperactivity     REVIEW OF SYSTEMS (Negative unless checked)   Constitutional: [] Weight loss  [] Fever  [] Chills Cardiac: [] Chest pain   [] Chest pressure   [] Palpitations   [] Shortness of breath when laying flat   [] Shortness of breath at  rest   [] Shortness of breath with exertion. Vascular:  [] Pain in legs with walking   [] Pain in legs at rest   [] Pain in legs when laying flat   [] Claudication   [] Pain in feet when walking  [] Pain in feet at rest  [] Pain in feet when laying flat   [] History of DVT   [] Phlebitis   [] Swelling in legs   []   Varicose veins   [] Non-healing ulcers Pulmonary:   [] Uses home oxygen   [] Productive cough   [] Hemoptysis   [] Wheeze  [] COPD   [] Asthma Neurologic:  [] Dizziness  [] Blackouts   [] Seizures   [] History of stroke   [] History of TIA  [] Aphasia   [] Temporary blindness   [] Dysphagia   [] Weakness or numbness in arms   [] Weakness or numbness in legs Musculoskeletal:  [x] Arthritis   [] Joint swelling   [] Joint pain   [x] Low back pain Hematologic:  [] Easy bruising  [] Easy bleeding   [] Hypercoagulable state   [] Anemic   Gastrointestinal:  [] Blood in stool   [] Vomiting blood  [] Gastroesophageal reflux/heartburn   [] Abdominal pain Genitourinary:  [] Chronic kidney disease   [] Difficult urination  [] Frequent urination  [] Burning with urination   [] Hematuria Skin:  [] Rashes   [] Ulcers   [] Wounds Psychological:  [x] History of anxiety   []  History of major depression.  Physical Examination  BP (!) 183/85 (BP Location: Left Arm)   Pulse 74   Resp 16   Wt 129 lb 6.4 oz (58.7 kg)   BMI 22.92 kg/m  Gen:  WD/WN, NAD.  Appears younger than stated age. Head: [place]/AT, No temporalis wasting. Ear/Nose/Throat: Hearing grossly intact, nares w/o erythema or drainage Eyes: Conjunctiva clear. Sclera non-icteric Neck: Supple.  Trachea midline Pulmonary:  Good air movement, no use of accessory muscles.  Cardiac: RRR, no JVD Vascular:  Vessel Right Left  Radial Palpable Palpable                                   Gastrointestinal: soft, non-tender/non-distended. No guarding/reflex.  Musculoskeletal: M/S 5/5 throughout.  No deformity or atrophy.  No edema. Neurologic: Sensation grossly intact in extremities.   Symmetrical.  Speech is fluent.  Psychiatric: Judgment intact, Mood & affect appropriate for pt's clinical situation. Dermatologic: No rashes or ulcers noted.  No cellulitis or open wounds.      Labs No results found for this or any previous visit (from the past 2160 hour(s)).  Radiology No results found.  Assessment/Plan  Fibromuscular dysplasia of renal artery (HCC) Duplex today showed no elevated velocities in either renal artery indicating patency of the angioplasty and no hemodynamically significant stenosis in the left renal artery. Doing fairly well clinically.  Still has some hypertension and she says this is worse when she goes to the doctor.  Continue aspirin therapy.  Recheck with duplex in 6 months.  Renovascular hypertension Blood pressure control is improved after renal artery intervention on the right last year.  I will defer to her primary care physician whether or not she can reduce the medications    Hyperlipidemia lipid control important in reducing the progression of atherosclerotic disease. Continue statin therapy  Festus Barren, MD  08/19/2023 10:16 AM    This note was created with Dragon medical transcription system.  Any errors from dictation are purely unintentional

## 2023-08-19 NOTE — Assessment & Plan Note (Signed)
Duplex today showed no elevated velocities in either renal artery indicating patency of the angioplasty and no hemodynamically significant stenosis in the left renal artery. Doing fairly well clinically.  Still has some hypertension and she says this is worse when she goes to the doctor.  Continue aspirin therapy.  Recheck with duplex in 6 months.

## 2023-10-02 DIAGNOSIS — L821 Other seborrheic keratosis: Secondary | ICD-10-CM | POA: Diagnosis not present

## 2023-10-02 DIAGNOSIS — D485 Neoplasm of uncertain behavior of skin: Secondary | ICD-10-CM | POA: Diagnosis not present

## 2023-10-02 DIAGNOSIS — L820 Inflamed seborrheic keratosis: Secondary | ICD-10-CM | POA: Diagnosis not present

## 2023-10-02 DIAGNOSIS — L538 Other specified erythematous conditions: Secondary | ICD-10-CM | POA: Diagnosis not present

## 2023-10-28 ENCOUNTER — Encounter: Payer: Self-pay | Admitting: Nurse Practitioner

## 2023-10-28 ENCOUNTER — Ambulatory Visit: Payer: Medicare HMO | Attending: Nurse Practitioner | Admitting: Nurse Practitioner

## 2023-10-28 VITALS — BP 183/92 | HR 68 | Ht 63.0 in | Wt 131.8 lb

## 2023-10-28 DIAGNOSIS — I4719 Other supraventricular tachycardia: Secondary | ICD-10-CM

## 2023-10-28 DIAGNOSIS — I10 Essential (primary) hypertension: Secondary | ICD-10-CM | POA: Diagnosis not present

## 2023-10-28 DIAGNOSIS — E782 Mixed hyperlipidemia: Secondary | ICD-10-CM

## 2023-10-28 DIAGNOSIS — I773 Arterial fibromuscular dysplasia: Secondary | ICD-10-CM | POA: Diagnosis not present

## 2023-10-28 NOTE — Patient Instructions (Signed)
 Medication Instructions:  No changes at this time.   *If you need a refill on your cardiac medications before your next appointment, please call your pharmacy*   Lab Work: None  If you have labs (blood work) drawn today and your tests are completely normal, you will receive your results only by: MyChart Message (if you have MyChart) OR A paper copy in the mail If you have any lab test that is abnormal or we need to change your treatment, we will call you to review the results.   Testing/Procedures: None   Follow-Up: At [facility], you and your health needs are our priority.  As part of our continuing mission to provide you with exceptional heart care, we have created designated Provider Care Teams.  These Care Teams include your primary Cardiologist (physician) and Advanced Practice Providers (APPs -  Physician Assistants and Nurse Practitioners) who all work together to provide you with the care you need, when you need it.   Your next appointment:   6 month(s)  Provider:   Julien Nordmann, MD or Nicolasa Ducking, NP

## 2023-10-28 NOTE — Progress Notes (Signed)
 Office Visit    Patient Name: Wendy Love Date of Encounter: 10/28/2023  Primary Care Provider:  Sherlene Shams, MD Primary Cardiologist:  Julien Nordmann, MD  Chief Complaint    83 y.o. female  with a history of paroxysmal atrial tachycardia, hypertension, aortic atherosclerosis, peripheral arterial disease, hyperlipidemia, fibromuscular dysplasia, and renal artery stenosis status post right renal artery angioplasty, who presents for f/u r/t HTN.   Past Medical History  Subjective   Past Medical History:  Diagnosis Date   Anxiety    Bronchitis    Cancer (HCC)    squamous cell   Claudication (HCC)    a. 11/2021 ABIs: R 1.09, L 1.01.   Coronary artery calcification seen on CT scan    a. 11/2005 Cor CTA: Nl cors. Ca2+ = 0.  EF 56%; b. 05/2023 Cardiac CT: Ca2+ = 63.3 (36th%'ile).   COVID-19 09/01/21   home test    Degenerative disc disease, lumbar    Depression    Diastolic dysfunction    a. 02/2022 Echo: EF 55-60%, no rwma, mild LVH, GrI DD, nl RV size/fxn, RVSP 19.89mmHg, Ao sclerosis w/o stenosis.   Endometriosis 1974   s/p abdominal surgery, 2nd surgery   Fibromuscular dysplasia (HCC)    High cholesterol    Hypertension    Hypothyroidism    Rhinosinusitis    Right renal artery stenosis (HCC)    a. 10/2021 s/p PTA/DCBA R renal artery; b. 11/2021 Renal duplex: 1-59% bilat RA stenoses; c. 02/2023 U/S: No significant RA stenosis.   Sciatica    Past Surgical History:  Procedure Laterality Date   ABDOMINAL HYSTERECTOMY  09/09/1998   APPENDECTOMY     CARPAL TUNNEL RELEASE     bilateral   CATARACT EXTRACTION W/PHACO Left 07/26/2020   Procedure: CATARACT EXTRACTION PHACO AND INTRAOCULAR LENS PLACEMENT (IOC) LEFT 6.30 01:10.2 9.0%;  Surgeon: Lockie Mola, MD;  Location: [facility];  Service: Ophthalmology;  Laterality: Left;   CATARACT EXTRACTION W/PHACO Right 10/18/2020   Procedure: CATARACT EXTRACTION PHACO AND INTRAOCULAR LENS PLACEMENT (IOC) RIGHT  9.18 01:14.6 12.3%;  Surgeon: Lockie Mola, MD;  Location: [facility];  Service: Ophthalmology;  Laterality: Right;   dental implant  2023   Endoscopy/colonoscopy  12/09/2010   Schatski's Ring , small hiatal hernia   hemilaminectomy  12/08/2008   L3-L4hemi, L4-L5 microdiskectomy   KNEE ARTHROSCOPY WITH LATERAL MENISECTOMY Right 04/08/2018   Procedure: KNEE ARTHROSCOPY WITH PARTIAL LATERAL MENISECTOMY and debridement.;  Surgeon: Christena Flake, MD;  Location: [facility];  Service: Orthopedics;  Laterality: Right;   REFRACTIVE SURGERY Right 06/2022   RENAL ANGIOGRAPHY N/A 10/25/2021   Procedure: RENAL ANGIOGRAPHY;  Surgeon: Annice Needy, MD;  Location: [facility] INVASIVE CV LAB;  Service: Cardiovascular;  Laterality: N/A;    Allergies  Allergies  Allergen Reactions   Atorvastatin Other (See Comments)    myalgias   Brompheniramine-Pseudoeph     REACTION: agitation, nervous, sleepless   Codeine     REACTION: GI Upset   Diphenhydramine Hcl     REACTION: nervous, sleepless, agitated   Hctz [Hydrochlorothiazide] Other (See Comments)    Severe hyponatremia   Loratadine     REACTION: nervous, sleeplessness   Other Other (See Comments)    Tape left "burn-type" marks on face after Cataract surgery   Tape     Tape left "burn-type" marks on face after Cataract surgery   Triamterene     Hyperkalemia and hyponatremia   Tramadol Hcl Rash  hyperactivity      History of Present Illness      83 y.o. female with a history of paroxysmal atrial tachycardia, hypertension, aortic atherosclerosis, peripheral arterial disease, hyperlipidemia, fibromuscular dysplasia, and renal artery stenosis status post right renal artery angioplasty.  [name] has a long history of difficult to control hypertension.  In 2007, she had intermittent chest heaviness associated with palpitations and flushing.  Coronary CT angiogram was performed and showed normal coronary arteries.  She has been  managed with beta-blocker therapy for history of palpitations and tachycardia, and is currently on carvedilol.  In the setting of difficult to control hypertension, she underwent renal artery duplex which showed increased velocities in the right renal artery and bilateral 1 to 59% stenoses.  Findings were concerning for fibromuscular dysplasia.  She subsequently underwent angiography and successful drug-coated balloon angioplasty of the right renal artery in February 2023.  Follow-up ultrasound 08/2023 with persistent 1 to 59% bilateral renal artery stenoses.   [name] was last seen in cardiology clinic in August 2024, at which time blood pressure was elevated.  She noted a diet high in processed foods but felt that elevated blood pressures were secondary to whitecoat hypertension.  She requested a cardiac CT to reevaluate coronary calcium, and this was performed and showed a calcium score of 63.3 (36 percentile).  Encouraged that she consider initiating Zetia in the setting of known hyperlipidemia with prior statin intolerance, however she wished to defer.  Since then, she says that she has done well.  She remains active and walks just about every other day without symptoms or limitations.  She sometimes checks her blood pressure at home and notes that it typically runs in the 150 range.  She denies chest pain, dyspnea, palpitations, PND, orthopnea, dizziness, syncope, edema, or early satiety.  We did discuss her cardiac CT results, and at this time, she prefers to avoid cholesterol medicine, and will speak it over with her primary care provider. Objective  Home Medications    Current Outpatient Medications  Medication Sig Dispense Refill   amLODipine (NORVASC) 5 MG tablet TAKE 1 TABLET (5 MG TOTAL) BY MOUTH DAILY. 90 tablet 1   ASPIRIN LOW DOSE 81 MG tablet TAKE 1 TABLET BY MOUTH EVERY DAY 120 tablet 3   b complex vitamins tablet Take 1 tablet by mouth daily.     CALCIUM-MAGNESIUM PO Take by mouth.      carvedilol (COREG) 3.125 MG tablet TAKE 1 TABLET BY MOUTH TWICE A DAY WITH FOOD 180 tablet 3   Cholecalciferol (VITAMIN D3) 2000 UNITS TABS Take 5,000 Units by mouth daily.     Coenzyme Q10 (CO Q-10) 100 MG CAPS Take by mouth daily.     levothyroxine (SYNTHROID) 75 MCG tablet Take 1 tablet (75 mcg total) by mouth daily. 90 tablet 1   losartan (COZAAR) 50 MG tablet Take 2 tablets (100 mg total) by mouth daily. 180 tablet 1   Magnesium 300 MG CAPS Take by mouth daily.     melatonin 1 MG TABS tablet Take 2 mg by mouth at bedtime.     Multiple Vitamin (MULTIVITAMIN) tablet Take 1 tablet by mouth daily.     Omega-3 Fatty Acids (FISH OIL PO) Take by mouth daily.     PROBIOTIC CAPS Take by mouth daily.     QUERCETIN PO Take 560 mg by mouth daily.     temazepam (RESTORIL) 15 MG capsule Take 1 capsule (15 mg total) by mouth at bedtime. 30  capsule 5   tretinoin (RETIN-A) 0.025 % cream APPLY TO WHOLE FACE EACH NIGHT.  5   Turmeric (QC TUMERIC COMPLEX PO) Take by mouth.     vitamin C (ASCORBIC ACID) 500 MG tablet Take 1,000 mg by mouth daily.      Zinc 50 MG TABS Take 50 mg by mouth daily.     No current facility-administered medications for this visit.     Physical Exam    VS:  BP (!) 183/92   Pulse 68   Ht 5\' 3"  (1.6 m)   Wt 131 lb 12.8 oz (59.8 kg)   SpO2 97%   BMI 23.35 kg/m  , BMI Body mass index is 23.35 kg/m.   Vitals:   10/28/23 1103 10/28/23 1314  BP: (!) 192/104 (!) 183/92  Pulse: 68   SpO2: 97%         GEN: Well nourished, well developed, in no acute distress. HEENT: normal. Neck: Supple, no JVD, carotid bruits, or masses. Cardiac: RRR, no murmurs, rubs, or gallops. No clubbing, cyanosis, edema.  Radials 2+/PT 2+ and equal bilaterally.  Respiratory:  Respirations regular and unlabored, clear to auscultation bilaterally. GI: Soft, nontender, nondistended, BS + x 4. MS: no deformity or atrophy. Skin: warm and dry, no rash. Neuro:  Strength and sensation are  intact. Psych: Normal affect.  Accessory Clinical Findings    ECG personally reviewed by me today - EKG Interpretation Date/Time:  Tuesday October 28 2023 11:08:00 EST Ventricular Rate:  68 PR Interval:  190 QRS Duration:  88 QT Interval:  374 QTC Calculation: 397 R Axis:   1  Text Interpretation: Normal sinus rhythm Normal ECG Confirmed by Nicolasa Ducking [phone]) on 10/28/2023 11:13:53 AM cardiac CT last fall- no acute changes.  Lab Results  Component Value Date   WBC 5.2 04/23/2023   HGB 13.2 04/23/2023   HCT 38.8 04/23/2023   MCV 96.4 04/23/2023   PLT 287.0 04/23/2023   Lab Results  Component Value Date   CREATININE 0.63 04/23/2023   BUN 11 04/23/2023   NA 141 04/23/2023   K 4.1 04/23/2023   CL 104 04/23/2023   CO2 31 04/23/2023   Lab Results  Component Value Date   ALT 17 04/23/2023   AST 24 04/23/2023   ALKPHOS 56 04/23/2023   BILITOT 0.5 04/23/2023   Lab Results  Component Value Date   CHOL 245 (H) 04/23/2023   HDL 90.30 04/23/2023   LDLCALC 144 (H) 04/23/2023   LDLDIRECT 135.0 11/01/2022   TRIG 57.0 04/23/2023   CHOLHDL 3 04/23/2023    Lab Results  Component Value Date   HGBA1C 5.3 11/01/2022   Lab Results  Component Value Date   TSH 1.31 04/23/2023       Assessment & Plan    1.  Primary hypertension: History of difficult to control hypertension status post right renal artery angioplasty in February 2023 with stable findings on renal ultrasound in December 2024.  Pressure elevated today at 192/104 and 183/92 on repeat.  She says she typically runs in the 150 range at home and thinks she is just higher today due to whitecoat hypertension.  We discussed that trending in the 150s is already quite elevated.  She is currently on losartan 100 mg daily.  She did not previously tolerate higher doses of amlodipine due to lower extremity swelling, and did not tolerate higher doses of beta-blocker due to fatigue.  I note that she previously had hyperkalemia  with triamterene, thus spironolactone  is not likely a good option.  We discussed potentially switching her from losartan to valsartan for the potential benefits of additional blood pressure lowering and she was receptive to this idea but prefers to talk with her primary care provider first.  She has primary care follow-up in the next few weeks.  I encouraged her to check her blood pressure daily and record trends so that that information is available for that primary care visit.  2.  Fibromuscular dysplasia/right renal artery stenosis: Followed by vascular surgery with renal duplex in December 2024 showing stable, 1 to 59% bilateral stenoses.  She is on aspirin therapy.  3.  Paroxysmal atrial tachycardia: Quiescent on carvedilol therapy.  4.  Hyperlipidemia: LDL of 144 in August 2024 with a total cholesterol of 245.  Coronary calcium score 63.3 in September 2024.  Again discussed role of lipid-lowering and some with prior history of coronary calcium.  Previously statin intolerant.  Could consider ezetimibe or bempedoic acid plus ezetimibe.  She would not be interested in PCSK9 inhibitor.  Ultimately, she prefers to talk it over with her primary care provider.  5.  Disposition: Follow-up in 6 months or sooner if necessary. Nicolasa Ducking, NP 10/28/2023, 1:16 PM

## 2023-10-29 ENCOUNTER — Other Ambulatory Visit: Payer: Self-pay | Admitting: Internal Medicine

## 2023-10-30 ENCOUNTER — Other Ambulatory Visit: Payer: Self-pay | Admitting: Internal Medicine

## 2023-10-30 DIAGNOSIS — I10 Essential (primary) hypertension: Secondary | ICD-10-CM

## 2023-11-07 ENCOUNTER — Ambulatory Visit: Payer: Medicare HMO | Admitting: Internal Medicine

## 2023-11-07 VITALS — BP 132/88 | HR 79 | Temp 97.5°F | Wt 132.0 lb

## 2023-11-07 DIAGNOSIS — I10 Essential (primary) hypertension: Secondary | ICD-10-CM

## 2023-11-07 DIAGNOSIS — M8580 Other specified disorders of bone density and structure, unspecified site: Secondary | ICD-10-CM

## 2023-11-07 DIAGNOSIS — I2584 Coronary atherosclerosis due to calcified coronary lesion: Secondary | ICD-10-CM | POA: Diagnosis not present

## 2023-11-07 DIAGNOSIS — I150 Renovascular hypertension: Secondary | ICD-10-CM

## 2023-11-07 DIAGNOSIS — I773 Arterial fibromuscular dysplasia: Secondary | ICD-10-CM

## 2023-11-07 DIAGNOSIS — Z780 Asymptomatic menopausal state: Secondary | ICD-10-CM

## 2023-11-07 DIAGNOSIS — Z1231 Encounter for screening mammogram for malignant neoplasm of breast: Secondary | ICD-10-CM

## 2023-11-07 DIAGNOSIS — I2510 Atherosclerotic heart disease of native coronary artery without angina pectoris: Secondary | ICD-10-CM | POA: Diagnosis not present

## 2023-11-07 LAB — CBC WITH DIFFERENTIAL/PLATELET
Basophils Absolute: 0 10*3/uL (ref 0.0–0.1)
Basophils Relative: 0.7 % (ref 0.0–3.0)
Eosinophils Absolute: 0.2 10*3/uL (ref 0.0–0.7)
Eosinophils Relative: 4.4 % (ref 0.0–5.0)
HCT: 43.5 % (ref 36.0–46.0)
Hemoglobin: 14.9 g/dL (ref 12.0–15.0)
Lymphocytes Relative: 30.7 % (ref 12.0–46.0)
Lymphs Abs: 1.6 10*3/uL (ref 0.7–4.0)
MCHC: 34.2 g/dL (ref 30.0–36.0)
MCV: 97.7 fl (ref 78.0–100.0)
Monocytes Absolute: 0.5 10*3/uL (ref 0.1–1.0)
Monocytes Relative: 9.2 % (ref 3.0–12.0)
Neutro Abs: 2.9 10*3/uL (ref 1.4–7.7)
Neutrophils Relative %: 55 % (ref 43.0–77.0)
Platelets: 261 10*3/uL (ref 150.0–400.0)
RBC: 4.45 Mil/uL (ref 3.87–5.11)
RDW: 12.5 % (ref 11.5–15.5)
WBC: 5.4 10*3/uL (ref 4.0–10.5)

## 2023-11-07 LAB — COMPREHENSIVE METABOLIC PANEL
ALT: 18 U/L (ref 0–35)
AST: 24 U/L (ref 0–37)
Albumin: 4.4 g/dL (ref 3.5–5.2)
Alkaline Phosphatase: 54 U/L (ref 39–117)
BUN: 14 mg/dL (ref 6–23)
CO2: 30 meq/L (ref 19–32)
Calcium: 9.5 mg/dL (ref 8.4–10.5)
Chloride: 101 meq/L (ref 96–112)
Creatinine, Ser: 0.63 mg/dL (ref 0.40–1.20)
GFR: 82.02 mL/min (ref >60.00)
Glucose, Bld: 92 mg/dL (ref 70–99)
Potassium: 4 meq/L (ref 3.5–5.1)
Sodium: 138 meq/L (ref 135–145)
Total Bilirubin: 0.5 mg/dL (ref 0.2–1.2)
Total Protein: 7.1 g/dL (ref 6.0–8.3)

## 2023-11-07 LAB — MICROALBUMIN / CREATININE URINE RATIO
Creatinine,U: 27.3 mg/dL
Microalb Creat Ratio: 25.6 mg/g (ref 0.0–30.0)
Microalb, Ur: <0.7 mg/dL (ref 0.0–1.9)

## 2023-11-07 LAB — IBC + FERRITIN
Ferritin: 15.4 ng/mL (ref 10.0–291.0)
Iron: 209 ug/dL — ABNORMAL HIGH (ref 42–145)
Saturation Ratios: 64.6 % — ABNORMAL HIGH (ref 20.0–50.0)
TIBC: 323.4 ug/dL (ref 250.0–450.0)
Transferrin: 231 mg/dL (ref 212.0–360.0)

## 2023-11-07 LAB — TSH: TSH: 1.3 u[IU]/mL (ref 0.35–5.50)

## 2023-11-07 MED ORDER — VALSARTAN 160 MG PO TABS
160.0000 mg | ORAL_TABLET | Freq: Two times a day (BID) | ORAL | 3 refills | Status: DC
Start: 2023-11-07 — End: 2024-02-03

## 2023-11-07 NOTE — Assessment & Plan Note (Addendum)
 currently on carvedilol 3..125 mg bid  Losartan 100 mg daily  And amlodipine 5 mg daily .  No recurrence of RAS based on Dec 2024 dopplers/  .changing losartan to valsartan 160 mg bid   Lab Results  Component Value Date   MICROALBUR <0.7 11/01/2022   MICROALBUR <0.7 04/11/2020     Lab Results  Component Value Date   NA 141 04/23/2023   K 4.1 04/23/2023   CL 104 04/23/2023   CO2 31 04/23/2023   Lab Results  Component Value Date   CREATININE 0.63 04/23/2023

## 2023-11-07 NOTE — Patient Instructions (Addendum)
 TdaP  vaccine is recommended; medicare will only pay for it at your pharmacy  I agree with NP Brion Aliment that changing your losartan to a higher potency ARB (valsartan) is worth trying And have sent the rx to your pharmacy   Treat yourself to a crepe breakfast at [facility] one of these days!

## 2023-11-07 NOTE — Progress Notes (Signed)
 Subjective:  Patient ID: Wendy Love, female    DOB: 03-01-1941  Age: 83 y.o. MRN: [id number]  CC: The primary encounter diagnosis was Renovascular hypertension. Diagnoses of Fibromuscular dysplasia of renal artery (HCC), White coat syndrome with diagnosis of hypertension, Coronary artery disease due to calcified coronary lesion, Autosomal dominant hereditary hemochromatosis (HCC), Osteopenia after menopause, Postmenopausal estrogen deficiency, and Breast cancer screening by mammogram were also pertinent to this visit.   HPI Jacinta W Judeen Hammans presents for  Chief Complaint  Patient presents with   Medical Management of Chronic Issues    1) Aortic and coronary atherosclerosis:  reviewed CAC done in Sept 2024:   CAC score was elevated at 63  (35% percentile) and she was advised to start zetia by cardiology but  she has again declined.  CT chest was otherwise normal.   2) White coat hypertension : home readings have ranged from 130 systolic to 180  3) attended the [facility] breakfast  at [facility] recently with 2 girlfriends.      Outpatient Medications Prior to Visit  Medication Sig Dispense Refill   amLODipine (NORVASC) 5 MG tablet TAKE 1 TABLET (5 MG TOTAL) BY MOUTH DAILY. 90 tablet 0   ASPIRIN LOW DOSE 81 MG tablet TAKE 1 TABLET BY MOUTH EVERY DAY 120 tablet 3   b complex vitamins tablet Take 1 tablet by mouth daily.     CALCIUM-MAGNESIUM PO Take by mouth.     carvedilol (COREG) 3.125 MG tablet TAKE 1 TABLET BY MOUTH TWICE A DAY WITH FOOD 180 tablet 3   Cholecalciferol (VITAMIN D3) 2000 UNITS TABS Take 5,000 Units by mouth daily.     Coenzyme Q10 (CO Q-10) 100 MG CAPS Take by mouth daily.     levothyroxine (SYNTHROID) 75 MCG tablet TAKE 1 TABLET BY MOUTH EVERY DAY 90 tablet 1   Magnesium 300 MG CAPS Take by mouth daily.     melatonin 1 MG TABS tablet Take 2 mg by mouth at bedtime.     Multiple Vitamin (MULTIVITAMIN) tablet Take 1 tablet by mouth daily.      Omega-3 Fatty Acids (FISH OIL PO) Take by mouth daily.     PROBIOTIC CAPS Take by mouth daily.     QUERCETIN PO Take 560 mg by mouth daily.     temazepam (RESTORIL) 15 MG capsule Take 1 capsule (15 mg total) by mouth at bedtime. 30 capsule 5   tretinoin (RETIN-A) 0.025 % cream APPLY TO WHOLE FACE EACH NIGHT.  5   Turmeric (QC TUMERIC COMPLEX PO) Take by mouth.     vitamin C (ASCORBIC ACID) 500 MG tablet Take 1,000 mg by mouth daily.      Zinc 50 MG TABS Take 50 mg by mouth daily.     losartan (COZAAR) 50 MG tablet Take 2 tablets (100 mg total) by mouth daily. 180 tablet 1   No facility-administered medications prior to visit.    Review of Systems;  Patient denies headache, fevers, malaise, unintentional weight loss, skin rash, eye pain, sinus congestion and sinus pain, sore throat, dysphagia,  hemoptysis , cough, dyspnea, wheezing, chest pain, palpitations, orthopnea, edema, abdominal pain, nausea, melena, diarrhea, constipation, flank pain, dysuria, hematuria, urinary  Frequency, nocturia, numbness, tingling, seizures,  Focal weakness, Loss of consciousness,  Tremor, insomnia, depression, anxiety, and suicidal ideation.      Objective:  BP 132/88   Pulse 79   Temp (!) 97.5 F (36.4 C)   Wt 132 lb (59.9 kg)  SpO2 97%   BMI 23.38 kg/m   BP Readings from Last 3 Encounters:  11/07/23 132/88  10/28/23 (!) 183/92  08/19/23 (!) 183/85    Wt Readings from Last 3 Encounters:  11/07/23 132 lb (59.9 kg)  10/28/23 131 lb 12.8 oz (59.8 kg)  08/19/23 129 lb 6.4 oz (58.7 kg)    Physical Exam  Lab Results  Component Value Date   HGBA1C 5.3 11/01/2022   HGBA1C 5.4 05/01/2022   HGBA1C 5.2 09/16/2019    Lab Results  Component Value Date   CREATININE 0.63 11/07/2023   CREATININE 0.63 04/23/2023   CREATININE 0.63 11/01/2022    Lab Results  Component Value Date   WBC 5.4 11/07/2023   HGB 14.9 11/07/2023   HCT 43.5 11/07/2023   PLT 261.0 11/07/2023   GLUCOSE 92 11/07/2023    CHOL 245 (H) 04/23/2023   TRIG 57.0 04/23/2023   HDL 90.30 04/23/2023   LDLDIRECT 135.0 11/01/2022   LDLCALC 144 (H) 04/23/2023   ALT 18 11/07/2023   AST 24 11/07/2023   NA 138 11/07/2023   K 4.0 11/07/2023   CL 101 11/07/2023   CREATININE 0.63 11/07/2023   BUN 14 11/07/2023   CO2 30 11/07/2023   TSH 1.30 11/07/2023   INR 1.0 04/11/2021   HGBA1C 5.3 11/01/2022   MICROALBUR <0.7 11/07/2023    CT CARDIAC SCORING (SELF PAY ONLY) Addendum Date: 05/28/2023 ADDENDUM REPORT: 05/28/2023 20:45 EXAM: OVER-READ INTERPRETATION  CT CHEST The following report is an over-read performed by radiologist Dr. Curly Shores [facility] on 05/28/2023. This over-read does not include interpretation of cardiac or coronary anatomy or pathology. The coronary calcium score interpretation by the cardiologist is attached. COMPARISON:  11/21/2005. FINDINGS: Cardiovascular: Atheromatous calcifications aorta and coronary arteries. See findings discussed in the body of the report. Mediastinum/Nodes: No suspicious adenopathy identified. Imaged mediastinal structures are unremarkable. Lungs/Pleura: Bibasilar linear subsegmental atelectasis or scarring. Imaged portions of the lungs are otherwise clear. No pleural effusion or pneumothorax. Upper Abdomen: No acute abnormality. Musculoskeletal: No chest wall abnormality. No acute or significant osseous findings. IMPRESSION: No significant extracardiac incidental findings identified. Electronically Signed   By: Layla Maw M.D.   On: 05/28/2023 20:45   Result Date: 05/28/2023 CLINICAL DATA:  Risk stratification EXAM: Coronary Calcium Score TECHNIQUE: The patient was scanned on a Siemens Somatom scanner. Axial non-contrast 3 mm slices were carried out through the heart. The data set was analyzed on a dedicated work station and scored using the Agatson method. FINDINGS: Non-cardiac: See separate report from [facility]. Ascending Aorta: Normal size  Pericardium: Normal Coronary arteries: Normal origin of left and right coronary arteries. Distribution of arterial calcifications if present, as noted below; LM 0 LAD 63.3 LCx 0 RCA 0 Total 63.3 IMPRESSION AND RECOMMENDATION: 1. Coronary calcium score of 63.3. This was 36th percentile for age and sex matched control. 2. CAC 1-99 in LAD. CAC-DRS A1/N1. 3. Continue heart healthy lifestyle and risk factor modification. Electronically Signed: By: Debbe Odea M.D. On: 05/16/2023 18:18    Assessment & Plan:  .Renovascular hypertension -     Comprehensive metabolic panel -     Microalbumin / creatinine urine ratio  Fibromuscular dysplasia of renal artery (HCC)  White coat syndrome with diagnosis of hypertension Assessment & Plan: currently on carvedilol 3..125 mg bid  Losartan 100 mg daily  And amlodipine 5 mg daily .  No recurrence of RAS based on Dec 2024 dopplers/  .changing losartan to valsartan 160 mg bid  Lab Results  Component Value Date   MICROALBUR <0.7 11/01/2022   MICROALBUR <0.7 04/11/2020     Lab Results  Component Value Date   NA 141 04/23/2023   K 4.1 04/23/2023   CL 104 04/23/2023   CO2 31 04/23/2023   Lab Results  Component Value Date   CREATININE 0.63 04/23/2023      Coronary artery disease due to calcified coronary lesion Assessment & Plan: She continues to decline statin therapy    Autosomal dominant hereditary hemochromatosis [facility]) Assessment & Plan: She deferred hematology referral in April  2019 .  She  is managing with periodic blood donation   Lab Results  Component Value Date   IRON 209 (H) 11/07/2023   TIBC 323.4 11/07/2023   FERRITIN 15.4 11/07/2023   Lab Results  Component Value Date   WBC 5.4 11/07/2023   HGB 14.9 11/07/2023   HCT 43.5 11/07/2023   MCV 97.7 11/07/2023   PLT 261.0 11/07/2023   Lab Results  Component Value Date   ALT 18 11/07/2023   AST 24 11/07/2023   ALKPHOS 54 11/07/2023   BILITOT 0.5 11/07/2023   Lab  Results  Component Value Date   HGBA1C 5.3 11/01/2022     Orders: -     IBC + Ferritin -     CBC with Differential/Platelet  Osteopenia after menopause -     TSH  Postmenopausal estrogen deficiency -     DG Bone Density; Future  Breast cancer screening by mammogram -     3D Screening Mammogram, Left and Right; Future  Other orders -     Valsartan; Take 1 tablet (160 mg total) by mouth 2 (two) times daily.  Dispense: 60 tablet; Refill: 3     I spent 34 minutes on the day of this face to face encounter reviewing patient's  most recent visit with cardiology,   prior relevant surgical and non surgical procedures, recent  labs and imaging studies, reviewing the assessment and plan with patient, and post visit ordering and reviewing of  diagnostics and therapeutics with patient  .   Follow-up: Return in about 6 months (around 05/06/2024) for hypertension, physical.   Sherlene Shams, MD

## 2023-11-09 ENCOUNTER — Encounter: Payer: Self-pay | Admitting: Internal Medicine

## 2023-11-09 NOTE — Assessment & Plan Note (Signed)
 She continues to decline statin therapy

## 2023-11-09 NOTE — Assessment & Plan Note (Signed)
 She deferred hematology referral in April  2019 .  She  is managing with periodic blood donation   Lab Results  Component Value Date   IRON 209 (H) 11/07/2023   TIBC 323.4 11/07/2023   FERRITIN 15.4 11/07/2023   Lab Results  Component Value Date   WBC 5.4 11/07/2023   HGB 14.9 11/07/2023   HCT 43.5 11/07/2023   MCV 97.7 11/07/2023   PLT 261.0 11/07/2023   Lab Results  Component Value Date   ALT 18 11/07/2023   AST 24 11/07/2023   ALKPHOS 54 11/07/2023   BILITOT 0.5 11/07/2023   Lab Results  Component Value Date   HGBA1C 5.3 11/01/2022

## 2023-12-01 ENCOUNTER — Encounter: Payer: Self-pay | Admitting: Internal Medicine

## 2023-12-01 ENCOUNTER — Telehealth (INDEPENDENT_AMBULATORY_CARE_PROVIDER_SITE_OTHER): Admitting: Internal Medicine

## 2023-12-01 VITALS — BP 155/88 | HR 68 | Ht 63.0 in | Wt 132.0 lb

## 2023-12-01 DIAGNOSIS — I773 Arterial fibromuscular dysplasia: Secondary | ICD-10-CM

## 2023-12-01 DIAGNOSIS — I150 Renovascular hypertension: Secondary | ICD-10-CM

## 2023-12-01 DIAGNOSIS — I10 Essential (primary) hypertension: Secondary | ICD-10-CM | POA: Diagnosis not present

## 2023-12-01 DIAGNOSIS — E039 Hypothyroidism, unspecified: Secondary | ICD-10-CM

## 2023-12-01 NOTE — Patient Instructions (Addendum)
 Take 10 mg of amlodipine now  Starting tomorrow take 320 of valsartan in the morning Continue current dose of carvedilol   On Wednesday try the 1/2 alprazolam  in the morning  as long as readings are > 140/80

## 2023-12-01 NOTE — Assessment & Plan Note (Signed)
 She has been unable to donate blood due to extreme elevations in BP during blood donation checks.  Recommend trial of alprazolam, to first be tried at home wit 0.125 mg

## 2023-12-01 NOTE — Progress Notes (Unsigned)
 Virtual Visit via Caregility   Note   This format is felt to be most appropriate for this patient at this time.  All issues noted in this document were discussed and addressed.  No physical exam was performed (except for noted visual exam findings with Video Visits).   I connected with Wendy Love on 12/01/23 at  5:00 PM EDT by a video enabled telemedicine application or telephone and verified that I am speaking with the correct person using two identifiers. Location patient: home Location provider: work or home office Persons participating in the virtual visit: patient, provider  I discussed the limitations, risks, security and privacy concerns of performing an evaluation and management service by telephone and the availability of in person appointments. I also discussed with the patient that there may be a patient responsible charge related to this service. The patient expressed understanding and agreed to proceed.   Reason for visit: elevated blood pressure readings   HPI:  83 yr old female with HTN , histor yof RAS s/p intervention on the right presents with elevation in readings .    ROS: See pertinent positives and negatives per HPI.  Past Medical History:  Diagnosis Date   Anxiety    Bronchitis    Cancer (HCC)    squamous cell   Claudication (HCC)    a. 11/2021 ABIs: R 1.09, L 1.01.   Coronary artery calcification seen on CT scan    a. 11/2005 Cor CTA: Nl cors. Ca2+ = 0.  EF 56%; b. 05/2023 Cardiac CT: Ca2+ = 63.3 (36th%'ile).   COVID-19 09/01/21   home test    Degenerative disc disease, lumbar    Depression    Diastolic dysfunction    a. 02/2022 Echo: EF 55-60%, no rwma, mild LVH, GrI DD, nl RV size/fxn, RVSP 19.11mmHg, Ao sclerosis w/o stenosis.   Endometriosis 1974   s/p abdominal surgery, 2nd surgery   Fibromuscular dysplasia (HCC)    Frequent epistaxis 04/13/2021   High cholesterol    Hypertension    Hypothyroidism    Rhinosinusitis    Right renal artery stenosis (HCC)     a. 10/2021 s/p PTA/DCBA R renal artery; b. 11/2021 Renal duplex: 1-59% bilat RA stenoses; c. 02/2023 U/S: No significant RA stenosis.   Sciatica     Past Surgical History:  Procedure Laterality Date   ABDOMINAL HYSTERECTOMY  09/09/1998   APPENDECTOMY     CARPAL TUNNEL RELEASE     bilateral   CATARACT EXTRACTION W/PHACO Left 07/26/2020   Procedure: CATARACT EXTRACTION PHACO AND INTRAOCULAR LENS PLACEMENT (IOC) LEFT 6.30 01:10.2 9.0%;  Surgeon: Lockie Mola, MD;  Location: [facility];  Service: Ophthalmology;  Laterality: Left;   CATARACT EXTRACTION W/PHACO Right 10/18/2020   Procedure: CATARACT EXTRACTION PHACO AND INTRAOCULAR LENS PLACEMENT (IOC) RIGHT 9.18 01:14.6 12.3%;  Surgeon: Lockie Mola, MD;  Location: [facility];  Service: Ophthalmology;  Laterality: Right;   dental implant  2023   Endoscopy/colonoscopy  12/09/2010   Schatski's Ring , small hiatal hernia   hemilaminectomy  12/08/2008   L3-L4hemi, L4-L5 microdiskectomy   KNEE ARTHROSCOPY WITH LATERAL MENISECTOMY Right 04/08/2018   Procedure: KNEE ARTHROSCOPY WITH PARTIAL LATERAL MENISECTOMY and debridement.;  Surgeon: Christena Flake, MD;  Location: [facility];  Service: Orthopedics;  Laterality: Right;   REFRACTIVE SURGERY Right 06/2022   RENAL ANGIOGRAPHY N/A 10/25/2021   Procedure: RENAL ANGIOGRAPHY;  Surgeon: Annice Needy, MD;  Location: [facility];  Service: Cardiovascular;  Laterality: N/A;  Family History  Problem Relation Age of Onset   Ovarian cancer Mother    Cancer Mother 32       ovarian cancer, treated by [facility]   Heart disease Father    Cancer Sister 11       BREAST   Breast cancer Sister     SOCIAL HX: ***   Current Outpatient Medications:    amLODipine (NORVASC) 5 MG tablet, TAKE 1 TABLET (5 MG TOTAL) BY MOUTH DAILY., Disp: 90 tablet, Rfl: 0   ASPIRIN LOW DOSE 81 MG tablet, TAKE 1 TABLET BY MOUTH EVERY DAY, Disp: 120 tablet, Rfl: 3   b complex  vitamins tablet, Take 1 tablet by mouth daily., Disp: , Rfl:    CALCIUM-MAGNESIUM PO, Take by mouth., Disp: , Rfl:    carvedilol (COREG) 3.125 MG tablet, TAKE 1 TABLET BY MOUTH TWICE A DAY WITH FOOD, Disp: 180 tablet, Rfl: 3   Cholecalciferol (VITAMIN D3) 2000 UNITS TABS, Take 5,000 Units by mouth daily., Disp: , Rfl:    Coenzyme Q10 (CO Q-10) 100 MG CAPS, Take by mouth daily., Disp: , Rfl:    levothyroxine (SYNTHROID) 75 MCG tablet, TAKE 1 TABLET BY MOUTH EVERY DAY, Disp: 90 tablet, Rfl: 1   Magnesium 300 MG CAPS, Take by mouth daily., Disp: , Rfl:    melatonin 1 MG TABS tablet, Take 2 mg by mouth at bedtime., Disp: , Rfl:    Multiple Vitamin (MULTIVITAMIN) tablet, Take 1 tablet by mouth daily., Disp: , Rfl:    Omega-3 Fatty Acids (FISH OIL PO), Take by mouth daily., Disp: , Rfl:    PROBIOTIC CAPS, Take by mouth daily., Disp: , Rfl:    QUERCETIN PO, Take 560 mg by mouth daily., Disp: , Rfl:    temazepam (RESTORIL) 15 MG capsule, Take 1 capsule (15 mg total) by mouth at bedtime., Disp: 30 capsule, Rfl: 5   tretinoin (RETIN-A) 0.025 % cream, APPLY TO WHOLE FACE EACH NIGHT., Disp: , Rfl: 5   Turmeric (QC TUMERIC COMPLEX PO), Take by mouth., Disp: , Rfl:    valsartan (DIOVAN) 160 MG tablet, Take 1 tablet (160 mg total) by mouth 2 (two) times daily., Disp: 60 tablet, Rfl: 3   vitamin C (ASCORBIC ACID) 500 MG tablet, Take 1,000 mg by mouth daily. , Disp: , Rfl:    Zinc 50 MG TABS, Take 50 mg by mouth daily., Disp: , Rfl:   EXAM:  VITALS per patient if applicable:  GENERAL: alert, oriented, appears well and in no acute distress  HEENT: atraumatic, conjunttiva clear, no obvious abnormalities on inspection of external nose and ears  NECK: normal movements of the head and neck  LUNGS: on inspection no signs of respiratory distress, breathing rate appears normal, no obvious gross SOB, gasping or wheezing  CV: no obvious cyanosis  MS: moves all visible extremities without noticeable  abnormality  PSYCH/NEURO: pleasant and cooperative, no obvious depression or anxiety, speech and thought processing grossly intact  ASSESSMENT AND PLAN: Renovascular hypertension Assessment & Plan: Recent readings since switching from losartan to valsartan (taking 160 mg bid ) have  been > 150/80 and she has had a low grade bitemporal headache .  She was advised to increase amlodipine dose to 10 mg daily in the afternoon and consolidate the valsartan to 320 mg  in the morning.  Carvedilos dose remains 3.125 bid.      White coat syndrome with diagnosis of hypertension Assessment & Plan: She has been unable to donate blood due  to extreme elevations in BP during blood donation checks.  Recommend trial of alprazolam, to first be tried at home wit 0.125 mg        I discussed the assessment and treatment plan with the patient. The patient was provided an opportunity to ask questions and all were answered. The patient agreed with the plan and demonstrated an understanding of the instructions.   The patient was advised to call back or seek an in-person evaluation if the symptoms worsen or if the condition fails to improve as anticipated.   I spent 30 minutes dedicated to the care of this patient on the date of this encounter to include pre-visit review of his medical history,  Face-to-face time with the patient , and post visit ordering of testing and therapeutics.    Sherlene Shams, MD

## 2023-12-01 NOTE — Assessment & Plan Note (Signed)
 Recent readings since switching from losartan to valsartan (taking 160 mg bid ) have  been > 150/80 and she has had a low grade bitemporal headache .  She was advised to increase amlodipine dose to 10 mg daily in the afternoon and consolidate the valsartan to 320 mg  in the morning.  Carvedilos dose remains 3.125 bid.

## 2023-12-02 NOTE — Assessment & Plan Note (Signed)
 Thyroid function is WNL on current dose. Of 75 mcg daily levothyroxine.  No current changes needed.   Lab Results  Component Value Date   TSH 1.30 11/07/2023

## 2023-12-02 NOTE — Assessment & Plan Note (Addendum)
 Hgb has been recently checked and still < 15,  but Her recent attempt  to donate blood was deferred because of systolic BP > 180.  Advised to try again after a trial of alprazolam to manage her white coat hypertension .  Will repeat CBC and iron in 3 months and if  If she remains unable to donate she will be referred to hematology for therapeutic phlebotomy   Lab Results  Component Value Date   WBC 5.4 11/07/2023   HGB 14.9 11/07/2023   HCT 43.5 11/07/2023   MCV 97.7 11/07/2023   PLT 261.0 11/07/2023   .lastiron

## 2023-12-02 NOTE — Assessment & Plan Note (Addendum)
 Dec 2024  duplex showed no elevated velocities in either renal artery indicating patency of the angioplasty and no hemodynamically significant stenosis in the left renal artery.

## 2023-12-09 ENCOUNTER — Telehealth: Payer: Self-pay | Admitting: Internal Medicine

## 2023-12-09 ENCOUNTER — Encounter: Payer: Self-pay | Admitting: Internal Medicine

## 2023-12-09 NOTE — Telephone Encounter (Signed)
 Lm to call office and reschedule 05/07/2024 appt with Dr Darrick Huntsman.  E2C2 please schedule

## 2023-12-19 DIAGNOSIS — D485 Neoplasm of uncertain behavior of skin: Secondary | ICD-10-CM | POA: Diagnosis not present

## 2023-12-19 DIAGNOSIS — C44722 Squamous cell carcinoma of skin of right lower limb, including hip: Secondary | ICD-10-CM | POA: Diagnosis not present

## 2024-01-16 DIAGNOSIS — C44722 Squamous cell carcinoma of skin of right lower limb, including hip: Secondary | ICD-10-CM | POA: Diagnosis not present

## 2024-01-24 ENCOUNTER — Other Ambulatory Visit: Payer: Self-pay | Admitting: Internal Medicine

## 2024-01-24 DIAGNOSIS — I10 Essential (primary) hypertension: Secondary | ICD-10-CM

## 2024-01-27 ENCOUNTER — Encounter (INDEPENDENT_AMBULATORY_CARE_PROVIDER_SITE_OTHER): Payer: Self-pay

## 2024-02-01 ENCOUNTER — Other Ambulatory Visit: Payer: Self-pay | Admitting: Internal Medicine

## 2024-02-01 DIAGNOSIS — I10 Essential (primary) hypertension: Secondary | ICD-10-CM

## 2024-02-04 DIAGNOSIS — Z85828 Personal history of other malignant neoplasm of skin: Secondary | ICD-10-CM | POA: Diagnosis not present

## 2024-02-04 DIAGNOSIS — Z08 Encounter for follow-up examination after completed treatment for malignant neoplasm: Secondary | ICD-10-CM | POA: Diagnosis not present

## 2024-02-04 DIAGNOSIS — S30860A Insect bite (nonvenomous) of lower back and pelvis, initial encounter: Secondary | ICD-10-CM | POA: Diagnosis not present

## 2024-02-04 DIAGNOSIS — D1801 Hemangioma of skin and subcutaneous tissue: Secondary | ICD-10-CM | POA: Diagnosis not present

## 2024-02-04 DIAGNOSIS — L821 Other seborrheic keratosis: Secondary | ICD-10-CM | POA: Diagnosis not present

## 2024-02-09 DIAGNOSIS — Z961 Presence of intraocular lens: Secondary | ICD-10-CM | POA: Diagnosis not present

## 2024-02-09 DIAGNOSIS — H40003 Preglaucoma, unspecified, bilateral: Secondary | ICD-10-CM | POA: Diagnosis not present

## 2024-02-09 DIAGNOSIS — H04123 Dry eye syndrome of bilateral lacrimal glands: Secondary | ICD-10-CM | POA: Diagnosis not present

## 2024-02-17 ENCOUNTER — Ambulatory Visit (INDEPENDENT_AMBULATORY_CARE_PROVIDER_SITE_OTHER): Payer: Medicare HMO

## 2024-02-17 ENCOUNTER — Ambulatory Visit (INDEPENDENT_AMBULATORY_CARE_PROVIDER_SITE_OTHER): Payer: Medicare HMO | Admitting: Vascular Surgery

## 2024-02-17 ENCOUNTER — Encounter (INDEPENDENT_AMBULATORY_CARE_PROVIDER_SITE_OTHER): Payer: Self-pay | Admitting: Vascular Surgery

## 2024-02-17 VITALS — BP 171/90 | HR 78 | Resp 18 | Ht 63.0 in | Wt 128.6 lb

## 2024-02-17 DIAGNOSIS — I773 Arterial fibromuscular dysplasia: Secondary | ICD-10-CM

## 2024-02-17 DIAGNOSIS — I150 Renovascular hypertension: Secondary | ICD-10-CM

## 2024-02-17 DIAGNOSIS — E7800 Pure hypercholesterolemia, unspecified: Secondary | ICD-10-CM

## 2024-02-17 DIAGNOSIS — I6521 Occlusion and stenosis of right carotid artery: Secondary | ICD-10-CM | POA: Diagnosis not present

## 2024-02-17 NOTE — Assessment & Plan Note (Signed)
 Minimal disease seen couple of years ago.  Plan to recheck at her next visit with duplex.

## 2024-02-17 NOTE — Assessment & Plan Note (Signed)
 Her renal artery duplex today shows a widely patent right renal artery angioplasty without recurrent stenosis and no hemodynamically significant stenosis in the left renal artery.  Although her blood pressure has worsened some since her last visit, with this renal artery duplex finding I do not see much benefit for angiography or further evaluation at this point.  Recheck in 6 months.

## 2024-02-17 NOTE — Progress Notes (Signed)
 MRN : [id number]  Wendy Love is a 83 y.o. ([birth year]) female who presents with chief complaint of  Chief Complaint  Patient presents with   Follow-up    Follow Up 6 month Renal  .  History of Present Illness: Patient returns today in follow up of her fibromuscular dysplasia.  She is 2 years status post angioplasty of the right renal artery for fibromuscular dysplasia creating stenosis with renovascular hypertension present.  The patient reports her blood pressure control has been suboptimal and her primary care provider has had increased her blood pressure regimen since her last visit.  Her renal function remains normal as far she knows.  No focal neurologic symptoms.  Her renal artery duplex today shows a widely patent right renal artery angioplasty without recurrent stenosis and no hemodynamically significant stenosis in the left renal artery.  Current Outpatient Medications  Medication Sig Dispense Refill   amLODipine  (NORVASC ) 5 MG tablet TAKE 1 TABLET (5 MG TOTAL) BY MOUTH DAILY. 90 tablet 0   ASPIRIN  LOW DOSE 81 MG tablet TAKE 1 TABLET BY MOUTH EVERY DAY 120 tablet 3   b complex vitamins tablet Take 1 tablet by mouth daily.     CALCIUM -MAGNESIUM PO Take by mouth.     carvedilol  (COREG ) 3.125 MG tablet TAKE 1 TABLET BY MOUTH TWICE A DAY WITH FOOD 180 tablet 3   Cholecalciferol (VITAMIN D3) 2000 UNITS TABS Take 5,000 Units by mouth daily.     Coenzyme Q10 (CO Q-10) 100 MG CAPS Take by mouth daily.     levothyroxine  (SYNTHROID ) 75 MCG tablet TAKE 1 TABLET BY MOUTH EVERY DAY 90 tablet 1   Magnesium 300 MG CAPS Take by mouth daily.     melatonin 1 MG TABS tablet Take 2 mg by mouth at bedtime.     Multiple Vitamin (MULTIVITAMIN) tablet Take 1 tablet by mouth daily.     Omega-3 Fatty Acids (FISH OIL PO) Take by mouth daily.     PROBIOTIC CAPS Take by mouth daily.     QUERCETIN PO Take 560 mg by mouth daily.     temazepam  (RESTORIL ) 15 MG capsule Take 1 capsule (15 mg total)  by mouth at bedtime. 30 capsule 5   tretinoin (RETIN-A) 0.025 % cream APPLY TO WHOLE FACE EACH NIGHT.  5   Turmeric (QC TUMERIC COMPLEX PO) Take by mouth.     valsartan  (DIOVAN ) 160 MG tablet TAKE 1 TABLET BY MOUTH 2 TIMES DAILY. 180 tablet 1   vitamin C (ASCORBIC ACID) 500 MG tablet Take 1,000 mg by mouth daily.      Zinc 50 MG TABS Take 50 mg by mouth daily.     No current facility-administered medications for this visit.    Past Medical History:  Diagnosis Date   Anxiety    Bronchitis    Cancer (HCC)    squamous cell   Claudication (HCC)    a. 11/2021 ABIs: R 1.09, L 1.01.   Coronary artery calcification seen on CT scan    a. 11/2005 Cor CTA: Nl cors. Ca2+ = 0.  EF 56%; b. 05/2023 Cardiac CT: Ca2+ = 63.3 (36th%'ile).   COVID-19 09/01/21   home test    Degenerative disc disease, lumbar    Depression    Diastolic dysfunction    a. 02/2022 Echo: EF 55-60%, no rwma, mild LVH, GrI DD, nl RV size/fxn, RVSP 19.62mmHg, Ao sclerosis w/o stenosis.   Endometriosis 1974   s/p abdominal surgery, 2nd surgery  Fibromuscular dysplasia (HCC)    Frequent epistaxis 04/13/2021   High cholesterol    Hypertension    Hypothyroidism    Rhinosinusitis    Right renal artery stenosis (HCC)    a. 10/2021 s/p PTA/DCBA R renal artery; b. 11/2021 Renal duplex: 1-59% bilat RA stenoses; c. 02/2023 U/S: No significant RA stenosis.   Sciatica     Past Surgical History:  Procedure Laterality Date   ABDOMINAL HYSTERECTOMY  09/09/1998   APPENDECTOMY     CARPAL TUNNEL RELEASE     bilateral   CATARACT EXTRACTION W/PHACO Left 07/26/2020   Procedure: CATARACT EXTRACTION PHACO AND INTRAOCULAR LENS PLACEMENT (IOC) LEFT 6.30 01:10.2 9.0%;  Surgeon: Annell Kidney, MD;  Location: [facility];  Service: Ophthalmology;  Laterality: Left;   CATARACT EXTRACTION W/PHACO Right 10/18/2020   Procedure: CATARACT EXTRACTION PHACO AND INTRAOCULAR LENS PLACEMENT (IOC) RIGHT 9.18 01:14.6 12.3%;  Surgeon: Annell Kidney, MD;  Location: [facility];  Service: Ophthalmology;  Laterality: Right;   dental implant  2023   Endoscopy/colonoscopy  12/09/2010   Schatski's Ring , small hiatal hernia   hemilaminectomy  12/08/2008   L3-L4hemi, L4-L5 microdiskectomy   KNEE ARTHROSCOPY WITH LATERAL MENISECTOMY Right 04/08/2018   Procedure: KNEE ARTHROSCOPY WITH PARTIAL LATERAL MENISECTOMY and debridement.;  Surgeon: Elner Hahn, MD;  Location: [facility];  Service: Orthopedics;  Laterality: Right;   REFRACTIVE SURGERY Right 06/2022   RENAL ANGIOGRAPHY N/A 10/25/2021   Procedure: RENAL ANGIOGRAPHY;  Surgeon: Celso College, MD;  Location: [facility];  Service: Cardiovascular;  Laterality: N/A;     Social History   Tobacco Use   Smoking status: Never   Smokeless tobacco: Never   Tobacco comments:    smoked socially in college  Vaping Use   Vaping status: Never Used  Substance Use Topics   Alcohol use: No   Drug use: No      Family History  Problem Relation Age of Onset   Ovarian cancer Mother    Cancer Mother 56       ovarian cancer, treated by [facility]   Heart disease Father    Cancer Sister 25       BREAST   Breast cancer Sister      Allergies  Allergen Reactions   Atorvastatin  Other (See Comments)    myalgias   Brompheniramine-Pseudoeph     REACTION: agitation, nervous, sleepless   Codeine     REACTION: GI Upset   Diphenhydramine  Hcl     REACTION: nervous, sleepless, agitated   Hctz [Hydrochlorothiazide ] Other (See Comments)    Severe hyponatremia   Loratadine     REACTION: nervous, sleeplessness   Other Other (See Comments)    Tape left "burn-type" marks on face after Cataract surgery   Tape     Tape left "burn-type" marks on face after Cataract surgery   Triamterene      Hyperkalemia and hyponatremia   Tramadol  Hcl Rash    hyperactivity     REVIEW OF SYSTEMS (Negative unless checked)  Constitutional: [] Weight loss  [] Fever  [] Chills Cardiac:  [] Chest pain   [] Chest pressure   [] Palpitations   [] Shortness of breath when laying flat   [] Shortness of breath at rest   [] Shortness of breath with exertion. Vascular:  [] Pain in legs with walking   [] Pain in legs at rest   [] Pain in legs when laying flat   [] Claudication   [] Pain in feet when walking  [] Pain in feet at rest  []   Pain in feet when laying flat   [] History of DVT   [] Phlebitis   [] Swelling in legs   [] Varicose veins   [] Non-healing ulcers Pulmonary:   [] Uses home oxygen   [] Productive cough   [] Hemoptysis   [] Wheeze  [] COPD   [] Asthma Neurologic:  [] Dizziness  [] Blackouts   [] Seizures   [] History of stroke   [] History of TIA  [] Aphasia   [] Temporary blindness   [] Dysphagia   [] Weakness or numbness in arms   [] Weakness or numbness in legs Musculoskeletal:  [x] Arthritis   [] Joint swelling   [] Joint pain   [x] Low back pain Hematologic:  [] Easy bruising  [] Easy bleeding   [] Hypercoagulable state   [] Anemic   Gastrointestinal:  [] Blood in stool   [] Vomiting blood  [] Gastroesophageal reflux/heartburn   [] Abdominal pain Genitourinary:  [] Chronic kidney disease   [] Difficult urination  [] Frequent urination  [] Burning with urination   [] Hematuria Skin:  [] Rashes   [] Ulcers   [] Wounds Psychological:  [x] History of anxiety   []  History of major depression.  Physical Examination  BP (!) 171/90 (BP Location: Left Arm, Patient Position: Sitting, Cuff Size: Normal)   Pulse 78   Resp 18   Ht 5\' 3"  (1.6 m)   Wt 128 lb 9.6 oz (58.3 kg)   BMI 22.78 kg/m  Gen:  WD/WN, NAD. Appears younger than stated age. Head: [place]/AT, No temporalis wasting. Ear/Nose/Throat: Hearing grossly intact, nares w/o erythema or drainage Eyes: Conjunctiva clear. Sclera non-icteric Neck: Supple.  Trachea midline Pulmonary:  Good air movement, no use of accessory muscles.  Cardiac: RRR, no JVD Vascular:  Vessel Right Left  Radial Palpable Palpable               Musculoskeletal: M/S 5/5 throughout.  No deformity or  atrophy. No edema. Neurologic: Sensation grossly intact in extremities.  Symmetrical.  Speech is fluent.  Psychiatric: Judgment intact, Mood & affect appropriate for pt's clinical situation. Dermatologic: No rashes or ulcers noted.  No cellulitis or open wounds.      Labs No results found for this or any previous visit (from the past 2160 hours).  Radiology No results found.  Assessment/Plan  Carotid stenosis Minimal disease seen couple of years ago.  Plan to recheck at her next visit with duplex.  Fibromuscular dysplasia of renal artery (HCC) Her renal artery duplex today shows a widely patent right renal artery angioplasty without recurrent stenosis and no hemodynamically significant stenosis in the left renal artery.  Although her blood pressure has worsened some since her last visit, with this renal artery duplex finding I do not see much benefit for angiography or further evaluation at this point.  Recheck in 6 months.  Renovascular hypertension Blood pressure control is improved after renal artery intervention on the right last year, but has worsened over time.  With a normal renal artery duplex, I do not see significant reason to perform renal angiography at this time.  I will defer to her primary care physician on the management of her BP meds.   Hyperlipidemia lipid control important in reducing the progression of atherosclerotic disease. Continue statin therapy  Mikki Alexander, MD  02/17/2024 9:24 AM    This note was created with Dragon medical transcription system.  Any errors from dictation are purely unintentional

## 2024-02-26 ENCOUNTER — Telehealth: Payer: Self-pay

## 2024-02-26 DIAGNOSIS — Z1231 Encounter for screening mammogram for malignant neoplasm of breast: Secondary | ICD-10-CM

## 2024-02-26 NOTE — Telephone Encounter (Signed)
 Left message to call the office back regarding if the Patient is interested in a Mammogram and if so where would she like to get it done so we can order it.

## 2024-03-02 NOTE — Telephone Encounter (Unsigned)
 Copied from CRM [id number]. Topic: Clinical - Request for Lab/Test Order >> Mar 02, 2024  4:29 PM Elle L wrote: Reason for CRM: The patient was returning a call from Sebastian Norris A, CMA to advise that she is interested in a mammogram and she would like to have it done at [facility] at [facility]. The patient's call back number is [phone] if needed.

## 2024-03-02 NOTE — Addendum Note (Signed)
 Addended by: Elishah Ashmore on: 03/02/2024 05:35 PM   Modules accepted: Orders

## 2024-03-02 NOTE — Telephone Encounter (Signed)
 Pt is aware that mammogram order has been placed and was given the number to call and schedule.

## 2024-03-13 ENCOUNTER — Other Ambulatory Visit: Payer: Self-pay | Admitting: Internal Medicine

## 2024-03-15 NOTE — Telephone Encounter (Signed)
 Name of Medication: Temazepam  (restoril ) 15mg  Name of Pharmacy: [facility] [place] Last [place] or Written Date and Quantity: 05/02/2023 30cap 5refills Last Office Visit and Type: 11/07/2023 for Hypertension Next Office Visit and Type: 03/30/24 for Annual Last Controlled Substance Agreement Date: none Last UDS: none

## 2024-03-23 ENCOUNTER — Ambulatory Visit
Admission: RE | Admit: 2024-03-23 | Discharge: 2024-03-23 | Disposition: A | Discharge status: Home / Self Care | Source: Ambulatory Visit | Attending: Internal Medicine | Admitting: Internal Medicine

## 2024-03-23 DIAGNOSIS — Z1231 Encounter for screening mammogram for malignant neoplasm of breast: Secondary | ICD-10-CM | POA: Diagnosis not present

## 2024-03-30 ENCOUNTER — Ambulatory Visit (INDEPENDENT_AMBULATORY_CARE_PROVIDER_SITE_OTHER): Payer: Medicare HMO | Admitting: *Deleted

## 2024-03-30 VITALS — Ht 63.0 in | Wt 132.0 lb

## 2024-03-30 DIAGNOSIS — Z0000 Encounter for general adult medical examination without abnormal findings: Secondary | ICD-10-CM | POA: Diagnosis not present

## 2024-03-30 NOTE — Progress Notes (Signed)
 Subjective:   Wendy Love is a 83 y.o. who presents for a Medicare Wellness preventive visit.  As a reminder, Annual Wellness Visits don't include a physical exam, and some assessments may be limited, especially if this visit is performed virtually. We may recommend an in-person follow-up visit with your provider if needed.  Visit Complete: Virtual I connected with  Ignacia LELON Victorine on 03/30/24 by a audio enabled telemedicine application and verified that I am speaking with the correct person using two identifiers.  Patient Location: Home  Provider Location: Home Office  I discussed the limitations of evaluation and management by telemedicine. The patient expressed understanding and agreed to proceed.  Vital Signs: Because this visit was a virtual/telehealth visit, some criteria may be missing or patient reported. Any vitals not documented were not able to be obtained and vitals that have been documented are patient reported.  VideoDeclined- This patient declined Librarian, academic. Therefore the visit was completed with audio only.  Persons Participating in Visit: Patient.  AWV Questionnaire: No: Patient Medicare AWV questionnaire was not completed prior to this visit.  Cardiac Risk Factors include: advanced age (>39men, >26 women);dyslipidemia;hypertension;Other (see comment), Risk factor comments: CAD     Objective:    Today's Vitals   03/30/24 1337  Weight: 132 lb (59.9 kg)  Height: 5' 3 (1.6 m)   Body mass index is 23.38 kg/m.     03/30/2024    1:53 PM 03/25/2023    3:28 PM 02/22/2022    8:30 AM 02/20/2021    3:39 PM 10/18/2020    8:40 AM 07/26/2020   11:44 AM 02/18/2020    1:24 PM  Advanced Directives  Does Patient Have a Medical Advance Directive? Yes Yes Yes Yes Yes Yes Yes  Type of Estate agent of [place];Living will Healthcare Power of [place];Living will Healthcare Power of [place];Living will  Healthcare Power of [place];Living will Healthcare Power of [place];Living will Living will;Healthcare Power of [employer] Power of [place];Living will  Does patient want to make changes to medical advance directive?   No - Patient declined No - Patient declined No - Patient declined  No - Patient declined  Copy of Healthcare Power of Attorney in Chart? No - copy requested No - copy requested No - copy requested No - copy requested No - copy requested No - copy requested No - copy requested    Current Medications (verified) Outpatient Encounter Medications as of 03/30/2024  Medication Sig   amLODipine  (NORVASC ) 5 MG tablet TAKE 1 TABLET (5 MG TOTAL) BY MOUTH DAILY. (Patient taking differently: Take 5 mg by mouth daily. Take two daily)   ASPIRIN  LOW DOSE 81 MG tablet TAKE 1 TABLET BY MOUTH EVERY DAY   b complex vitamins tablet Take 1 tablet by mouth daily.   carvedilol  (COREG ) 3.125 MG tablet TAKE 1 TABLET BY MOUTH TWICE A DAY WITH FOOD   Cholecalciferol (VITAMIN D3) 2000 UNITS TABS Take 5,000 Units by mouth daily.   Coenzyme Q10 (CO Q-10) 100 MG CAPS Take by mouth daily.   levothyroxine  (SYNTHROID ) 75 MCG tablet TAKE 1 TABLET BY MOUTH EVERY DAY   Magnesium 300 MG CAPS Take by mouth daily.   melatonin 1 MG TABS tablet Take 2 mg by mouth at bedtime.   Multiple Vitamin (MULTIVITAMIN) tablet Take 1 tablet by mouth daily.   Omega-3 Fatty Acids (FISH OIL PO) Take by mouth daily.   PROBIOTIC CAPS Take by mouth daily.   QUERCETIN  PO Take 560 mg by mouth daily.   temazepam  (RESTORIL ) 15 MG capsule Take 1 capsule (15 mg total) by mouth at bedtime as needed for sleep.   tretinoin (RETIN-A) 0.025 % cream APPLY TO WHOLE FACE EACH NIGHT.   Turmeric (QC TUMERIC COMPLEX PO) Take by mouth.   valsartan  (DIOVAN ) 160 MG tablet TAKE 1 TABLET BY MOUTH 2 TIMES DAILY. (Patient taking differently: Take 160 mg by mouth 2 (two) times daily. Take two by mouth am)   vitamin C (ASCORBIC ACID) 500 MG tablet  Take 1,000 mg by mouth daily.    Zinc 50 MG TABS Take 50 mg by mouth daily.   [DISCONTINUED] CALCIUM -MAGNESIUM PO Take by mouth. (Patient not taking: Reported on 03/30/2024)   No facility-administered encounter medications on file as of 03/30/2024.    Allergies (verified) Atorvastatin , Brompheniramine-pseudoeph, Codeine, Diphenhydramine  hcl, Hctz [hydrochlorothiazide ], Loratadine, Other, Tape, Triamterene , and Tramadol  hcl   History: Past Medical History:  Diagnosis Date   Anxiety    Bronchitis    Cancer (HCC)    squamous cell   Claudication (HCC)    a. 11/2021 ABIs: R 1.09, L 1.01.   Coronary artery calcification seen on CT scan    a. 11/2005 Cor CTA: Nl cors. Ca2+ = 0.  EF 56%; b. 05/2023 Cardiac CT: Ca2+ = 63.3 (36th%'ile).   COVID-19 09/01/21   home test    Degenerative disc disease, lumbar    Depression    Diastolic dysfunction    a. 02/2022 Echo: EF 55-60%, no rwma, mild LVH, GrI DD, nl RV size/fxn, RVSP 19.43mmHg, Ao sclerosis w/o stenosis.   Endometriosis 1974   s/p abdominal surgery, 2nd surgery   Fibromuscular dysplasia (HCC)    Frequent epistaxis 04/13/2021   High cholesterol    Hypertension    Hypothyroidism    Rhinosinusitis    Right renal artery stenosis (HCC)    a. 10/2021 s/p PTA/DCBA R renal artery; b. 11/2021 Renal duplex: 1-59% bilat RA stenoses; c. 02/2023 U/S: No significant RA stenosis.   Sciatica    Past Surgical History:  Procedure Laterality Date   ABDOMINAL HYSTERECTOMY  09/09/1998   APPENDECTOMY     CARPAL TUNNEL RELEASE     bilateral   CATARACT EXTRACTION W/PHACO Left 07/26/2020   Procedure: CATARACT EXTRACTION PHACO AND INTRAOCULAR LENS PLACEMENT (IOC) LEFT 6.30 01:10.2 9.0%;  Surgeon: Mittie Gaskin, MD;  Location[facility];  Service: Ophthalmology;  Laterality: Left;   CATARACT EXTRACTION W/PHACO Right 10/18/2020   Procedure: CATARACT EXTRACTION PHACO AND INTRAOCULAR LENS PLACEMENT (IOC) RIGHT 9.18 01:14.6 12.3%;  Surgeon:  Mittie Gaskin, MD;  Location: [facility];  Service: Ophthalmology;  Laterality: Right;   dental implant  2023   Endoscopy/colonoscopy  12/09/2010   Schatski's Ring , small hiatal hernia   hemilaminectomy  12/08/2008   L3-L4hemi, L4-L5 microdiskectomy   KNEE ARTHROSCOPY WITH LATERAL MENISECTOMY Right 04/08/2018   Procedure: KNEE ARTHROSCOPY WITH PARTIAL LATERAL MENISECTOMY and debridement.;  Surgeon: Edie Norleen PARAS, MD;  Location: [facility];  Service: Orthopedics;  Laterality: Right;   REFRACTIVE SURGERY Right 06/2022   RENAL ANGIOGRAPHY N/A 10/25/2021   Procedure: RENAL ANGIOGRAPHY;  Surgeon: Marea Selinda RAMAN, MD;  Location: [facility];  Service: Cardiovascular;  Laterality: N/A;   Family History  Problem Relation Age of Onset   Ovarian cancer Mother    Cancer Mother 12       ovarian cancer, treated by [facility]   Heart disease Father    Cancer Sister  79       BREAST   Breast cancer Sister    Social History   Socioeconomic History   Marital status: Widowed    Spouse name: Not on file   Number of children: Not on file   Years of education: Not on file   Highest education level: Not on file  Occupational History   Not on file  Tobacco Use   Smoking status: Never   Smokeless tobacco: Never   Tobacco comments:    smoked socially in college  Vaping Use   Vaping status: Never Used  Substance and Sexual Activity   Alcohol use: No   Drug use: No   Sexual activity: Not Currently  Other Topics Concern   Not on file  Social History Narrative   widowed   Social Drivers of Health   Financial Resource Strain: Low Risk  (03/30/2024)   Overall Financial Resource Strain (CARDIA)    Difficulty of Paying Living Expenses: Not hard at all  Food Insecurity: No Food Insecurity (03/30/2024)   Hunger Vital Sign    Worried About Running Out of Food in the Last Year: Never true    Ran Out of Food in the Last Year: Never true  Transportation Needs: No  Transportation Needs (03/30/2024)   PRAPARE - Administrator, Civil Service (Medical): No    Lack of Transportation (Non-Medical): No  Physical Activity: Insufficiently Active (03/30/2024)   Exercise Vital Sign    Days of Exercise per Week: 4 days    Minutes of Exercise per Session: 30 min  Stress: No Stress Concern Present (03/30/2024)   [employer] of Occupational Health - Occupational Stress Questionnaire    Feeling of Stress: Not at all  Social Connections: Moderately Integrated (03/30/2024)   Social Connection and Isolation Panel    Frequency of Communication with Friends and Family: More than three times a week    Frequency of Social Gatherings with Friends and Family: More than three times a week    Attends Religious Services: More than 4 times per year    Active Member of [employer] or Organizations: Yes    Attends Banker Meetings: More than 4 times per year    Marital Status: Widowed    Tobacco Counseling Counseling given: Not Answered Tobacco comments: smoked socially in college    Clinical Intake:  Pre-visit preparation completed: Yes  Pain : No/denies pain     BMI - recorded: 23.38 Nutritional Status: BMI of 19-24  Normal Nutritional Risks: None Diabetes: No  Lab Results  Component Value Date   HGBA1C 5.3 11/01/2022   HGBA1C 5.4 05/01/2022   HGBA1C 5.2 09/16/2019     How often do you need to have someone help you when you read instructions, pamphlets, or other written materials from your doctor or pharmacy?: 1 - Never  Interpreter Needed?: No  Information entered by :: R. Azuree Minish LPN   Activities of Daily Living     03/30/2024    1:38 PM  In your present state of health, do you have any difficulty performing the following activities:  Hearing? 0  Vision? 0  Comment glasses  Difficulty concentrating or making decisions? 0  Walking or climbing stairs? 0  Dressing or bathing? 0  Doing errands, shopping? 0  Preparing Food and  eating ? N  Using the Toilet? N  In the past six months, have you accidently leaked urine? N  Do you have problems with loss of bowel control?  N  Managing your Medications? N  Managing your Finances? N  Housekeeping or managing your Housekeeping? N    Patient Care Team: Marylynn Verneita CROME, MD as PCP - General (Internal Medicine) Gollan, Timothy J, MD as PCP - Cardiology (Cardiology)  I have updated your Care Teams any recent Medical Services you may have received from other providers in the past year.     Assessment:   This is a routine wellness examination for [place].  Hearing/Vision screen Hearing Screening - Comments:: No issues Vision Screening - Comments:: glasses   Goals Addressed             This Visit's Progress    Patient Stated       Wants to exercise more and lose some weight around the middle       Depression Screen     03/30/2024    1:48 PM 12/01/2023    5:16 PM 03/25/2023    3:16 PM 12/17/2022    1:09 PM 11/01/2022   11:02 AM 05/01/2022   10:35 AM 02/22/2022    8:30 AM  PHQ 2/9 Scores  PHQ - 2 Score 0 0 0 0 0 0 0  PHQ- 9 Score 0  0        Fall Risk     03/30/2024    1:41 PM 12/01/2023    5:16 PM 03/25/2023    3:21 PM 12/17/2022    1:09 PM 11/01/2022   11:02 AM  Fall Risk   Falls in the past year? 0 0 0 0 0  Number falls in past yr: 0 0 0 0 0  Injury with Fall? 0 0 0 0 0  Risk for fall due to : No Fall Risks No Fall Risks No Fall Risks No Fall Risks No Fall Risks  Follow up Falls evaluation completed;Falls prevention discussed Falls evaluation completed Falls prevention discussed;Falls evaluation completed Falls evaluation completed Falls evaluation completed    MEDICARE RISK AT HOME:  Medicare Risk at Home Any stairs in or around the home?: Yes If so, are there any without handrails?: No Home free of loose throw rugs in walkways, pet beds, electrical cords, etc?: Yes Adequate lighting in your home to reduce risk of falls?: Yes Life alert?:  No Use of a cane, walker or w/c?: No Grab bars in the bathroom?: Yes Shower chair or bench in shower?: Yes Elevated toilet seat or a handicapped toilet?: Yes  TIMED UP AND GO:  Was the test performed?  No  Cognitive Function: 6CIT completed        03/30/2024    1:53 PM 03/25/2023    3:29 PM 02/17/2019   11:49 AM  6CIT Screen  What Year? 0 points 0 points 0 points  What month? 0 points 0 points 0 points  What time? 0 points 0 points 0 points  Count back from 20 0 points 0 points 0 points  Months in reverse 0 points 0 points 0 points  Repeat phrase 0 points 0 points 0 points  Total Score 0 points 0 points 0 points    Immunizations Immunization History  Administered Date(s) Administered   Influenza Split 06/10/2012, 06/09/2013   Influenza Whole 06/06/2009   Pneumococcal Conjugate-13 07/04/2015   Pneumococcal Polysaccharide-23 04/24/2009   Td 06/06/2009, 02/25/2013   Zoster, Live 05/28/2010    Screening Tests Health Maintenance  Topic Date Due   DTaP/Tdap/Td (3 - Tdap) 02/26/2023   DEXA SCAN  03/08/2024   Medicare Annual Wellness (AWV)  03/24/2024  INFLUENZA VACCINE  04/09/2024   MAMMOGRAM  03/23/2025   Pneumococcal Vaccine: 50+ Years  Completed   Hepatitis B Vaccines  Aged Out   HPV VACCINES  Aged Out   Meningococcal B Vaccine  Aged Out   COVID-19 Vaccine  Discontinued   Hepatitis C Screening  Discontinued   Zoster Vaccines- Shingrix  Discontinued    Health Maintenance  Health Maintenance Due  Topic Date Due   DTaP/Tdap/Td (3 - Tdap) 02/26/2023   DEXA SCAN  03/08/2024   Medicare Annual Wellness (AWV)  03/24/2024   Health Maintenance Items Addressed: Discussed the need to update tetanus (Tdap). Reminded patient to call and scheduled Dexa/Bone density that was ordered 11/07/23 telephone number given.   Additional Screening:  Vision Screening: Recommended annual ophthalmology exams for early detection of glaucoma and other disorders of the eye.Up to date  [facility] Would you like a referral to an eye doctor? No    Dental Screening: Recommended annual dental exams for proper oral hygiene  Community Resource Referral / Chronic Care Management: CRR required this visit?  No   CCM required this visit?  No   Plan:    I have personally reviewed and noted the following in the patient's chart:   Medical and social history Use of alcohol, tobacco or illicit drugs  Current medications and supplements including opioid prescriptions. Patient is not currently taking opioid prescriptions. Functional ability and status Nutritional status Physical activity Advanced directives List of other physicians Hospitalizations, surgeries, and ER visits in previous 12 months Vitals Screenings to include cognitive, depression, and falls Referrals and appointments  In addition, I have reviewed and discussed with patient certain preventive protocols, quality metrics, and best practice recommendations. A written personalized care plan for preventive services as well as general preventive health recommendations were provided to patient.   Angeline Fredericks, LPN   [id number]/[id number]   After Visit Summary: (MyChart) Due to this being a telephonic visit, the after visit summary with patients personalized plan was offered to patient via MyChart   Notes: Nothing significant to report at this time.

## 2024-03-30 NOTE — Patient Instructions (Signed)
 Wendy Love , Thank you for taking time out of your busy schedule to complete your Annual Wellness Visit with me. I enjoyed our conversation and look forward to speaking with you again next year. I, as well as your care team,  appreciate your ongoing commitment to your health goals. Please review the following plan we discussed and let me know if I can assist you in the future. Your Game plan/ To Do List    Referrals: If you haven't heard from the office you've been referred to, please reach out to them at the phone provided.  Remember to call and schedule your Dexa/Bone density test that was ordered 11/07/23.  Consider updating your Tetanus (Tdap) at your pharmacy. Follow up Visits: Next Medicare AWV with our clinical staff: 04/04/25 @ 1:40   Have you seen your provider in the last 6 months (3 months if uncontrolled diabetes)? Yes Next Office Visit with your provider: 05/31/24 at 2:30  Clinician Recommendations:  Aim for 30 minutes of exercise or brisk walking, 6-8 glasses of water, and 5 servings of fruits and vegetables each day.       This is a list of the screening recommended for you and due dates:  Health Maintenance  Topic Date Due   DTaP/Tdap/Td vaccine (3 - Tdap) 02/26/2023   DEXA scan (bone density measurement)  03/08/2024   Flu Shot  04/09/2024   Mammogram  03/23/2025   Medicare Annual Wellness Visit  03/30/2025   Pneumococcal Vaccine for age over 8  Completed   Hepatitis B Vaccine  Aged Out   HPV Vaccine  Aged Out   Meningitis B Vaccine  Aged Out   COVID-19 Vaccine  Discontinued   Hepatitis C Screening  Discontinued   Zoster (Shingles) Vaccine  Discontinued    Advanced directives: (Copy Requested) Please bring a copy of your health care power of attorney and living will to the office to be added to your chart at your convenience. You can mail to [facility] [address] [place], [place] [address] or email to [email or web address] Advance Care  Planning is important because it:  [x]  Makes sure you receive the medical care that is consistent with your values, goals, and preferences  [x]  It provides guidance to your family and loved ones and reduces their decisional burden about whether or not they are making the right decisions based on your wishes.

## 2024-04-22 ENCOUNTER — Other Ambulatory Visit: Payer: Self-pay | Admitting: Internal Medicine

## 2024-04-26 ENCOUNTER — Telehealth: Payer: Self-pay

## 2024-04-26 NOTE — Telephone Encounter (Signed)
Order has been placed in quick sign folder for signature.  ?

## 2024-04-26 NOTE — Telephone Encounter (Signed)
 Copied from CRM #[id number]. Topic: General - Other >> Apr 26, 2024  4:14 PM [place] J wrote: Reason for CRM: Patient was wondering if Dr Marylynn could send her bone density order to [facility] instead.

## 2024-04-30 ENCOUNTER — Ambulatory Visit: Attending: Nurse Practitioner | Admitting: Nurse Practitioner

## 2024-04-30 ENCOUNTER — Encounter: Payer: Self-pay | Admitting: Nurse Practitioner

## 2024-04-30 NOTE — Progress Notes (Deleted)
 Office Visit    Patient Name: Wendy Love Date of Encounter: 04/30/2024  Primary Care Provider:  Marylynn Verneita CROME, MD Primary Cardiologist:  Wendy Lunger, MD    Chief Complaint    83 y.o. female with a history of paroxysmal atrial tachycardia, hypertension, aortic atherosclerosis, peripheral arterial disease, hyperlipidemia, fibromuscular dysplasia, and renal artery stenosis status post right renal artery angioplasty, who presents for f/u r/t HTN.   Past Medical History   Subjective   Past Medical History:  Diagnosis Date   Anxiety    Bronchitis    Cancer (HCC)    squamous cell   Claudication (HCC)    a. 11/2021 ABIs: R 1.09, L 1.01.   Coronary artery calcification seen on CT scan    a. 11/2005 Cor CTA: Nl cors. Ca2+ = 0.  EF 56%; b. 05/2023 Cardiac CT: Ca2+ = 63.3 (36th%'ile).   COVID-19 09/01/21   home test    Degenerative disc disease, lumbar    Depression    Diastolic dysfunction    a. 02/2022 Echo: EF 55-60%, no rwma, mild LVH, GrI DD, nl RV size/fxn, RVSP 19.42mmHg, Ao sclerosis w/o stenosis.   Endometriosis 1974   s/p abdominal surgery, 2nd surgery   Fibromuscular dysplasia (HCC)    Frequent epistaxis 04/13/2021   High cholesterol    Hypertension    Hypothyroidism    Rhinosinusitis    Right renal artery stenosis (HCC)    a. 10/2021 s/p PTA/DCBA R renal artery; b. 11/2021 Renal duplex: 1-59% bilat RA stenoses; c. 02/2023 U/S: No significant RA stenosis.   Sciatica    Past Surgical History:  Procedure Laterality Date   ABDOMINAL HYSTERECTOMY  09/09/1998   APPENDECTOMY     CARPAL TUNNEL RELEASE     bilateral   CATARACT EXTRACTION W/PHACO Left 07/26/2020   Procedure: CATARACT EXTRACTION PHACO AND INTRAOCULAR LENS PLACEMENT (IOC) LEFT 6.30 01:10.2 9.0%;  Surgeon: Wendy Gaskin, MD;  Location: [facility];  Service: Ophthalmology;  Laterality: Left;   CATARACT EXTRACTION W/PHACO Right 10/18/2020   Procedure: CATARACT EXTRACTION PHACO AND  INTRAOCULAR LENS PLACEMENT (IOC) RIGHT 9.18 01:14.6 12.3%;  Surgeon: Wendy Gaskin, MD;  Location: [facility];  Service: Ophthalmology;  Laterality: Right;   dental implant  2023   Endoscopy/colonoscopy  12/09/2010   Schatski's Ring , small hiatal hernia   hemilaminectomy  12/08/2008   L3-L4hemi, L4-L5 microdiskectomy   KNEE ARTHROSCOPY WITH LATERAL MENISECTOMY Right 04/08/2018   Procedure: KNEE ARTHROSCOPY WITH PARTIAL LATERAL MENISECTOMY and debridement.;  Surgeon: Wendy Norleen PARAS, MD;  Location: [facility];  Service: Orthopedics;  Laterality: Right;   REFRACTIVE SURGERY Right 06/2022   RENAL ANGIOGRAPHY N/A 10/25/2021   Procedure: RENAL ANGIOGRAPHY;  Surgeon: Wendy Selinda RAMAN, MD;  Location: [facility];  Service: Cardiovascular;  Laterality: N/A;    Allergies  Allergies  Allergen Reactions   Atorvastatin  Other (See Comments)    myalgias   Brompheniramine-Pseudoeph     REACTION: agitation, nervous, sleepless   Codeine     REACTION: GI Upset   Diphenhydramine  Hcl     REACTION: nervous, sleepless, agitated   Hctz [Hydrochlorothiazide ] Other (See Comments)    Severe hyponatremia   Loratadine     REACTION: nervous, sleeplessness   Other Other (See Comments)    Tape left burn-type marks on face after Cataract surgery   Tape     Tape left burn-type marks on face after Cataract surgery   Triamterene      Hyperkalemia and  hyponatremia   Tramadol  Hcl Rash    hyperactivity       History of Present Illness      83 y.o. y/o female with a history of paroxysmal atrial tachycardia, hypertension, aortic atherosclerosis, peripheral arterial disease, hyperlipidemia, fibromuscular dysplasia, and renal artery stenosis status post right renal artery angioplasty.  [name] has a long history of difficult to control hypertension.  In 2007, she had intermittent chest heaviness associated with palpitations and flushing.  Coronary CT angiogram was performed and  showed normal coronary arteries.  She has been managed with beta-blocker therapy for history of palpitations and tachycardia, and is currently on carvedilol .  In the setting of difficult to control hypertension, she underwent renal artery duplex which showed increased velocities in the right renal artery and bilateral 1 to 59% stenoses.  Findings were concerning for fibromuscular dysplasia.  She subsequently underwent angiography and successful drug-coated balloon angioplasty of the right renal artery in February 2023.  Follow-up ultrasound 08/2023 with persistent 1 to 59% bilateral renal artery stenoses.   At her request, cardiac CT was performed in September 2024 showing a calcium  score of 63.3 (36 percentile).  She was encouraged to consider initiating Zetia in the setting of known hyperlipidemia with prior statin intolerance however, she wished to defer.   [name] was last seen in cardiology in February 2025, at which time she was feeling well though reported ongoing elevation of blood pressures at home in the 150 range.  After some discussion, she was agreeable to switch from losartan  to valsartan .  Recent renal artery duplex showed stable 1-59% bilat renal artery stenoses.  ***She did not previously tolerate higher doses of amlodipine  due to lower extremity swelling, and did not tolerate higher doses of beta-blocker due to fatigue. I note that she previously had hyperkalemia with triamterene , thus spironolactone is not likely a good option.  Objective   Home Medications    Current Outpatient Medications  Medication Sig Dispense Refill   amLODipine  (NORVASC ) 5 MG tablet TAKE 1 TABLET (5 MG TOTAL) BY MOUTH DAILY. (Patient taking differently: Take 5 mg by mouth daily. Take two daily) 90 tablet 0   ASPIRIN  LOW DOSE 81 MG tablet TAKE 1 TABLET BY MOUTH EVERY DAY 120 tablet 3   b complex vitamins tablet Take 1 tablet by mouth daily.     carvedilol  (COREG ) 3.125 MG tablet TAKE 1 TABLET BY MOUTH  TWICE A DAY WITH FOOD 180 tablet 3   Cholecalciferol (VITAMIN D3) 2000 UNITS TABS Take 5,000 Units by mouth daily.     Coenzyme Q10 (CO Q-10) 100 MG CAPS Take by mouth daily.     levothyroxine  (SYNTHROID ) 75 MCG tablet TAKE 1 TABLET BY MOUTH EVERY DAY 90 tablet 0   Magnesium 300 MG CAPS Take by mouth daily.     melatonin 1 MG TABS tablet Take 2 mg by mouth at bedtime.     Multiple Vitamin (MULTIVITAMIN) tablet Take 1 tablet by mouth daily.     Omega-3 Fatty Acids (FISH OIL PO) Take by mouth daily.     PROBIOTIC CAPS Take by mouth daily.     QUERCETIN PO Take 560 mg by mouth daily.     temazepam  (RESTORIL ) 15 MG capsule Take 1 capsule (15 mg total) by mouth at bedtime as needed for sleep. 30 capsule 2   tretinoin (RETIN-A) 0.025 % cream APPLY TO WHOLE FACE EACH NIGHT.  5   Turmeric (QC TUMERIC COMPLEX PO) Take by mouth.  valsartan  (DIOVAN ) 160 MG tablet TAKE 1 TABLET BY MOUTH 2 TIMES DAILY. (Patient taking differently: Take 160 mg by mouth 2 (two) times daily. Take two by mouth am) 180 tablet 1   vitamin C (ASCORBIC ACID) 500 MG tablet Take 1,000 mg by mouth daily.      Zinc 50 MG TABS Take 50 mg by mouth daily.     No current facility-administered medications for this visit.     Physical Exam    VS:  There were no vitals taken for this visit. , BMI There is no height or weight on file to calculate BMI.          GEN: Well nourished, well developed, in no acute distress. HEENT: normal. Neck: Supple, no JVD, carotid bruits, or masses. Cardiac: RRR, no murmurs, rubs, or gallops. No clubbing, cyanosis, edema.  Radials 2+/PT 2+ and equal bilaterally.  Respiratory:  Respirations regular and unlabored, clear to auscultation bilaterally. GI: Soft, nontender, nondistended, BS + x 4. MS: no deformity or atrophy. Skin: warm and dry, no rash. Neuro:  Strength and sensation are intact. Psych: Normal affect.  Accessory Clinical Findings    ECG personally reviewed by me today -    *** - no  acute changes.  Lab Results  Component Value Date   WBC 5.4 11/07/2023   HGB 14.9 11/07/2023   HCT 43.5 11/07/2023   MCV 97.7 11/07/2023   PLT 261.0 11/07/2023   Lab Results  Component Value Date   CREATININE 0.63 11/07/2023   BUN 14 11/07/2023   NA 138 11/07/2023   K 4.0 11/07/2023   CL 101 11/07/2023   CO2 30 11/07/2023   Lab Results  Component Value Date   ALT 18 11/07/2023   AST 24 11/07/2023   ALKPHOS 54 11/07/2023   BILITOT 0.5 11/07/2023   Lab Results  Component Value Date   CHOL 245 (H) 04/23/2023   HDL 90.30 04/23/2023   LDLCALC 144 (H) 04/23/2023   LDLDIRECT 135.0 11/01/2022   TRIG 57.0 04/23/2023   CHOLHDL 3 04/23/2023    Lab Results  Component Value Date   HGBA1C 5.3 11/01/2022   Lab Results  Component Value Date   TSH 1.30 11/07/2023       Assessment & Plan    1.  ***  Lonni Meager, NP 04/30/2024, 12:21 PM

## 2024-05-07 ENCOUNTER — Ambulatory Visit: Payer: Medicare HMO | Admitting: Internal Medicine

## 2024-05-31 ENCOUNTER — Encounter: Payer: Self-pay | Admitting: Internal Medicine

## 2024-05-31 ENCOUNTER — Ambulatory Visit (INDEPENDENT_AMBULATORY_CARE_PROVIDER_SITE_OTHER): Admitting: Internal Medicine

## 2024-05-31 VITALS — BP 130/77 | HR 92 | Temp 98.0°F | Ht 63.0 in | Wt 134.2 lb

## 2024-05-31 DIAGNOSIS — E034 Atrophy of thyroid (acquired): Secondary | ICD-10-CM | POA: Diagnosis not present

## 2024-05-31 DIAGNOSIS — E559 Vitamin D deficiency, unspecified: Secondary | ICD-10-CM | POA: Diagnosis not present

## 2024-05-31 DIAGNOSIS — E7800 Pure hypercholesterolemia, unspecified: Secondary | ICD-10-CM | POA: Diagnosis not present

## 2024-05-31 DIAGNOSIS — I10 Essential (primary) hypertension: Secondary | ICD-10-CM | POA: Diagnosis not present

## 2024-05-31 DIAGNOSIS — R7301 Impaired fasting glucose: Secondary | ICD-10-CM | POA: Diagnosis not present

## 2024-05-31 MED ORDER — CARVEDILOL 3.125 MG PO TABS: 3.1250 mg | ORAL_TABLET | Freq: Two times a day (BID) | ORAL | 3 refills | Status: DC

## 2024-05-31 MED ORDER — LEVOTHYROXINE SODIUM 75 MCG PO TABS: 75.0000 ug | ORAL_TABLET | Freq: Every day | ORAL | 0 refills | Status: DC

## 2024-05-31 MED ORDER — TETANUS-DIPHTH-ACELL PERTUSSIS 5-2.5-18.5 LF-MCG/0.5 IM SUSY: 0.5000 mL | PREFILLED_SYRINGE | Freq: Once | INTRAMUSCULAR | 0 refills | Status: AC

## 2024-05-31 NOTE — Patient Instructions (Signed)
 Ok to use furosemide  every other day in the morning    If needed for pain You can take 1000 mg of acetominophen (tylenol ) at bedtime,  and 3000 mg  day safely  In divided doses Or 1000 mg every 8 hours.)

## 2024-05-31 NOTE — Progress Notes (Unsigned)
 Subjective:  Patient ID: Wendy Love, female    DOB: 05/05/1941  Age: 83 y.o. MRN: [id number]  CC: The primary encounter diagnosis was White coat syndrome with diagnosis of hypertension. Diagnoses of Hypothyroidism due to acquired atrophy of thyroid , Pure hypercholesterolemia, and Impaired fasting glucose were also pertinent to this visit.   HPI Wendy Love presents for  Chief Complaint  Patient presents with   Medical Management of Chronic Issues    6 month follow up    1) HTN: last seen in march  amlodipine  dose increased to 10 mg  at bedtime ,  valsartan  dose consolidated to 320 mg qam,   and carvedilol  continued at 3.125 mg bid.  BP ws 171 systolic in June at Dr Fransisca office.  Renal artery dopplers were repeated and both RA were widely patent     2) HH  : blood donations deferred in mARCH DUE TO elevated blood pressure    Outpatient Medications Prior to Visit  Medication Sig Dispense Refill   amLODipine  (NORVASC ) 5 MG tablet TAKE 1 TABLET (5 MG TOTAL) BY MOUTH DAILY. (Patient taking differently: Take 10 mg by mouth at bedtime.) 90 tablet 0   ASPIRIN  LOW DOSE 81 MG tablet TAKE 1 TABLET BY MOUTH EVERY DAY 120 tablet 3   b complex vitamins tablet Take 1 tablet by mouth daily.     carvedilol  (COREG ) 3.125 MG tablet TAKE 1 TABLET BY MOUTH TWICE A DAY WITH FOOD 180 tablet 3   Cholecalciferol (VITAMIN D3) 2000 UNITS TABS Take 5,000 Units by mouth daily.     Coenzyme Q10 (CO Q-10) 100 MG CAPS Take by mouth daily.     levothyroxine  (SYNTHROID ) 75 MCG tablet TAKE 1 TABLET BY MOUTH EVERY DAY 90 tablet 0   Magnesium 300 MG CAPS Take by mouth daily.     melatonin 1 MG TABS tablet Take 2 mg by mouth at bedtime.     Multiple Vitamin (MULTIVITAMIN) tablet Take 1 tablet by mouth daily.     Omega-3 Fatty Acids (FISH OIL PO) Take by mouth daily.     PROBIOTIC CAPS Take by mouth daily.     QUERCETIN PO Take 560 mg by mouth daily.     temazepam  (RESTORIL ) 15 MG capsule Take 1  capsule (15 mg total) by mouth at bedtime as needed for sleep. 30 capsule 2   tretinoin (RETIN-A) 0.025 % cream APPLY TO WHOLE FACE EACH NIGHT.  5   Turmeric (QC TUMERIC COMPLEX PO) Take by mouth.     valsartan  (DIOVAN ) 160 MG tablet TAKE 1 TABLET BY MOUTH 2 TIMES DAILY. (Patient taking differently: Take 320 mg by mouth every morning.) 180 tablet 1   vitamin C (ASCORBIC ACID) 500 MG tablet Take 1,000 mg by mouth daily.      Zinc 50 MG TABS Take 50 mg by mouth daily.     No facility-administered medications prior to visit.    Review of Systems;  Patient denies headache, fevers, malaise, unintentional weight loss, skin rash, eye pain, sinus congestion and sinus pain, sore throat, dysphagia,  hemoptysis , cough, dyspnea, wheezing, chest pain, palpitations, orthopnea, edema, abdominal pain, nausea, melena, diarrhea, constipation, flank pain, dysuria, hematuria, urinary  Frequency, nocturia, numbness, tingling, seizures,  Focal weakness, Loss of consciousness,  Tremor, insomnia, depression, anxiety, and suicidal ideation.      Objective:  Pulse 92   Temp 98 F (36.7 C) (Oral)   Ht 5' 3 (1.6 m)   Wt 134 lb  3.2 oz (60.9 kg)   SpO2 98%   BMI 23.77 kg/m   BP Readings from Last 3 Encounters:  02/17/24 (!) 171/90  12/01/23 (!) 155/88  11/07/23 132/88    Wt Readings from Last 3 Encounters:  05/31/24 134 lb 3.2 oz (60.9 kg)  03/30/24 132 lb (59.9 kg)  02/17/24 128 lb 9.6 oz (58.3 kg)    Physical Exam  Lab Results  Component Value Date   HGBA1C 5.3 11/01/2022   HGBA1C 5.4 05/01/2022   HGBA1C 5.2 09/16/2019    Lab Results  Component Value Date   CREATININE 0.63 11/07/2023   CREATININE 0.63 04/23/2023   CREATININE 0.63 11/01/2022    Lab Results  Component Value Date   WBC 5.4 11/07/2023   HGB 14.9 11/07/2023   HCT 43.5 11/07/2023   PLT 261.0 11/07/2023   GLUCOSE 92 11/07/2023   CHOL 245 (H) 04/23/2023   TRIG 57.0 04/23/2023   HDL 90.30 04/23/2023   LDLDIRECT 135.0  11/01/2022   LDLCALC 144 (H) 04/23/2023   ALT 18 11/07/2023   AST 24 11/07/2023   NA 138 11/07/2023   K 4.0 11/07/2023   CL 101 11/07/2023   CREATININE 0.63 11/07/2023   BUN 14 11/07/2023   CO2 30 11/07/2023   TSH 1.30 11/07/2023   INR 1.0 04/11/2021   HGBA1C 5.3 11/01/2022   MICROALBUR <0.7 11/07/2023    MM 3D SCREENING MAMMOGRAM BILATERAL BREAST Result Date: 03/26/2024 CLINICAL DATA:  Screening. EXAM: DIGITAL SCREENING BILATERAL MAMMOGRAM WITH TOMOSYNTHESIS AND CAD TECHNIQUE: Bilateral screening digital craniocaudal and mediolateral oblique mammograms were obtained. Bilateral screening digital breast tomosynthesis was performed. The images were evaluated with computer-aided detection. COMPARISON:  Previous exam(s). ACR Breast Density Category b: There are scattered areas of fibroglandular density. FINDINGS: There are no findings suspicious for malignancy. IMPRESSION: No mammographic evidence of malignancy. A result letter of this screening mammogram will be mailed directly to the patient. RECOMMENDATION: Screening mammogram in one year. (Code:SM-B-01Y) BI-RADS CATEGORY  1: Negative. Electronically Signed   By: Dina  Arceo M.D.   On: 03/26/2024 13:12    Assessment & Plan:  .White coat syndrome with diagnosis of hypertension  Hypothyroidism due to acquired atrophy of thyroid   Pure hypercholesterolemia  Impaired fasting glucose     I spent 34 minutes on the day of this face to face encounter reviewing patient's  most recent visit with cardiology,  nephrology,  and neurology,  prior relevant surgical and non surgical procedures, recent  labs and imaging studies, counseling on weight management,  reviewing the assessment and plan with patient, and post visit ordering and reviewing of  diagnostics and therapeutics with patient  .   Follow-up: No follow-ups on file.   Verneita LITTIE Kettering, MD

## 2024-06-01 ENCOUNTER — Ambulatory Visit: Attending: Nurse Practitioner | Admitting: Nurse Practitioner

## 2024-06-01 ENCOUNTER — Encounter: Payer: Self-pay | Admitting: Nurse Practitioner

## 2024-06-01 VITALS — BP 180/80 | HR 71 | Ht 63.0 in | Wt 131.6 lb

## 2024-06-01 DIAGNOSIS — I4719 Other supraventricular tachycardia: Secondary | ICD-10-CM | POA: Diagnosis not present

## 2024-06-01 DIAGNOSIS — I773 Arterial fibromuscular dysplasia: Secondary | ICD-10-CM | POA: Diagnosis not present

## 2024-06-01 DIAGNOSIS — I10 Essential (primary) hypertension: Secondary | ICD-10-CM

## 2024-06-01 DIAGNOSIS — E782 Mixed hyperlipidemia: Secondary | ICD-10-CM

## 2024-06-01 LAB — LIPID PANEL
Cholesterol: 235 mg/dL — ABNORMAL HIGH (ref 0–200)
HDL: 84.5 mg/dL (ref >39.00)
LDL Cholesterol: 125 mg/dL — ABNORMAL HIGH (ref 0–99)
NonHDL: 150.42
Total CHOL/HDL Ratio: 3
Triglycerides: 125 mg/dL (ref 0.0–149.0)
VLDL: 25 mg/dL (ref 0.0–40.0)

## 2024-06-01 LAB — COMPREHENSIVE METABOLIC PANEL WITH GFR
ALT: 16 U/L (ref 0–35)
AST: 21 U/L (ref 0–37)
Albumin: 4.4 g/dL (ref 3.5–5.2)
Alkaline Phosphatase: 64 U/L (ref 39–117)
BUN: 16 mg/dL (ref 6–23)
CO2: 30 meq/L (ref 19–32)
Calcium: 9.6 mg/dL (ref 8.4–10.5)
Chloride: 100 meq/L (ref 96–112)
Creatinine, Ser: 0.61 mg/dL (ref 0.40–1.20)
GFR: 82.33 mL/min (ref >60.00)
Glucose, Bld: 108 mg/dL — ABNORMAL HIGH (ref 70–99)
Potassium: 4.2 meq/L (ref 3.5–5.1)
Sodium: 137 meq/L (ref 135–145)
Total Bilirubin: 0.4 mg/dL (ref 0.2–1.2)
Total Protein: 6.7 g/dL (ref 6.0–8.3)

## 2024-06-01 LAB — VITAMIN D 25 HYDROXY (VIT D DEFICIENCY, FRACTURES): VITD: 96.76 ng/mL (ref 30.00–100.00)

## 2024-06-01 LAB — TSH: TSH: 1.28 u[IU]/mL (ref 0.35–5.50)

## 2024-06-01 LAB — LDL CHOLESTEROL, DIRECT: Direct LDL: 125 mg/dL

## 2024-06-01 NOTE — Progress Notes (Signed)
 Office Visit    Patient Name: Wendy Love Date of Encounter: 06/01/2024  Primary Care Provider:  Marylynn Verneita CROME, MD Primary Cardiologist:  Wendy Lunger, MD    Chief Complaint    83 y.o. female with a history of paroxysmal atrial tachycardia, hypertension, aortic atherosclerosis, peripheral arterial disease, hyperlipidemia, fibromuscular dysplasia, and renal artery stenosis status post right renal artery angioplasty, who presents for f/u r/t HTN.   Past Medical History   Subjective   Past Medical History:  Diagnosis Date   Anxiety    Bronchitis    Cancer (HCC)    squamous cell   Claudication    a. 11/2021 ABIs: R 1.09, L 1.01.   Coronary artery calcification seen on CT scan    a. 11/2005 Cor CTA: Nl cors. Ca2+ = 0.  EF 56%; b. 05/2023 Cardiac CT: Ca2+ = 63.3 (36th%'ile).   COVID-19 09/01/21   home test    Degenerative disc disease, lumbar    Depression    Diastolic dysfunction    a. 02/2022 Echo: EF 55-60%, no rwma, mild LVH, GrI DD, nl RV size/fxn, RVSP 19.75mmHg, Ao sclerosis w/o stenosis.   Endometriosis 1974   s/p abdominal surgery, 2nd surgery   Fibromuscular dysplasia    Frequent epistaxis 04/13/2021   High cholesterol    Hypertension    Hypothyroidism    Rhinosinusitis    Right renal artery stenosis    a. 10/2021 s/p PTA/DCBA R renal artery; b. 11/2021 Renal duplex: 1-59% bilat RA stenoses; c. 02/2024 U/S: 1-59% bilat RA stenoses.   Sciatica    Past Surgical History:  Procedure Laterality Date   ABDOMINAL HYSTERECTOMY  09/09/1998   APPENDECTOMY     CARPAL TUNNEL RELEASE     bilateral   CATARACT EXTRACTION W/PHACO Left 07/26/2020   Procedure: CATARACT EXTRACTION PHACO AND INTRAOCULAR LENS PLACEMENT (IOC) LEFT 6.30 01:10.2 9.0%;  Surgeon: Wendy Gaskin, MD;  Location: [facility];  Service: Ophthalmology;  Laterality: Left;   CATARACT EXTRACTION W/PHACO Right 10/18/2020   Procedure: CATARACT EXTRACTION PHACO AND INTRAOCULAR LENS  PLACEMENT (IOC) RIGHT 9.18 01:14.6 12.3%;  Surgeon: Wendy Gaskin, MD;  Location: [facility];  Service: Ophthalmology;  Laterality: Right;   dental implant  2023   Endoscopy/colonoscopy  12/09/2010   Schatski's Ring , small hiatal hernia   hemilaminectomy  12/08/2008   L3-L4hemi, L4-L5 microdiskectomy   KNEE ARTHROSCOPY WITH LATERAL MENISECTOMY Right 04/08/2018   Procedure: KNEE ARTHROSCOPY WITH PARTIAL LATERAL MENISECTOMY and debridement.;  Surgeon: Wendy Norleen PARAS, MD;  Location: [facility];  Service: Orthopedics;  Laterality: Right;   REFRACTIVE SURGERY Right 06/2022   RENAL ANGIOGRAPHY N/A 10/25/2021   Procedure: RENAL ANGIOGRAPHY;  Surgeon: Wendy Selinda RAMAN, MD;  Location: [facility];  Service: Cardiovascular;  Laterality: N/A;    Allergies  Allergies  Allergen Reactions   Atorvastatin  Other (See Comments)    myalgias   Brompheniramine-Pseudoeph     REACTION: agitation, nervous, sleepless   Codeine     REACTION: GI Upset   Diphenhydramine  Hcl     REACTION: nervous, sleepless, agitated   Hctz [Hydrochlorothiazide ] Other (See Comments)    Severe hyponatremia   Loratadine     REACTION: nervous, sleeplessness   Other Other (See Comments)    Tape left burn-type marks on face after Cataract surgery   Tape     Tape left burn-type marks on face after Cataract surgery   Triamterene      Hyperkalemia and hyponatremia  Tramadol  Hcl Rash    hyperactivity       History of Present Illness      83 y.o. female with a history of paroxysmal atrial tachycardia, hypertension, aortic atherosclerosis, peripheral arterial disease, hyperlipidemia, fibromuscular dysplasia, and renal artery stenosis status post right renal artery angioplasty.  [name] has a long history of difficult to control hypertension.  In 2007, she had intermittent chest heaviness associated with palpitations and flushing.  Coronary CT angiogram was performed and showed normal coronary  arteries.  She has been managed with beta-blocker therapy for history of palpitations and tachycardia, and is currently on carvedilol .  In the setting of difficult to control hypertension, she underwent renal artery duplex which showed increased velocities in the right renal artery and bilateral 1 to 59% stenoses.  Findings were concerning for fibromuscular dysplasia.  She subsequently underwent angiography and successful drug-coated balloon angioplasty of the right renal artery in February 2023.  Follow-up ultrasound 08/2023 with persistent 1 to 59% bilateral renal artery stenoses.     [name] was last seen in cardiology clinic on 11/07/2023, at which time she was hypertensive (192/104) and we discussed switching from losartan  to valsartan . She preferred to discuss w/ her PCP, who subsequently switched to valsartan  and titrated/consolidated to 320 mg daily.  [name] has also been taking amlodipine  10 mg daily, despite mild ankle edema on this dose, and carvedilol  3.125 mg BID (dose limited by prior intolerance of higher doses).  On this regimen, she has been feeling well and notes that BPs are typically in the 130 systolic range @ home.  She states she has no issues with ADLs and she walks 4-5x per week 20-30 minutes. She does endorse some mild ankle swelling d/t amlodipine , though typically only noted at the end of the day, as well as some degree of chronic, mild fatigue.  She denies chest pain, dyspnea, palpitations, PND, orthopnea, dizziness, or syncope.   Objective   Home Medications    Current Outpatient Medications  Medication Sig Dispense Refill   amLODipine  (NORVASC ) 5 MG tablet TAKE 1 TABLET (5 MG TOTAL) BY MOUTH DAILY. (Patient taking differently: Take 10 mg by mouth at bedtime.) 90 tablet 0   ASPIRIN  LOW DOSE 81 MG tablet TAKE 1 TABLET BY MOUTH EVERY DAY 120 tablet 3   b complex vitamins tablet Take 1 tablet by mouth daily.     carvedilol  (COREG ) 3.125 MG tablet Take 1 tablet  (3.125 mg total) by mouth 2 (two) times daily with a meal. 180 tablet 3   Cholecalciferol (VITAMIN D3) 2000 UNITS TABS Take 5,000 Units by mouth daily.     Coenzyme Q10 (CO Q-10) 100 MG CAPS Take by mouth daily.     levothyroxine  (SYNTHROID ) 75 MCG tablet Take 1 tablet (75 mcg total) by mouth daily. 90 tablet 0   Magnesium 300 MG CAPS Take by mouth daily.     melatonin 1 MG TABS tablet Take 2 mg by mouth at bedtime.     Multiple Vitamin (MULTIVITAMIN) tablet Take 1 tablet by mouth daily.     Omega-3 Fatty Acids (FISH OIL PO) Take by mouth daily.     PROBIOTIC CAPS Take by mouth daily.     QUERCETIN PO Take 560 mg by mouth daily.     temazepam  (RESTORIL ) 15 MG capsule Take 1 capsule (15 mg total) by mouth at bedtime as needed for sleep. 30 capsule 2   tretinoin (RETIN-A) 0.025 % cream APPLY TO WHOLE FACE EACH  NIGHT.  5   Turmeric (QC TUMERIC COMPLEX PO) Take by mouth.     valsartan  (DIOVAN ) 160 MG tablet TAKE 1 TABLET BY MOUTH 2 TIMES DAILY. (Patient taking differently: Take 320 mg by mouth every morning.) 180 tablet 1   vitamin C (ASCORBIC ACID) 500 MG tablet Take 1,000 mg by mouth daily.      Zinc 50 MG TABS Take 50 mg by mouth daily.     No current facility-administered medications for this visit.     Physical Exam    VS:   Vitals:   06/01/24 1329 06/01/24 1411  BP: (!) 150/74 (!) 180/80  Pulse: 71   SpO2: 98%    Body mass index is 23.31 kg/m.           GEN: Well nourished, well developed, in no acute distress. HEENT: normal. Neck: Supple, no JVD, carotid bruits, or masses. Cardiac: RRR, no murmurs, rubs, or gallops. No clubbing, cyanosis, edema.  Radials 2+/PT 2+ and equal bilaterally.  Respiratory:  Respirations regular and unlabored, clear to auscultation bilaterally. GI: Soft, nontender, nondistended, BS + x 4. MS: no deformity or atrophy. Skin: warm and dry, no rash on exposed skin Neuro:  Strength and sensation are intact. Psych: Normal affect.  Accessory  Clinical Findings    Lab Results  Component Value Date   WBC 5.4 11/07/2023   HGB 14.9 11/07/2023   HCT 43.5 11/07/2023   MCV 97.7 11/07/2023   PLT 261.0 11/07/2023   Lab Results  Component Value Date   CREATININE 0.61 05/31/2024   BUN 16 05/31/2024   NA 137 05/31/2024   K 4.2 05/31/2024   CL 100 05/31/2024   CO2 30 05/31/2024   Lab Results  Component Value Date   ALT 16 05/31/2024   AST 21 05/31/2024   ALKPHOS 64 05/31/2024   BILITOT 0.4 05/31/2024   Lab Results  Component Value Date   CHOL 235 (H) 05/31/2024   HDL 84.50 05/31/2024   LDLCALC 125 (H) 05/31/2024   LDLDIRECT 125.0 05/31/2024   TRIG 125.0 05/31/2024   CHOLHDL 3 05/31/2024    Lab Results  Component Value Date   HGBA1C 5.3 11/01/2022   Lab Results  Component Value Date   TSH 1.28 05/31/2024       Assessment & Plan    1.  Primary hypertension: History of uncontrolled hypertension s/p right renal artery angioplasty 10/2021 in the setting of FMD w/ stable findings on renal duplex in 08/2023.  Her medication regimen has been adjusted over the past 6 mos and she has noted improved control @ home, with systolics typically in the 130 range.  She is still prone to white coat HTN @ provider offices, and BP is 150/74 today.  She is overall feeling well and tolerating her regimen, though is somewhat bothered by mild ankle edema in the setting of amlodipine  therapy, but notes that it is tolerable and can likely be managed with leg elevation and compression.  She does not have any edema today.  She will continue to follow blood pressures at home.  No changes to regimen today.  2.  Fibromuscular dysplasia/right renal artery stenosis: Status post angioplasty in February 2023.  Followed by vascular surgery with renal duplex in December 2024 showing stable, 1 to 59% bilateral stenoses.  She remains on aspirin  therapy.   3.  Paroxysmal atrial tachycardia: Quiescent on carvedilol  therapy.   4.  Hyperlipidemia/coronary  calcium : Coronary calcium  score 63.3 in September 2024, placing her in  the 36 percentile for age/sex.  LDL of 125 on most recent labs 05/31/2024 with a total cholesterol of 235.  Coronary calcium  score 63.3 in September 2024.  Previously statin intolerant and reluctant to try other options.    5.  Disposition: Follow-up in 1 year or sooner if necessary.  Lonni Meager, NP 06/01/2024, 1:06 PM

## 2024-06-01 NOTE — Patient Instructions (Signed)
 Medication Instructions:  Your physician recommends that you continue on your current medications as directed. Please refer to the Current Medication list given to you today.   *If you need a refill on your cardiac medications before your next appointment, please call your pharmacy*  Lab Work:  No labs ordered today   If you have labs (blood work) drawn today and your tests are completely normal, you will receive your results only by: MyChart Message (if you have MyChart) OR A paper copy in the mail If you have any lab test that is abnormal or we need to change your treatment, we will call you to review the results.  Testing/Procedures:  No test ordered today   Follow-Up: At [facility], you and your health needs are our priority.  As part of our continuing mission to provide you with exceptional heart care, our providers are all part of one team.  This team includes your primary Cardiologist (physician) and Advanced Practice Providers or APPs (Physician Assistants and Nurse Practitioners) who all work together to provide you with the care you need, when you need it.  Your next appointment:   12 month(s)  Provider:   You may see Timothy Gollan, MD or one of the following Advanced Practice Providers on your designated Care Team:    Lonni Meager, NP   We recommend signing up for the patient portal called MyChart.  Sign up information is provided on this After Visit Summary.  MyChart is used to connect with patients for Virtual Visits (Telemedicine).  Patients are able to view lab/test results, encounter notes, upcoming appointments, etc.  Non-urgent messages can be sent to your provider as well.   To learn more about what you can do with MyChart, go to [email or web address].

## 2024-06-01 NOTE — Assessment & Plan Note (Signed)
 BP has improved with current regimen of amlodipine  10 mg daily.  Valsartan  320 mg daily and carvedilol  l 3.125 mg bid.  No changes today

## 2024-06-01 NOTE — Assessment & Plan Note (Signed)
 Thyroid  function is WNL on current dose. Of 75 mcg daily levothyroxine .  No current changes needed.   Lab Results  Component Value Date   TSH 1.28 05/31/2024

## 2024-06-02 ENCOUNTER — Ambulatory Visit: Payer: Self-pay | Admitting: Internal Medicine

## 2024-06-02 ENCOUNTER — Encounter: Payer: Self-pay | Admitting: Internal Medicine

## 2024-06-03 NOTE — Telephone Encounter (Signed)
 In response to result message sent via mychart    06/02/24 10:08 PM Dr. Marylynn  I need clarification on your instructions to reduce Vitamin D  to once a week. For how long? Also what does reduce other medications mean? Thank you.  (Original message also attached)

## 2024-06-11 ENCOUNTER — Encounter: Payer: Self-pay | Admitting: Internal Medicine

## 2024-06-11 DIAGNOSIS — Z780 Asymptomatic menopausal state: Secondary | ICD-10-CM | POA: Diagnosis not present

## 2024-06-11 DIAGNOSIS — M85852 Other specified disorders of bone density and structure, left thigh: Secondary | ICD-10-CM | POA: Diagnosis not present

## 2024-06-11 LAB — HM DEXA SCAN: HM Dexa Scan: NORMAL

## 2024-06-17 NOTE — Progress Notes (Signed)
 EMMAJANE ALTAMURA                                          MRN: [id number]   06/17/2024   The [facility] Quality Team Specialist reviewed this patient medical record for the purposes of chart review for care gap closure. The following were reviewed: chart review for care gap closure-controlling blood pressure.    [facility] Quality Team

## 2024-07-18 ENCOUNTER — Other Ambulatory Visit: Payer: Self-pay | Admitting: Internal Medicine

## 2024-07-19 ENCOUNTER — Other Ambulatory Visit: Payer: Self-pay | Admitting: Internal Medicine

## 2024-07-19 DIAGNOSIS — I10 Essential (primary) hypertension: Secondary | ICD-10-CM

## 2024-07-24 ENCOUNTER — Other Ambulatory Visit: Payer: Self-pay | Admitting: Internal Medicine

## 2024-08-13 DIAGNOSIS — H40003 Preglaucoma, unspecified, bilateral: Secondary | ICD-10-CM | POA: Diagnosis not present

## 2024-08-17 ENCOUNTER — Encounter (INDEPENDENT_AMBULATORY_CARE_PROVIDER_SITE_OTHER): Payer: Self-pay | Admitting: Vascular Surgery

## 2024-08-17 ENCOUNTER — Other Ambulatory Visit (INDEPENDENT_AMBULATORY_CARE_PROVIDER_SITE_OTHER)

## 2024-08-17 ENCOUNTER — Ambulatory Visit (INDEPENDENT_AMBULATORY_CARE_PROVIDER_SITE_OTHER): Admitting: Vascular Surgery

## 2024-08-17 VITALS — BP 194/96 | HR 73 | Resp 18 | Ht 62.5 in | Wt 128.2 lb

## 2024-08-17 DIAGNOSIS — I150 Renovascular hypertension: Secondary | ICD-10-CM

## 2024-08-17 DIAGNOSIS — I773 Arterial fibromuscular dysplasia: Secondary | ICD-10-CM

## 2024-08-17 DIAGNOSIS — E7800 Pure hypercholesterolemia, unspecified: Secondary | ICD-10-CM | POA: Diagnosis not present

## 2024-08-17 DIAGNOSIS — I6521 Occlusion and stenosis of right carotid artery: Secondary | ICD-10-CM

## 2024-08-17 NOTE — Progress Notes (Signed)
 MRN : [id number]  Wendy Love is a 83 y.o. ([birth year]) female who presents with chief complaint of No chief complaint on file. SABRA  History of Present Illness:   Discussed the use of AI scribe software for clinical note transcription with the patient, who gave verbal consent to proceed.  History of Present Illness Wendy Love is an 83 year old female with fibromuscular dysplasia who presents for a follow-up visit regarding her vascular health.  Recent renal ultrasounds and imaging of the prior angioplasty site are normal. The left renal artery has normal velocity. Carotid imaging shows mild disease without significant stenosis and fibromuscular dysplasia in the carotid artery, more prominent and tortuous on the right.  Her blood pressure was previously elevated. Her primary care physician changed her to valsartan  per vascular recommendation. She currently takes valsartan  twice in the morning, carvedilol  once in the afternoon, and amlodipine  in the afternoon. She has not been checking her blood pressure regularly at home.  But when she has checked it, it has been under decent control.  She always has whitecoat elevation at the doctor's office and her blood pressure is high today.  She has not had any focal neurologic symptoms of cerebrovascular ischemia. Specifically, the patient denies amaurosis fugax, speech or swallowing difficulties, or arm or leg weakness or numbness     Results RADIOLOGY Renal Ultrasound: Both kidneys are normal, angioplasty area is normal, normal velocity on the left side. Carotid Ultrasound: Very mild disease, no significant flow limitation, right side tortuous with fibromuscular dysplasia (FMD), left side small plaque without blockage or narrowing of greater than 40%  Current Outpatient Medications  Medication Sig Dispense Refill   amLODipine  (NORVASC ) 5 MG tablet TAKE 1 TABLET (5 MG TOTAL) BY MOUTH DAILY. 90 tablet 0   ASPIRIN  LOW  DOSE 81 MG tablet TAKE 1 TABLET BY MOUTH EVERY DAY 120 tablet 3   b complex vitamins tablet Take 1 tablet by mouth daily.     carvedilol  (COREG ) 3.125 MG tablet Take 1 tablet (3.125 mg total) by mouth 2 (two) times daily with a meal. 180 tablet 3   Cholecalciferol  (VITAMIN D3) 2000 UNITS TABS Take 5,000 Units by mouth daily.     Coenzyme Q10 (CO Q-10) 100 MG CAPS Take by mouth daily.     levothyroxine  (SYNTHROID ) 75 MCG tablet TAKE 1 TABLET BY MOUTH EVERY DAY 90 tablet 0   Magnesium  300 MG CAPS Take by mouth daily.     melatonin 1 MG TABS tablet Take 2 mg by mouth at bedtime.     Multiple Vitamin (MULTIVITAMIN) tablet Take 1 tablet by mouth daily.     Omega-3 Fatty Acids (FISH OIL PO) Take by mouth daily.     PROBIOTIC CAPS Take by mouth daily.     QUERCETIN PO Take 560 mg by mouth daily.     temazepam  (RESTORIL ) 15 MG capsule Take 1 capsule (15 mg total) by mouth at bedtime as needed for sleep. 30 capsule 2   tretinoin (RETIN-A) 0.025 % cream APPLY TO WHOLE FACE EACH NIGHT.  5   Turmeric (QC TUMERIC COMPLEX PO) Take by mouth.     valsartan  (DIOVAN ) 160 MG tablet TAKE 1 TABLET BY MOUTH TWICE A DAY 180 tablet 1   vitamin C (ASCORBIC ACID) 500 MG tablet Take 1,000 mg by mouth daily.      Zinc 50 MG TABS Take 50 mg by mouth daily.     No current facility-administered medications for this visit.  Past Medical History:  Diagnosis Date   Anxiety    Bronchitis    Cancer (HCC)    squamous cell   Claudication    a. 11/2021 ABIs: R 1.09, L 1.01.   Coronary artery calcification seen on CT scan    a. 11/2005 Cor CTA: Nl cors. Ca2+ = 0.  EF 56%; b. 05/2023 Cardiac CT: Ca2+ = 63.3 (36th%'ile).   COVID-19 09/01/21   home test    Degenerative disc disease, lumbar    Depression    Diastolic dysfunction    a. 02/2022 Echo: EF 55-60%, no rwma, mild LVH, GrI DD, nl RV size/fxn, RVSP 19.69mmHg, Ao sclerosis w/o stenosis.   Endometriosis 1974   s/p abdominal surgery, 2nd surgery   Fibromuscular  dysplasia    Frequent epistaxis 04/13/2021   High cholesterol    Hypertension    Hypothyroidism    Rhinosinusitis    Right renal artery stenosis    a. 10/2021 s/p PTA/DCBA R renal artery; b. 11/2021 Renal duplex: 1-59% bilat RA stenoses; c. 02/2024 U/S: 1-59% bilat RA stenoses.   Sciatica     Past Surgical History:  Procedure Laterality Date   ABDOMINAL HYSTERECTOMY  09/09/1998   APPENDECTOMY     CARPAL TUNNEL RELEASE     bilateral   CATARACT EXTRACTION W/PHACO Left 07/26/2020   Procedure: CATARACT EXTRACTION PHACO AND INTRAOCULAR LENS PLACEMENT (IOC) LEFT 6.30 01:10.2 9.0%;  Surgeon: Mittie Gaskin, MD;  Location: [facility];  Service: Ophthalmology;  Laterality: Left;   CATARACT EXTRACTION W/PHACO Right 10/18/2020   Procedure: CATARACT EXTRACTION PHACO AND INTRAOCULAR LENS PLACEMENT (IOC) RIGHT 9.18 01:14.6 12.3%;  Surgeon: Mittie Gaskin, MD;  Location: [facility];  Service: Ophthalmology;  Laterality: Right;   dental implant  2023   Endoscopy/colonoscopy  12/09/2010   Schatski's Ring , small hiatal hernia   hemilaminectomy  12/08/2008   L3-L4hemi, L4-L5 microdiskectomy   KNEE ARTHROSCOPY WITH LATERAL MENISECTOMY Right 04/08/2018   Procedure: KNEE ARTHROSCOPY WITH PARTIAL LATERAL MENISECTOMY and debridement.;  Surgeon: Edie Norleen PARAS, MD;  Location: [facility];  Service: Orthopedics;  Laterality: Right;   REFRACTIVE SURGERY Right 06/2022   RENAL ANGIOGRAPHY N/A 10/25/2021   Procedure: RENAL ANGIOGRAPHY;  Surgeon: Marea Selinda RAMAN, MD;  Location: [facility];  Service: Cardiovascular;  Laterality: N/A;     Social History   Tobacco Use   Smoking status: Never   Smokeless tobacco: Never   Tobacco comments:    smoked socially in college  Vaping Use   Vaping status: Never Used  Substance Use Topics   Alcohol use: No   Drug use: No      Family History  Problem Relation Age of Onset   Ovarian cancer Mother    Cancer Mother 34        ovarian cancer, treated by [facility]   Heart disease Father    Cancer Sister 48       BREAST   Breast cancer Sister      Allergies  Allergen Reactions   Atorvastatin  Other (See Comments)    myalgias   Brompheniramine-Pseudoeph     REACTION: agitation, nervous, sleepless   Codeine     REACTION: GI Upset   Diphenhydramine  Hcl     REACTION: nervous, sleepless, agitated   Hctz [Hydrochlorothiazide ] Other (See Comments)    Severe hyponatremia   Loratadine     REACTION: nervous, sleeplessness   Other Other (See Comments)    Tape left burn-type marks on face after  Cataract surgery   Tape     Tape left burn-type marks on face after Cataract surgery   Triamterene      Hyperkalemia and hyponatremia   Tramadol  Hcl Rash    hyperactivity     REVIEW OF SYSTEMS (Negative unless checked)  Constitutional: [] Weight loss  [] Fever  [] Chills Cardiac: [] Chest pain   [] Chest pressure   [] Palpitations   [] Shortness of breath when laying flat   [] Shortness of breath at rest   [] Shortness of breath with exertion. Vascular:  [] Pain in legs with walking   [] Pain in legs at rest   [] Pain in legs when laying flat   [] Claudication   [] Pain in feet when walking  [] Pain in feet at rest  [] Pain in feet when laying flat   [] History of DVT   [] Phlebitis   [] Swelling in legs   [] Varicose veins   [] Non-healing ulcers Pulmonary:   [] Uses home oxygen   [] Productive cough   [] Hemoptysis   [] Wheeze  [] COPD   [] Asthma Neurologic:  [] Dizziness  [] Blackouts   [] Seizures   [] History of stroke   [] History of TIA  [] Aphasia   [] Temporary blindness   [] Dysphagia   [] Weakness or numbness in arms   [] Weakness or numbness in legs Musculoskeletal:  [] Arthritis   [] Joint swelling   [x] Joint pain   [x] Low back pain Hematologic:  [] Easy bruising  [] Easy bleeding   [] Hypercoagulable state   [] Anemic   Gastrointestinal:  [] Blood in stool   [] Vomiting blood  [] Gastroesophageal reflux/heartburn   [] Abdominal pain Genitourinary:   [] Chronic kidney disease   [] Difficult urination  [] Frequent urination  [] Burning with urination   [] Hematuria Skin:  [] Rashes   [] Ulcers   [] Wounds Psychological:  [x] History of anxiety   []  History of major depression.  Physical Examination  There were no vitals taken for this visit. Gen:  WD/WN, NAD. Appears younger than stated age. Head: Dowell/AT, No temporalis wasting. Ear/Nose/Throat: Hearing grossly intact, nares w/o erythema or drainage Eyes: Conjunctiva clear. Sclera non-icteric Neck: Supple.  Trachea midline Pulmonary:  Good air movement, no use of accessory muscles.  Cardiac: RRR, no JVD Vascular:  Vessel Right Left  Radial Palpable Palpable           Musculoskeletal: M/S 5/5 throughout.  No deformity or atrophy. No edema. Neurologic: Sensation grossly intact in extremities.  Symmetrical.  Speech is fluent.  Psychiatric: Judgment intact, Mood & affect appropriate for pt's clinical situation. Dermatologic: No rashes or ulcers noted.  No cellulitis or open wounds.  Physical Exam     Labs Recent Results (from the past 2160 hours)  Lipid Profile     Status: Abnormal   Collection Time: 05/31/24  3:06 PM  Result Value Ref Range   Cholesterol 235 (H) 0 - 200 mg/dL    Comment: ATP III Classification       Desirable:  < 200 mg/dL               Borderline High:  200 - 239 mg/dL          High:  > = 759 mg/dL   Triglycerides 874.9 0.0 - 149.0 mg/dL    Comment: Normal:  <849 mg/dLBorderline High:  150 - 199 mg/dL   HDL 15.49 >60.99 mg/dL   VLDL 74.9 0.0 - 59.9 mg/dL   LDL Cholesterol 874 (H) 0 - 99 mg/dL   Total CHOL/HDL Ratio 3     Comment:  Men          Women1/2 Average Risk     3.4          3.3Average Risk          5.0          4.42X Average Risk          9.6          7.13X Average Risk          15.0          11.0                       NonHDL 150.42     Comment: NOTE:  Non-HDL goal should be 30 mg/dL higher than patient's LDL goal (i.e. LDL goal of < 70 mg/dL,  would have non-HDL goal of < 100 mg/dL)  Direct LDL     Status: None   Collection Time: 05/31/24  3:06 PM  Result Value Ref Range   Direct LDL 125.0 mg/dL    Comment: Optimal:  <899 mg/dLNear or Above Optimal:  100-129 mg/dLBorderline High:  130-159 mg/dLHigh:  160-189 mg/dLVery High:  >190 mg/dL  Comp Met (CMET)     Status: Abnormal   Collection Time: 05/31/24  3:06 PM  Result Value Ref Range   Sodium 137 135 - 145 mEq/L   Potassium 4.2 3.5 - 5.1 mEq/L   Chloride 100 96 - 112 mEq/L   CO2 30 19 - 32 mEq/L   Glucose, Bld 108 (H) 70 - 99 mg/dL   BUN 16 6 - 23 mg/dL   Creatinine, Ser 9.38 0.40 - 1.20 mg/dL   Total Bilirubin 0.4 0.2 - 1.2 mg/dL   Alkaline Phosphatase 64 39 - 117 U/L   AST 21 0 - 37 U/L   ALT 16 0 - 35 U/L   Total Protein 6.7 6.0 - 8.3 g/dL   Albumin 4.4 3.5 - 5.2 g/dL   GFR 17.66 >39.99 mL/min    Comment: Calculated using the CKD-EPI Creatinine Equation (2021)   Calcium  9.6 8.4 - 10.5 mg/dL  TSH     Status: None   Collection Time: 05/31/24  3:06 PM  Result Value Ref Range   TSH 1.28 0.35 - 5.50 uIU/mL  VITAMIN D  25 Hydroxy (Vit-D Deficiency, Fractures)     Status: None   Collection Time: 05/31/24  3:06 PM  Result Value Ref Range   VITD 96.76 30.00 - 100.00 ng/mL  HM DEXA SCAN     Status: None   Collection Time: 06/11/24 12:06 PM  Result Value Ref Range   HM Dexa Scan Normal     Comment: Abstracted by HIM    Radiology No results found.  Assessment/Plan  Assessment & Plan Fibromuscular dysplasia of renal artery Renal arteries showed normal velocity with no significant stenosis or flow limitation. Previous angioplasty site was widely patent. - Continue current management and monitoring. - Scheduled annual follow-up ultrasound to monitor renal arteries.   Carotid artery fibromuscular dysplasia and stenosis Mild carotid artery disease with tortuous anatomy on the right side, likely fibromuscular dysplasia. Left side showed small plaque without significant  stenosis. No immediate intervention required. - Continue annual monitoring of carotid arteries. - Will discuss potential angioplasty +/- stenting if significant stenosis develops.  Renovascular hypertension Blood pressure control is improved after renal artery intervention previously, but has worsened over time.     Hyperlipidemia lipid control important in reducing the progression of atherosclerotic disease. Continue  statin therapy   Selinda Gu, MD  08/17/2024 8:28 AM    This note was created with Dragon medical transcription system.  Any errors from dictation are purely unintentional

## 2024-08-20 DIAGNOSIS — H40003 Preglaucoma, unspecified, bilateral: Secondary | ICD-10-CM | POA: Diagnosis not present

## 2024-08-20 DIAGNOSIS — Z961 Presence of intraocular lens: Secondary | ICD-10-CM | POA: Diagnosis not present

## 2024-08-20 DIAGNOSIS — H43813 Vitreous degeneration, bilateral: Secondary | ICD-10-CM | POA: Diagnosis not present

## 2024-08-20 DIAGNOSIS — H04123 Dry eye syndrome of bilateral lacrimal glands: Secondary | ICD-10-CM | POA: Diagnosis not present

## 2024-08-21 ENCOUNTER — Emergency Department

## 2024-08-21 ENCOUNTER — Inpatient Hospital Stay
Admission: EM | Admit: 2024-08-21 | Discharge: 2024-08-25 | DRG: 065 | Disposition: A | Attending: Internal Medicine | Admitting: Internal Medicine

## 2024-08-21 ENCOUNTER — Other Ambulatory Visit: Payer: Self-pay

## 2024-08-21 DIAGNOSIS — E7800 Pure hypercholesterolemia, unspecified: Secondary | ICD-10-CM | POA: Diagnosis present

## 2024-08-21 DIAGNOSIS — I2510 Atherosclerotic heart disease of native coronary artery without angina pectoris: Secondary | ICD-10-CM | POA: Diagnosis present

## 2024-08-21 DIAGNOSIS — I5032 Chronic diastolic (congestive) heart failure: Secondary | ICD-10-CM | POA: Diagnosis present

## 2024-08-21 DIAGNOSIS — Z79890 Hormone replacement therapy (postmenopausal): Secondary | ICD-10-CM

## 2024-08-21 DIAGNOSIS — R4182 Altered mental status, unspecified: Secondary | ICD-10-CM

## 2024-08-21 DIAGNOSIS — Z8249 Family history of ischemic heart disease and other diseases of the circulatory system: Secondary | ICD-10-CM

## 2024-08-21 DIAGNOSIS — I10 Essential (primary) hypertension: Secondary | ICD-10-CM | POA: Diagnosis not present

## 2024-08-21 DIAGNOSIS — Z803 Family history of malignant neoplasm of breast: Secondary | ICD-10-CM

## 2024-08-21 DIAGNOSIS — E039 Hypothyroidism, unspecified: Secondary | ICD-10-CM | POA: Diagnosis present

## 2024-08-21 DIAGNOSIS — Z1152 Encounter for screening for COVID-19: Secondary | ICD-10-CM

## 2024-08-21 DIAGNOSIS — I150 Renovascular hypertension: Secondary | ICD-10-CM | POA: Diagnosis present

## 2024-08-21 DIAGNOSIS — I773 Arterial fibromuscular dysplasia: Secondary | ICD-10-CM | POA: Diagnosis present

## 2024-08-21 DIAGNOSIS — I6389 Other cerebral infarction: Principal | ICD-10-CM | POA: Diagnosis present

## 2024-08-21 DIAGNOSIS — Z7982 Long term (current) use of aspirin: Secondary | ICD-10-CM

## 2024-08-21 DIAGNOSIS — I160 Hypertensive urgency: Secondary | ICD-10-CM | POA: Diagnosis present

## 2024-08-21 DIAGNOSIS — R531 Weakness: Secondary | ICD-10-CM | POA: Diagnosis not present

## 2024-08-21 DIAGNOSIS — R4701 Aphasia: Secondary | ICD-10-CM | POA: Diagnosis present

## 2024-08-21 DIAGNOSIS — R000 Tachycardia, unspecified: Secondary | ICD-10-CM | POA: Diagnosis not present

## 2024-08-21 DIAGNOSIS — G4700 Insomnia, unspecified: Secondary | ICD-10-CM | POA: Diagnosis present

## 2024-08-21 DIAGNOSIS — Z8041 Family history of malignant neoplasm of ovary: Secondary | ICD-10-CM

## 2024-08-21 DIAGNOSIS — Z90710 Acquired absence of both cervix and uterus: Secondary | ICD-10-CM

## 2024-08-21 DIAGNOSIS — I700 Atherosclerosis of aorta: Secondary | ICD-10-CM | POA: Diagnosis not present

## 2024-08-21 DIAGNOSIS — I110 Hypertensive heart disease with heart failure: Secondary | ICD-10-CM | POA: Diagnosis present

## 2024-08-21 LAB — URINALYSIS, W/ REFLEX TO CULTURE (INFECTION SUSPECTED)
Bacteria, UA: NONE SEEN
Bilirubin Urine: NEGATIVE
Glucose, UA: NEGATIVE mg/dL
Hgb urine dipstick: NEGATIVE
Ketones, ur: NEGATIVE mg/dL
Leukocytes,Ua: NEGATIVE
Nitrite: NEGATIVE
Protein, ur: NEGATIVE mg/dL
Specific Gravity, Urine: 1.004 — ABNORMAL LOW (ref 1.005–1.030)
pH: 7 (ref 5.0–8.0)

## 2024-08-21 LAB — CBC
HCT: 40.1 % (ref 36.0–46.0)
Hemoglobin: 14 g/dL (ref 12.0–15.0)
MCH: 32.3 pg (ref 26.0–34.0)
MCHC: 34.9 g/dL (ref 30.0–36.0)
MCV: 92.4 fL (ref 80.0–100.0)
Platelets: 245 K/uL (ref 150–400)
RBC: 4.34 MIL/uL (ref 3.87–5.11)
RDW: 11.8 % (ref 11.5–15.5)
WBC: 6.9 K/uL (ref 4.0–10.5)
nRBC: 0 % (ref 0.0–0.2)

## 2024-08-21 LAB — COMPREHENSIVE METABOLIC PANEL WITH GFR
ALT: 20 U/L (ref 0–44)
AST: 28 U/L (ref 15–41)
Albumin: 4.5 g/dL (ref 3.5–5.0)
Alkaline Phosphatase: 78 U/L (ref 38–126)
Anion gap: 15 (ref 5–15)
BUN: 19 mg/dL (ref 8–23)
CO2: 25 mmol/L (ref 22–32)
Calcium: 9.8 mg/dL (ref 8.9–10.3)
Chloride: 102 mmol/L (ref 98–111)
Creatinine, Ser: 0.61 mg/dL (ref 0.44–1.00)
GFR, Estimated: >60 mL/min (ref >60)
Glucose, Bld: 115 mg/dL — ABNORMAL HIGH (ref 70–99)
Potassium: 4 mmol/L (ref 3.5–5.1)
Sodium: 141 mmol/L (ref 135–145)
Total Bilirubin: 0.3 mg/dL (ref 0.0–1.2)
Total Protein: 7.1 g/dL (ref 6.5–8.1)

## 2024-08-21 LAB — TROPONIN T, HIGH SENSITIVITY: Troponin T High Sensitivity: <15 ng/L (ref 0–19)

## 2024-08-21 MED ORDER — SODIUM CHLORIDE 0.9 % IV BOLUS
500.0000 mL | Freq: Once | INTRAVENOUS | Status: AC
  Administered 2024-08-21: 500 mL via INTRAVENOUS

## 2024-08-21 NOTE — ED Notes (Signed)
 Provider informed the patients BP is high, and is more altered.

## 2024-08-21 NOTE — ED Triage Notes (Signed)
 As per EMS the patients family called EMS because she believed her mother was alter after talking to her on the phone.

## 2024-08-21 NOTE — ED Provider Notes (Signed)
 [facility] Provider Note    Event Date/Time   First MD Initiated Contact with Patient 08/21/24 2257     (approximate)   History   No chief complaint on file.   HPI  Wendy Love is a 83 y.o. female who comes in with concerns with altered mental status.  Family they report that they talk to her around 5 PM and she was acting her normal self over the phone.  They reported that when the brother called later that night they noted that she was acting a little confused.  She was alert and oriented but just talking about a program that did not really have anything to do with what the conversation was about.  They report that at baseline she lives by herself she is very sharp.  They deny any new medication changes she denies any pain.  She does report understanding that she is here due to concerns for confusion.   Physical Exam   Triage Vital Signs: ED Triage Vitals  Encounter Vitals Group     BP 08/21/24 2233 (!) 188/107     Girls Systolic BP Percentile --      Girls Diastolic BP Percentile --      Boys Systolic BP Percentile --      Boys Diastolic BP Percentile --      Pulse Rate 08/21/24 2233 100     Resp 08/21/24 2233 18     Temp 08/21/24 2233 98.1 F (36.7 C)     Temp Source 08/21/24 2233 Oral     SpO2 08/21/24 2233 98 %     Weight --      Height --      Head Circumference --      Peak Flow --      Pain Score 08/21/24 2239 0     Pain Loc --      Pain Education --      Exclude from Growth Chart --     Most recent vital signs: Vitals:   08/21/24 2233 08/21/24 2256  BP: (!) 188/107   Pulse: 100   Resp: 18   Temp: 98.1 F (36.7 C)   SpO2: 98% 98%     General: Awake, no distress.  CV:  Good peripheral perfusion.  Resp:  Normal effort.  Abd:  No distention.  Soft and nontender Other:  Patient moving her legs frequently.  But good strength in both legs sensation and strength intact in arms.  No obvious cranial nerve  deficits.   ED Results / Procedures / Treatments   Labs (all labs ordered are listed, but only abnormal results are displayed) Labs Reviewed  CBC  COMPREHENSIVE METABOLIC PANEL WITH GFR  URINALYSIS, W/ REFLEX TO CULTURE (INFECTION SUSPECTED)  TROPONIN T, HIGH SENSITIVITY     EKG  My interpretation of EKG:  EKG was somewhat difficult to obtain secondary to patient's restlessness but heart rate was 112 without any ST elevation or T wave inversions, normal intervals  RADIOLOGY I have reviewed the xray personally and interpreted no PNA    PROCEDURES:  Critical Care performed: No  Procedures   MEDICATIONS ORDERED IN ED: Medications  sodium chloride  0.9 % bolus 500 mL (has no administration in time range)     IMPRESSION / MDM / ASSESSMENT AND PLAN / ED COURSE  I reviewed the triage vital signs and the nursing notes.   Patient's presentation is most consistent with acute presentation with potential threat to life or  bodily function.   Patient comes in tachycardic, hypertension with concerns for confusion.  Patient out of the window for stroke code as her last known well was 5 PM 08/21/2024.  No evidence of LVO based upon examination.  Patient does have some mild confusion.  Patient is hypertensive, tachycardic will give some IV fluid.  Patient getting blood work to evaluate for dehydration, electro abnormalities will add on thyroid  testing, urine evaluate for UTI.   IMPRESSION: 1. No acute intracranial abnormality. 2. Remote left cerebellar infarcts.  X-ray was negative  The patient is on the cardiac monitor to evaluate for evidence of arrhythmia and/or significant heart rate changes.      FINAL CLINICAL IMPRESSION(S) / ED DIAGNOSES   Final diagnoses:  None     Rx / DC Orders   ED Discharge Orders     None        Note:  This document was prepared using Dragon voice recognition software and may include unintentional dictation errors.

## 2024-08-22 ENCOUNTER — Observation Stay
Admit: 2024-08-22 | Discharge: 2024-08-22 | Disposition: A | Discharge status: Home / Self Care | Attending: Internal Medicine | Admitting: Internal Medicine

## 2024-08-22 ENCOUNTER — Observation Stay

## 2024-08-22 DIAGNOSIS — E039 Hypothyroidism, unspecified: Secondary | ICD-10-CM | POA: Diagnosis present

## 2024-08-22 DIAGNOSIS — I160 Hypertensive urgency: Secondary | ICD-10-CM | POA: Diagnosis not present

## 2024-08-22 DIAGNOSIS — I5032 Chronic diastolic (congestive) heart failure: Secondary | ICD-10-CM | POA: Diagnosis present

## 2024-08-22 DIAGNOSIS — I2510 Atherosclerotic heart disease of native coronary artery without angina pectoris: Secondary | ICD-10-CM | POA: Diagnosis present

## 2024-08-22 DIAGNOSIS — I639 Cerebral infarction, unspecified: Secondary | ICD-10-CM | POA: Diagnosis not present

## 2024-08-22 DIAGNOSIS — Z7982 Long term (current) use of aspirin: Secondary | ICD-10-CM | POA: Diagnosis not present

## 2024-08-22 DIAGNOSIS — I773 Arterial fibromuscular dysplasia: Secondary | ICD-10-CM | POA: Diagnosis present

## 2024-08-22 DIAGNOSIS — Z79890 Hormone replacement therapy (postmenopausal): Secondary | ICD-10-CM | POA: Diagnosis not present

## 2024-08-22 DIAGNOSIS — Z90710 Acquired absence of both cervix and uterus: Secondary | ICD-10-CM | POA: Diagnosis not present

## 2024-08-22 DIAGNOSIS — E034 Atrophy of thyroid (acquired): Secondary | ICD-10-CM | POA: Diagnosis not present

## 2024-08-22 DIAGNOSIS — I161 Hypertensive emergency: Principal | ICD-10-CM | POA: Diagnosis present

## 2024-08-22 DIAGNOSIS — E7800 Pure hypercholesterolemia, unspecified: Secondary | ICD-10-CM | POA: Diagnosis present

## 2024-08-22 DIAGNOSIS — I150 Renovascular hypertension: Secondary | ICD-10-CM | POA: Diagnosis not present

## 2024-08-22 DIAGNOSIS — E785 Hyperlipidemia, unspecified: Secondary | ICD-10-CM | POA: Diagnosis not present

## 2024-08-22 DIAGNOSIS — Z8041 Family history of malignant neoplasm of ovary: Secondary | ICD-10-CM | POA: Diagnosis not present

## 2024-08-22 DIAGNOSIS — I6389 Other cerebral infarction: Secondary | ICD-10-CM

## 2024-08-22 DIAGNOSIS — R4701 Aphasia: Secondary | ICD-10-CM | POA: Diagnosis present

## 2024-08-22 DIAGNOSIS — I110 Hypertensive heart disease with heart failure: Secondary | ICD-10-CM | POA: Diagnosis not present

## 2024-08-22 DIAGNOSIS — G4700 Insomnia, unspecified: Secondary | ICD-10-CM | POA: Diagnosis present

## 2024-08-22 DIAGNOSIS — I5023 Acute on chronic systolic (congestive) heart failure: Secondary | ICD-10-CM | POA: Diagnosis not present

## 2024-08-22 DIAGNOSIS — Z803 Family history of malignant neoplasm of breast: Secondary | ICD-10-CM | POA: Diagnosis not present

## 2024-08-22 DIAGNOSIS — Z8249 Family history of ischemic heart disease and other diseases of the circulatory system: Secondary | ICD-10-CM | POA: Diagnosis not present

## 2024-08-22 DIAGNOSIS — Z1152 Encounter for screening for COVID-19: Secondary | ICD-10-CM | POA: Diagnosis not present

## 2024-08-22 DIAGNOSIS — I6381 Other cerebral infarction due to occlusion or stenosis of small artery: Secondary | ICD-10-CM | POA: Diagnosis not present

## 2024-08-22 LAB — LIPID PANEL
Cholesterol: 220 mg/dL — ABNORMAL HIGH (ref 0–200)
HDL: 84 mg/dL (ref >40)
LDL Cholesterol: 123 mg/dL — ABNORMAL HIGH (ref 0–99)
Total CHOL/HDL Ratio: 2.6 ratio
Triglycerides: 66 mg/dL (ref <150)
VLDL: 13 mg/dL (ref 0–40)

## 2024-08-22 LAB — CBC
HCT: 37.5 % (ref 36.0–46.0)
Hemoglobin: 13.1 g/dL (ref 12.0–15.0)
MCH: 32 pg (ref 26.0–34.0)
MCHC: 34.9 g/dL (ref 30.0–36.0)
MCV: 91.5 fL (ref 80.0–100.0)
Platelets: 219 K/uL (ref 150–400)
RBC: 4.1 MIL/uL (ref 3.87–5.11)
RDW: 11.8 % (ref 11.5–15.5)
WBC: 5.6 K/uL (ref 4.0–10.5)
nRBC: 0 % (ref 0.0–0.2)

## 2024-08-22 LAB — ECHOCARDIOGRAM COMPLETE
AR max vel: 1.65 cm2
AV Area VTI: 1.98 cm2
AV Area mean vel: 1.68 cm2
AV Mean grad: 2 mmHg
AV Peak grad: 4.8 mmHg
Ao pk vel: 1.1 m/s
Calc EF: 58.3 %
Height: 62 in
MV VTI: 1.8 cm2
S' Lateral: 2.8 cm
Single Plane A2C EF: 56.4 %
Single Plane A4C EF: 60.3 %
Weight: 2048 [oz_av]

## 2024-08-22 LAB — TSH: TSH: 2.92 u[IU]/mL (ref 0.350–4.500)

## 2024-08-22 LAB — BASIC METABOLIC PANEL WITH GFR
Anion gap: 10 (ref 5–15)
BUN: 12 mg/dL (ref 8–23)
CO2: 24 mmol/L (ref 22–32)
Calcium: 9.3 mg/dL (ref 8.9–10.3)
Chloride: 105 mmol/L (ref 98–111)
Creatinine, Ser: 0.58 mg/dL (ref 0.44–1.00)
GFR, Estimated: >60 mL/min (ref >60)
Glucose, Bld: 99 mg/dL (ref 70–99)
Potassium: 3.8 mmol/L (ref 3.5–5.1)
Sodium: 139 mmol/L (ref 135–145)

## 2024-08-22 LAB — RESP PANEL BY RT-PCR (RSV, FLU A&B, COVID)  RVPGX2
Influenza A by PCR: NEGATIVE
Influenza B by PCR: NEGATIVE
Resp Syncytial Virus by PCR: NEGATIVE
SARS Coronavirus 2 by RT PCR: NEGATIVE

## 2024-08-22 LAB — PROTIME-INR
INR: 1.1 (ref 0.8–1.2)
Prothrombin Time: 15 s (ref 11.4–15.2)

## 2024-08-22 LAB — APTT: aPTT: 40 s — ABNORMAL HIGH (ref 24–36)

## 2024-08-22 LAB — PRO BRAIN NATRIURETIC PEPTIDE: Pro Brain Natriuretic Peptide: 113 pg/mL (ref <300.0)

## 2024-08-22 LAB — T4, FREE: Free T4: 1.11 ng/dL (ref 0.61–1.12)

## 2024-08-22 LAB — HEMOGLOBIN A1C
Hgb A1c MFr Bld: 4.8 % (ref 4.8–5.6)
Mean Plasma Glucose: 91.06 mg/dL

## 2024-08-22 MED ORDER — LABETALOL HCL 5 MG/ML IV SOLN: 5.0000 mg | INTRAVENOUS | Status: DC | PRN

## 2024-08-22 MED ORDER — HYDRALAZINE HCL 20 MG/ML IJ SOLN: 5.0000 mg | INTRAMUSCULAR | Status: DC | PRN

## 2024-08-22 MED ORDER — ENOXAPARIN SODIUM 40 MG/0.4ML IJ SOSY
40.0000 mg | PREFILLED_SYRINGE | INTRAMUSCULAR | Status: DC
  Administered 2024-08-22 – 2024-08-25 (×4): 40 mg via SUBCUTANEOUS
  Filled 2024-08-22 (×4): qty 0.4

## 2024-08-22 MED ORDER — ACETAMINOPHEN 325 MG PO TABS: 650.0000 mg | ORAL_TABLET | Freq: Four times a day (QID) | ORAL | Status: DC | PRN

## 2024-08-22 MED ORDER — ONDANSETRON HCL 4 MG/2ML IJ SOLN: 4.0000 mg | Freq: Three times a day (TID) | INTRAMUSCULAR | Status: DC | PRN

## 2024-08-22 MED ORDER — GADOBUTROL 1 MMOL/ML IV SOLN
6.0000 mL | Freq: Once | INTRAVENOUS | Status: AC | PRN
  Administered 2024-08-22: 6 mL via INTRAVENOUS

## 2024-08-22 MED ORDER — CARVEDILOL 3.125 MG PO TABS: 3.1250 mg | ORAL_TABLET | Freq: Two times a day (BID) | ORAL | Status: DC

## 2024-08-22 MED ORDER — IOHEXOL 350 MG/ML SOLN
75.0000 mL | Freq: Once | INTRAVENOUS | Status: AC | PRN
  Administered 2024-08-22: 75 mL via INTRAVENOUS

## 2024-08-22 MED ORDER — LEVOTHYROXINE SODIUM 50 MCG PO TABS
75.0000 ug | ORAL_TABLET | Freq: Every day | ORAL | Status: DC
  Administered 2024-08-22 – 2024-08-25 (×4): 75 ug via ORAL
  Filled 2024-08-22 (×4): qty 1

## 2024-08-22 MED ORDER — MELATONIN 5 MG PO TABS
2.5000 mg | ORAL_TABLET | Freq: Every day | ORAL | Status: DC
  Administered 2024-08-22 – 2024-08-24 (×4): 2.5 mg via ORAL
  Filled 2024-08-22 (×5): qty 1

## 2024-08-22 MED ORDER — LABETALOL HCL 5 MG/ML IV SOLN
10.0000 mg | Freq: Once | INTRAVENOUS | Status: DC
  Filled 2024-08-22: qty 4

## 2024-08-22 MED ORDER — ASPIRIN 81 MG PO TBEC: 81.0000 mg | DELAYED_RELEASE_TABLET | Freq: Every day | ORAL | Status: DC

## 2024-08-22 MED ORDER — ASPIRIN 325 MG PO TABS
325.0000 mg | ORAL_TABLET | Freq: Every day | ORAL | Status: DC
  Administered 2024-08-22 – 2024-08-25 (×4): 325 mg via ORAL
  Filled 2024-08-22 (×4): qty 1

## 2024-08-22 MED ORDER — IRBESARTAN 150 MG PO TABS: 150.0000 mg | ORAL_TABLET | Freq: Every day | ORAL | Status: DC

## 2024-08-22 MED ORDER — ROSUVASTATIN CALCIUM 10 MG PO TABS
20.0000 mg | ORAL_TABLET | Freq: Every day | ORAL | Status: DC
  Administered 2024-08-22 – 2024-08-25 (×4): 20 mg via ORAL
  Filled 2024-08-22 (×4): qty 2

## 2024-08-22 MED ORDER — ROSUVASTATIN CALCIUM 10 MG PO TABS: 20.0000 mg | ORAL_TABLET | Freq: Every day | ORAL | Status: DC

## 2024-08-22 MED ORDER — AMLODIPINE BESYLATE 5 MG PO TABS: 5.0000 mg | ORAL_TABLET | Freq: Every day | ORAL | Status: DC

## 2024-08-22 MED ORDER — LABETALOL HCL 5 MG/ML IV SOLN: 5.0000 mg | Freq: Once | INTRAVENOUS | Status: DC | PRN

## 2024-08-22 MED ORDER — STROKE: EARLY STAGES OF RECOVERY BOOK: Freq: Once | Status: DC

## 2024-08-22 MED ORDER — ASPIRIN 300 MG RE SUPP: 300.0000 mg | Freq: Every day | RECTAL | Status: DC

## 2024-08-22 NOTE — Consult Note (Signed)
 Reason for Consult:Acute infarct Requesting Physician: Dezii  CC: Confusion  I have been asked by Dr. Leesa to see this patient in consultation for acute infarct.  HPI: Wendy Love is an 83 y.o. female with medical history significant of whitecoat syndrome, fibromuscular dysplasia, renovascular hypertension, hypothyroidism, hyperlipidemia, chronic diastolic CHF, insomnia, who presents with confusion.  Patient lives at home alone.  Was on the phone with family at 5p and was at baseline.  When she was talked to later was noted to be confused.  Patient was brought in for evaluation.  Patient presented outside time window for thrombolytics.  No target lesion identified therefore patient not a thrombectomy candidate.  LKW 1700 on 08/21/2024  Past Medical History:  Diagnosis Date   Anxiety    Bronchitis    Cancer (HCC)    squamous cell   Claudication    a. 11/2021 ABIs: R 1.09, L 1.01.   Coronary artery calcification seen on CT scan    a. 11/2005 Cor CTA: Nl cors. Ca2+ = 0.  EF 56%; b. 05/2023 Cardiac CT: Ca2+ = 63.3 (36th%'ile).   COVID-19 09/01/21   home test    Degenerative disc disease, lumbar    Depression    Diastolic dysfunction    a. 02/2022 Echo: EF 55-60%, no rwma, mild LVH, GrI DD, nl RV size/fxn, RVSP 19.33mmHg, Ao sclerosis w/o stenosis.   Endometriosis 1974   s/p abdominal surgery, 2nd surgery   Fibromuscular dysplasia    Frequent epistaxis 04/13/2021   High cholesterol    Hypertension    Hypothyroidism    Rhinosinusitis    Right renal artery stenosis    a. 10/2021 s/p PTA/DCBA R renal artery; b. 11/2021 Renal duplex: 1-59% bilat RA stenoses; c. 02/2024 U/S: 1-59% bilat RA stenoses.   Sciatica     Past Surgical History:  Procedure Laterality Date   ABDOMINAL HYSTERECTOMY  09/09/1998   APPENDECTOMY     CARPAL TUNNEL RELEASE     bilateral   CATARACT EXTRACTION W/PHACO Left 07/26/2020   Procedure: CATARACT EXTRACTION PHACO AND INTRAOCULAR LENS PLACEMENT (IOC) LEFT  6.30 01:10.2 9.0%;  Surgeon: Mittie Gaskin, MD;  Location: [facility];  Service: Ophthalmology;  Laterality: Left;   CATARACT EXTRACTION W/PHACO Right 10/18/2020   Procedure: CATARACT EXTRACTION PHACO AND INTRAOCULAR LENS PLACEMENT (IOC) RIGHT 9.18 01:14.6 12.3%;  Surgeon: Mittie Gaskin, MD;  Location: [facility];  Service: Ophthalmology;  Laterality: Right;   dental implant  2023   Endoscopy/colonoscopy  12/09/2010   Schatski's Ring , small hiatal hernia   hemilaminectomy  12/08/2008   L3-L4hemi, L4-L5 microdiskectomy   KNEE ARTHROSCOPY WITH LATERAL MENISECTOMY Right 04/08/2018   Procedure: KNEE ARTHROSCOPY WITH PARTIAL LATERAL MENISECTOMY and debridement.;  Surgeon: Edie Norleen PARAS, MD;  Location: [facility];  Service: Orthopedics;  Laterality: Right;   REFRACTIVE SURGERY Right 06/2022   RENAL ANGIOGRAPHY N/A 10/25/2021   Procedure: RENAL ANGIOGRAPHY;  Surgeon: Marea Selinda RAMAN, MD;  Location: [facility];  Service: Cardiovascular;  Laterality: N/A;    Family History  Problem Relation Age of Onset   Ovarian cancer Mother    Cancer Mother 47       ovarian cancer, treated by [facility]   Heart disease Father    Cancer Sister 28       BREAST   Breast cancer Sister     Social History:  reports that she has never smoked. She has never used smokeless tobacco. She reports that she does not drink  alcohol and does not use drugs.  Allergies[1]  Medications: I have reviewed the patient's current medications. Prior to Admission:  Medications Prior to Admission  Medication Sig Dispense Refill Last Dose/Taking   amLODipine  (NORVASC ) 5 MG tablet TAKE 1 TABLET (5 MG TOTAL) BY MOUTH DAILY. 90 tablet 0 08/21/2024   ASPIRIN  LOW DOSE 81 MG tablet TAKE 1 TABLET BY MOUTH EVERY DAY 120 tablet 3 08/21/2024   b complex vitamins tablet Take 1 tablet by mouth daily.   08/21/2024   carvedilol  (COREG ) 3.125 MG tablet Take 1 tablet (3.125 mg total) by mouth 2 (two)  times daily with a meal. 180 tablet 3 08/21/2024 Morning   Cholecalciferol  (VITAMIN D3) 2000 UNITS TABS Take 5,000 Units by mouth daily.   Taking   Coenzyme Q10 (CO Q-10) 100 MG CAPS Take by mouth daily.   Taking   levothyroxine  (SYNTHROID ) 75 MCG tablet TAKE 1 TABLET BY MOUTH EVERY DAY 90 tablet 0 08/21/2024   Magnesium  300 MG CAPS Take by mouth daily.   Taking   melatonin 1 MG TABS tablet Take 2 mg by mouth at bedtime.   Taking   Multiple Vitamin (MULTIVITAMIN) tablet Take 1 tablet by mouth daily.   08/21/2024   Omega-3 Fatty Acids (FISH OIL PO) Take by mouth daily.   08/21/2024   PROBIOTIC CAPS Take by mouth daily.   08/21/2024   temazepam  (RESTORIL ) 15 MG capsule Take 1 capsule (15 mg total) by mouth at bedtime as needed for sleep. 30 capsule 2 Taking As Needed   Turmeric (QC TUMERIC COMPLEX PO) Take by mouth.   08/21/2024   valsartan  (DIOVAN ) 160 MG tablet TAKE 1 TABLET BY MOUTH TWICE A DAY 180 tablet 1 08/21/2024 Morning   vitamin C (ASCORBIC ACID) 500 MG tablet Take 1,000 mg by mouth daily.    08/21/2024   Zinc 50 MG TABS Take 50 mg by mouth daily.   08/21/2024   QUERCETIN PO Take 560 mg by mouth daily.      tretinoin (RETIN-A) 0.025 % cream APPLY TO WHOLE FACE EACH NIGHT. (Patient not taking: Reported on 08/22/2024)  5 Not Taking   Scheduled:  [START ON 08/23/2024]  stroke: early stages of recovery book   Does not apply Once   aspirin   300 mg Rectal Daily   Or   aspirin   325 mg Oral Daily   enoxaparin  (LOVENOX ) injection  40 mg Subcutaneous Q24H   levothyroxine   75 mcg Oral Q0600   melatonin  2.5 mg Oral QHS   rosuvastatin   20 mg Oral Daily    ROS: History obtained from the patient  General ROS: negative for - chills, fatigue, fever, night sweats, weight gain or weight loss Psychological ROS: negative for - behavioral disorder, hallucinations, memory difficulties, mood swings or suicidal ideation Ophthalmic ROS: negative for - blurry vision, double vision, eye pain or loss of  vision ENT ROS: negative for - epistaxis, nasal discharge, oral lesions, sore throat, tinnitus or vertigo Allergy and Immunology ROS: negative for - hives or itchy/watery eyes Hematological and Lymphatic ROS: negative for - bleeding problems, bruising or swollen lymph nodes Endocrine ROS: negative for - galactorrhea, hair pattern changes, polydipsia/polyuria or temperature intolerance Respiratory ROS: negative for - cough, hemoptysis, shortness of breath or wheezing Cardiovascular ROS: negative for - chest pain, dyspnea on exertion, edema or irregular heartbeat Gastrointestinal ROS: negative for - abdominal pain, diarrhea, hematemesis, nausea/vomiting or stool incontinence Genito-Urinary ROS: negative for - dysuria, hematuria, incontinence or urinary frequency/urgency Musculoskeletal ROS:  negative for - joint swelling or muscular weakness Neurological ROS: as noted in HPI Dermatological ROS: negative for rash and skin lesion changes   Physical Examination: Blood pressure (!) 147/62, pulse 64, temperature 98.4 F (36.9 C), resp. rate 18, height 5' 2 (1.575 m), weight 58.1 kg, SpO2 94%.  HEENT-  Normocephalic, no lesions, without obvious abnormality.  Normal external eye and conjunctiva.  Normal external ears. Normal external nose, mucus membranes and septum. Cardiovascular- Single S1, S2 Lungs- CTA Abdomen- soft, non-tender; bowel sounds normal; no masses,  no organomegaly Extremities- no edema Musculoskeletal-no joint tenderness, deformity or swelling Skin-warm and dry, no hyperpigmentation, vitiligo, or suspicious lesions  Neurological Examination   Mental Status: Alert, oriented.  Expressive aphasia exhibited by occasional word salad.  Able to name.  Able to perform simple calculations.  Able to follow 3 step commands without difficulty. Cranial Nerves: II: Visual fields grossly normal III,IV, VI: ptosis not present, extra-ocular motions intact bilaterally V,VII: smile symmetric,  facial light touch sensation normal bilaterally VIII: hearing normal bilaterally XI: bilateral shoulder shrug XII: midline tongue extension Motor: Right : Upper extremity   5/5    Left:     Upper extremity   5/5  Lower extremity   5/5     Lower extremity   5/5 Tone and bulk:normal tone throughout; no atrophy noted Sensory: Pinprick and light touch intact throughout, bilaterally Deep Tendon Reflexes: 2+ and symmetric with absent AJs bilaterally Plantars: Right: downgoing   Left: downgoing Cerebellar: normal finger-to-nose and normal heel-to-shin testing bilaterally Gait: not tested due to safety concerns     Laboratory Studies:   Basic Metabolic Panel: Recent Labs  Lab 08/21/24 2255 08/22/24 1119  NA 141 139  K 4.0 3.8  CL 102 105  CO2 25 24  GLUCOSE 115* 99  BUN 19 12  CREATININE 0.61 0.58  CALCIUM  9.8 9.3    Liver Function Tests: Recent Labs  Lab 08/21/24 2255  AST 28  ALT 20  ALKPHOS 78  BILITOT 0.3  PROT 7.1  ALBUMIN 4.5   No results for input(s): LIPASE, AMYLASE in the last 168 hours. No results for input(s): AMMONIA in the last 168 hours.  CBC: Recent Labs  Lab 08/21/24 2255 08/22/24 1119  WBC 6.9 5.6  HGB 14.0 13.1  HCT 40.1 37.5  MCV 92.4 91.5  PLT 245 219    Cardiac Enzymes: No results for input(s): CKTOTAL, CKMB, CKMBINDEX, TROPONINI in the last 168 hours.  BNP: Invalid input(s): POCBNP  CBG: No results for input(s): GLUCAP in the last 168 hours.  Microbiology: Results for orders placed or performed during the hospital encounter of 08/21/24  Resp panel by RT-PCR (RSV, Flu A&B, Covid) Anterior Nasal Swab     Status: None   Collection Time: 08/21/24 11:45 PM   Specimen: Anterior Nasal Swab  Result Value Ref Range Status   SARS Coronavirus 2 by RT PCR NEGATIVE NEGATIVE Final    Comment: (NOTE) SARS-CoV-2 target nucleic acids are NOT DETECTED.  The SARS-CoV-2 RNA is generally detectable in upper  respiratory specimens during the acute phase of infection. The lowest concentration of SARS-CoV-2 viral copies this assay can detect is 138 copies/mL. A negative result does not preclude SARS-Cov-2 infection and should not be used as the sole basis for treatment or other patient management decisions. A negative result may occur with  improper specimen collection/handling, submission of specimen other than nasopharyngeal swab, presence of viral mutation(s) within the areas targeted by this assay, and inadequate number  of viral copies(<138 copies/mL). A negative result must be combined with clinical observations, patient history, and epidemiological information. The expected result is Negative.  Fact Sheet for Patients:  bloggercourse.com  Fact Sheet for Healthcare Providers:  seriousbroker.it  This test is no t yet approved or cleared by the [place]  FDA and  has been authorized for detection and/or diagnosis of SARS-CoV-2 by FDA under an Emergency Use Authorization (EUA). This EUA will remain  in effect (meaning this test can be used) for the duration of the COVID-19 declaration under Section 564(b)(1) of the Act, 21 U.S.C.section 360bbb-3(b)(1), unless the authorization is terminated  or revoked sooner.       Influenza A by PCR NEGATIVE NEGATIVE Final   Influenza B by PCR NEGATIVE NEGATIVE Final    Comment: (NOTE) The Xpert Xpress SARS-CoV-2/FLU/RSV plus assay is intended as an aid in the diagnosis of influenza from Nasopharyngeal swab specimens and should not be used as a sole basis for treatment. Nasal washings and aspirates are unacceptable for Xpert Xpress SARS-CoV-2/FLU/RSV testing.  Fact Sheet for Patients: bloggercourse.com  Fact Sheet for Healthcare Providers: seriousbroker.it  This test is not yet approved or cleared by the [place]  FDA and has been  authorized for detection and/or diagnosis of SARS-CoV-2 by FDA under an Emergency Use Authorization (EUA). This EUA will remain in effect (meaning this test can be used) for the duration of the COVID-19 declaration under Section 564(b)(1) of the Act, 21 U.S.C. section 360bbb-3(b)(1), unless the authorization is terminated or revoked.     Resp Syncytial Virus by PCR NEGATIVE NEGATIVE Final    Comment: (NOTE) Fact Sheet for Patients: bloggercourse.com  Fact Sheet for Healthcare Providers: seriousbroker.it  This test is not yet approved or cleared by the [place]  FDA and has been authorized for detection and/or diagnosis of SARS-CoV-2 by FDA under an Emergency Use Authorization (EUA). This EUA will remain in effect (meaning this test can be used) for the duration of the COVID-19 declaration under Section 564(b)(1) of the Act, 21 U.S.C. section 360bbb-3(b)(1), unless the authorization is terminated or revoked.  Performed at [facility], [address] [place], [place] [address]     Coagulation Studies: Recent Labs    08/22/24 1119  LABPROT 15.0  INR 1.1    Urinalysis:  Recent Labs  Lab 08/21/24 2310  COLORURINE YELLOW*  LABSPEC 1.004*  PHURINE 7.0  GLUCOSEU NEGATIVE  HGBUR NEGATIVE  BILIRUBINUR NEGATIVE  KETONESUR NEGATIVE  PROTEINUR NEGATIVE  NITRITE NEGATIVE  LEUKOCYTESUR NEGATIVE    Lipid Panel:     Component Value Date/Time   CHOL 220 (H) 08/22/2024 1119   TRIG 66 08/22/2024 1119   HDL 84 08/22/2024 1119   CHOLHDL 2.6 08/22/2024 1119   VLDL 13 08/22/2024 1119   LDLCALC 123 (H) 08/22/2024 1119   LDLCALC 124 (H) 09/23/2018 0840    HgbA1C:  Lab Results  Component Value Date   HGBA1C 4.8 08/21/2024    Urine Drug Screen:  No results found for: LABOPIA, COCAINSCRNUR, LABBENZ, AMPHETMU, THCU, LABBARB  Alcohol Level: No results for input(s): ETH in the last 168  hours.  Other results: EKG: sinus tachycardia at 112 bpm.  Imaging: ECHOCARDIOGRAM COMPLETE Result Date: 08/22/2024    ECHOCARDIOGRAM REPORT   Patient Name:   LARYSSA HASSING O'REILLY Date of Exam: 08/22/2024 Medical Rec #:  [id number]            Height:       62.0 in Accession #:    [id number]  Weight:       128.0 lb Date of Birth:  21-Feb-1941            BSA:          1.581 m Patient Age:    83 years             BP:           144/69 mmHg Patient Gender: F                    HR:           65 bpm. Exam Location:  [facility] Procedure: 2D Echo, Cardiac Doppler and Color Doppler (Both Spectral and Color            Flow Doppler were utilized during procedure). Indications:     Stroke I63.9  History:         Patient has prior history of Echocardiogram examinations. Risk                  Factors:Hypertension. Cancer.  Sonographer:     Bari Roar Referring Phys:  [id number] XILIN NIU Diagnosing Phys: Shelda Bruckner MD IMPRESSIONS  1. Left ventricular ejection fraction, by estimation, is 55 to 60%. The left ventricle has normal function. The left ventricle has no regional wall motion abnormalities. There is mild concentric left ventricular hypertrophy. Left ventricular diastolic parameters are consistent with Grade I diastolic dysfunction (impaired relaxation).  2. Right ventricular systolic function is normal. The right ventricular size is normal. There is normal pulmonary artery systolic pressure.  3. The mitral valve is normal in structure. Trivial mitral valve regurgitation. No evidence of mitral stenosis.  4. The aortic valve is tricuspid. Aortic valve regurgitation is not visualized. No aortic stenosis is present.  5. The inferior vena cava is dilated in size with >50% respiratory variability, suggesting right atrial pressure of 8 mmHg. Comparison(s): No significant change from prior study. Conclusion(s)/Recommendation(s): Otherwise normal echocardiogram, with minor abnormalities described in the report.  FINDINGS  Left Ventricle: Left ventricular ejection fraction, by estimation, is 55 to 60%. The left ventricle has normal function. The left ventricle has no regional wall motion abnormalities. The left ventricular internal cavity size was normal in size. There is  mild concentric left ventricular hypertrophy. Left ventricular diastolic parameters are consistent with Grade I diastolic dysfunction (impaired relaxation). Right Ventricle: The right ventricular size is normal. No increase in right ventricular wall thickness. Right ventricular systolic function is normal. There is normal pulmonary artery systolic pressure. The tricuspid regurgitant velocity is 2.57 m/s, and  with an assumed right atrial pressure of 8 mmHg, the estimated right ventricular systolic pressure is 34.4 mmHg. Left Atrium: Left atrial size was normal in size. Right Atrium: Right atrial size was normal in size. Pericardium: There is no evidence of pericardial effusion. Mitral Valve: The mitral valve is normal in structure. Trivial mitral valve regurgitation. No evidence of mitral valve stenosis. MV peak gradient, 5.2 mmHg. The mean mitral valve gradient is 2.0 mmHg. Tricuspid Valve: The tricuspid valve is normal in structure. Tricuspid valve regurgitation is mild . No evidence of tricuspid stenosis. Aortic Valve: The aortic valve is tricuspid. Aortic valve regurgitation is not visualized. No aortic stenosis is present. Aortic valve mean gradient measures 2.0 mmHg. Aortic valve peak gradient measures 4.8 mmHg. Aortic valve area, by VTI measures 1.98 cm. Pulmonic Valve: The pulmonic valve was grossly normal. Pulmonic valve regurgitation is mild. No evidence of pulmonic stenosis. Aorta: The aortic root,  ascending aorta and aortic arch are all structurally normal, with no evidence of dilitation or obstruction. Venous: The inferior vena cava is dilated in size with greater than 50% respiratory variability, suggesting right atrial pressure of 8 mmHg.  IAS/Shunts: The atrial septum is grossly normal.  LEFT VENTRICLE PLAX 2D LVIDd:         4.00 cm     Diastology LVIDs:         2.80 cm     LV e' medial:    5.22 cm/s LV PW:         1.30 cm     LV E/e' medial:  12.4 LV IVS:        1.20 cm     LV e' lateral:   5.22 cm/s LVOT diam:     1.70 cm     LV E/e' lateral: 12.4 LV SV:         38 LV SV Index:   24 LVOT Area:     2.27 cm  LV Volumes (MOD) LV vol d, MOD A2C: 43.1 ml LV vol d, MOD A4C: 46.9 ml LV vol s, MOD A2C: 18.8 ml LV vol s, MOD A4C: 18.6 ml LV SV MOD A2C:     24.3 ml LV SV MOD A4C:     46.9 ml LV SV MOD BP:      26.9 ml RIGHT VENTRICLE RV Basal diam:  2.70 cm RV Mid diam:    2.30 cm RV S prime:     12.80 cm/s TAPSE (M-mode): 2.4 cm LEFT ATRIUM             Index        RIGHT ATRIUM          Index LA diam:        3.60 cm 2.28 cm/m   RA Area:     9.44 cm LA Vol (A2C):   31.7 ml 20.05 ml/m  RA Volume:   16.00 ml 10.12 ml/m LA Vol (A4C):   30.1 ml 19.03 ml/m LA Biplane Vol: 30.8 ml 19.48 ml/m  AORTIC VALVE                    PULMONIC VALVE AV Area (Vmax):    1.65 cm     PV Vmax:          1.07 m/s AV Area (Vmean):   1.68 cm     PV Peak grad:     4.6 mmHg AV Area (VTI):     1.98 cm     PR End Diast Vel: 7.29 msec AV Vmax:           110.00 cm/s  RVOT Peak grad:   2 mmHg AV Vmean:          70.600 cm/s AV VTI:            0.191 m AV Peak Grad:      4.8 mmHg AV Mean Grad:      2.0 mmHg LVOT Vmax:         79.80 cm/s LVOT Vmean:        52.200 cm/s LVOT VTI:          0.167 m LVOT/AV VTI ratio: 0.87  AORTA Ao Root diam: 2.20 cm Ao Asc diam:  2.50 cm MITRAL VALVE               TRICUSPID VALVE MV Area VTI:  1.80 cm     TR Peak grad:  26.4 mmHg MV Peak grad: 5.2 mmHg     TR Vmax:        257.00 cm/s MV Mean grad: 2.0 mmHg MV Vmax:      1.14 m/s     SHUNTS MV Vmean:     68.5 cm/s    Systemic VTI:  0.17 m MV E velocity: 64.50 cm/s  Systemic Diam: 1.70 cm MV A velocity: 99.00 cm/s MV E/A ratio:  0.65 MV A Prime:    10.0 cm/s Shelda Bruckner MD Electronically signed  by Shelda Bruckner MD Signature Date/Time: 08/22/2024/11:04:05 AM    Final    CT ANGIO HEAD NECK W WO CM Addendum Date: 08/22/2024  ADDENDUM #1      ADDENDUM: Salient findings relayed by telephone to Dr. Lawence at 5:41 AM on 08/22/2024. ---------------------------------------------------- Electronically signed by: Helayne Hurst MD 08/22/2024 05:50 AM EST RP Workstation: [id number]   Result Date: 08/22/2024  ORIGINAL REPORT      EXAM: CTA Head and Neck with Intravenous Contrast. CT Head without Contrast. CLINICAL HISTORY: 83 year old female with acute lacunar infarct of left corona radiata, basal ganglia. Stroke/TIA, determine embolic source. TECHNIQUE: Axial CTA images of the head and neck performed with intravenous contrast. MIP reconstructed images were created and reviewed. Axial computed tomography images of the head/brain performed without intravenous contrast. Note: Per PQRS, the description of internal carotid artery percent stenosis, including 0 percent or normal exam, is based on [place] American Symptomatic Carotid Endarterectomy Trial (NASCET) criteria. Dose reduction technique was used including one or more of the following: automated exposure control, adjustment of mA and kV according to patient size, and/or iterative reconstruction. CONTRAST: With and without. 75 mL (iohexol  (OMNIPAQUE ) 350 MG/ML injection 75 mL IOHEXOL  350 MG/ML SOLN). COMPARISON: Brain MRI 08/22/2024 01:16 AM. Head CT 08/21/2024. FINDINGS: CT HEAD: BRAIN: Acute lacunar type infarct of the left corona radiata, basal ganglia. Patchy bilateral white matter hypodensity is stable. The left corona radiata acute ischemia is occult by CT. No acute intracranial hemorrhage or mass effect. Patchy chronic bilateral cerebellar infarcts redemonstrated. No mass lesion. No CT evidence for acute territorial infarct. No midline shift or extra-axial collection. VENTRICLES: No hydrocephalus. ORBITS: The orbits are unremarkable. SINUSES AND  MASTOIDS: The paranasal sinuses and mastoid air cells are clear. CTA NECK: COMMON CAROTID ARTERIES: Tortuous right CCA. Left CCA origin calcified plaque without stenosis. Tortuous left CCA. No dissection or occlusion. INTERNAL CAROTID ARTERIES: Bulky calcified atherosclerosis at the right ICA origin. Proximal right ICA narrowed with estimated 68% stenosis. The right ICA remains patent and is highly tortuous distal to the bulb. Mildly tortuous right ICA siphon with mild to moderate calcified atherosclerosis but no siphon stenosis. Mild calcified plaque at the left carotid bifurcation without stenosis. Tortuous left ICA just below the skull base. Left ICA siphon moderate calcified plaque but no significant stenosis through the mid supraclinoid segment. However, Very Severe stenosis at the left ICA terminus, left MCA and ACA origins (series 15 image 21 and series 10 image 220. This more resembles atherosclerosis than thrombosis . VERTEBRAL ARTERIES: Normal right vertebral artery origin. Tortuous right V1 segment. Mildly tortuous right vertebral artery in the neck, and the right vertebral artery is mildly dominant throughout. Normal right vertebrobasilar junction. No right vertebral artery stenosis. Normal left vertebral artery origin. Tortuous left V1 segment. Tortuous mildly nondominant left vertebral artery with no stenosis to the left PICA origin. Left V4 segment mild stenosis to the vertebrobasilar junction. No dissection or occlusion. CTA HEAD: ANTERIOR CEREBRAL ARTERIES: Left  ACA origin is functionally occluded. This more resembles atherosclerosis than thrombosis. Reconstituted left ACA A2 enhancement is normal. Mild bilateral ACA branch irregularity. Normal anterior communicating artery. No aneurysm. MIDDLE CEREBRAL ARTERIES: Left MCA origin remains patent but is severely stenotic. This more resembles atherosclerosis than thrombosis. Left MCA M1 segment is mildly irregular. Patent left MCA bifurcation. No other  left MCA branch stenosis. Similar mild to moderate irregularity of the right MCA M1 segment. Mild right MCA M1 stenosis. Patent right MCA bifurcation. No significant right MCA branch stenosis. No aneurysm. POSTERIOR CEREBRAL ARTERIES: Patent SCA and PCA origins. Moderate bilateral PCA branch irregularity. Severe right PCA P2 segment stenosis. Distal right PCA branches remain patent. Moderate to severe similar left PCA P2 and P3 segment stenosis, also without occlusion. The right PCA P2 segment stenosis is greater than the left PCA P2 segment stenosis. This more resembles atherosclerosis than thrombosis. No aneurysm. BASILAR ARTERY: Patent basilar artery without stenosis. No aneurysm. OTHER: 3 vessel aortic arch configuration. Calcified aortic arch atherosclerosis. Brachiocephalic artery atherosclerosis without stenosis. Mild right subclavian origin atherosclerosis without stenosis. Proximal left subclavian artery atherosclerosis without stenosis. Major dural venous sinuses are enhancing and appear to be patent. SOFT TISSUES: Negative nonvascular neck soft tissue spaces. Negative visible upper chest. BONES: Advanced cervical spine degeneration superimposed on degenerative appearing ankylosis of C2-C3. Multilevel degenerative cervical spondylolisthesis. Underlying mild cervicothoracic scoliosis. No acute osseous abnormality. IMPRESSION: 1. Negative for ELVO, but Positive for Severe intracranial atherosclerosis with CRITICAL stenoses at the Left ICA terminus, left MCA origin, and right > than left PCA P2 segments. Functionally occluded Left ACA origin, but left A2 reconstituted by the Acomm. 2. Right ICA origin in the neck high grade atherosclerotic stenosis estimated at 68%. 3. Acute lacunar infarct involving the left corona radiata and basal ganglia occult by CT. No new intracranial abnormality. Electronically signed by: Helayne Hurst MD 08/22/2024 05:24 AM EST RP Workstation: [id number]   MR Brain W and Wo  Contrast Result Date: 08/22/2024 EXAM: MRI BRAIN WITH AND WITHOUT CONTRAST 08/22/2024 01:33:50 AM TECHNIQUE: Multiplanar multisequence MRI of the head/brain was performed with and without the administration of 6 mL of gadobutrol  (GADAVIST ) 1 MMOL/ML intravenous contrast. COMPARISON: None available. CLINICAL HISTORY: Mental status change, unknown cause. FINDINGS: BRAIN AND VENTRICLES: Small focus of acute ischemia at the left caudothalamic junction. Multifocal hyperintense T2-weighted signal within the cerebral white matter, most commonly due to chronic small vessel disease. Multiple cerebellar small vessel infarcts. No acute intracranial hemorrhage. No mass effect or midline shift. No hydrocephalus. The sella is unremarkable. Normal flow voids. No mass or abnormal enhancement. ORBITS: No acute abnormality. SINUSES: No acute abnormality. BONES AND SOFT TISSUES: Normal bone marrow signal and enhancement. No acute soft tissue abnormality. IMPRESSION: 1. Small focus of acute ischemia at the left caudothalamic junction. 2. Multiple chronic cerebellar small vessel infarcts. 3. Multifocal hyperintense T2-weighted signal within the cerebral white matter, most commonly due to chronic small vessel disease. Electronically signed by: Franky Stanford MD 08/22/2024 01:56 AM EST RP Workstation: [id number]   CT Head Wo Contrast Result Date: 08/21/2024 EXAM: CT HEAD WITHOUT 08/21/2024 11:27:00 PM TECHNIQUE: CT of the head was performed without the administration of intravenous contrast. Automated exposure control, iterative reconstruction, and/or weight based adjustment of the mA/kV was utilized to reduce the radiation dose to as low as reasonably achievable. COMPARISON: None available. CLINICAL HISTORY: confusion FINDINGS: BRAIN AND VENTRICLES: No acute intracranial hemorrhage. No mass effect or midline shift. No extra-axial fluid collection. No evidence of acute infarct.  No hydrocephalus. Remote left cerebellar infarcts. Global  cortical atrophy. Subcortical and periventricular small vessel ischemic changes. Intracranial atherosclerosis. ORBITS: No acute abnormality. SINUSES AND MASTOIDS: No acute abnormality. SOFT TISSUES AND SKULL: No acute skull fracture. No acute soft tissue abnormality. IMPRESSION: 1. No acute intracranial abnormality. 2. Remote left cerebellar infarcts. Electronically signed by: Pinkie Pebbles MD 08/21/2024 11:35 PM EST RP Workstation: [id number]   DG Chest Portable 1 View Result Date: 08/21/2024 EXAM: 1 VIEW(S) XRAY OF THE CHEST 08/21/2024 11:13:00 PM COMPARISON: Treatment 04/11/2221. CLINICAL HISTORY: weakness FINDINGS: LUNGS AND PLEURA: No focal pulmonary opacity. No pleural effusion. No pneumothorax. HEART AND MEDIASTINUM: Aortic atherosclerosis. No acute abnormality of the cardiac and mediastinal silhouettes. BONES AND SOFT TISSUES: Multilevel thoracic osteophytosis. Dextroconvex thoracolumbar scoliosis. IMPRESSION: 1. No acute cardiopulmonary process identified. Electronically signed by: Greig Pique MD 08/21/2024 11:16 PM EST RP Workstation: [id number]     Assessment/Plan: 83 y.o. female with medical history significant of whitecoat syndrome, fibromuscular dysplasia, renovascular hypertension, hypothyroidism, hyperlipidemia, chronic diastolic CHF, insomnia, who presents with subcortical aphasia.  MRI of the brain personally reviewed and reveals an acute left caudothalamic junction infarct.  Likely secondary to small vessel disease.  CTA of the head and neck reveals severe left ICA terminus, left MCA origin, and bilateral left PCA P2 stenosis.  The left ACA is functionally occluded.  Patient on ASA prior to admission.  A1c 4.8, LDL 123.  Has a history of myalgias on Lipitor.   Echocardiogram shows EF of 55-60% with no cardiac source of emboli identified.    Recommendations: 1. Patient with a history of myalgias on Lipitor.  Would consider a trial of Crestor , starting at a low dose to determine if  patient can tolerate.   2. PT consult, OT consult, Speech consult 3. Prophylactic therapy-Dual antiplatelet therapy with ASA 81mg  and Plavix  75mg  4. Telemetry monitoring 5. Frequent neuro checks 6. Agree with BP control 7. Vascular to be made aware of patient and to follow up.    No further neurologic intervention is recommended at this time.  If further questions arise, please call or page at that time.  Thank you for allowing neurology to participate in the care of this patient.  Case discussed with Dr. Leesa Sonny Hock, MD Neurology  08/22/2024  1:28 PM    Sonny Hock, MD Neurology  08/22/2024, 12:55 PM          [1]  Allergies Allergen Reactions   Atorvastatin  Other (See Comments)    myalgias   Brompheniramine-Pseudoeph     REACTION: agitation, nervous, sleepless   Codeine     REACTION: GI Upset   Diphenhydramine  Hcl     REACTION: nervous, sleepless, agitated   Hctz [Hydrochlorothiazide ] Other (See Comments)    Severe hyponatremia   Loratadine     REACTION: nervous, sleeplessness   Other Other (See Comments)    Tape left burn-type marks on face after Cataract surgery   Tape     Tape left burn-type marks on face after Cataract surgery   Triamterene      Hyperkalemia and hyponatremia   Tramadol  Hcl Rash    hyperactivity

## 2024-08-22 NOTE — Evaluation (Signed)
 Occupational Therapy Evaluation Patient Details Name: Wendy Love MRN: [id number] DOB: March 01, 1941 Today's Date: 08/22/2024   History of Present Illness   Wendy Love is a 83 y.o. female with medical history significant of whitecoat syndrome, ribromuscular dysplasia of renal artery, renovascular hypertension, Hypothyroidism, hyperlipidemia, chronic diastolic CHF, insomnia, who presents with confusion. MRI showed, mall focus of acute ischemia at the left caudothalamic junction, Multiple chronic cerebellar small vessel infarcts.     Clinical Impressions [name] was seen for OT evaluation this date. Prior to hospital admission, pt was active and independent in all aspects of ADL. Pt dtr at bedside states She could run circles around all of us . Typically pt lives alone in a 1 level home, with family living in [place]. Pt is active in her church community and has not had any falls in the last 6 months. Pt limited by cognitive/communication deficits this date. She is able to state her name with some difficulty/effort, but can not provide her DOB and is not oriented to place, time, or situation. Currently pt demonstrates significant impairments in cognition, communication, safety awareness, and activity tolerance. She requires SUPERVISION for safety for bed/functional mobility with increased time/effort to perform. MIN A for LB ADL management from STS. With consistent cueing for safety and sequencing t/o all functional tasks. Pt is eager to return to his PLOF with increased independence. Pt would benefit from skilled OT to address noted impairments and functional limitations (see below for any additional details) in order to maximize safety and independence while minimizing falls risk and caregiver burden. Anticipate the need for intensive rehabilitation services upon hospital DC (>/= 3 hours/day).       If plan is discharge home, recommend the following:   A little help with  walking and/or transfers;A little help with bathing/dressing/bathroom;Help with stairs or ramp for entrance;Supervision due to cognitive status;Assist for transportation;Assistance with cooking/housework;Direct supervision/assist for financial management;Direct supervision/assist for medications management     Functional Status Assessment   Patient has had a recent decline in their functional status and demonstrates the ability to make significant improvements in function in a reasonable and predictable amount of time.     Equipment Recommendations   Other (comment) (defer to next venue of care)     Recommendations for Other Services         Precautions/Restrictions   Precautions Precautions: Fall Recall of Precautions/Restrictions: Impaired Restrictions Weight Bearing Restrictions Per Provider Order: No     Mobility Bed Mobility Overal bed mobility: Needs Assistance Bed Mobility: Supine to Sit, Sit to Supine     Supine to sit: Supervision, Used rails, HOB elevated Sit to supine: Supervision, HOB elevated, Used rails        Transfers Overall transfer level: Needs assistance Equipment used: 1 person hand held assist Transfers: Sit to/from Stand Sit to Stand: Supervision                  Balance Overall balance assessment: Needs assistance Sitting-balance support: Feet supported, No upper extremity supported Sitting balance-Leahy Scale: Good     Standing balance support: During functional activity, No upper extremity supported Standing balance-Leahy Scale: Fair                             ADL either performed or assessed with clinical judgement   ADL Overall ADL's : Needs assistance/impaired  General ADL Comments: Supervision for safety with functional mobility, bed mobility, toilet transfer, and standing HH at sink. Pt limited by decreased cognition, decreased safety awareness, and  decreased awareness of deficits.     Vision Baseline Vision/History: 1 Wears glasses Ability to See in Adequate Light: 1 Impaired Patient Visual Report: No change from baseline Additional Comments: Denies acute changes, will continue to monitor     Perception         Praxis         Pertinent Vitals/Pain Pain Assessment Pain Assessment: No/denies pain     Extremity/Trunk Assessment Upper Extremity Assessment Upper Extremity Assessment: Right hand dominant (RUE appears marginally weaker than LUE this date, however, both are grossly WFL. 3+-4/5 t/o. Decreased FMC appreciated with RUE during finger to nose/sequential digit opposition.)   Lower Extremity Assessment Lower Extremity Assessment: Defer to PT evaluation;Overall WFL for tasks assessed       Communication Communication Communication: Impaired (expressive and receptive) Factors Affecting Communication: Difficulty expressing self;Reduced clarity of speech   Cognition Arousal: Alert Behavior During Therapy: WFL for tasks assessed/performed Cognition: Difficult to assess, Cognition impaired Difficult to assess due to: Impaired communication Orientation impairments: Place, Time, Situation (Able to state name, but not DOB.) Awareness: Intellectual awareness impaired, Online awareness impaired   Attention impairment (select first level of impairment): Selective attention, Alternating attention, Divided attention Executive functioning impairment (select all impairments): Problem solving, Reasoning, Initiation, Organization OT - Cognition Comments: Requires cues for initiation of functional tasks such as getting in/out of bed, walking to bathroom, etc. Is able to sequence automatic tasks (e.g. washing her hands at sink) but disregards safety instructions and/or cues intermittently. Per dtr, pt cognition worse from time of presentation, seems to be flucctuating.                 Following commands: Impaired Following  commands impaired: Follows one step commands inconsistently, Follows one step commands with increased time     Cueing  General Comments   Cueing Techniques: Verbal cues;Gestural cues      Exercises Other Exercises Other Exercises: Pt/caregiver educated on role of OT in acute setting, safety, falls prevention strategies, and DC recs.   Shoulder Instructions      Home Living Family/patient expects to be discharged to:: Private residence Living Arrangements: Alone Available Help at Discharge: Family;Available PRN/intermittently Type of Home: House Home Access: Stairs to enter     Home Layout: One level     Bathroom Shower/Tub: Tub/shower unit         Home Equipment: None          Prior Functioning/Environment Prior Level of Function : Independent/Modified Independent;Driving             Mobility Comments: Independent, no falls. ADLs Comments: Per dtr, Could run circles around us , very active and independent. Does not typically need any assistance for ADL/IADLs    OT Problem List: Decreased coordination;Decreased safety awareness;Impaired balance (sitting and/or standing);Decreased knowledge of use of DME or AE;Decreased cognition   OT Treatment/Interventions: Self-care/ADL training;Neuromuscular education;Therapeutic exercise;Cognitive remediation/compensation;DME and/or AE instruction;Patient/family education;Balance training;Therapeutic activities      OT Goals(Current goals can be found in the care plan section)   Acute Rehab OT Goals Patient Stated Goal: To return to PLOF OT Goal Formulation: With patient/family Time For Goal Achievement: 09/05/24 Potential to Achieve Goals: Good ADL Goals Pt Will Perform Grooming: standing;with modified independence Pt Will Perform Upper Body Dressing: standing;with modified independence Pt Will Perform Lower Body Dressing: sit  to/from stand;with modified independence Pt Will Transfer to Toilet: regular height  toilet;with modified independence;ambulating Pt Will Perform Toileting - Clothing Manipulation and hygiene: with modified independence;sit to/from stand;with adaptive equipment   OT Frequency:  Min 3X/week    Co-evaluation              AM-PAC OT 6 Clicks Daily Activity     Outcome Measure Help from another person eating meals?: A Little Help from another person taking care of personal grooming?: A Little Help from another person toileting, which includes using toliet, bedpan, or urinal?: A Little Help from another person bathing (including washing, rinsing, drying)?: A Little Help from another person to put on and taking off regular upper body clothing?: A Little Help from another person to put on and taking off regular lower body clothing?: A Little 6 Click Score: 18   End of Session Equipment Utilized During Treatment: Gait belt Nurse Communication: Mobility status;Other (comment) (DC recs)  Activity Tolerance: Patient tolerated treatment well Patient left: in bed;with call bell/phone within reach;with bed alarm set;with family/visitor present  OT Visit Diagnosis: Other abnormalities of gait and mobility (R26.89);Cognitive communication deficit (R41.841) Symptoms and signs involving cognitive functions: Cerebral infarction                Time: 9154-9093 OT Time Calculation (min): 21 min Charges:  OT General Charges $OT Visit: 1 Visit OT Evaluation $OT Eval Moderate Complexity: 1 Mod OT Treatments $Self Care/Home Management : 8-22 mins  Jhonny Pelton, M.S., OTR/L 08/22/2024, 11:06 AM

## 2024-08-22 NOTE — Evaluation (Addendum)
 Speech Language Pathology Evaluation Patient Details Name: Wendy Love MRN: [id number] DOB: 1941/03/25 Today's Date: 08/22/2024 Time: 0950-1002 SLP Time Calculation (min) (ACUTE ONLY): 12 min  Problem List:  Patient Active Problem List   Diagnosis Date Noted   Chronic diastolic CHF (congestive heart failure) (HCC) 08/22/2024   Hypertensive urgency 08/22/2024   Stroke (HCC) 08/22/2024   Coronary artery disease due to calcified coronary lesion 11/07/2023   Lump of skin of right upper extremity 12/23/2022   Carotid stenosis 02/12/2022   Renovascular hypertension 11/09/2021   Personal history of COVID-19 10/11/2020   Hemorrhoid prolapse 09/21/2019   Osteopenia after menopause 02/16/2019   Fibromuscular dysplasia of renal artery 08/18/2018   Synovial plica syndrome of right knee 04/10/2018   Complex tear of lateral meniscus of right knee as current injury 03/06/2018   Autosomal dominant hereditary hemochromatosis 12/31/2017   Myalgia due to statin 12/30/2017   Peripheral artery disease 07/03/2017   Tinnitus 11/16/2013   Encounter for preventive health examination 11/30/2012   Postmenopausal atrophic vaginitis 11/21/2012   Hyperlipidemia 03/21/2010   UNEQUAL LEG LENGTH 02/07/2010   INSOMNIA, CHRONIC 12/19/2009   TREMOR 12/19/2009   Right-sided low back pain with right-sided sciatica 09/27/2008   Hypothyroidism 10/30/2007   SYMPTOMATIC MENOPAUSAL/FEMALE CLIMACTERIC STATES 10/30/2007   White coat syndrome with diagnosis of hypertension 07/30/2007   Osteoarthritis of spine 07/30/2007   Past Medical History:  Past Medical History:  Diagnosis Date   Anxiety    Bronchitis    Cancer (HCC)    squamous cell   Claudication    a. 11/2021 ABIs: R 1.09, L 1.01.   Coronary artery calcification seen on CT scan    a. 11/2005 Cor CTA: Nl cors. Ca2+ = 0.  EF 56%; b. 05/2023 Cardiac CT: Ca2+ = 63.3 (36th%'ile).   COVID-19 09/01/21   home test    Degenerative disc disease, lumbar     Depression    Diastolic dysfunction    a. 02/2022 Echo: EF 55-60%, no rwma, mild LVH, GrI DD, nl RV size/fxn, RVSP 19.80mmHg, Ao sclerosis w/o stenosis.   Endometriosis 1974   s/p abdominal surgery, 2nd surgery   Fibromuscular dysplasia    Frequent epistaxis 04/13/2021   High cholesterol    Hypertension    Hypothyroidism    Rhinosinusitis    Right renal artery stenosis    a. 10/2021 s/p PTA/DCBA R renal artery; b. 11/2021 Renal duplex: 1-59% bilat RA stenoses; c. 02/2024 U/S: 1-59% bilat RA stenoses.   Sciatica    Past Surgical History:  Past Surgical History:  Procedure Laterality Date   ABDOMINAL HYSTERECTOMY  09/09/1998   APPENDECTOMY     CARPAL TUNNEL RELEASE     bilateral   CATARACT EXTRACTION W/PHACO Left 07/26/2020   Procedure: CATARACT EXTRACTION PHACO AND INTRAOCULAR LENS PLACEMENT (IOC) LEFT 6.30 01:10.2 9.0%;  Surgeon: Mittie Gaskin, MD;  Location: [facility];  Service: Ophthalmology;  Laterality: Left;   CATARACT EXTRACTION W/PHACO Right 10/18/2020   Procedure: CATARACT EXTRACTION PHACO AND INTRAOCULAR LENS PLACEMENT (IOC) RIGHT 9.18 01:14.6 12.3%;  Surgeon: Mittie Gaskin, MD;  Location: [facility];  Service: Ophthalmology;  Laterality: Right;   dental implant  2023   Endoscopy/colonoscopy  12/09/2010   Schatski's Ring , small hiatal hernia   hemilaminectomy  12/08/2008   L3-L4hemi, L4-L5 microdiskectomy   KNEE ARTHROSCOPY WITH LATERAL MENISECTOMY Right 04/08/2018   Procedure: KNEE ARTHROSCOPY WITH PARTIAL LATERAL MENISECTOMY and debridement.;  Surgeon: Edie Norleen PARAS, MD;  Location: [facility];  Service: Orthopedics;  Laterality: Right;   REFRACTIVE SURGERY Right 06/2022   RENAL ANGIOGRAPHY N/A 10/25/2021   Procedure: RENAL ANGIOGRAPHY;  Surgeon: Marea Selinda RAMAN, MD;  Location: [facility];  Service: Cardiovascular;  Laterality: N/A;   HPI:  Wendy Love is a 83 y.o. female with medical history significant of  whitecoat syndrome, ribromuscular dysplasia of renal artery, renovascular hypertension   Hypothyroidism, hyperlipidemia, chronic diastolic CHF, insomnia, who presents with confusion.  MRI 08/22/2024 1. Small focus of acute ischemia at the left caudothalamic junction. 2. Multiple chronic cerebellar small vessel infarcts. 3. Multifocal hyperintense T2-weighted signal within the cerebral white matter, most commonly due to chronic small vessel disease.   Assessment / Plan / Recommendation Clinical Impression  Pt with symptoms commensurate with caudate aphasia and is largely unable to communicate basic wants/needs as a result. She has fluent empty speech that is incoherent, good sentence structure but meaning is wrong, contextual misuse of words, semantic/ideational errors and difficulty with recall (President's name and that OT had been by and provided evaluation), no awareness of errors. Receptive language abilities and attention to task are strengthens.   Education provided on need for 24 hour supervision d/t severity of communication difficulties, recommend intense rehab at discharge from acute. ST to follow during this hospitalization.   Of note, pt was initially eating breakfast when this writer entered her room. She didn't display any s/s of oropharyngeal dysphagia but was observed with throat clearing after consumption. Pt's daughter reports previous esophageal dilations. Review of chart reveals hiatal hernia and reflux (2012). No further services from an oropharyngeal perspective.     SLP Assessment  SLP Recommendation/Assessment: Patient needs continued Speech Language Pathology Services SLP Visit Diagnosis: Aphasia (R47.01)     Assistance Recommended at Discharge  Full supervision  Functional Status Assessment Patient has had a recent decline in their functional status and demonstrates the ability to make significant improvements in function in a reasonable and predictable amount of  time.  Frequency and Duration min 2x/week  2 weeks      SLP Evaluation Cognition  Overall Cognitive Status: Difficult to assess Arousal/Alertness: Awake/alert Orientation Level: Oriented X4 (when provided with additional time) Awareness: Impaired Awareness Impairment: Intellectual impairment;Emergent impairment;Anticipatory impairment Safety/Judgment: Impaired       Comprehension  Auditory Comprehension Overall Auditory Comprehension: Appears within functional limits for tasks assessed Reading Comprehension Reading Status: Not tested    Expression Expression Primary Mode of Expression: Verbal Verbal Expression Overall Verbal Expression: Impaired Initiation: No impairment Automatic Speech: Name;Social Response Level of Generative/Spontaneous Verbalization: Sentence Repetition: No impairment Naming: Impairment Responsive: 51-75% accurate Confrontation: Impaired Convergent: 50-74% accurate Divergent: 50-74% accurate Verbal Errors: Not aware of errors Pragmatics: No impairment Non-Verbal Means of Communication: Not applicable Written Expression Dominant Hand: Right Written Expression: Not tested   Oral / Motor  Oral Motor/Sensory Function Overall Oral Motor/Sensory Function: Within functional limits Motor Speech Overall Motor Speech: Appears within functional limits for tasks assessed           Alazar Cherian B. Rubbie, M.S., CCC-SLP, Tree Surgeon Certified Brain Injury Specialist [facility]  [facility] Services Office [phone] Ascom [phone] Fax [phone]

## 2024-08-22 NOTE — Progress Notes (Signed)
 Inpatient Rehab Admissions Coordinator Note:   Per therapy patient was screened for CIR candidacy by Khyra Viscuso SHAUNNA Yvone Cohens, CCC-SLP. At this time, pt appears to be a potential candidate for CIR. I will place an order for rehab consult for full assessment, per our protocol.  Please contact me any with questions.SABRA Tinnie Yvone Cohens, MS, CCC-SLP Admissions Coordinator [phone] 08/22/2024 4:34 PM

## 2024-08-22 NOTE — H&P (Addendum)
 History and Physical    Wendy Love FMW:[id number] DOB: 1941-08-06 DOA: 08/21/2024  Referring MD/NP/PA:   PCP: Marylynn Verneita CROME, MD   Patient coming from:  The patient is coming from home.     Chief Complaint: confusion  HPI: Wendy Love is a 83 y.o. female with medical history significant of whitecoat syndrome, ribromuscular dysplasia of renal artery, renovascular hypertension   Hypothyroidism, hyperlipidemia, chronic diastolic CHF, insomnia, who presents with confusion.  Per her daughter at the bedside, patient was found to be confused at about 9 PM.  Per daughter, normally patient is mentally very sharp, orientated x 3.  Today she is confused, talking since that does not make sense.  When I saw patient in ED, patient is mildly confused, restless, but still orientated x 3.  She moves all extremities normally.  No unilateral numbness or tingling in extremities.  No facial droop or slurred speech.  Denies chest pain, cough, SOB.  No nausea, vomiting, diarrhea or abdominal pain.  No symptoms of UTI.  Patient was found to have elevated blood pressure 218/73, then improved to 195/106, then 172/80 without treatment.   Data reviewed independently and ED Course: pt was found to have WBC 6.9, GFR> 60, negative UA, troponin<15.  Temperature normal, heart rate 100, RR 21, oxygen saturation 98% on room air.  Chest x-ray negative.  CT of head negative for acute intracranial abnormalities but showed remote left cerebellar infarct.  Patient is placed in PCU for observation.  EKG: I have personally reviewed.  Sinus rhythm, QTc 458, poor R wave progression.   Review of Systems:   General: no fevers, chills, no body weight gain, fatigue HEENT: no blurry vision, hearing changes or sore throat Respiratory: no dyspnea, coughing, wheezing CV: no chest pain, no palpitations GI: no nausea, vomiting, abdominal pain, diarrhea, constipation GU: no dysuria, burning on urination, increased  urinary frequency, hematuria  Ext: no leg edema Neuro: no unilateral weakness, numbness, or tingling, no vision change or hearing loss. Has confusion, restless Skin: no rash, no skin tear. MSK: No muscle spasm, no deformity, no limitation of range of movement in spin Heme: No easy bruising.  Travel history: No recent long distant travel.   Allergy: Allergies[1]  Past Medical History:  Diagnosis Date   Anxiety    Bronchitis    Cancer (HCC)    squamous cell   Claudication    a. 11/2021 ABIs: R 1.09, L 1.01.   Coronary artery calcification seen on CT scan    a. 11/2005 Cor CTA: Nl cors. Ca2+ = 0.  EF 56%; b. 05/2023 Cardiac CT: Ca2+ = 63.3 (36th%'ile).   COVID-19 09/01/21   home test    Degenerative disc disease, lumbar    Depression    Diastolic dysfunction    a. 02/2022 Echo: EF 55-60%, no rwma, mild LVH, GrI DD, nl RV size/fxn, RVSP 19.48mmHg, Ao sclerosis w/o stenosis.   Endometriosis 1974   s/p abdominal surgery, 2nd surgery   Fibromuscular dysplasia    Frequent epistaxis 04/13/2021   High cholesterol    Hypertension    Hypothyroidism    Rhinosinusitis    Right renal artery stenosis    a. 10/2021 s/p PTA/DCBA R renal artery; b. 11/2021 Renal duplex: 1-59% bilat RA stenoses; c. 02/2024 U/S: 1-59% bilat RA stenoses.   Sciatica     Past Surgical History:  Procedure Laterality Date   ABDOMINAL HYSTERECTOMY  09/09/1998   APPENDECTOMY     CARPAL TUNNEL RELEASE  bilateral   CATARACT EXTRACTION W/PHACO Left 07/26/2020   Procedure: CATARACT EXTRACTION PHACO AND INTRAOCULAR LENS PLACEMENT (IOC) LEFT 6.30 01:10.2 9.0%;  Surgeon: Mittie Gaskin, MD;  Location: [facility];  Service: Ophthalmology;  Laterality: Left;   CATARACT EXTRACTION W/PHACO Right 10/18/2020   Procedure: CATARACT EXTRACTION PHACO AND INTRAOCULAR LENS PLACEMENT (IOC) RIGHT 9.18 01:14.6 12.3%;  Surgeon: Mittie Gaskin, MD;  Location: [facility];  Service: Ophthalmology;  Laterality:  Right;   dental implant  2023   Endoscopy/colonoscopy  12/09/2010   Schatski's Ring , small hiatal hernia   hemilaminectomy  12/08/2008   L3-L4hemi, L4-L5 microdiskectomy   KNEE ARTHROSCOPY WITH LATERAL MENISECTOMY Right 04/08/2018   Procedure: KNEE ARTHROSCOPY WITH PARTIAL LATERAL MENISECTOMY and debridement.;  Surgeon: Edie Norleen PARAS, MD;  Location: [facility];  Service: Orthopedics;  Laterality: Right;   REFRACTIVE SURGERY Right 06/2022   RENAL ANGIOGRAPHY N/A 10/25/2021   Procedure: RENAL ANGIOGRAPHY;  Surgeon: Marea Selinda RAMAN, MD;  Location: [facility];  Service: Cardiovascular;  Laterality: N/A;    Social History:  reports that she has never smoked. She has never used smokeless tobacco. She reports that she does not drink alcohol and does not use drugs.  Family History:  Family History  Problem Relation Age of Onset   Ovarian cancer Mother    Cancer Mother 39       ovarian cancer, treated by [facility]   Heart disease Father    Cancer Sister 77       BREAST   Breast cancer Sister      Prior to Admission medications  Medication Sig Start Date End Date Taking? Authorizing Provider  amLODipine  (NORVASC ) 5 MG tablet TAKE 1 TABLET (5 MG TOTAL) BY MOUTH DAILY. 07/19/24   Marylynn Verneita CROME, MD  ASPIRIN  LOW DOSE 81 MG tablet TAKE 1 TABLET BY MOUTH EVERY DAY 01/06/23   Brown, Fallon E, NP  b complex vitamins tablet Take 1 tablet by mouth daily.    [provider]  carvedilol  (COREG ) 3.125 MG tablet Take 1 tablet (3.125 mg total) by mouth 2 (two) times daily with a meal. 05/31/24   Marylynn Verneita CROME, MD  Cholecalciferol  (VITAMIN D3) 2000 UNITS TABS Take 5,000 Units by mouth daily.    [provider]  Coenzyme Q10 (CO Q-10) 100 MG CAPS Take by mouth daily.    [provider]  levothyroxine  (SYNTHROID ) 75 MCG tablet TAKE 1 TABLET BY MOUTH EVERY DAY 07/20/24   Marylynn Verneita CROME, MD  Magnesium  300 MG CAPS Take by mouth daily.    [provider]   melatonin 1 MG TABS tablet Take 2 mg by mouth at bedtime.    [provider]  Multiple Vitamin (MULTIVITAMIN) tablet Take 1 tablet by mouth daily.    [provider]  Omega-3 Fatty Acids (FISH OIL PO) Take by mouth daily.    [provider]  PROBIOTIC CAPS Take by mouth daily.    [provider]  QUERCETIN PO Take 560 mg by mouth daily.    [provider]  temazepam  (RESTORIL ) 15 MG capsule Take 1 capsule (15 mg total) by mouth at bedtime as needed for sleep. 03/15/24   Marylynn Verneita CROME, MD  tretinoin (RETIN-A) 0.025 % cream APPLY TO WHOLE FACE EACH NIGHT. 06/24/18   [provider]  Turmeric (QC TUMERIC COMPLEX PO) Take by mouth.    [provider]  valsartan  (DIOVAN ) 160 MG tablet TAKE 1 TABLET BY MOUTH TWICE  A DAY 07/27/24   Marylynn Verneita CROME, MD  vitamin C (ASCORBIC ACID) 500 MG tablet Take 1,000 mg by mouth daily.     [provider]  Zinc 50 MG TABS Take 50 mg by mouth daily.    [provider]    Physical Exam: Vitals:   08/22/24 0030 08/22/24 0100 08/22/24 0146 08/22/24 0207  BP: (!) 195/106 (!) 172/80 (!) 173/64 (!) 153/71  Pulse: 92 88 88 87  Resp:   16 17  Temp:   98.3 F (36.8 C) 97.8 F (36.6 C)  TempSrc:   Oral   SpO2: 98% 97% 99% 96%  Weight:   58.1 kg   Height:   5' 2 (1.575 m)    General: Not in acute distress HEENT:       Eyes: PERRL, EOMI, no jaundice       ENT: No discharge from the ears and nose, no pharynx injection, no tonsillar enlargement.        Neck: No JVD, no bruit, no mass felt. Heme: No neck lymph node enlargement. Cardiac: S1/S2, RRR, No murmurs, No gallops or rubs. Respiratory: No rales, wheezing, rhonchi or rubs. GI: Soft, nondistended, nontender, no rebound pain, no organomegaly, BS present. GU: No hematuria Ext: No pitting leg edema bilaterally. 1+DP/PT pulse bilaterally. Musculoskeletal: No joint deformities, No joint redness or warmth, no limitation of ROM in  spin. Skin: No rashes.  Neuro: Mildly confused, still oriented X3, cranial nerves II-XII grossly intact, moves all extremities normally. Muscle strength 5/5 in all extremities, sensation to light touch intact.  Psych: Patient is not psychotic, no suicidal or hemocidal ideation.  Labs on Admission: I have personally reviewed following labs and imaging studies  CBC: Recent Labs  Lab 08/21/24 2255  WBC 6.9  HGB 14.0  HCT 40.1  MCV 92.4  PLT 245   Basic Metabolic Panel: Recent Labs  Lab 08/21/24 2255  NA 141  K 4.0  CL 102  CO2 25  GLUCOSE 115*  BUN 19  CREATININE 0.61  CALCIUM  9.8   GFR: Estimated Creatinine Clearance: 41.4 mL/min (by C-G formula based on SCr of 0.61 mg/dL). Liver Function Tests: Recent Labs  Lab 08/21/24 2255  AST 28  ALT 20  ALKPHOS 78  BILITOT 0.3  PROT 7.1  ALBUMIN 4.5   No results for input(s): LIPASE, AMYLASE in the last 168 hours. No results for input(s): AMMONIA in the last 168 hours. Coagulation Profile: No results for input(s): INR, PROTIME in the last 168 hours. Cardiac Enzymes: No results for input(s): CKTOTAL, CKMB, CKMBINDEX, TROPONINI in the last 168 hours. BNP (last 3 results) Recent Labs    08/21/24 2345  PROBNP 113.0   HbA1C: No results for input(s): HGBA1C in the last 72 hours. CBG: No results for input(s): GLUCAP in the last 168 hours. Lipid Profile: No results for input(s): CHOL, HDL, LDLCALC, TRIG, CHOLHDL, LDLDIRECT in the last 72 hours. Thyroid  Function Tests: Recent Labs    08/21/24 2255  TSH 2.920  FREET4 1.11   Anemia Panel: No results for input(s): VITAMINB12, FOLATE, FERRITIN, TIBC, IRON, RETICCTPCT in the last 72 hours. Urine analysis:    Component Value Date/Time   COLORURINE YELLOW (A) 08/21/2024 2310   APPEARANCEUR CLEAR (A) 08/21/2024 2310   LABSPEC 1.004 (L) 08/21/2024 2310   PHURINE 7.0 08/21/2024 2310   GLUCOSEU NEGATIVE 08/21/2024 2310    GLUCOSEU NEGATIVE 09/20/2019 1602   HGBUR NEGATIVE 08/21/2024 2310   BILIRUBINUR NEGATIVE 08/21/2024 2310   BILIRUBINUR  neg 09/20/2019 1609   KETONESUR NEGATIVE 08/21/2024 2310   PROTEINUR NEGATIVE 08/21/2024 2310   UROBILINOGEN 0.2 09/20/2019 1609   UROBILINOGEN 0.2 09/20/2019 1602   NITRITE NEGATIVE 08/21/2024 2310   LEUKOCYTESUR NEGATIVE 08/21/2024 2310   Sepsis Labs: @LABRCNTIP (procalcitonin:4,lacticidven:4) ) Recent Results (from the past 240 hours)  Resp panel by RT-PCR (RSV, Flu A&B, Covid) Anterior Nasal Swab     Status: None   Collection Time: 08/21/24 11:45 PM   Specimen: Anterior Nasal Swab  Result Value Ref Range Status   SARS Coronavirus 2 by RT PCR NEGATIVE NEGATIVE Final    Comment: (NOTE) SARS-CoV-2 target nucleic acids are NOT DETECTED.  The SARS-CoV-2 RNA is generally detectable in upper respiratory specimens during the acute phase of infection. The lowest concentration of SARS-CoV-2 viral copies this assay can detect is 138 copies/mL. A negative result does not preclude SARS-Cov-2 infection and should not be used as the sole basis for treatment or other patient management decisions. A negative result may occur with  improper specimen collection/handling, submission of specimen other than nasopharyngeal swab, presence of viral mutation(s) within the areas targeted by this assay, and inadequate number of viral copies(<138 copies/mL). A negative result must be combined with clinical observations, patient history, and epidemiological information. The expected result is Negative.  Fact Sheet for Patients:  bloggercourse.com  Fact Sheet for Healthcare Providers:  seriousbroker.it  This test is no t yet approved or cleared by the [place]  FDA and  has been authorized for detection and/or diagnosis of SARS-CoV-2 by FDA under an Emergency Use Authorization (EUA). This EUA will remain  in effect (meaning this  test can be used) for the duration of the COVID-19 declaration under Section 564(b)(1) of the Act, 21 U.S.C.section 360bbb-3(b)(1), unless the authorization is terminated  or revoked sooner.       Influenza A by PCR NEGATIVE NEGATIVE Final   Influenza B by PCR NEGATIVE NEGATIVE Final    Comment: (NOTE) The Xpert Xpress SARS-CoV-2/FLU/RSV plus assay is intended as an aid in the diagnosis of influenza from Nasopharyngeal swab specimens and should not be used as a sole basis for treatment. Nasal washings and aspirates are unacceptable for Xpert Xpress SARS-CoV-2/FLU/RSV testing.  Fact Sheet for Patients: bloggercourse.com  Fact Sheet for Healthcare Providers: seriousbroker.it  This test is not yet approved or cleared by the [place]  FDA and has been authorized for detection and/or diagnosis of SARS-CoV-2 by FDA under an Emergency Use Authorization (EUA). This EUA will remain in effect (meaning this test can be used) for the duration of the COVID-19 declaration under Section 564(b)(1) of the Act, 21 U.S.C. section 360bbb-3(b)(1), unless the authorization is terminated or revoked.     Resp Syncytial Virus by PCR NEGATIVE NEGATIVE Final    Comment: (NOTE) Fact Sheet for Patients: bloggercourse.com  Fact Sheet for Healthcare Providers: seriousbroker.it  This test is not yet approved or cleared by the [place]  FDA and has been authorized for detection and/or diagnosis of SARS-CoV-2 by FDA under an Emergency Use Authorization (EUA). This EUA will remain in effect (meaning this test can be used) for the duration of the COVID-19 declaration under Section 564(b)(1) of the Act, 21 U.S.C. section 360bbb-3(b)(1), unless the authorization is terminated or revoked.  Performed at [facility], [address] [place], [place] [address]      Radiological Exams on  Admission:   Assessment/Plan Principal Problem:   Stroke [facility]) Active Problems:   Fibromuscular dysplasia of renal artery   Renovascular  hypertension   Hypertensive urgency   Hypothyroidism   Hyperlipidemia   Chronic diastolic CHF (congestive heart failure) (HCC)   INSOMNIA, CHRONIC   Assessment and Plan:   Hypertensive urgency: Bp 218/73, then improved to 195/106, then 172/80 without treatment.  Patient has history of whitecoat syndrome. - Place in PCU for observation - Continue home amlodipine , Coreg  - Switch Diovan  to irbesartan  - As needed labetalol  5 mg every 2 hours for SBP> 170, to lower blood pressure gradually.  Confusion: Patient has mild confusion, but still orientated x 3.  She talks something which does not make sense.  Etiology is not clear.  CT head negative for acute intracranial abnormalities.  Hypertensive encephalopathy is a potential differential diagnosis. Also need to rule out stroke. - Fall precaution - Frequent neurocheck - Follow-up MRI of brain with and without contrast which is ordered by EDP.  Addendum_stroke: MRI showed acute stroke, will do stroke work up. Pt is out of window for tPA now MRI-brain: 1. Small focus of acute ischemia at the left caudothalamic junction. 2. Multiple chronic cerebellar small vessel infarcts. 3. Multifocal hyperintense T2-weighted signal within the cerebral white matter, most commonly due to chronic small vessel disease.  - will change bed to tele bed for obs - will d/c Bp medications including amlodipine , Coreg , irbesartan  - Allow permissive hypertension - prn labetalol  for SBP> 220 or dBP> 110 - CTA of head and neck - start ASA  - Statin: start Crestor  20 mg daily - fasting lipid panel and HbA1c  - 2D transthoracic echocardiography  - swallowing screen. If fails, will get SLP - PT/OT consult    Fibromuscular dysplasia of renal artery and renovascular hypertension: pt is following up with Dr. Marea of vascular  surgery.  S/p of right renal artery stent placement. - Blood pressure control as above - Follow-up with vascular surgeon  Hypothyroidism -Synthroid   Hyperlipidemia: Patient is not taking medications currently. -Follow-up with PCP  Chronic diastolic CHF (congestive heart failure) (HCC): 2D echo on 02/26/2022 showed EF of 55-8% with grade 1 diastolic dysfunction.  No leg edema or JVD.  CHF is compensated. - Check BNP  INSOMNIA, CHRONIC - Continue home melatonin      DVT ppx: SQ Lovenox   Code Status: Full code  Family Communication:  yes, patient's daughter at bed side.       Disposition Plan:  Anticipate discharge back to previous environment  Consults called:  none  Admission status and Level of care: Telemetry:    for obs     Dispo: The patient is from: Home              Anticipated d/c is to: Home              Anticipated d/c date is: 1 day              Patient currently is not medically stable to d/c.    Severity of Illness:  The appropriate patient status for this patient is OBSERVATION. Observation status is judged to be reasonable and necessary in order to provide the required intensity of service to ensure the patient's safety. The patient's presenting symptoms, physical exam findings, and initial radiographic and laboratory data in the context of their medical condition is felt to place them at decreased risk for further clinical deterioration. Furthermore, it is anticipated that the patient will be medically stable for discharge from the hospital within 2 midnights of admission.        Date of  Service 08/22/2024    Caleb Exon [facility]   If 7PM-7AM, please contact night-coverage [email or web address] 08/22/2024, 2:17 AM     [1]  Allergies Allergen Reactions   Atorvastatin  Other (See Comments)    myalgias   Brompheniramine-Pseudoeph     REACTION: agitation, nervous, sleepless   Codeine     REACTION: GI Upset   Diphenhydramine  Hcl      REACTION: nervous, sleepless, agitated   Hctz [Hydrochlorothiazide ] Other (See Comments)    Severe hyponatremia   Loratadine     REACTION: nervous, sleeplessness   Other Other (See Comments)    Tape left burn-type marks on face after Cataract surgery   Tape     Tape left burn-type marks on face after Cataract surgery   Triamterene      Hyperkalemia and hyponatremia   Tramadol  Hcl Rash    hyperactivity

## 2024-08-22 NOTE — Progress Notes (Signed)
 Physical Therapy Evaluation Patient Details Name: Wendy Love MRN: [id number] DOB: Oct 31, 1940 Today's Date: 08/22/2024  History of Present Illness  Wendy Love is a 83 y.o. female with medical history significant of whitecoat syndrome, ribromuscular dysplasia of renal artery, renovascular hypertension, Hypothyroidism, hyperlipidemia, chronic diastolic CHF, insomnia, who presents with confusion. MRI showed, small focus of acute ischemia at the left caudothalamic junction, Multiple chronic cerebellar small vessel infarcts.   Clinical Impression  Pt presented supine in bed with HOB elevated, awake and willing to participate in therapy session. Pt's daughter was present throughout the evaluation, very attentive and caring to pt's needs. Prior to admission, pt's daughter reported that pt was extremely independent with all functional mobility and ADLs. She also stated that pt was very active, cognitively very sharp, still driving, attending church regularly and visiting with church members. Pt was very pleasant and eager to participate; however, she had a very difficult time with expressive speech and unable to answer questions appropriately. SLP completing eval just prior to PT entering room and reporting pt demonstrating word salad which was also prevalent throughout PT eval. Pt able to complete bed mobility and transfers with supervision for safety. She was also able to ambulate and participate in a higher level balance assessment with CGA. Pt was able to participate in a higher level balance assessment, the DGI, and scored very low at 15/24, indicating that she is at an increased risk for falls. As pt's current functional mobility and cognitive status is vastly different from her baseline, PT recommending further intensive rehabilitation services upon hospital DC (>/= 3 hours/day) to maximize her safety and independence with functional mobility prior to returning home. Pt and pt's  daughter were agreeable. PT will continue to follow-up with pt acutely to progress mobility as tolerated.      If plan is discharge home, recommend the following: A little help with bathing/dressing/bathroom;Assistance with cooking/housework;Direct supervision/assist for medications management;Direct supervision/assist for financial management;Assist for transportation;Help with stairs or ramp for entrance;Supervision due to cognitive status   Can travel by private vehicle        Equipment Recommendations None recommended by PT  Recommendations for Other Services  Rehab consult    Functional Status Assessment Patient has had a recent decline in their functional status and demonstrates the ability to make significant improvements in function in a reasonable and predictable amount of time.     Precautions / Restrictions Precautions Precautions: Fall Recall of Precautions/Restrictions: Impaired Restrictions Weight Bearing Restrictions Per Provider Order: No      Mobility  Bed Mobility Overal bed mobility: Needs Assistance Bed Mobility: Supine to Sit, Sit to Supine     Supine to sit: Supervision, Used rails, HOB elevated Sit to supine: Supervision, HOB elevated, Used rails        Transfers Overall transfer level: Needs assistance Equipment used: None Transfers: Sit to/from Stand Sit to Stand: Supervision                Ambulation/Gait Ambulation/Gait assistance: Contact guard assist Gait Distance (Feet): 100 Feet Assistive device: None Gait Pattern/deviations: Step-through pattern, Decreased stride length Gait velocity: decreased     General Gait Details: pt with some instability, particularly with higher level balance tasks; however, no overt LOB or need for external physical assistance  Stairs            Wheelchair Mobility     Tilt Bed    Modified Rankin (Stroke Patients Only) Modified Rankin (Stroke Patients Only) Pre-Morbid Rankin Score:  No  symptoms Modified Rankin: Moderate disability     Balance Overall balance assessment: Needs assistance Sitting-balance support: Feet supported, No upper extremity supported Sitting balance-Leahy Scale: Good     Standing balance support: During functional activity, No upper extremity supported Standing balance-Leahy Scale: Fair                   Standardized Balance Assessment Standardized Balance Assessment : Dynamic Gait Index   Dynamic Gait Index Level Surface: Normal Change in Gait Speed: Mild Impairment Gait with Horizontal Head Turns: Moderate Impairment Gait with Vertical Head Turns: Moderate Impairment Gait and Pivot Turn: Mild Impairment Step Over Obstacle: Mild Impairment Step Around Obstacles: Mild Impairment Steps: Mild Impairment Total Score: 15       Pertinent Vitals/Pain Pain Assessment Pain Assessment: No/denies pain    Home Living Family/patient expects to be discharged to:: Private residence Living Arrangements: Alone Available Help at Discharge: Family;Available PRN/intermittently Type of Home: House Home Access: Stairs to enter       Home Layout: One level Home Equipment: None      Prior Function Prior Level of Function : Independent/Modified Independent;Driving             Mobility Comments: Independent, no falls, very active. ADLs Comments: Per dtr, Could run circles around us , very active and independent. Does not typically need any assistance for ADL/IADLs     Extremity/Trunk Assessment   Upper Extremity Assessment Upper Extremity Assessment: Defer to OT evaluation    Lower Extremity Assessment Lower Extremity Assessment: Overall WFL for tasks assessed    Cervical / Trunk Assessment Cervical / Trunk Assessment: Normal  Communication   Communication Communication: Impaired Factors Affecting Communication: Difficulty expressing self;Reduced clarity of speech    Cognition Arousal: Alert Behavior During Therapy:  WFL for tasks assessed/performed   PT - Cognitive impairments: Safety/Judgement, Problem solving, Memory, Difficult to assess Difficult to assess due to: Impaired communication                     PT - Cognition Comments: pt presenting with difficulties with expressive speech, word salad per SLP; difficult to fully assess cognition due to expressive difficulties; however, pt appearing to have a difficult time with memory and recall Following commands: Impaired Following commands impaired: Follows one step commands inconsistently, Follows one step commands with increased time     Cueing Cueing Techniques: Verbal cues, Gestural cues     General Comments      Exercises     Assessment/Plan    PT Assessment Patient needs continued PT services  PT Problem List Decreased balance;Decreased mobility;Decreased coordination;Decreased cognition;Decreased safety awareness;Decreased knowledge of precautions       PT Treatment Interventions Gait training;Stair training;Functional mobility training;Therapeutic activities;Therapeutic exercise;Balance training;Neuromuscular re-education;Cognitive remediation;Patient/family education    PT Goals (Current goals can be found in the Care Plan section)  Acute Rehab PT Goals Patient Stated Goal: return to her PLOF as she was extremely independent PT Goal Formulation: With patient/family Time For Goal Achievement: 09/05/24 Potential to Achieve Goals: Good    Frequency Min 4X/week     Co-evaluation               AM-PAC PT 6 Clicks Mobility  Outcome Measure Help needed turning from your back to your side while in a flat bed without using bedrails?: None Help needed moving from lying on your back to sitting on the side of a flat bed without using bedrails?: None Help needed moving to  and from a bed to a chair (including a wheelchair)?: None Help needed standing up from a chair using your arms (e.g., wheelchair or bedside chair)?:  None Help needed to walk in hospital room?: A Little Help needed climbing 3-5 steps with a railing? : A Little 6 Click Score: 22    End of Session   Activity Tolerance: Patient tolerated treatment well Patient left: in bed;with call bell/phone within reach;with family/visitor present Nurse Communication: Mobility status PT Visit Diagnosis: Other abnormalities of gait and mobility (R26.89)    Time: 1000-1040 PT Time Calculation (min) (ACUTE ONLY): 40 min   Charges:   PT Evaluation $PT Eval Moderate Complexity: 1 Mod PT Treatments $Gait Training: 8-22 mins $Therapeutic Activity: 8-22 mins PT General Charges $$ ACUTE PT VISIT: 1 Visit         Delon DELENA KLEIN, DPT  Acute Rehabilitation Services Office [phone]   Delon HERO Makhia Vosler 08/22/2024, 11:27 AM

## 2024-08-22 NOTE — Progress Notes (Signed)
*  PRELIMINARY RESULTS* Echocardiogram 2D Echocardiogram has been performed.  Wendy Love Wendy Love 08/22/2024, 8:51 AM

## 2024-08-22 NOTE — Progress Notes (Signed)
° °  Interim no charge progress note   This patient, Wendy Love is a 83 y.o. female who was admitted by colleague earlier this morning with a new CVA. Work up thus far has revealed: CTA head and neck revealed severe left ICA terminus, left MCA origin, bilateral PCA P2 stenosis.  Left ACA is functionally occluded. Echo EF 55-60% with no source of cardiac emboli.  LDL 123. Hemoglobin A1c 4.8%, TSH WNL.  At the time my evaluation patient is still experiencing some word finding difficulty but does appear to be improving slightly compared to earlier this morning. I have had extensive discussions with her daughter who is at bedside.  We have discussed the expected prognosis and the importance of therapy as part of her recovery. PT/OT/SLP currently recommending inpatient rehab.  I think patient would do well in this setting. We will continue to monitor closely on telemetry.  Keep neurochecks.  Continue with permissive hypertension for another 24 hours. Continue with aspirin  and Plavix .  Start Crestor  at a low dose (does have history of myalgias on statin).      08/22/2024   12:07 PM 08/22/2024    8:16 AM 08/22/2024    5:45 AM  Vitals with BMI  Systolic 147 153 855  Diastolic 62 65 69  Pulse 64 65 74       Latest Ref Rng & Units 08/22/2024   11:19 AM 08/21/2024   10:55 PM 11/07/2023   12:23 PM  CBC  WBC 4.0 - 10.5 K/uL 5.6  6.9  5.4   Hemoglobin 12.0 - 15.0 g/dL 86.8  85.9  85.0   Hematocrit 36.0 - 46.0 % 37.5  40.1  43.5   Platelets 150 - 400 K/uL 219  245  261.0        Latest Ref Rng & Units 08/22/2024   11:19 AM 08/21/2024   10:55 PM 05/31/2024    3:06 PM  BMP  Glucose 70 - 99 mg/dL 99  884  891   BUN 8 - 23 mg/dL 12  19  16    Creatinine 0.44 - 1.00 mg/dL 9.41  9.38  9.38   Sodium 135 - 145 mmol/L 139  141  137   Potassium 3.5 - 5.1 mmol/L 3.8  4.0  4.2   Chloride 98 - 111 mmol/L 105  102  100   CO2 22 - 32 mmol/L 24  25  30    Calcium  8.9 - 10.3 mg/dL 9.3  9.8  9.6

## 2024-08-23 MED ORDER — AMLODIPINE BESYLATE 5 MG PO TABS
5.0000 mg | ORAL_TABLET | Freq: Every day | ORAL | Status: DC
  Administered 2024-08-23 – 2024-08-25 (×3): 5 mg via ORAL
  Filled 2024-08-23 (×3): qty 1

## 2024-08-23 NOTE — Progress Notes (Signed)
 Inpatient Rehab Admissions Coordinator:   Attempted to reach daughter by phone to discuss CIR recommendations.  Left message and await return call.   Reche Lowers, PT, DPT Admissions Coordinator [phone] 08/23/2024 3:29 PM

## 2024-08-23 NOTE — Progress Notes (Signed)
 Inpatient Rehab Coordinator Note:  I was able to speak with pt's daughter, Erminio, over the phone to review goals/expectations of CIR stay.  We reviewed 3 hrs/day of therapy, physician follow up, and length of stay up to 2 weeks (dependent upon progress) with goals of 24/7 supervision.  We discussed that pt will likely be fairly physically independent, but from a cognitive/language perspective will need supervision.  Erminio states that she can provide expected level of care at discharge.  We reviewed insurance and she confirms [employer] as primary payor.  I will send for prior auth request today.   Reche Lowers, PT, DPT Admissions Coordinator [phone] 08/23/2024 3:51 PM

## 2024-08-23 NOTE — Progress Notes (Signed)
 Speech Language Pathology Treatment: Cognitive-Linguistic  Patient Details Name: Wendy Love MRN: [id number] DOB: 20-Jul-1941 Today's Date: 08/23/2024 Time: 1200-1229 SLP Time Calculation (min) (ACUTE ONLY): 29 min  Assessment / Plan / Recommendation Clinical Impression  Pt seen for follow up language intervention, targeting further dynamic assessment for graphic expression and reading comprehension and introduction of compensatory strategies. Pt demonstrating intermittent semantic paraphasias with graphic/verbal expression, though identified and corrected with min verbal cues. Reading comprehension appears to be relative strength and visual processing is intact for visual assessment of written material. Education and examples provided for use of script training in the setting of increased communication demand, methods for managing communication breakdown, and external factors that impact language (context, fatigue, frustration, etc)- pt and daughter reported understanding. Recommend follow up SLP services at next venue of care. SLP will monitor for additional acute needs.     HPI HPI: Wendy Love is a 83 y.o. female with medical history significant of whitecoat syndrome, ribromuscular dysplasia of renal artery, renovascular hypertension    Hypothyroidism, hyperlipidemia, chronic diastolic CHF, insomnia, who presents with confusion.    MRI 08/22/2024  1. Small focus of acute ischemia at the left caudothalamic junction.  2. Multiple chronic cerebellar small vessel infarcts.  3. Multifocal hyperintense T2-weighted signal within the cerebral white matter,  most commonly due to chronic small vessel disease.      SLP Plan  Continue with current plan of care         Recommendations                    Oral care BID;Staff/trained caregiver to provide oral care   Intermittent Supervision/Assistance Aphasia (R47.01)     Continue with current plan of care    Remmie Bembenek Clapp, MS, CCC-SLP Speech Language Pathologist Rehab Services; [facility] [phone] (ascom)   Annaleigh Steinmeyer J Clapp  08/23/2024, 12:30 PM

## 2024-08-23 NOTE — Progress Notes (Signed)
 PROGRESS NOTE    Wendy Love  FMW:[id number] DOB: [birth year] DOA: 08/21/2024 PCP: Marylynn Verneita CROME, MD  No chief complaint on file.   Hospital Course:  Wendy Love is an 83yoM with fibromuscular dysplasia, renovascular hypertension, hypothyroidism, hyperlipidemia, chronic basilar CHF, insomnia, who presents with new confusion and word salad.  MRI confirmed acute CVA with ischemia at the left caudothalamic junction and multiple chronic cerebellar small vessel infarcts.  She was admitted for stroke workup.  CTA  head and neck revealed severe left ICA terminus, left MCA origin, bilateral PCA P2 stenosis.  Left ACA is functionally occluded. Echo EF 55-60% with no source of cardiac emboli.  LDL 123. Hemoglobin A1c 4.8%, TSH WNL. She was started on low-dose statin and aspirin  Plavix .  She is gradually making improvement in her word-finding difficulties.  She is now pending placement at CIR.  Subjective: No acute events overnight.  Blood pressure has been slightly elevated On evaluation today patient is alert and oriented x 4.  She demonstrates some improvement with her word finding difficulties.  Her daughter endorses the same.   Objective: Vitals:   08/23/24 0708 08/23/24 0846 08/23/24 0957 08/23/24 1248  BP: (!) 180/90 (!) 177/78 (!) 163/76 (!) 159/73  Pulse: 76 67 66 64  Resp: 16  20 18   Temp: (!) 97.4 F (36.3 C) 98.6 F (37 C) 97.8 F (36.6 C) 98.1 F (36.7 C)  TempSrc: Axillary Oral    SpO2: 96% 95% 99% 96%  Weight:      Height:        Intake/Output Summary (Last 24 hours) at 08/23/2024 1432 Last data filed at 08/23/2024 0900 Gross per 24 hour  Intake 120 ml  Output --  Net 120 ml   Filed Weights   08/22/24 0146  Weight: 58.1 kg    Examination: General exam: Appears calm and comfortable, NAD  Respiratory system: No work of breathing, symmetric chest wall expansion Cardiovascular system: S1 & S2 heard, RRR.  Gastrointestinal system: Abdomen is  nondistended, soft and nontender.  Neuro: Alert and oriented.  Word finding difficulty, improved from prior Extremities: Symmetric, expected ROM Skin: No rashes, lesions Psychiatry: Demonstrates appropriate judgement and insight. Mood & affect appropriate for situation.   Assessment & Plan:  Principal Problem:   Stroke [facility]) Active Problems:   Fibromuscular dysplasia of renal artery   Renovascular hypertension   Hypertensive urgency   Hypothyroidism   Hyperlipidemia   Chronic diastolic CHF (congestive heart failure) (HCC)   INSOMNIA, CHRONIC   Acute CVA, left caudothalamic junction infarct - CTA head and neck with severe left ICA, left MCA, and left PCA disease.  Left ACA functionally occluded. - Patient was on aspirin  prior to admission.  Will proceed with aspirin  and Plavix  now - Hemoglobin A1c at goal 4.8% - LDL 123, has history of myalgias on statin.  Have started with low-dose and will titrate up as tolerated - Continue on telemetry monitoring, no events - EF 55 to 60%, grade 1 diastolic dysfunction, no cardiac source of emboli - Will gradually begin reducing blood pressure today - Outpatient follow-up with vascular surgery - Planning to discharge to [facility] inpatient rehab, pending insurance authorization and CAR-T approval  Fibromuscular dysplasia Renovascular hypertension - Resume all home meds - Plan enroll in acute CVA as above  Hyperlipidemia - Gradually reintroducing statin, does have history of myalgias.  Chronic diastolic CHF - EF 55 to 60%, grade 1 diastolic dysfunction.  Clinically euvolemic - Have discussed with her daughter  who was not aware of this diagnosis  Hypothyroidism - Resume home dose synthroid . TSH WNL   DVT prophylaxis: Lovenox    Code Status: Full Code Disposition:  Inpatient, pending CIR placement  Consultants:    Procedures:    Antimicrobials:  Anti-infectives (From admission, onward)    None       Data Reviewed: I have  personally reviewed following labs and imaging studies CBC: Recent Labs  Lab 08/21/24 2255 08/22/24 1119  WBC 6.9 5.6  HGB 14.0 13.1  HCT 40.1 37.5  MCV 92.4 91.5  PLT 245 219   Basic Metabolic Panel: Recent Labs  Lab 08/21/24 2255 08/22/24 1119  NA 141 139  K 4.0 3.8  CL 102 105  CO2 25 24  GLUCOSE 115* 99  BUN 19 12  CREATININE 0.61 0.58  CALCIUM  9.8 9.3   GFR: Estimated Creatinine Clearance: 41.4 mL/min (by C-G formula based on SCr of 0.58 mg/dL). Liver Function Tests: Recent Labs  Lab 08/21/24 2255  AST 28  ALT 20  ALKPHOS 78  BILITOT 0.3  PROT 7.1  ALBUMIN 4.5   CBG: No results for input(s): GLUCAP in the last 168 hours.  Recent Results (from the past 240 hours)  Resp panel by RT-PCR (RSV, Flu A&B, Covid) Anterior Nasal Swab     Status: None   Collection Time: 08/21/24 11:45 PM   Specimen: Anterior Nasal Swab  Result Value Ref Range Status   SARS Coronavirus 2 by RT PCR NEGATIVE NEGATIVE Final    Comment: (NOTE) SARS-CoV-2 target nucleic acids are NOT DETECTED.  The SARS-CoV-2 RNA is generally detectable in upper respiratory specimens during the acute phase of infection. The lowest concentration of SARS-CoV-2 viral copies this assay can detect is 138 copies/mL. A negative result does not preclude SARS-Cov-2 infection and should not be used as the sole basis for treatment or other patient management decisions. A negative result may occur with  improper specimen collection/handling, submission of specimen other than nasopharyngeal swab, presence of viral mutation(s) within the areas targeted by this assay, and inadequate number of viral copies(<138 copies/mL). A negative result must be combined with clinical observations, patient history, and epidemiological information. The expected result is Negative.  Fact Sheet for Patients:  bloggercourse.com  Fact Sheet for Healthcare Providers:   seriousbroker.it  This test is no t yet approved or cleared by the [place]  FDA and  has been authorized for detection and/or diagnosis of SARS-CoV-2 by FDA under an Emergency Use Authorization (EUA). This EUA will remain  in effect (meaning this test can be used) for the duration of the COVID-19 declaration under Section 564(b)(1) of the Act, 21 U.S.C.section 360bbb-3(b)(1), unless the authorization is terminated  or revoked sooner.       Influenza A by PCR NEGATIVE NEGATIVE Final   Influenza B by PCR NEGATIVE NEGATIVE Final    Comment: (NOTE) The Xpert Xpress SARS-CoV-2/FLU/RSV plus assay is intended as an aid in the diagnosis of influenza from Nasopharyngeal swab specimens and should not be used as a sole basis for treatment. Nasal washings and aspirates are unacceptable for Xpert Xpress SARS-CoV-2/FLU/RSV testing.  Fact Sheet for Patients: bloggercourse.com  Fact Sheet for Healthcare Providers: seriousbroker.it  This test is not yet approved or cleared by the [place]  FDA and has been authorized for detection and/or diagnosis of SARS-CoV-2 by FDA under an Emergency Use Authorization (EUA). This EUA will remain in effect (meaning this test can be used) for the duration of the COVID-19 declaration under  Section 564(b)(1) of the Act, 21 U.S.C. section 360bbb-3(b)(1), unless the authorization is terminated or revoked.     Resp Syncytial Virus by PCR NEGATIVE NEGATIVE Final    Comment: (NOTE) Fact Sheet for Patients: bloggercourse.com  Fact Sheet for Healthcare Providers: seriousbroker.it  This test is not yet approved or cleared by the [place]  FDA and has been authorized for detection and/or diagnosis of SARS-CoV-2 by FDA under an Emergency Use Authorization (EUA). This EUA will remain in effect (meaning this test can be used) for  the duration of the COVID-19 declaration under Section 564(b)(1) of the Act, 21 U.S.C. section 360bbb-3(b)(1), unless the authorization is terminated or revoked.  Performed at [facility], [address] [place], [place] [address]      Radiology Studies: ECHOCARDIOGRAM COMPLETE Result Date: 08/22/2024    ECHOCARDIOGRAM REPORT   Patient Name:   Wendy Love Date of Exam: 08/22/2024 Medical Rec #:  [id number]            Height:       62.0 in Accession #:    [id number]           Weight:       128.0 lb Date of Birth:  [birth year]            BSA:          1.581 m Patient Age:    83 years             BP:           144/69 mmHg Patient Gender: F                    HR:           65 bpm. Exam Location:  [facility] Procedure: 2D Echo, Cardiac Doppler and Color Doppler (Both Spectral and Color            Flow Doppler were utilized during procedure). Indications:     Stroke I63.9  History:         Patient has prior history of Echocardiogram examinations. Risk                  Factors:Hypertension. Cancer.  Sonographer:     Bari Roar Referring Phys:  [id number] XILIN NIU Diagnosing Phys: Wendy Bruckner MD IMPRESSIONS  1. Left ventricular ejection fraction, by estimation, is 55 to 60%. The left ventricle has normal function. The left ventricle has no regional wall motion abnormalities. There is mild concentric left ventricular hypertrophy. Left ventricular diastolic parameters are consistent with Grade I diastolic dysfunction (impaired relaxation).  2. Right ventricular systolic function is normal. The right ventricular size is normal. There is normal pulmonary artery systolic pressure.  3. The mitral valve is normal in structure. Trivial mitral valve regurgitation. No evidence of mitral stenosis.  4. The aortic valve is tricuspid. Aortic valve regurgitation is not visualized. No aortic stenosis is present.  5. The inferior vena cava is dilated in size with >50% respiratory variability, suggesting  right atrial pressure of 8 mmHg. Comparison(s): No significant change from prior study. Conclusion(s)/Recommendation(s): Otherwise normal echocardiogram, with minor abnormalities described in the report. FINDINGS  Left Ventricle: Left ventricular ejection fraction, by estimation, is 55 to 60%. The left ventricle has normal function. The left ventricle has no regional wall motion abnormalities. The left ventricular internal cavity size was normal in size. There is  mild concentric left ventricular hypertrophy. Left ventricular diastolic parameters are consistent with Grade I diastolic  dysfunction (impaired relaxation). Right Ventricle: The right ventricular size is normal. No increase in right ventricular wall thickness. Right ventricular systolic function is normal. There is normal pulmonary artery systolic pressure. The tricuspid regurgitant velocity is 2.57 m/s, and  with an assumed right atrial pressure of 8 mmHg, the estimated right ventricular systolic pressure is 34.4 mmHg. Left Atrium: Left atrial size was normal in size. Right Atrium: Right atrial size was normal in size. Pericardium: There is no evidence of pericardial effusion. Mitral Valve: The mitral valve is normal in structure. Trivial mitral valve regurgitation. No evidence of mitral valve stenosis. MV peak gradient, 5.2 mmHg. The mean mitral valve gradient is 2.0 mmHg. Tricuspid Valve: The tricuspid valve is normal in structure. Tricuspid valve regurgitation is mild . No evidence of tricuspid stenosis. Aortic Valve: The aortic valve is tricuspid. Aortic valve regurgitation is not visualized. No aortic stenosis is present. Aortic valve mean gradient measures 2.0 mmHg. Aortic valve peak gradient measures 4.8 mmHg. Aortic valve area, by VTI measures 1.98 cm. Pulmonic Valve: The pulmonic valve was grossly normal. Pulmonic valve regurgitation is mild. No evidence of pulmonic stenosis. Aorta: The aortic root, ascending aorta and aortic arch are all  structurally normal, with no evidence of dilitation or obstruction. Venous: The inferior vena cava is dilated in size with greater than 50% respiratory variability, suggesting right atrial pressure of 8 mmHg. IAS/Shunts: The atrial septum is grossly normal.  LEFT VENTRICLE PLAX 2D LVIDd:         4.00 cm     Diastology LVIDs:         2.80 cm     LV e' medial:    5.22 cm/s LV PW:         1.30 cm     LV E/e' medial:  12.4 LV IVS:        1.20 cm     LV e' lateral:   5.22 cm/s LVOT diam:     1.70 cm     LV E/e' lateral: 12.4 LV SV:         38 LV SV Index:   24 LVOT Area:     2.27 cm  LV Volumes (MOD) LV vol d, MOD A2C: 43.1 ml LV vol d, MOD A4C: 46.9 ml LV vol s, MOD A2C: 18.8 ml LV vol s, MOD A4C: 18.6 ml LV SV MOD A2C:     24.3 ml LV SV MOD A4C:     46.9 ml LV SV MOD BP:      26.9 ml RIGHT VENTRICLE RV Basal diam:  2.70 cm RV Mid diam:    2.30 cm RV S prime:     12.80 cm/s TAPSE (M-mode): 2.4 cm LEFT ATRIUM             Index        RIGHT ATRIUM          Index LA diam:        3.60 cm 2.28 cm/m   RA Area:     9.44 cm LA Vol (A2C):   31.7 ml 20.05 ml/m  RA Volume:   16.00 ml 10.12 ml/m LA Vol (A4C):   30.1 ml 19.03 ml/m LA Biplane Vol: 30.8 ml 19.48 ml/m  AORTIC VALVE                    PULMONIC VALVE AV Area (Vmax):    1.65 cm     PV Vmax:  1.07 m/s AV Area (Vmean):   1.68 cm     PV Peak grad:     4.6 mmHg AV Area (VTI):     1.98 cm     PR End Diast Vel: 7.29 msec AV Vmax:           110.00 cm/s  RVOT Peak grad:   2 mmHg AV Vmean:          70.600 cm/s AV VTI:            0.191 m AV Peak Grad:      4.8 mmHg AV Mean Grad:      2.0 mmHg LVOT Vmax:         79.80 cm/s LVOT Vmean:        52.200 cm/s LVOT VTI:          0.167 m LVOT/AV VTI ratio: 0.87  AORTA Ao Root diam: 2.20 cm Ao Asc diam:  2.50 cm MITRAL VALVE               TRICUSPID VALVE MV Area VTI:  1.80 cm     TR Peak grad:   26.4 mmHg MV Peak grad: 5.2 mmHg     TR Vmax:        257.00 cm/s MV Mean grad: 2.0 mmHg MV Vmax:      1.14 m/s     SHUNTS MV  Vmean:     68.5 cm/s    Systemic VTI:  0.17 m MV E velocity: 64.50 cm/s  Systemic Diam: 1.70 cm MV A velocity: 99.00 cm/s MV E/A ratio:  0.65 MV A Prime:    10.0 cm/s Wendy Bruckner MD Electronically signed by Wendy Bruckner MD Signature Date/Time: 08/22/2024/11:04:05 AM    Final    CT ANGIO HEAD NECK W WO CM Addendum Date: 08/22/2024 ** ADDENDUM #1 ** ADDENDUM: Salient findings relayed by telephone to Dr. Lawence at 5:41 AM on 08/22/2024. ---------------------------------------------------- Electronically signed by: Helayne Hurst MD 08/22/2024 05:50 AM EST RP Workstation: [id number]   Result Date: 08/22/2024 ** ORIGINAL REPORT ** EXAM: CTA Head and Neck with Intravenous Contrast. CT Head without Contrast. CLINICAL HISTORY: 83 year old female with acute lacunar infarct of left corona radiata, basal ganglia. Stroke/TIA, determine embolic source. TECHNIQUE: Axial CTA images of the head and neck performed with intravenous contrast. MIP reconstructed images were created and reviewed. Axial computed tomography images of the head/brain performed without intravenous contrast. Note: Per PQRS, the description of internal carotid artery percent stenosis, including 0 percent or normal exam, is based on [place] American Symptomatic Carotid Endarterectomy Trial (NASCET) criteria. Dose reduction technique was used including one or more of the following: automated exposure control, adjustment of mA and kV according to patient size, and/or iterative reconstruction. CONTRAST: With and without. 75 mL (iohexol  (OMNIPAQUE ) 350 MG/ML injection 75 mL IOHEXOL  350 MG/ML SOLN). COMPARISON: Brain MRI 08/22/2024 01:16 AM. Head CT 08/21/2024. FINDINGS: CT HEAD: BRAIN: Acute lacunar type infarct of the left corona radiata, basal ganglia. Patchy bilateral white matter hypodensity is stable. The left corona radiata acute ischemia is occult by CT. No acute intracranial hemorrhage or mass effect. Patchy chronic bilateral cerebellar  infarcts redemonstrated. No mass lesion. No CT evidence for acute territorial infarct. No midline shift or extra-axial collection. VENTRICLES: No hydrocephalus. ORBITS: The orbits are unremarkable. SINUSES AND MASTOIDS: The paranasal sinuses and mastoid air cells are clear. CTA NECK: COMMON CAROTID ARTERIES: Tortuous right CCA. Left CCA origin calcified plaque without stenosis. Tortuous left CCA. No dissection or occlusion.  INTERNAL CAROTID ARTERIES: Bulky calcified atherosclerosis at the right ICA origin. Proximal right ICA narrowed with estimated 68% stenosis. The right ICA remains patent and is highly tortuous distal to the bulb. Mildly tortuous right ICA siphon with mild to moderate calcified atherosclerosis but no siphon stenosis. Mild calcified plaque at the left carotid bifurcation without stenosis. Tortuous left ICA just below the skull base. Left ICA siphon moderate calcified plaque but no significant stenosis through the mid supraclinoid segment. However, Very Severe stenosis at the left ICA terminus, left MCA and ACA origins (series 15 image 21 and series 10 image 220. This more resembles atherosclerosis than thrombosis . VERTEBRAL ARTERIES: Normal right vertebral artery origin. Tortuous right V1 segment. Mildly tortuous right vertebral artery in the neck, and the right vertebral artery is mildly dominant throughout. Normal right vertebrobasilar junction. No right vertebral artery stenosis. Normal left vertebral artery origin. Tortuous left V1 segment. Tortuous mildly nondominant left vertebral artery with no stenosis to the left PICA origin. Left V4 segment mild stenosis to the vertebrobasilar junction. No dissection or occlusion. CTA HEAD: ANTERIOR CEREBRAL ARTERIES: Left ACA origin is functionally occluded. This more resembles atherosclerosis than thrombosis. Reconstituted left ACA A2 enhancement is normal. Mild bilateral ACA branch irregularity. Normal anterior communicating artery. No aneurysm.  MIDDLE CEREBRAL ARTERIES: Left MCA origin remains patent but is severely stenotic. This more resembles atherosclerosis than thrombosis. Left MCA M1 segment is mildly irregular. Patent left MCA bifurcation. No other left MCA branch stenosis. Similar mild to moderate irregularity of the right MCA M1 segment. Mild right MCA M1 stenosis. Patent right MCA bifurcation. No significant right MCA branch stenosis. No aneurysm. POSTERIOR CEREBRAL ARTERIES: Patent SCA and PCA origins. Moderate bilateral PCA branch irregularity. Severe right PCA P2 segment stenosis. Distal right PCA branches remain patent. Moderate to severe similar left PCA P2 and P3 segment stenosis, also without occlusion. The right PCA P2 segment stenosis is greater than the left PCA P2 segment stenosis. This more resembles atherosclerosis than thrombosis. No aneurysm. BASILAR ARTERY: Patent basilar artery without stenosis. No aneurysm. OTHER: 3 vessel aortic arch configuration. Calcified aortic arch atherosclerosis. Brachiocephalic artery atherosclerosis without stenosis. Mild right subclavian origin atherosclerosis without stenosis. Proximal left subclavian artery atherosclerosis without stenosis. Major dural venous sinuses are enhancing and appear to be patent. SOFT TISSUES: Negative nonvascular neck soft tissue spaces. Negative visible upper chest. BONES: Advanced cervical spine degeneration superimposed on degenerative appearing ankylosis of C2-C3. Multilevel degenerative cervical spondylolisthesis. Underlying mild cervicothoracic scoliosis. No acute osseous abnormality. IMPRESSION: 1. Negative for ELVO, but Positive for Severe intracranial atherosclerosis with CRITICAL stenoses at the Left ICA terminus, left MCA origin, and right > than left PCA P2 segments. Functionally occluded Left ACA origin, but left A2 reconstituted by the [employer]. 2. Right ICA origin in the neck high grade atherosclerotic stenosis estimated at 68%. 3. Acute lacunar infarct  involving the left corona radiata and basal ganglia occult by CT. No new intracranial abnormality. Electronically signed by: Helayne Hurst MD 08/22/2024 05:24 AM EST RP Workstation: [id number]   MR Brain W and Wo Contrast Result Date: 08/22/2024 EXAM: MRI BRAIN WITH AND WITHOUT CONTRAST 08/22/2024 01:33:50 AM TECHNIQUE: Multiplanar multisequence MRI of the head/brain was performed with and without the administration of 6 mL of gadobutrol  (GADAVIST ) 1 MMOL/ML intravenous contrast. COMPARISON: None available. CLINICAL HISTORY: Mental status change, unknown cause. FINDINGS: BRAIN AND VENTRICLES: Small focus of acute ischemia at the left caudothalamic junction. Multifocal hyperintense T2-weighted signal within the cerebral white matter, most commonly due to  chronic small vessel disease. Multiple cerebellar small vessel infarcts. No acute intracranial hemorrhage. No mass effect or midline shift. No hydrocephalus. The sella is unremarkable. Normal flow voids. No mass or abnormal enhancement. ORBITS: No acute abnormality. SINUSES: No acute abnormality. BONES AND SOFT TISSUES: Normal bone marrow signal and enhancement. No acute soft tissue abnormality. IMPRESSION: 1. Small focus of acute ischemia at the left caudothalamic junction. 2. Multiple chronic cerebellar small vessel infarcts. 3. Multifocal hyperintense T2-weighted signal within the cerebral white matter, most commonly due to chronic small vessel disease. Electronically signed by: Franky Stanford MD 08/22/2024 01:56 AM EST RP Workstation: [id number]   CT Head Wo Contrast Result Date: 08/21/2024 EXAM: CT HEAD WITHOUT 08/21/2024 11:27:00 PM TECHNIQUE: CT of the head was performed without the administration of intravenous contrast. Automated exposure control, iterative reconstruction, and/or weight based adjustment of the mA/kV was utilized to reduce the radiation dose to as low as reasonably achievable. COMPARISON: None available. CLINICAL HISTORY: confusion  FINDINGS: BRAIN AND VENTRICLES: No acute intracranial hemorrhage. No mass effect or midline shift. No extra-axial fluid collection. No evidence of acute infarct. No hydrocephalus. Remote left cerebellar infarcts. Global cortical atrophy. Subcortical and periventricular small vessel ischemic changes. Intracranial atherosclerosis. ORBITS: No acute abnormality. SINUSES AND MASTOIDS: No acute abnormality. SOFT TISSUES AND SKULL: No acute skull fracture. No acute soft tissue abnormality. IMPRESSION: 1. No acute intracranial abnormality. 2. Remote left cerebellar infarcts. Electronically signed by: Pinkie Pebbles MD 08/21/2024 11:35 PM EST RP Workstation: [id number]   DG Chest Portable 1 View Result Date: 08/21/2024 EXAM: 1 VIEW(S) XRAY OF THE CHEST 08/21/2024 11:13:00 PM COMPARISON: Treatment 04/11/2221. CLINICAL HISTORY: weakness FINDINGS: LUNGS AND PLEURA: No focal pulmonary opacity. No pleural effusion. No pneumothorax. HEART AND MEDIASTINUM: Aortic atherosclerosis. No acute abnormality of the cardiac and mediastinal silhouettes. BONES AND SOFT TISSUES: Multilevel thoracic osteophytosis. Dextroconvex thoracolumbar scoliosis. IMPRESSION: 1. No acute cardiopulmonary process identified. Electronically signed by: Greig Pique MD 08/21/2024 11:16 PM EST RP Workstation: [id number]    Scheduled Meds:   stroke: early stages of recovery book   Does not apply Once   aspirin   300 mg Rectal Daily   Or   aspirin   325 mg Oral Daily   enoxaparin  (LOVENOX ) injection  40 mg Subcutaneous Q24H   levothyroxine   75 mcg Oral Q0600   melatonin  2.5 mg Oral QHS   rosuvastatin   20 mg Oral Daily   Continuous Infusions:   LOS: 1 day  MDM: Patient is high risk for one or more organ failure.  They necessitate ongoing hospitalization for continued IV therapies and subsequent lab monitoring. Total time spent interpreting labs and vitals, reviewing the medical record, coordinating care amongst consultants and care team members,  directly assessing and discussing care with the patient and/or family: 55 min  Mashayla Lavin, DO [facility]  To contact the attending physician between 7A-7P please use Epic Chat. To contact the covering physician during after hours 7P-7A, please review Amion.  08/23/2024, 2:32 PM   *This document has been created with the assistance of dictation software. Please excuse typographical errors. *

## 2024-08-23 NOTE — Progress Notes (Signed)
 Occupational Therapy Treatment Patient Details Name: Wendy Love MRN: [id number] DOB: 03-08-1941 Today's Date: 08/23/2024   History of present illness Wendy Love is a 83 y.o. female with medical history significant of whitecoat syndrome, ribromuscular dysplasia of renal artery, renovascular hypertension, hypothyroidism, hyperlipidemia, chronic diastolic CHF, insomnia, who presents with confusion. MRI showed small focus of acute ischemia at the left caudothalamic junction, multiple chronic cerebellar small vessel infarcts. CTA showed acute lacunar infarct involving the left corona radiata and basal ganglia.   OT comments  Pt seen for OT treatment this date. Pt received in bed with supportive daughter present throughout. Pt is grossly A&Ox4 when given increased time (initially states year as her birth year and is able to self-correct to 2026). Pt continues to demonstrate intermittent word salad when conversing, demonstrates decreased insight into deficits, lacks safety awareness / judgement to safely perform ADL/IADL mgmt independently, and has executive functioning impairments most noted in problem-solving, task initiation, organization, reasoning and sequencing complex tasks. Session focused on functional cognition with dual-task while mobilizing in busy hallway, CGA-supervision without AD. Pt unable to correctly complete task as instructed, requires frequent cues to redirect and resume sequence of alphabetical animal names, ultimately unable to finish 70% of task. Pt will benefit from intensive inpatient therapy at 3+ hours a day as pt is well below baseline level of functioning (prior to admission, pt very active in community and driving).       If plan is discharge home, recommend the following:  A little help with walking and/or transfers;A little help with bathing/dressing/bathroom;Help with stairs or ramp for entrance;Supervision due to cognitive status;Assist for  transportation;Assistance with cooking/housework;Direct supervision/assist for financial management;Direct supervision/assist for medications management   Equipment Recommendations  Other (comment)       Precautions / Restrictions Precautions Precautions: Fall Recall of Precautions/Restrictions: Impaired Restrictions Weight Bearing Restrictions Per Provider Order: No       Mobility Bed Mobility Overal bed mobility: Needs Assistance Bed Mobility: Supine to Sit       Sit to supine: Supervision, HOB elevated, Used rails        Transfers Overall transfer level: Needs assistance Equipment used: None Transfers: Sit to/from Stand Sit to Stand: Supervision           General transfer comment: impulsive, decreased safety awareness (often disregards cues for wait for environmental setup, gait belt, etc)     Balance Overall balance assessment: Needs assistance Sitting-balance support: Feet supported, No upper extremity supported Sitting balance-Leahy Scale: Good     Standing balance support: During functional activity, No upper extremity supported Standing balance-Leahy Scale: Fair                             ADL either performed or assessed with clinical judgement   ADL Overall ADL's : Needs assistance/impaired                                     Functional mobility during ADLs: Contact guard assist General ADL Comments: CGA-supervision for mobility, transfers and ADL performance. Main limitations are deficits in executive functioning with decreased insight into deficits, decreased safety awareness and difficulties initiating, problem-solving, and organizing complex tasks.     Communication Communication Communication: Impaired Factors Affecting Communication: Difficulty expressing self;Reduced clarity of speech (pt with word salad, often requires increased time for responses and off topic in speech  patterns)   Cognition Arousal:  Alert Behavior During Therapy: Impulsive Cognition: Cognition impaired Difficult to assess due to: Impaired communication Orientation impairments:  (improved orientation this date, pt with occassional word salad when asked questions but A&Ox4 grossly. Initially stated 1942 as year, but given time is able to self-correct to 2025.) Awareness: Intellectual awareness impaired, Online awareness impaired Memory impairment (select all impairments): Short-term memory, Working memory Attention impairment (select first level of impairment): Selective attention, Alternating attention, Divided attention Executive functioning impairment (select all impairments): Problem solving, Reasoning, Initiation, Organization OT - Cognition Comments: Required cues for task initiation, disregards cues for safety / impulsive with decreased safety awareness. Able to sequence simple, familier functional tasks but difficulties with more complex decision making. Unable to correctly recall what she had for lunch (just finished lunch upon OT arrival).                 Following commands: Impaired Following commands impaired: Follows one step commands inconsistently, Follows one step commands with increased time      Cueing   Cueing Techniques: Verbal cues, Gestural cues  Exercises Exercises: Other exercises Other Exercises Other Exercises: edu to pt and daughter on role / purpose of OT, AIR recommendations / expectations Other Exercises: edu on functional IADL complex tasks as HEP. Discussed with daughter to work on secretary/administrator (pt tasked to call in order for lunch, pt unable to problem-solve how to dial phone), shopping tasks with budget and money mgmt, bill paying Other Exercises: pt works on dual-tasking while mobilizing without AD on unit. pt given task (naming animals in alphabetical order) while navigating busy hallway, pt with slow recall and only able to name animals A-I, stopping frequently  and forgetting task, requires cues to organize thoughts and continue. Pt ultimately unable to complete task. Other Exercises: pt to alternate calling out room numbers while mobilizing, pt with delay in response and only calls out two numbers before forgetting task and stopping. requires cues to continue and pt unable without max verbal cues            Pertinent Vitals/ Pain       Pain Assessment Pain Assessment: No/denies pain     Prior Functioning/Environment              Frequency  Min 3X/week        Progress Toward Goals  OT Goals(current goals can now be found in the care plan section)  Progress towards OT goals: Progressing toward goals  Acute Rehab OT Goals OT Goal Formulation: With patient/family Time For Goal Achievement: 09/05/24 Potential to Achieve Goals: Good ADL Goals Pt Will Perform Grooming: standing;with modified independence Pt Will Perform Upper Body Dressing: standing;with modified independence Pt Will Perform Lower Body Dressing: sit to/from stand;with modified independence Pt Will Transfer to Toilet: regular height toilet;with modified independence;ambulating Pt Will Perform Toileting - Clothing Manipulation and hygiene: with modified independence;sit to/from stand;with adaptive equipment  Plan         AM-PAC OT 6 Clicks Daily Activity     Outcome Measure   Help from another person eating meals?: A Little Help from another person taking care of personal grooming?: A Little Help from another person toileting, which includes using toliet, bedpan, or urinal?: A Little Help from another person bathing (including washing, rinsing, drying)?: A Little Help from another person to put on and taking off regular upper body clothing?: A Little Help from another person to put on and taking off regular lower  body clothing?: A Little 6 Click Score: 18    End of Session Equipment Utilized During Treatment: Gait belt  OT Visit Diagnosis: Other  abnormalities of gait and mobility (R26.89);Cognitive communication deficit (R41.841) Symptoms and signs involving cognitive functions: Cerebral infarction   Activity Tolerance Patient tolerated treatment well   Patient Left in chair;with call bell/phone within reach;with chair alarm set;with family/visitor present   Nurse Communication Mobility status        Time: 8653-8582 OT Time Calculation (min): 31 min  Charges: OT General Charges $OT Visit: 1 Visit OT Treatments $Self Care/Home Management : 8-22 mins $Therapeutic Activity: 8-22 mins  Doy Taaffe L. Bexlee Bergdoll, OTR/L  08/23/2024, 3:56 PM

## 2024-08-24 MED ORDER — CARVEDILOL 3.125 MG PO TABS
3.1250 mg | ORAL_TABLET | Freq: Two times a day (BID) | ORAL | Status: DC
  Administered 2024-08-24 – 2024-08-25 (×2): 3.125 mg via ORAL
  Filled 2024-08-24 (×2): qty 1

## 2024-08-24 MED ORDER — IRBESARTAN 150 MG PO TABS
300.0000 mg | ORAL_TABLET | Freq: Every day | ORAL | Status: DC
  Administered 2024-08-24 – 2024-08-25 (×2): 300 mg via ORAL
  Filled 2024-08-24 (×2): qty 2

## 2024-08-24 MED ORDER — MAGNESIUM OXIDE -MG SUPPLEMENT 400 (240 MG) MG PO TABS
400.0000 mg | ORAL_TABLET | Freq: Every day | ORAL | Status: DC
  Administered 2024-08-24 – 2024-08-25 (×2): 400 mg via ORAL
  Filled 2024-08-24 (×2): qty 1

## 2024-08-24 MED ORDER — VITAMIN D 25 MCG (1000 UNIT) PO TABS
5000.0000 [IU] | ORAL_TABLET | Freq: Every day | ORAL | Status: DC
  Administered 2024-08-24 – 2024-08-25 (×2): 5000 [IU] via ORAL
  Filled 2024-08-24 (×2): qty 5

## 2024-08-24 NOTE — Progress Notes (Signed)
 Physical Therapy Treatment Patient Details Name: STINA GANE MRN: [id number] DOB: Feb 21, 1941 Today's Date: 08/24/2024   History of Present Illness Anouk W Raulerson is a 83 y.o. female with medical history significant of whitecoat syndrome, ribromuscular dysplasia of renal artery, renovascular hypertension, hypothyroidism, hyperlipidemia, chronic diastolic CHF, insomnia, who presents with confusion. MRI showed small focus of acute ischemia at the left caudothalamic junction, multiple chronic cerebellar small vessel infarcts. CTA showed acute lacunar infarct involving the left corona radiata and basal ganglia.    PT Comments  The patient is agreeable to PT session. Supportive daughter at the bedside. The patient continues to demonstrate higher level balance deficits requiring up to Min A without assistive device. Decreased gait speed is noted when challenged with dynamic activity and dual tasking. The patient reports feeling generalized weakness in both legs compared to her baseline mobility. The patient is not at her baseline level of functional independence. Recommend intensive inpatient rehabilitation > 3 hours/day after this hospital stay. PT will continue to follow.    If plan is discharge home, recommend the following: A little help with bathing/dressing/bathroom;Assistance with cooking/housework;Direct supervision/assist for medications management;Direct supervision/assist for financial management;Assist for transportation;Help with stairs or ramp for entrance;Supervision due to cognitive status   Can travel by private vehicle        Equipment Recommendations  None recommended by PT    Recommendations for Other Services Rehab consult     Precautions / Restrictions Precautions Precautions: Fall Recall of Precautions/Restrictions: Impaired Restrictions Weight Bearing Restrictions Per Provider Order: No     Mobility  Bed Mobility Overal bed mobility: Needs  Assistance Bed Mobility: Supine to Sit, Sit to Supine     Supine to sit: Supervision Sit to supine: Supervision   General bed mobility comments: increased time required    Transfers Overall transfer level: Needs assistance Equipment used: None Transfers: Sit to/from Stand Sit to Stand: Contact guard assist           General transfer comment: increased time required    Ambulation/Gait Ambulation/Gait assistance: Contact guard assist, Min assist Gait Distance (Feet):  (71ft, 93ft, 33ft, 22ft, 40ft) Assistive device: None Gait Pattern/deviations: Step-through pattern Gait velocity: decreased (decreased gait speed with dual tasking and dynamic activity)     General Gait Details: loss of balance with higher level balance tasks (see balance section below), requiring Min A for ambulation. mild veering and bumping into an object in the hallway. safety cues and navigation assistance required for hallway ambulation. the patient reports feeling generalized weakness compared to her baseline. Sp02 96% on room air after walking with heart rate in the low 70's   Stairs             Wheelchair Mobility     Tilt Bed    Modified Rankin (Stroke Patients Only)       Balance Overall balance assessment: Needs assistance                           High level balance activites: Backward walking, Direction changes, Turns, Sudden stops, Head turns (tandem walking) High Level Balance Comments: Min A requried with tandem walking with loss of balance to the right. Significant decreased gait speed with any dynamic activity, especially with tandem walking and backwards steps. CGA provided for head turns while walking, bending down to pick up object from floor, reaching outside base of support. education and cues provided for balance deficits  Communication Communication Communication: Impaired Factors Affecting Communication: Difficulty expressing self   Cognition Arousal: Alert Behavior During Therapy: Impulsive   PT - Cognitive impairments: Safety/Judgement, Problem solving, Memory, Difficult to assess Difficult to assess due to: Impaired communication                     PT - Cognition Comments: expressive deficits noted. increased time for command following and inconsistent with multi step commands and dual tasking Following commands: Impaired Following commands impaired: Follows one step commands inconsistently, Follows one step commands with increased time    Cueing Cueing Techniques: Verbal cues, Gestural cues  Exercises      General Comments        Pertinent Vitals/Pain Pain Assessment Pain Assessment: No/denies pain    Home Living                          Prior Function            PT Goals (current goals can now be found in the care plan section) Acute Rehab PT Goals Patient Stated Goal: return to her PLOF as she was extremely independent PT Goal Formulation: With patient/family Time For Goal Achievement: 09/05/24 Potential to Achieve Goals: Good Progress towards PT goals: Progressing toward goals    Frequency    Min 4X/week      PT Plan      Co-evaluation              AM-PAC PT 6 Clicks Mobility   Outcome Measure  Help needed turning from your back to your side while in a flat bed without using bedrails?: None Help needed moving from lying on your back to sitting on the side of a flat bed without using bedrails?: None Help needed moving to and from a bed to a chair (including a wheelchair)?: A Little Help needed standing up from a chair using your arms (e.g., wheelchair or bedside chair)?: A Little Help needed to walk in hospital room?: A Little Help needed climbing 3-5 steps with a railing? : A Little 6 Click Score: 20    End of Session Equipment Utilized During Treatment: Gait belt Activity Tolerance: Patient tolerated treatment well Patient left: in bed;with call  bell/phone within reach;with bed alarm set Nurse Communication: Mobility status PT Visit Diagnosis: Other abnormalities of gait and mobility (R26.89)     Time: 0812-0827 PT Time Calculation (min) (ACUTE ONLY): 15 min  Charges:    $Neuromuscular Re-education: 8-22 mins PT General Charges $$ ACUTE PT VISIT: 1 Visit                     Randine Essex, PT, MPT    Randine LULLA Essex 08/24/2024, 8:44 AM

## 2024-08-24 NOTE — Progress Notes (Signed)
 PROGRESS NOTE    Wendy Love  FMW:[id number] DOB: 1941/07/29 DOA: 08/21/2024 PCP: Marylynn Verneita CROME, MD  No chief complaint on file.   Hospital Course:  Wendy Love is an 83yoM with fibromuscular dysplasia, renovascular hypertension, hypothyroidism, hyperlipidemia, chronic basilar CHF, insomnia, who presents with new confusion and word salad.  MRI confirmed acute CVA with ischemia at the left caudothalamic junction and multiple chronic cerebellar small vessel infarcts.  She was admitted for stroke workup.  CTA  head and neck revealed severe left ICA terminus, left MCA origin, bilateral PCA P2 stenosis.  Left ACA is functionally occluded. Echo EF 55-60% with no source of cardiac emboli.  LDL 123. Hemoglobin A1c 4.8%, TSH WNL. She was started on low-dose statin and aspirin  Plavix .  She is gradually making improvement in her word-finding difficulties.  She is now pending placement at CIR.  Subjective: No acute events overnight.  Patient's speech fluency continues to improve.  Her daughter is at bedside and agrees.  We are awaiting insurance authorization for CIR.  She continues to work well with therapy services  Objective: Vitals:   08/23/24 1940 08/23/24 2347 08/24/24 0448 08/24/24 0759  BP: (!) 146/72 (!) 175/87 (!) 166/72 (!) 172/78  Pulse: 70 73 63 65  Resp: 18 17 17    Temp: 99.1 F (37.3 C) 98.3 F (36.8 C) 98.6 F (37 C) 97.9 F (36.6 C)  TempSrc: Oral     SpO2: 96% 98% 96% 95%  Weight:      Height:       No intake or output data in the 24 hours ending 08/24/24 1139  Filed Weights   08/22/24 0146  Weight: 58.1 kg    Examination: General exam: Appears calm and comfortable, NAD  Respiratory system: No work of breathing, symmetric chest wall expansion Cardiovascular system: S1 & S2 heard, RRR.  Gastrointestinal system: Abdomen is nondistended, soft and nontender.  Neuro: Alert and oriented.  Word finding difficulty, improved from prior Extremities:  Symmetric, expected ROM Skin: No rashes, lesions Psychiatry: Demonstrates appropriate judgement and insight. Mood & affect appropriate for situation.   Assessment & Plan:  Principal Problem:   Stroke [facility]) Active Problems:   Fibromuscular dysplasia of renal artery   Renovascular hypertension   Hypertensive urgency   Hypothyroidism   Hyperlipidemia   Chronic diastolic CHF (congestive heart failure) (HCC)   INSOMNIA, CHRONIC   Acute CVA, left caudothalamic junction infarct - CTA head and neck with severe left ICA, left MCA, and left PCA disease.  Left ACA functionally occluded. - Patient was on aspirin  prior to admission.  Will proceed with aspirin  and Plavix  now - Hemoglobin A1c at goal 4.8% - LDL 123, has history of myalgias on statin.  Have started with low-dose and will titrate up as tolerated over the coming weeks - Continue monitoring on telemetry, no events so far - EF 55 to 60%, grade 1 diastolic dysfunction, no cardiac source of emboli - Reducing blood pressure today.  Has continued to rise.  Resuming all home meds - Outpatient follow-up with vascular surgery - Planning to discharge to [facility] inpatient rehab, pending insurance authorization - Continue working with PT/OT/SLP for now  Fibromuscular dysplasia Renovascular hypertension - Resume all home meds  Hyperlipidemia - Gradually reintroducing statin, does have history of myalgias.  Chronic diastolic CHF - EF 55 to 60%, grade 1 diastolic dysfunction.  Clinically euvolemic at this time.  Monitor volume status - Have discussed with her daughter who was not aware of this  diagnosis  Hypothyroidism - Resume home dose synthroid . TSH WNL   DVT prophylaxis: Lovenox    Code Status: Full Code Disposition:  Inpatient, pending CIR placement  Consultants:    Procedures:    Antimicrobials:  Anti-infectives (From admission, onward)    None       Data Reviewed: I have personally reviewed following labs and imaging  studies CBC: Recent Labs  Lab 08/21/24 2255 08/22/24 1119  WBC 6.9 5.6  HGB 14.0 13.1  HCT 40.1 37.5  MCV 92.4 91.5  PLT 245 219   Basic Metabolic Panel: Recent Labs  Lab 08/21/24 2255 08/22/24 1119  NA 141 139  K 4.0 3.8  CL 102 105  CO2 25 24  GLUCOSE 115* 99  BUN 19 12  CREATININE 0.61 0.58  CALCIUM  9.8 9.3   GFR: Estimated Creatinine Clearance: 41.4 mL/min (by C-G formula based on SCr of 0.58 mg/dL). Liver Function Tests: Recent Labs  Lab 08/21/24 2255  AST 28  ALT 20  ALKPHOS 78  BILITOT 0.3  PROT 7.1  ALBUMIN 4.5   CBG: No results for input(s): GLUCAP in the last 168 hours.  Recent Results (from the past 240 hours)  Resp panel by RT-PCR (RSV, Flu A&B, Covid) Anterior Nasal Swab     Status: None   Collection Time: 08/21/24 11:45 PM   Specimen: Anterior Nasal Swab  Result Value Ref Range Status   SARS Coronavirus 2 by RT PCR NEGATIVE NEGATIVE Final    Comment: (NOTE) SARS-CoV-2 target nucleic acids are NOT DETECTED.  The SARS-CoV-2 RNA is generally detectable in upper respiratory specimens during the acute phase of infection. The lowest concentration of SARS-CoV-2 viral copies this assay can detect is 138 copies/mL. A negative result does not preclude SARS-Cov-2 infection and should not be used as the sole basis for treatment or other patient management decisions. A negative result may occur with  improper specimen collection/handling, submission of specimen other than nasopharyngeal swab, presence of viral mutation(s) within the areas targeted by this assay, and inadequate number of viral copies(<138 copies/mL). A negative result must be combined with clinical observations, patient history, and epidemiological information. The expected result is Negative.  Fact Sheet for Patients:  bloggercourse.com  Fact Sheet for Healthcare Providers:  seriousbroker.it  This test is no t yet approved or  cleared by the [place]  FDA and  has been authorized for detection and/or diagnosis of SARS-CoV-2 by FDA under an Emergency Use Authorization (EUA). This EUA will remain  in effect (meaning this test can be used) for the duration of the COVID-19 declaration under Section 564(b)(1) of the Act, 21 U.S.C.section 360bbb-3(b)(1), unless the authorization is terminated  or revoked sooner.       Influenza A by PCR NEGATIVE NEGATIVE Final   Influenza B by PCR NEGATIVE NEGATIVE Final    Comment: (NOTE) The Xpert Xpress SARS-CoV-2/FLU/RSV plus assay is intended as an aid in the diagnosis of influenza from Nasopharyngeal swab specimens and should not be used as a sole basis for treatment. Nasal washings and aspirates are unacceptable for Xpert Xpress SARS-CoV-2/FLU/RSV testing.  Fact Sheet for Patients: bloggercourse.com  Fact Sheet for Healthcare Providers: seriousbroker.it  This test is not yet approved or cleared by the [place]  FDA and has been authorized for detection and/or diagnosis of SARS-CoV-2 by FDA under an Emergency Use Authorization (EUA). This EUA will remain in effect (meaning this test can be used) for the duration of the COVID-19 declaration under Section 564(b)(1) of the Act, 21  U.S.C. section 360bbb-3(b)(1), unless the authorization is terminated or revoked.     Resp Syncytial Virus by PCR NEGATIVE NEGATIVE Final    Comment: (NOTE) Fact Sheet for Patients: bloggercourse.com  Fact Sheet for Healthcare Providers: seriousbroker.it  This test is not yet approved or cleared by the [place]  FDA and has been authorized for detection and/or diagnosis of SARS-CoV-2 by FDA under an Emergency Use Authorization (EUA). This EUA will remain in effect (meaning this test can be used) for the duration of the COVID-19 declaration under Section 564(b)(1) of the Act, 21  U.S.C. section 360bbb-3(b)(1), unless the authorization is terminated or revoked.  Performed at [facility], [address] [place], [place] [address]      Radiology Studies: ECHOCARDIOGRAM COMPLETE Result Date: 08/22/2024    ECHOCARDIOGRAM REPORT   Patient Name:   TIAH HECKEL O'REILLY Date of Exam: 08/22/2024 Medical Rec #:  [id number]            Height:       62.0 in Accession #:    [id number]           Weight:       128.0 lb Date of Birth:  12/26/1940            BSA:          1.581 m Patient Age:    83 years             BP:           144/69 mmHg Patient Gender: F                    HR:           65 bpm. Exam Location:  [facility] Procedure: 2D Echo, Cardiac Doppler and Color Doppler (Both Spectral and Color            Flow Doppler were utilized during procedure). Indications:     Stroke I63.9  History:         Patient has prior history of Echocardiogram examinations. Risk                  Factors:Hypertension. Cancer.  Sonographer:     Bari Roar Referring Phys:  [id number] XILIN NIU Diagnosing Phys: Shelda Bruckner MD IMPRESSIONS  1. Left ventricular ejection fraction, by estimation, is 55 to 60%. The left ventricle has normal function. The left ventricle has no regional wall motion abnormalities. There is mild concentric left ventricular hypertrophy. Left ventricular diastolic parameters are consistent with Grade I diastolic dysfunction (impaired relaxation).  2. Right ventricular systolic function is normal. The right ventricular size is normal. There is normal pulmonary artery systolic pressure.  3. The mitral valve is normal in structure. Trivial mitral valve regurgitation. No evidence of mitral stenosis.  4. The aortic valve is tricuspid. Aortic valve regurgitation is not visualized. No aortic stenosis is present.  5. The inferior vena cava is dilated in size with >50% respiratory variability, suggesting right atrial pressure of 8 mmHg. Comparison(s): No significant change from prior  study. Conclusion(s)/Recommendation(s): Otherwise normal echocardiogram, with minor abnormalities described in the report. FINDINGS  Left Ventricle: Left ventricular ejection fraction, by estimation, is 55 to 60%. The left ventricle has normal function. The left ventricle has no regional wall motion abnormalities. The left ventricular internal cavity size was normal in size. There is  mild concentric left ventricular hypertrophy. Left ventricular diastolic parameters are consistent with Grade I diastolic dysfunction (impaired relaxation). Right Ventricle: The  right ventricular size is normal. No increase in right ventricular wall thickness. Right ventricular systolic function is normal. There is normal pulmonary artery systolic pressure. The tricuspid regurgitant velocity is 2.57 m/s, and  with an assumed right atrial pressure of 8 mmHg, the estimated right ventricular systolic pressure is 34.4 mmHg. Left Atrium: Left atrial size was normal in size. Right Atrium: Right atrial size was normal in size. Pericardium: There is no evidence of pericardial effusion. Mitral Valve: The mitral valve is normal in structure. Trivial mitral valve regurgitation. No evidence of mitral valve stenosis. MV peak gradient, 5.2 mmHg. The mean mitral valve gradient is 2.0 mmHg. Tricuspid Valve: The tricuspid valve is normal in structure. Tricuspid valve regurgitation is mild . No evidence of tricuspid stenosis. Aortic Valve: The aortic valve is tricuspid. Aortic valve regurgitation is not visualized. No aortic stenosis is present. Aortic valve mean gradient measures 2.0 mmHg. Aortic valve peak gradient measures 4.8 mmHg. Aortic valve area, by VTI measures 1.98 cm. Pulmonic Valve: The pulmonic valve was grossly normal. Pulmonic valve regurgitation is mild. No evidence of pulmonic stenosis. Aorta: The aortic root, ascending aorta and aortic arch are all structurally normal, with no evidence of dilitation or obstruction. Venous: The  inferior vena cava is dilated in size with greater than 50% respiratory variability, suggesting right atrial pressure of 8 mmHg. IAS/Shunts: The atrial septum is grossly normal.  LEFT VENTRICLE PLAX 2D LVIDd:         4.00 cm     Diastology LVIDs:         2.80 cm     LV e' medial:    5.22 cm/s LV PW:         1.30 cm     LV E/e' medial:  12.4 LV IVS:        1.20 cm     LV e' lateral:   5.22 cm/s LVOT diam:     1.70 cm     LV E/e' lateral: 12.4 LV SV:         38 LV SV Index:   24 LVOT Area:     2.27 cm  LV Volumes (MOD) LV vol d, MOD A2C: 43.1 ml LV vol d, MOD A4C: 46.9 ml LV vol s, MOD A2C: 18.8 ml LV vol s, MOD A4C: 18.6 ml LV SV MOD A2C:     24.3 ml LV SV MOD A4C:     46.9 ml LV SV MOD BP:      26.9 ml RIGHT VENTRICLE RV Basal diam:  2.70 cm RV Mid diam:    2.30 cm RV S prime:     12.80 cm/s TAPSE (M-mode): 2.4 cm LEFT ATRIUM             Index        RIGHT ATRIUM          Index LA diam:        3.60 cm 2.28 cm/m   RA Area:     9.44 cm LA Vol (A2C):   31.7 ml 20.05 ml/m  RA Volume:   16.00 ml 10.12 ml/m LA Vol (A4C):   30.1 ml 19.03 ml/m LA Biplane Vol: 30.8 ml 19.48 ml/m  AORTIC VALVE                    PULMONIC VALVE AV Area (Vmax):    1.65 cm     PV Vmax:          1.07 m/s AV Area (  Vmean):   1.68 cm     PV Peak grad:     4.6 mmHg AV Area (VTI):     1.98 cm     PR End Diast Vel: 7.29 msec AV Vmax:           110.00 cm/s  RVOT Peak grad:   2 mmHg AV Vmean:          70.600 cm/s AV VTI:            0.191 m AV Peak Grad:      4.8 mmHg AV Mean Grad:      2.0 mmHg LVOT Vmax:         79.80 cm/s LVOT Vmean:        52.200 cm/s LVOT VTI:          0.167 m LVOT/AV VTI ratio: 0.87  AORTA Ao Root diam: 2.20 cm Ao Asc diam:  2.50 cm MITRAL VALVE               TRICUSPID VALVE MV Area VTI:  1.80 cm     TR Peak grad:   26.4 mmHg MV Peak grad: 5.2 mmHg     TR Vmax:        257.00 cm/s MV Mean grad: 2.0 mmHg MV Vmax:      1.14 m/s     SHUNTS MV Vmean:     68.5 cm/s    Systemic VTI:  0.17 m MV E velocity: 64.50 cm/s  Systemic  Diam: 1.70 cm MV A velocity: 99.00 cm/s MV E/A ratio:  0.65 MV A Prime:    10.0 cm/s Shelda Bruckner MD Electronically signed by Shelda Bruckner MD Signature Date/Time: 08/22/2024/11:04:05 AM    Final    CT ANGIO HEAD NECK W WO CM Addendum Date: 08/22/2024 ** ADDENDUM #1 ** ADDENDUM: Salient findings relayed by telephone to Dr. Lawence at 5:41 AM on 08/22/2024. ---------------------------------------------------- Electronically signed by: Helayne Hurst MD 08/22/2024 05:50 AM EST RP Workstation: [id number]   Result Date: 08/22/2024 ** ORIGINAL REPORT ** EXAM: CTA Head and Neck with Intravenous Contrast. CT Head without Contrast. CLINICAL HISTORY: 83 year old female with acute lacunar infarct of left corona radiata, basal ganglia. Stroke/TIA, determine embolic source. TECHNIQUE: Axial CTA images of the head and neck performed with intravenous contrast. MIP reconstructed images were created and reviewed. Axial computed tomography images of the head/brain performed without intravenous contrast. Note: Per PQRS, the description of internal carotid artery percent stenosis, including 0 percent or normal exam, is based on [place] American Symptomatic Carotid Endarterectomy Trial (NASCET) criteria. Dose reduction technique was used including one or more of the following: automated exposure control, adjustment of mA and kV according to patient size, and/or iterative reconstruction. CONTRAST: With and without. 75 mL (iohexol  (OMNIPAQUE ) 350 MG/ML injection 75 mL IOHEXOL  350 MG/ML SOLN). COMPARISON: Brain MRI 08/22/2024 01:16 AM. Head CT 08/21/2024. FINDINGS: CT HEAD: BRAIN: Acute lacunar type infarct of the left corona radiata, basal ganglia. Patchy bilateral white matter hypodensity is stable. The left corona radiata acute ischemia is occult by CT. No acute intracranial hemorrhage or mass effect. Patchy chronic bilateral cerebellar infarcts redemonstrated. No mass lesion. No CT evidence for acute territorial  infarct. No midline shift or extra-axial collection. VENTRICLES: No hydrocephalus. ORBITS: The orbits are unremarkable. SINUSES AND MASTOIDS: The paranasal sinuses and mastoid air cells are clear. CTA NECK: COMMON CAROTID ARTERIES: Tortuous right CCA. Left CCA origin calcified plaque without stenosis. Tortuous left CCA. No dissection or occlusion. INTERNAL CAROTID ARTERIES: Bulky  calcified atherosclerosis at the right ICA origin. Proximal right ICA narrowed with estimated 68% stenosis. The right ICA remains patent and is highly tortuous distal to the bulb. Mildly tortuous right ICA siphon with mild to moderate calcified atherosclerosis but no siphon stenosis. Mild calcified plaque at the left carotid bifurcation without stenosis. Tortuous left ICA just below the skull base. Left ICA siphon moderate calcified plaque but no significant stenosis through the mid supraclinoid segment. However, Very Severe stenosis at the left ICA terminus, left MCA and ACA origins (series 15 image 21 and series 10 image 220. This more resembles atherosclerosis than thrombosis . VERTEBRAL ARTERIES: Normal right vertebral artery origin. Tortuous right V1 segment. Mildly tortuous right vertebral artery in the neck, and the right vertebral artery is mildly dominant throughout. Normal right vertebrobasilar junction. No right vertebral artery stenosis. Normal left vertebral artery origin. Tortuous left V1 segment. Tortuous mildly nondominant left vertebral artery with no stenosis to the left PICA origin. Left V4 segment mild stenosis to the vertebrobasilar junction. No dissection or occlusion. CTA HEAD: ANTERIOR CEREBRAL ARTERIES: Left ACA origin is functionally occluded. This more resembles atherosclerosis than thrombosis. Reconstituted left ACA A2 enhancement is normal. Mild bilateral ACA branch irregularity. Normal anterior communicating artery. No aneurysm. MIDDLE CEREBRAL ARTERIES: Left MCA origin remains patent but is severely stenotic.  This more resembles atherosclerosis than thrombosis. Left MCA M1 segment is mildly irregular. Patent left MCA bifurcation. No other left MCA branch stenosis. Similar mild to moderate irregularity of the right MCA M1 segment. Mild right MCA M1 stenosis. Patent right MCA bifurcation. No significant right MCA branch stenosis. No aneurysm. POSTERIOR CEREBRAL ARTERIES: Patent SCA and PCA origins. Moderate bilateral PCA branch irregularity. Severe right PCA P2 segment stenosis. Distal right PCA branches remain patent. Moderate to severe similar left PCA P2 and P3 segment stenosis, also without occlusion. The right PCA P2 segment stenosis is greater than the left PCA P2 segment stenosis. This more resembles atherosclerosis than thrombosis. No aneurysm. BASILAR ARTERY: Patent basilar artery without stenosis. No aneurysm. OTHER: 3 vessel aortic arch configuration. Calcified aortic arch atherosclerosis. Brachiocephalic artery atherosclerosis without stenosis. Mild right subclavian origin atherosclerosis without stenosis. Proximal left subclavian artery atherosclerosis without stenosis. Major dural venous sinuses are enhancing and appear to be patent. SOFT TISSUES: Negative nonvascular neck soft tissue spaces. Negative visible upper chest. BONES: Advanced cervical spine degeneration superimposed on degenerative appearing ankylosis of C2-C3. Multilevel degenerative cervical spondylolisthesis. Underlying mild cervicothoracic scoliosis. No acute osseous abnormality. IMPRESSION: 1. Negative for ELVO, but Positive for Severe intracranial atherosclerosis with CRITICAL stenoses at the Left ICA terminus, left MCA origin, and right > than left PCA P2 segments. Functionally occluded Left ACA origin, but left A2 reconstituted by the [facility]. 2. Right ICA origin in the neck high grade atherosclerotic stenosis estimated at 68%. 3. Acute lacunar infarct involving the left corona radiata and basal ganglia occult by CT. No new intracranial  abnormality. Electronically signed by: Helayne Hurst MD 08/22/2024 05:24 AM EST RP Workstation: [id number]   MR Brain W and Wo Contrast Result Date: 08/22/2024 EXAM: MRI BRAIN WITH AND WITHOUT CONTRAST 08/22/2024 01:33:50 AM TECHNIQUE: Multiplanar multisequence MRI of the head/brain was performed with and without the administration of 6 mL of gadobutrol  (GADAVIST ) 1 MMOL/ML intravenous contrast. COMPARISON: None available. CLINICAL HISTORY: Mental status change, unknown cause. FINDINGS: BRAIN AND VENTRICLES: Small focus of acute ischemia at the left caudothalamic junction. Multifocal hyperintense T2-weighted signal within the cerebral white matter, most commonly due to chronic small vessel disease.  Multiple cerebellar small vessel infarcts. No acute intracranial hemorrhage. No mass effect or midline shift. No hydrocephalus. The sella is unremarkable. Normal flow voids. No mass or abnormal enhancement. ORBITS: No acute abnormality. SINUSES: No acute abnormality. BONES AND SOFT TISSUES: Normal bone marrow signal and enhancement. No acute soft tissue abnormality. IMPRESSION: 1. Small focus of acute ischemia at the left caudothalamic junction. 2. Multiple chronic cerebellar small vessel infarcts. 3. Multifocal hyperintense T2-weighted signal within the cerebral white matter, most commonly due to chronic small vessel disease. Electronically signed by: Franky Stanford MD 08/22/2024 01:56 AM EST RP Workstation: [id number]   CT Head Wo Contrast Result Date: 08/21/2024 EXAM: CT HEAD WITHOUT 08/21/2024 11:27:00 PM TECHNIQUE: CT of the head was performed without the administration of intravenous contrast. Automated exposure control, iterative reconstruction, and/or weight based adjustment of the mA/kV was utilized to reduce the radiation dose to as low as reasonably achievable. COMPARISON: None available. CLINICAL HISTORY: confusion FINDINGS: BRAIN AND VENTRICLES: No acute intracranial hemorrhage. No mass effect or midline  shift. No extra-axial fluid collection. No evidence of acute infarct. No hydrocephalus. Remote left cerebellar infarcts. Global cortical atrophy. Subcortical and periventricular small vessel ischemic changes. Intracranial atherosclerosis. ORBITS: No acute abnormality. SINUSES AND MASTOIDS: No acute abnormality. SOFT TISSUES AND SKULL: No acute skull fracture. No acute soft tissue abnormality. IMPRESSION: 1. No acute intracranial abnormality. 2. Remote left cerebellar infarcts. Electronically signed by: Pinkie Pebbles MD 08/21/2024 11:35 PM EST RP Workstation: [id number]   DG Chest Portable 1 View Result Date: 08/21/2024 EXAM: 1 VIEW(S) XRAY OF THE CHEST 08/21/2024 11:13:00 PM COMPARISON: Treatment 04/11/2221. CLINICAL HISTORY: weakness FINDINGS: LUNGS AND PLEURA: No focal pulmonary opacity. No pleural effusion. No pneumothorax. HEART AND MEDIASTINUM: Aortic atherosclerosis. No acute abnormality of the cardiac and mediastinal silhouettes. BONES AND SOFT TISSUES: Multilevel thoracic osteophytosis. Dextroconvex thoracolumbar scoliosis. IMPRESSION: 1. No acute cardiopulmonary process identified. Electronically signed by: Greig Pique MD 08/21/2024 11:16 PM EST RP Workstation: [id number]    Scheduled Meds:   stroke: early stages of recovery book   Does not apply Once   amLODipine   5 mg Oral Daily   aspirin   300 mg Rectal Daily   Or   aspirin   325 mg Oral Daily   enoxaparin  (LOVENOX ) injection  40 mg Subcutaneous Q24H   levothyroxine   75 mcg Oral Q0600   melatonin  2.5 mg Oral QHS   rosuvastatin   20 mg Oral Daily   Continuous Infusions:   LOS: 2 days  MDM: Patient is high risk for one or more organ failure.  They necessitate ongoing hospitalization for continued IV therapies and subsequent lab monitoring. Total time spent interpreting labs and vitals, reviewing the medical record, coordinating care amongst consultants and care team members, directly assessing and discussing care with the patient  and/or family: 55 min  Jamilette Suchocki, DO [facility]  To contact the attending physician between 7A-7P please use Epic Chat. To contact the covering physician during after hours 7P-7A, please review Amion.  08/24/2024, 11:39 AM   *This document has been created with the assistance of dictation software. Please excuse typographical errors. *

## 2024-08-24 NOTE — Care Management Important Message (Signed)
 Important Message  Patient Details  Name: Wendy Love MRN: [id number] Date of Birth: May 14, 1941   Important Message Given:  Yes - Medicare IM     Wendy Love 08/24/2024, 2:53 PM

## 2024-08-24 NOTE — Progress Notes (Signed)
 Inpatient Rehab Admissions Coordinator:   Auth pending for CIR.  Will continue to follow.   Reche Lowers, PT, DPT Admissions Coordinator [phone] 08/24/2024 11:17 AM

## 2024-08-24 NOTE — TOC Initial Note (Signed)
 Transition of Care [facility]) - Initial/Assessment Note    Patient Details  Name: Wendy Love MRN: [id number] Date of Birth: 1941/08/25  Transition of Care [facility]) CM/SW Contact:    Alfonso Rummer, LCSW Phone Number: 08/24/2024, 5:13 PM  Clinical Narrative:                  KEN DELENA Rummer met with pt and pt daughter in room 240b. Pt is being considered for cir in . Pt daughter has questions regarding ins coverage and length of stay. Pt daughter encourage to contact ins company regarding ins coverage. However LCSW A Celestia Duva pt with information given such as out of pocket ins max of 4150.00. Length of stay is determined by evaluation that will be completed to cir. Pt daughter and pt verbalized understanding.        Patient Goals and CMS Choice            Expected Discharge Plan and Services                                              Prior Living Arrangements/Services                       Activities of Daily Living   ADL Screening (condition at time of admission) Independently performs ADLs?: Yes (appropriate for developmental age) Is the patient deaf or have difficulty hearing?: No Does the patient have difficulty seeing, even when wearing glasses/contacts?: No Does the patient have difficulty concentrating, remembering, or making decisions?: No  Permission Sought/Granted                  Emotional Assessment              Admission diagnosis:  Hypertensive urgency [I16.0] Hypertensive emergency [I16.1] Essential hypertension [I10] Altered mental status, unspecified altered mental status type [R41.82] Stroke [facility]) [I63.9] Patient Active Problem List   Diagnosis Date Noted   Chronic diastolic CHF (congestive heart failure) (HCC) 08/22/2024   Hypertensive urgency 08/22/2024   Stroke (HCC) 08/22/2024   Coronary artery disease due to calcified coronary lesion 11/07/2023   Lump of skin of right upper extremity 12/23/2022    Carotid stenosis 02/12/2022   Renovascular hypertension 11/09/2021   Personal history of COVID-19 10/11/2020   Hemorrhoid prolapse 09/21/2019   Osteopenia after menopause 02/16/2019   Fibromuscular dysplasia of renal artery 08/18/2018   Synovial plica syndrome of right knee 04/10/2018   Complex tear of lateral meniscus of right knee as current injury 03/06/2018   Autosomal dominant hereditary hemochromatosis 12/31/2017   Myalgia due to statin 12/30/2017   Peripheral artery disease 07/03/2017   Tinnitus 11/16/2013   Encounter for preventive health examination 11/30/2012   Postmenopausal atrophic vaginitis 11/21/2012   Hyperlipidemia 03/21/2010   UNEQUAL LEG LENGTH 02/07/2010   INSOMNIA, CHRONIC 12/19/2009   TREMOR 12/19/2009   Right-sided low back pain with right-sided sciatica 09/27/2008   Hypothyroidism 10/30/2007   SYMPTOMATIC MENOPAUSAL/FEMALE CLIMACTERIC STATES 10/30/2007   White coat syndrome with diagnosis of hypertension 07/30/2007   Osteoarthritis of spine 07/30/2007   PCP:  Marylynn Verneita CROME, MD Pharmacy:   [phone] - [place], [place] - [address] [place] [address] Phone: [phone] Fax: [phone]  [facility] - [place], [place] - [address] [place]  [place] [address] Phone: [phone] Fax: [phone]     Social Drivers of Health (SDOH) Social History: SDOH Screenings   Food Insecurity: No Food Insecurity (08/22/2024)  Housing: Low Risk (08/23/2024)  Transportation Needs: No Transportation Needs (08/23/2024)  Utilities: Not At Risk (08/22/2024)  Alcohol Screen: Low Risk (03/30/2024)  Depression (PHQ2-9): Low Risk (05/31/2024)  Financial Resource Strain: Low Risk (05/28/2024)  Physical Activity: Insufficiently Active (05/28/2024)  Social Connections: Moderately Integrated (08/22/2024)  Stress: No Stress Concern Present (05/28/2024)  Tobacco Use: Low Risk (08/17/2024)  Health Literacy: Adequate Health Literacy  (03/30/2024)   SDOH Interventions:     Readmission Risk Interventions     No data to display

## 2024-08-25 ENCOUNTER — Other Ambulatory Visit: Payer: Self-pay

## 2024-08-25 MED ORDER — ROSUVASTATIN CALCIUM 20 MG PO TABS
20.0000 mg | ORAL_TABLET | Freq: Every day | ORAL | 0 refills | Status: DC
  Filled 2024-08-25: qty 30, 30d supply, fill #0

## 2024-08-25 MED ORDER — CLOPIDOGREL BISULFATE 75 MG PO TABS
75.0000 mg | ORAL_TABLET | Freq: Every day | ORAL | 0 refills | Status: DC
  Filled 2024-08-25: qty 30, 30d supply, fill #0

## 2024-08-25 NOTE — Progress Notes (Signed)
 Occupational Therapy Treatment Patient Details Name: Wendy Love MRN: [id number] DOB: [birth year] Today's Date: 08/25/2024   History of present illness Wendy Love is a 83 y.o. female with medical history significant of whitecoat syndrome, ribromuscular dysplasia of renal artery, renovascular hypertension, hypothyroidism, hyperlipidemia, chronic diastolic CHF, insomnia, who presents with confusion. MRI showed small focus of acute ischemia at the left caudothalamic junction, multiple chronic cerebellar small vessel infarcts. CTA showed acute lacunar infarct involving the left corona radiata and basal ganglia.   OT comments  Pt making good progress towards goals. Session focused on ADL performance with pt continuing to demonstrate deficits in executive functioning, requiring mod - max cues throughout to effectively sequence BADL performance including problem-solving, task initiation, & organization. Pt requires supervision - mod independent for transfers & mobility, up to MIN A to correct posterior LOB while attempting to don pants standing. Recommend direct assist with med and financial mgmt due to cognitive deficits. OT will follow acutely. Pt will benefit from [facility] OT services, and anticipate continued need for outpatient OT to focus on higher level IADL tasks.       If plan is discharge home, recommend the following:  A little help with walking and/or transfers;A little help with bathing/dressing/bathroom;Help with stairs or ramp for entrance;Supervision due to cognitive status;Assist for transportation;Assistance with cooking/housework;Direct supervision/assist for financial management;Direct supervision/assist for medications management   Equipment Recommendations  Other (comment)       Precautions / Restrictions Precautions Precautions: Fall Recall of Precautions/Restrictions: Impaired Restrictions Weight Bearing Restrictions Per Provider Order: No       Mobility Bed  Mobility Overal bed mobility: Modified Independent                  Transfers Overall transfer level: Modified independent Equipment used: None Transfers: Sit to/from Stand                   Balance Overall balance assessment: Needs assistance Sitting-balance support: Feet supported, No upper extremity supported Sitting balance-Leahy Scale: Good     Standing balance support: During functional activity, No upper extremity supported Standing balance-Leahy Scale: Fair Standing balance comment: MIN A to correct posterior LOB                           ADL either performed or assessed with clinical judgement   ADL Overall ADL's : Needs assistance/impaired     Grooming: Wash/dry hands;Wash/dry face;Standing;Supervision/safety;Cueing for sequencing Grooming Details (indicate cue type and reason): cues for sequencing, task initation, organization Upper Body Bathing: Standing;Supervision/ safety;Cueing for safety;Cueing for sequencing Upper Body Bathing Details (indicate cue type and reason): mod verbal cues for task sequencing, turning on/off water at sink, location of task items Lower Body Bathing: Sit to/from stand;Supervison/ safety;Cueing for safety Lower Body Bathing Details (indicate cue type and reason): washes feet with figure four in standing (pt unaware of minor balance deficits and requires cues to redirect) Upper Body Dressing : Standing;Supervision/safety Upper Body Dressing Details (indicate cue type and reason): doffs gown, dons shirt. cues to problem-solve and locate items Lower Body Dressing: Minimal assistance;Sit to/from stand Lower Body Dressing Details (indicate cue type and reason): pt attempts to don pants standing, MIN A to correct posterior LOB. Edu on performing LB ADLs from a seated position             Functional mobility during ADLs: Supervision/safety General ADL Comments: supervision for 1 lap on unit, pt unable to navigate  way  back to her room without max verbal cues. pt with impaired attention, often pausing during mobility to process and remember task.     Communication Communication Communication: Impaired Factors Affecting Communication: Difficulty expressing self (improved communication deficits are still present)   Cognition Arousal: Alert Behavior During Therapy: Impulsive Cognition: Cognition impaired Difficult to assess due to: Impaired communication   Awareness: Intellectual awareness impaired, Online awareness impaired Memory impairment (select all impairments): Short-term memory, Working memory Attention impairment (select first level of impairment): Selective attention, Alternating attention, Divided attention Executive functioning impairment (select all impairments): Problem solving, Reasoning, Initiation, Organization OT - Cognition Comments: continues to disregard cues for safety awareness (question if processing vs dismissing intentionally), pt with poor executive functioning and unaware of deficits. unable to ask for help when sequencing ADLs                 Following commands: Impaired Following commands impaired: Follows one step commands inconsistently, Follows one step commands with increased time      Cueing   Cueing Techniques: Verbal cues, Gestural cues  Exercises Other Exercises Other Exercises: edu provided re: LB dressing from a seated position, recommendation for direct assist for med and financial mgmt, recommended to discuss returning to driving with MD to recieve clearance Other Exercises: discussed with pt and daughter [facility] OT vs Outpatient OT. Recommending participation in [facility] OT (anticipate briefly) and transitioning quickly to outpatient OT to focus on higher level IADL with executive functioning tasks Other Exercises: reviewed BE FAST with pt and daughter            Pertinent Vitals/ Pain       Pain Assessment Pain Assessment: No/denies pain          Frequency  Min 3X/week        Progress Toward Goals  OT Goals(current goals can now be found in the care plan section)  Progress towards OT goals: Progressing toward goals  Acute Rehab OT Goals OT Goal Formulation: With patient/family Time For Goal Achievement: 09/05/24 Potential to Achieve Goals: Good ADL Goals Pt Will Perform Grooming: standing;with modified independence Pt Will Perform Upper Body Dressing: standing;with modified independence Pt Will Perform Lower Body Dressing: sit to/from stand;with modified independence Pt Will Transfer to Toilet: regular height toilet;with modified independence;ambulating Pt Will Perform Toileting - Clothing Manipulation and hygiene: with modified independence;sit to/from stand;with adaptive equipment  Plan      Co-evaluation                 AM-PAC OT 6 Clicks Daily Activity     Outcome Measure   Help from another person eating meals?: A Little Help from another person taking care of personal grooming?: A Little Help from another person toileting, which includes using toliet, bedpan, or urinal?: A Little Help from another person bathing (including washing, rinsing, drying)?: A Little Help from another person to put on and taking off regular upper body clothing?: A Little Help from another person to put on and taking off regular lower body clothing?: A Little 6 Click Score: 18    End of Session    OT Visit Diagnosis: Other abnormalities of gait and mobility (R26.89);Cognitive communication deficit (R41.841) Symptoms and signs involving cognitive functions: Cerebral infarction   Activity Tolerance Patient tolerated treatment well   Patient Left in bed;with call bell/phone within reach;with family/visitor present   Nurse Communication Mobility status        Time: 9041-8955 OT Time Calculation (min): 46 min  Charges: OT General Charges $OT Visit: 1 Visit OT Treatments $Self Care/Home Management : 38-52  mins  Gustava Berland L. Araiya Tilmon, OTR/L  08/25/2024, 1:13 PM

## 2024-08-25 NOTE — Progress Notes (Signed)
 Inpatient Rehab Admissions Coordinator:   Notified by insurance that request for CIR has been denied as pt mobilizing very well.  I updated pt's daughter and she is in agreement to pursue discharge home with recommendations from PT/OT for DME and f/u therapy.  I will sign off at this time.  TOC and MD aware.   Reche Lowers, PT, DPT Admissions Coordinator [phone] 08/25/2024 10:23 AM

## 2024-08-25 NOTE — TOC CM/SW Note (Signed)
 The beneficiary has a mobility limitation that significantly impairs his/her ability to participate in one or more mobility-related activities of daily living (MRADL) in the home. The patient is able to safely use the walker. The functional mobility deficit can be sufficiently resolved by use of walker.

## 2024-08-25 NOTE — TOC CM/SW Note (Signed)
 Patient has mobility impairment with getting out of tub and shower. A tub bench will alleviate that.

## 2024-08-25 NOTE — TOC Progression Note (Signed)
 Transition of Care [facility]) - Progression Note    Patient Details  Name: Wendy Love MRN: [id number] Date of Birth: [birth year]  Transition of Care [facility]) CM/SW Contact  Shasta DELENA Daring, RN Phone Number: 08/25/2024, 10:16 AM  Clinical Narrative:    RNCM notified by MD that patient will discharge home with her daughter instead of [facility].  Called daughter to confirm address and initiated [facility] search.  Services requested include PT, OT, SLT and aide.  Will monitor.                    Expected Discharge Plan and Services                                               Social Drivers of Health (SDOH) Interventions SDOH Screenings   Food Insecurity: No Food Insecurity (08/22/2024)  Housing: Low Risk (08/23/2024)  Transportation Needs: No Transportation Needs (08/23/2024)  Utilities: Not At Risk (08/22/2024)  Alcohol Screen: Low Risk (03/30/2024)  Depression (PHQ2-9): Low Risk (05/31/2024)  Financial Resource Strain: Low Risk (05/28/2024)  Physical Activity: Insufficiently Active (05/28/2024)  Social Connections: Moderately Integrated (08/22/2024)  Stress: No Stress Concern Present (05/28/2024)  Tobacco Use: Low Risk (08/17/2024)  Health Literacy: Adequate Health Literacy (03/30/2024)    Readmission Risk Interventions     No data to display

## 2024-08-25 NOTE — Progress Notes (Signed)
 AVS given and reviewed with patient. PIV removed. All questions answered

## 2024-08-25 NOTE — TOC CM/SW Note (Signed)
 Patient is not able to walk the distance required to go the bathroom, or he/she is unable to safely negotiate stairs required to access the bathroom.  A 3in1 BSC will alleviate this problem

## 2024-08-25 NOTE — TOC Transition Note (Signed)
 Transition of Care [facility]) - Discharge Note   Patient Details  Name: Wendy Love MRN: [id number] Date of Birth: Jan 15, 1941  [facility]) CM/SW Contact:  Shasta DELENA Daring, RN Phone Number: 08/25/2024, 4:34 PM   Clinical Narrative:    Patient has discharge order. Will discharge home to daughter's house. HH PT, OT, RN, SLT and aide arranged with [facility], confirmed with Agency rep. DME ordered rollator, bedside commode, tub bench, via [employer], confirmed via agency rep. Address to daughters house providedd:  [address], [place], [place] [address].  No additional TOC needs.   RNCM signig off.   Final next level of care: Home w Home Health Services Barriers to Discharge: Barriers Resolved   Patient Goals and CMS Choice     Choice offered to / list presented to : Patient, Adult Children      Discharge Placement                       Discharge Plan and Services Additional resources added to the After Visit Summary for                  DME Arranged: Walker rolling with seat, Bedside commode, Tub bench DME Agency: [employer] Date DME Agency Contacted: 08/25/24 Time DME Agency Contacted: 1627 Representative spoke with at DME Agency: Mitch HH Arranged: PT, OT, RN, Speech Therapy, Nurse's Aide HH Agency: [facility] Date [facility] Agency Contacted: 08/25/24 Time HH Agency Contacted: 0430 Representative spoke with at [facility] Agency: Kelcie  Social Drivers of Health (SDOH) Interventions SDOH Screenings   Food Insecurity: No Food Insecurity (08/22/2024)  Housing: Low Risk (08/23/2024)  Transportation Needs: No Transportation Needs (08/23/2024)  Utilities: Not At Risk (08/22/2024)  Alcohol Screen: Low Risk (03/30/2024)  Depression (PHQ2-9): Low Risk (05/31/2024)  Financial Resource Strain: Low Risk (05/28/2024)  Physical Activity: Insufficiently Active (05/28/2024)  Social Connections: Moderately Integrated (08/22/2024)  Stress: No Stress Concern Present  (05/28/2024)  Tobacco Use: Low Risk (08/17/2024)  Health Literacy: Adequate Health Literacy (03/30/2024)     Readmission Risk Interventions     No data to display

## 2024-08-25 NOTE — Progress Notes (Signed)
 Patient and family member requesting bed alarm be turned off so she can help patient ambulate and go to the bathroom. Educated patient and family member on bed alarm. Voiced understanding, but still requesting it be turned off and family member will notify nurse if she leaves so alarm can be turned back on.

## 2024-08-25 NOTE — Discharge Summary (Signed)
 DISCHARGE SUMMARY    Wendy Love FMW:[id number] DOB: 07/25/1941 DOA: 08/21/2024  PCP: Marylynn Verneita CROME, MD  Admit date: 08/21/2024 Discharge date: 08/25/2024   Recommendations for Outpatient Follow-up:  Follow up with PCP in 1-2  weeks to continue chronic condition management Referral to Neurology for post-stroke care  Hospital Course: Wendy Love is an 83yoM with fibromuscular dysplasia, renovascular hypertension, hypothyroidism, hyperlipidemia, chronic basilar CHF, insomnia, who presents with new confusion and word salad.  MRI confirmed acute CVA with ischemia at the left caudothalamic junction and multiple chronic cerebellar small vessel infarcts.  She was admitted for stroke workup.  CTA  head and neck revealed severe left ICA terminus, left MCA origin, bilateral PCA P2 stenosis.  Left ACA is functionally occluded. Echo EF 55-60% with no source of cardiac emboli.  LDL 123. Hemoglobin A1c 4.8%, TSH WNL. She was started on low-dose statin, aspirin  and Plavix .  She was originally pending placement at [facility] inpatient rehab but while awaiting insurance authorization had significant improvement with therapy services and was ultimately declined by insurance.  After discussion with the patient and her daughter they have opted to proceed with home health at discharge.  TOC was consulted to assist in these arrangements.  Acute CVA, left caudothalamic junction infarct - CTA head and neck with severe left ICA, left MCA, and left PCA disease.  Left ACA functionally occluded. - Patient was on aspirin  prior to admission.  Will proceed with aspirin  and Plavix  now - Hemoglobin A1c at goal 4.8% - LDL 123, has history of myalgias on statin.  Have started with low-dose and will titrate up as tolerated over the coming weeks - Continue monitoring on telemetry, no events so far - EF 55 to 60%, grade 1 diastolic dysfunction, no cardiac source of emboli - Blood pressure at goal with home  meds - Outpatient follow-up with vascular surgery -Home health PT/OT/SLP/HHA ordered.  TOC consulted for arrangement   Fibromuscular dysplasia Renovascular hypertension - Resume all home meds   Hyperlipidemia - Gradually reintroducing statin, does have history of myalgias.   Chronic diastolic CHF - EF 55 to 60%, grade 1 diastolic dysfunction.  Clinically euvolemic at this time.  Monitor volume status - Have discussed with her daughter    Hypothyroidism - Resume home dose synthroid . TSH WNL    Discharge Instructions  Discharge Instructions     Ambulatory referral to Neurology   Complete by: As directed    Hospital follow up. Post stroke.  Fibromuscular dysplasia   Call MD for:  difficulty breathing, headache or visual disturbances   Complete by: As directed    Call MD for:  persistant dizziness or light-headedness   Complete by: As directed    Call MD for:  persistant nausea and vomiting   Complete by: As directed    Call MD for:  severe uncontrolled pain   Complete by: As directed    Call MD for:  temperature >100.4   Complete by: As directed    Diet general   Complete by: As directed    Discharge instructions   Complete by: As directed    Follow up with your primary care physician to discuss the medication changes during this admission. See Neurology for continues post-stroke care.   Increase activity slowly   Complete by: As directed       Allergies as of 08/25/2024       Reactions   Atorvastatin  Other (See Comments)   myalgias   Brompheniramine-pseudoeph  REACTION: agitation, nervous, sleepless   Codeine    REACTION: GI Upset   Diphenhydramine  Hcl    REACTION: nervous, sleepless, agitated   Hctz [hydrochlorothiazide ] Other (See Comments)   Severe hyponatremia   Loratadine    REACTION: nervous, sleeplessness   Other Other (See Comments)   Tape left burn-type marks on face after Cataract surgery   Tape    Tape left burn-type marks on face after  Cataract surgery   Triamterene     Hyperkalemia and hyponatremia   Tramadol  Hcl Rash   hyperactivity        Medication List     STOP taking these medications    QUERCETIN PO   tretinoin 0.025 % cream Commonly known as: RETIN-A       TAKE these medications    amLODipine  5 MG tablet Commonly known as: NORVASC  TAKE 1 TABLET (5 MG TOTAL) BY MOUTH DAILY.   ascorbic acid 500 MG tablet Commonly known as: VITAMIN C Take 1,000 mg by mouth daily.   Aspirin  Low Dose 81 MG tablet Generic drug: aspirin  EC TAKE 1 TABLET BY MOUTH EVERY DAY   b complex vitamins tablet Take 1 tablet by mouth daily.   carvedilol  3.125 MG tablet Commonly known as: COREG  Take 1 tablet (3.125 mg total) by mouth 2 (two) times daily with a meal.   clopidogrel  75 MG tablet Commonly known as: Plavix  Take 1 tablet (75 mg total) by mouth daily.   Co Q-10 100 MG Caps Take by mouth daily.   FISH OIL PO Take by mouth daily.   levothyroxine  75 MCG tablet Commonly known as: SYNTHROID  TAKE 1 TABLET BY MOUTH EVERY DAY   Magnesium  300 MG Caps Take by mouth daily.   melatonin 1 MG Tabs tablet Take 2 mg by mouth at bedtime.   multivitamin tablet Take 1 tablet by mouth daily.   Probiotic Caps Take by mouth daily.   QC TUMERIC COMPLEX PO Take by mouth.   rosuvastatin  20 MG tablet Commonly known as: CRESTOR  Take 1 tablet (20 mg total) by mouth daily. Start taking on: August 26, 2024   temazepam  15 MG capsule Commonly known as: RESTORIL  Take 1 capsule (15 mg total) by mouth at bedtime as needed for sleep.   valsartan  160 MG tablet Commonly known as: DIOVAN  TAKE 1 TABLET BY MOUTH TWICE A DAY   Vitamin D3 50 MCG (2000 UT) Tabs Take 5,000 Units by mouth daily.   Zinc 50 MG Tabs Take 50 mg by mouth daily.               Durable Medical Equipment  (From admission, onward)           Start     Ordered   08/25/24 1019  For home use only DME Tub bench  Once        08/25/24  1018   08/25/24 1018  For home use only DME Bedside commode  Once       Question:  Patient needs a bedside commode to treat with the following condition  Answer:  Gait disturbance   08/25/24 1018   08/25/24 1018  For home use only DME 4 wheeled rolling walker with seat  Once       Question:  Patient needs a walker to treat with the following condition  Answer:  Gait disturbance   08/25/24 1018            Allergies[1]  Consultations:    Procedures/Studies: VAS US  RENAL ARTERY  DUPLEX Result Date: 08/23/2024 ABDOMINAL VISCERAL Patient Name:  Wendy Love  Date of Exam:   08/17/2024 Medical Rec #: [id number]             Accession #:    [id number] Date of Birth: 03/16/1941             Patient Gender: F Patient Age:   60 years Exam Location:  [facility] Procedure:      VAS US  RENAL ARTERY DUPLEX Referring Phys: [id number] JASON S DEW -------------------------------------------------------------------------------- High Risk Factors: Hypertension, no history of smoking, coronary artery disease. Other Factors: Fibromuscular dysplasia, renal artery. Vascular Interventions: 10/25/2021 PTA of right renal artery. Performing Technologist: Donnice Charnley RVT  Examination Guidelines: A complete evaluation includes B-mode imaging, spectral Doppler, color Doppler, and power Doppler as needed of all accessible portions of each vessel. Bilateral testing is considered an integral part of a complete examination. Limited examinations for reoccurring indications may be performed as noted.  Duplex Findings: +------------+--------+--------+------+--------+ Mesenteric  PSV cm/sEDV cm/sPlaqueComments +------------+--------+--------+------+--------+ Aorta Prox     77                          +------------+--------+--------+------+--------+ SMA Proximal  236                          +------------+--------+--------+------+--------+    +------------------+--------+--------+-------+ Right  Renal ArteryPSV cm/sEDV cm/sComment +------------------+--------+--------+-------+ Origin               92      21           +------------------+--------+--------+-------+ Proximal             90      21           +------------------+--------+--------+-------+ Mid                 107      29           +------------------+--------+--------+-------+ Distal               69      18           +------------------+--------+--------+-------+ +-----------------+--------+--------+-------+ Left Renal ArteryPSV cm/sEDV cm/sComment +-----------------+--------+--------+-------+ Origin             112      21           +-----------------+--------+--------+-------+ Proximal           125      29           +-----------------+--------+--------+-------+ Mid                 86      19           +-----------------+--------+--------+-------+ Distal              68      17           +-----------------+--------+--------+-------+ +------------+--------+--------+----+-----------+--------+--------+----+ Right KidneyPSV cm/sEDV cm/sRI  Left KidneyPSV cm/sEDV cm/sRI   +------------+--------+--------+----+-----------+--------+--------+----+ Upper Pole                      Upper Pole                      +------------+--------+--------+----+-----------+--------+--------+----+ Mid         54      12      0.  77      14      0.82 +------------+--------+--------+----+-----------+--------+--------+----+ Lower Pole                      Lower Pole                      +------------+--------+--------+----+-----------+--------+--------+----+ Hilar                           Hilar                           +------------+--------+--------+----+-----------+--------+--------+----+ +------------------+-------+------------------+-------+ Right Kidney             Left Kidney               +------------------+-------+------------------+-------+ RAR                       RAR                       +------------------+-------+------------------+-------+ RAR (manual)             RAR (manual)              +------------------+-------+------------------+-------+ Cortex                   Cortex                    +------------------+-------+------------------+-------+ Cortex thickness  1.28 mmCorex thickness   1.14 mm +------------------+-------+------------------+-------+ Kidney length (cm)9.80   Kidney length (cm)9.40    +------------------+-------+------------------+-------+  Summary: Renal:  Right: Normal size right kidney. No evidence of right renal artery        stenosis. RRV flow present. Left:  Normal size of left kidney. No evidence of left renal artery        stenosis. LRV flow present.  *See table(s) above for measurements and observations.  Diagnosing physician: Selinda Gu MD  Electronically signed by Selinda Gu MD on 08/23/2024 at 8:23:23 AM.    Final    VAS US  CAROTID Result Date: 08/23/2024 Carotid Arterial Duplex Study Patient Name:  Wendy Love  Date of Exam:   08/17/2024 Medical Rec #: [id number]             Accession #:    [id number] Date of Birth: [birth year]             Patient Gender: F Patient Age:   73 years Exam Location:  [facility] Procedure:      VAS US  CAROTID Referring Phys: SELINDA GU --------------------------------------------------------------------------------  Risk Factors:  Hypertension, no history of smoking. Other Factors: Fibromuscular dysplasia. Performing Technologist: Donnice Charnley RVT  Examination Guidelines: A complete evaluation includes B-mode imaging, spectral Doppler, color Doppler, and power Doppler as needed of all accessible portions of each vessel. Bilateral testing is considered an integral part of a complete examination. Limited examinations for reoccurring indications may be performed as noted.  Right Carotid Findings:  +----------+--------+--------+--------+------------------+--------+           PSV cm/sEDV cm/sStenosisPlaque DescriptionComments +----------+--------+--------+--------+------------------+--------+ CCA Prox  104     19                                         +----------+--------+--------+--------+------------------+--------+ CCA Mid  78      17                                         +----------+--------+--------+--------+------------------+--------+ CCA Distal76      22                                         +----------+--------+--------+--------+------------------+--------+ ICA Prox  113     32      Normal                             +----------+--------+--------+--------+------------------+--------+ ICA Mid   128     33                                tortuous +----------+--------+--------+--------+------------------+--------+ ICA Distal151     28                                tortuous +----------+--------+--------+--------+------------------+--------+ ECA       90      0                                          +----------+--------+--------+--------+------------------+--------+ +----------+--------+-------+----------------+-------------------+           PSV cm/sEDV cmsDescribe        Arm Pressure (mmHG) +----------+--------+-------+----------------+-------------------+ [id number]             Multiphasic, WNL                    +----------+--------+-------+----------------+-------------------+ +---------+--------+--+--------+--+---------+ VertebralPSV cm/s57EDV cm/s16Antegrade +---------+--------+--+--------+--+---------+ Elevated velocities seen in the distal ICA without significant plaque burden. Cannot rule out tortuosity vs FMD. Left Carotid Findings: +----------+--------+--------+--------+------------------+--------+           PSV cm/sEDV cm/sStenosisPlaque DescriptionComments  +----------+--------+--------+--------+------------------+--------+ CCA Prox  71      15                                         +----------+--------+--------+--------+------------------+--------+ CCA Mid   63      9                                          +----------+--------+--------+--------+------------------+--------+ CCA Distal61      13      <50%    calcific and focal         +----------+--------+--------+--------+------------------+--------+ ICA Prox  70      24      Normal                             +----------+--------+--------+--------+------------------+--------+ ICA Mid   71      16                                         +----------+--------+--------+--------+------------------+--------+  ICA Distal81      26                                         +----------+--------+--------+--------+------------------+--------+ ECA       81      0                                          +----------+--------+--------+--------+------------------+--------+ +----------+--------+--------+----------------+-------------------+           PSV cm/sEDV cm/sDescribe        Arm Pressure (mmHG) +----------+--------+--------+----------------+-------------------+ Dlarojcpjw15              Multiphasic, WNL                    +----------+--------+--------+----------------+-------------------+ +---------+--------+--+--------+--+---------+ VertebralPSV cm/s49EDV cm/s17Antegrade +---------+--------+--+--------+--+---------+   Summary: Right Carotid: Velocities in the right ICA are consistent with a 1-39% stenosis.                The ECA appears >50% stenosed. Tortuous distal right ICA. Left Carotid: Non-hemodynamically significant plaque <50% noted in the CCA. Vertebrals:  Bilateral vertebral arteries demonstrate antegrade flow. Subclavians: Normal flow hemodynamics were seen in bilateral subclavian              arteries. *See table(s) above for measurements and  observations.  Electronically signed by Selinda Gu MD on 08/23/2024 at 8:21:46 AM.    Final    ECHOCARDIOGRAM COMPLETE Result Date: 08/22/2024    ECHOCARDIOGRAM REPORT   Patient Name:   Wendy Love Date of Exam: 08/22/2024 Medical Rec #:  [id number]            Height:       62.0 in Accession #:    [id number]           Weight:       128.0 lb Date of Birth:  [birth year]            BSA:          1.581 m Patient Age:    83 years             BP:           144/69 mmHg Patient Gender: F                    HR:           65 bpm. Exam Location:  [facility] Procedure: 2D Echo, Cardiac Doppler and Color Doppler (Both Spectral and Color            Flow Doppler were utilized during procedure). Indications:     Stroke I63.9  History:         Patient has prior history of Echocardiogram examinations. Risk                  Factors:Hypertension. Cancer.  Sonographer:     Bari Roar Referring Phys:  [id number] XILIN NIU Diagnosing Phys: Shelda Bruckner MD IMPRESSIONS  1. Left ventricular ejection fraction, by estimation, is 55 to 60%. The left ventricle has normal function. The left ventricle has no regional wall motion abnormalities. There is mild concentric left ventricular hypertrophy. Left ventricular diastolic parameters are consistent with Grade I diastolic dysfunction (impaired relaxation).  2. Right ventricular systolic function is normal. The  right ventricular size is normal. There is normal pulmonary artery systolic pressure.  3. The mitral valve is normal in structure. Trivial mitral valve regurgitation. No evidence of mitral stenosis.  4. The aortic valve is tricuspid. Aortic valve regurgitation is not visualized. No aortic stenosis is present.  5. The inferior vena cava is dilated in size with >50% respiratory variability, suggesting right atrial pressure of 8 mmHg. Comparison(s): No significant change from prior study. Conclusion(s)/Recommendation(s): Otherwise normal echocardiogram, with minor abnormalities  described in the report. FINDINGS  Left Ventricle: Left ventricular ejection fraction, by estimation, is 55 to 60%. The left ventricle has normal function. The left ventricle has no regional wall motion abnormalities. The left ventricular internal cavity size was normal in size. There is  mild concentric left ventricular hypertrophy. Left ventricular diastolic parameters are consistent with Grade I diastolic dysfunction (impaired relaxation). Right Ventricle: The right ventricular size is normal. No increase in right ventricular wall thickness. Right ventricular systolic function is normal. There is normal pulmonary artery systolic pressure. The tricuspid regurgitant velocity is 2.57 m/s, and  with an assumed right atrial pressure of 8 mmHg, the estimated right ventricular systolic pressure is 34.4 mmHg. Left Atrium: Left atrial size was normal in size. Right Atrium: Right atrial size was normal in size. Pericardium: There is no evidence of pericardial effusion. Mitral Valve: The mitral valve is normal in structure. Trivial mitral valve regurgitation. No evidence of mitral valve stenosis. MV peak gradient, 5.2 mmHg. The mean mitral valve gradient is 2.0 mmHg. Tricuspid Valve: The tricuspid valve is normal in structure. Tricuspid valve regurgitation is mild . No evidence of tricuspid stenosis. Aortic Valve: The aortic valve is tricuspid. Aortic valve regurgitation is not visualized. No aortic stenosis is present. Aortic valve mean gradient measures 2.0 mmHg. Aortic valve peak gradient measures 4.8 mmHg. Aortic valve area, by VTI measures 1.98 cm. Pulmonic Valve: The pulmonic valve was grossly normal. Pulmonic valve regurgitation is mild. No evidence of pulmonic stenosis. Aorta: The aortic root, ascending aorta and aortic arch are all structurally normal, with no evidence of dilitation or obstruction. Venous: The inferior vena cava is dilated in size with greater than 50% respiratory variability, suggesting right  atrial pressure of 8 mmHg. IAS/Shunts: The atrial septum is grossly normal.  LEFT VENTRICLE PLAX 2D LVIDd:         4.00 cm     Diastology LVIDs:         2.80 cm     LV e' medial:    5.22 cm/s LV PW:         1.30 cm     LV E/e' medial:  12.4 LV IVS:        1.20 cm     LV e' lateral:   5.22 cm/s LVOT diam:     1.70 cm     LV E/e' lateral: 12.4 LV SV:         38 LV SV Index:   24 LVOT Area:     2.27 cm  LV Volumes (MOD) LV vol d, MOD A2C: 43.1 ml LV vol d, MOD A4C: 46.9 ml LV vol s, MOD A2C: 18.8 ml LV vol s, MOD A4C: 18.6 ml LV SV MOD A2C:     24.3 ml LV SV MOD A4C:     46.9 ml LV SV MOD BP:      26.9 ml RIGHT VENTRICLE RV Basal diam:  2.70 cm RV Mid diam:    2.30 cm RV S prime:  12.80 cm/s TAPSE (M-mode): 2.4 cm LEFT ATRIUM             Index        RIGHT ATRIUM          Index LA diam:        3.60 cm 2.28 cm/m   RA Area:     9.44 cm LA Vol (A2C):   31.7 ml 20.05 ml/m  RA Volume:   16.00 ml 10.12 ml/m LA Vol (A4C):   30.1 ml 19.03 ml/m LA Biplane Vol: 30.8 ml 19.48 ml/m  AORTIC VALVE                    PULMONIC VALVE AV Area (Vmax):    1.65 cm     PV Vmax:          1.07 m/s AV Area (Vmean):   1.68 cm     PV Peak grad:     4.6 mmHg AV Area (VTI):     1.98 cm     PR End Diast Vel: 7.29 msec AV Vmax:           110.00 cm/s  RVOT Peak grad:   2 mmHg AV Vmean:          70.600 cm/s AV VTI:            0.191 m AV Peak Grad:      4.8 mmHg AV Mean Grad:      2.0 mmHg LVOT Vmax:         79.80 cm/s LVOT Vmean:        52.200 cm/s LVOT VTI:          0.167 m LVOT/AV VTI ratio: 0.87  AORTA Ao Root diam: 2.20 cm Ao Asc diam:  2.50 cm MITRAL VALVE               TRICUSPID VALVE MV Area VTI:  1.80 cm     TR Peak grad:   26.4 mmHg MV Peak grad: 5.2 mmHg     TR Vmax:        257.00 cm/s MV Mean grad: 2.0 mmHg MV Vmax:      1.14 m/s     SHUNTS MV Vmean:     68.5 cm/s    Systemic VTI:  0.17 m MV E velocity: 64.50 cm/s  Systemic Diam: 1.70 cm MV A velocity: 99.00 cm/s MV E/A ratio:  0.65 MV A Prime:    10.0 cm/s Shelda Bruckner MD Electronically signed by Shelda Bruckner MD Signature Date/Time: 08/22/2024/11:04:05 AM    Final    CT ANGIO HEAD NECK W WO CM Addendum Date: 08/22/2024 ** ADDENDUM #1 ADDENDUM: Salient findings relayed by telephone to Dr. Lawence at 5:41 AM on 08/22/2024. ---------------------------------------------------- Electronically signed by: Helayne Hurst MD 08/22/2024 05:50 AM EST RP Workstation: [id number]   Result Date: 08/22/2024 **ORIGINAL REPORT * EXAM: CTA Head and Neck with Intravenous Contrast. CT Head without Contrast. CLINICAL HISTORY: 83 year old female with acute lacunar infarct of left corona radiata, basal ganglia. Stroke/TIA, determine embolic source. TECHNIQUE: Axial CTA images of the head and neck performed with intravenous contrast. MIP reconstructed images were created and reviewed. Axial computed tomography images of the head/brain performed without intravenous contrast. Note: Per PQRS, the description of internal carotid artery percent stenosis, including 0 percent or normal exam, is based on [employer] American Symptomatic Carotid Endarterectomy Trial (NASCET) criteria. Dose reduction technique was used including one or more of the following: automated exposure control,  adjustment of mA and kV according to patient size, and/or iterative reconstruction. CONTRAST: With and without. 75 mL (iohexol  (OMNIPAQUE ) 350 MG/ML injection 75 mL IOHEXOL  350 MG/ML SOLN). COMPARISON: Brain MRI 08/22/2024 01:16 AM. Head CT 08/21/2024. FINDINGS: CT HEAD: BRAIN: Acute lacunar type infarct of the left corona radiata, basal ganglia. Patchy bilateral white matter hypodensity is stable. The left corona radiata acute ischemia is occult by CT. No acute intracranial hemorrhage or mass effect. Patchy chronic bilateral cerebellar infarcts redemonstrated. No mass lesion. No CT evidence for acute territorial infarct. No midline shift or extra-axial collection. VENTRICLES: No hydrocephalus. ORBITS: The orbits are  unremarkable. SINUSES AND MASTOIDS: The paranasal sinuses and mastoid air cells are clear. CTA NECK: COMMON CAROTID ARTERIES: Tortuous right CCA. Left CCA origin calcified plaque without stenosis. Tortuous left CCA. No dissection or occlusion. INTERNAL CAROTID ARTERIES: Bulky calcified atherosclerosis at the right ICA origin. Proximal right ICA narrowed with estimated 68% stenosis. The right ICA remains patent and is highly tortuous distal to the bulb. Mildly tortuous right ICA siphon with mild to moderate calcified atherosclerosis but no siphon stenosis. Mild calcified plaque at the left carotid bifurcation without stenosis. Tortuous left ICA just below the skull base. Left ICA siphon moderate calcified plaque but no significant stenosis through the mid supraclinoid segment. However, Very Severe stenosis at the left ICA terminus, left MCA and ACA origins (series 15 image 21 and series 10 image 220. This more resembles atherosclerosis than thrombosis . VERTEBRAL ARTERIES: Normal right vertebral artery origin. Tortuous right V1 segment. Mildly tortuous right vertebral artery in the neck, and the right vertebral artery is mildly dominant throughout. Normal right vertebrobasilar junction. No right vertebral artery stenosis. Normal left vertebral artery origin. Tortuous left V1 segment. Tortuous mildly nondominant left vertebral artery with no stenosis to the left PICA origin. Left V4 segment mild stenosis to the vertebrobasilar junction. No dissection or occlusion. CTA HEAD: ANTERIOR CEREBRAL ARTERIES: Left ACA origin is functionally occluded. This more resembles atherosclerosis than thrombosis. Reconstituted left ACA A2 enhancement is normal. Mild bilateral ACA branch irregularity. Normal anterior communicating artery. No aneurysm. MIDDLE CEREBRAL ARTERIES: Left MCA origin remains patent but is severely stenotic. This more resembles atherosclerosis than thrombosis. Left MCA M1 segment is mildly irregular. Patent left  MCA bifurcation. No other left MCA branch stenosis. Similar mild to moderate irregularity of the right MCA M1 segment. Mild right MCA M1 stenosis. Patent right MCA bifurcation. No significant right MCA branch stenosis. No aneurysm. POSTERIOR CEREBRAL ARTERIES: Patent SCA and PCA origins. Moderate bilateral PCA branch irregularity. Severe right PCA P2 segment stenosis. Distal right PCA branches remain patent. Moderate to severe similar left PCA P2 and P3 segment stenosis, also without occlusion. The right PCA P2 segment stenosis is greater than the left PCA P2 segment stenosis. This more resembles atherosclerosis than thrombosis. No aneurysm. BASILAR ARTERY: Patent basilar artery without stenosis. No aneurysm. OTHER: 3 vessel aortic arch configuration. Calcified aortic arch atherosclerosis. Brachiocephalic artery atherosclerosis without stenosis. Mild right subclavian origin atherosclerosis without stenosis. Proximal left subclavian artery atherosclerosis without stenosis. Major dural venous sinuses are enhancing and appear to be patent. SOFT TISSUES: Negative nonvascular neck soft tissue spaces. Negative visible upper chest. BONES: Advanced cervical spine degeneration superimposed on degenerative appearing ankylosis of C2-C3. Multilevel degenerative cervical spondylolisthesis. Underlying mild cervicothoracic scoliosis. No acute osseous abnormality. IMPRESSION: 1. Negative for ELVO, but Positive for Severe intracranial atherosclerosis with CRITICAL stenoses at the Left ICA terminus, left MCA origin, and right > than left PCA P2  segments. Functionally occluded Left ACA origin, but left A2 reconstituted by the [employer]comm. 2. Right ICA origin in the neck high grade atherosclerotic stenosis estimated at 68%. 3. Acute lacunar infarct involving the left corona radiata and basal ganglia occult by CT. No new intracranial abnormality. Electronically signed by: Helayne Hurst MD 08/22/2024 05:24 AM EST RP Workstation: [id number]    MR Brain W and Wo Contrast Result Date: 08/22/2024 EXAM: MRI BRAIN WITH AND WITHOUT CONTRAST 08/22/2024 01:33:50 AM TECHNIQUE: Multiplanar multisequence MRI of the head/brain was performed with and without the administration of 6 mL of gadobutrol  (GADAVIST ) 1 MMOL/ML intravenous contrast. COMPARISON: None available. CLINICAL HISTORY: Mental status change, unknown cause. FINDINGS: BRAIN AND VENTRICLES: Small focus of acute ischemia at the left caudothalamic junction. Multifocal hyperintense T2-weighted signal within the cerebral white matter, most commonly due to chronic small vessel disease. Multiple cerebellar small vessel infarcts. No acute intracranial hemorrhage. No mass effect or midline shift. No hydrocephalus. The sella is unremarkable. Normal flow voids. No mass or abnormal enhancement. ORBITS: No acute abnormality. SINUSES: No acute abnormality. BONES AND SOFT TISSUES: Normal bone marrow signal and enhancement. No acute soft tissue abnormality. IMPRESSION: 1. Small focus of acute ischemia at the left caudothalamic junction. 2. Multiple chronic cerebellar small vessel infarcts. 3. Multifocal hyperintense T2-weighted signal within the cerebral white matter, most commonly due to chronic small vessel disease. Electronically signed by: Franky Stanford MD 08/22/2024 01:56 AM EST RP Workstation: [id number]   CT Head Wo Contrast Result Date: 08/21/2024 EXAM: CT HEAD WITHOUT 08/21/2024 11:27:00 PM TECHNIQUE: CT of the head was performed without the administration of intravenous contrast. Automated exposure control, iterative reconstruction, and/or weight based adjustment of the mA/kV was utilized to reduce the radiation dose to as low as reasonably achievable. COMPARISON: None available. CLINICAL HISTORY: confusion FINDINGS: BRAIN AND VENTRICLES: No acute intracranial hemorrhage. No mass effect or midline shift. No extra-axial fluid collection. No evidence of acute infarct. No hydrocephalus. Remote left  cerebellar infarcts. Global cortical atrophy. Subcortical and periventricular small vessel ischemic changes. Intracranial atherosclerosis. ORBITS: No acute abnormality. SINUSES AND MASTOIDS: No acute abnormality. SOFT TISSUES AND SKULL: No acute skull fracture. No acute soft tissue abnormality. IMPRESSION: 1. No acute intracranial abnormality. 2. Remote left cerebellar infarcts. Electronically signed by: Pinkie Pebbles MD 08/21/2024 11:35 PM EST RP Workstation: [id number]   DG Chest Portable 1 View Result Date: 08/21/2024 EXAM: 1 VIEW(S) XRAY OF THE CHEST 08/21/2024 11:13:00 PM COMPARISON: Treatment 04/11/2221. CLINICAL HISTORY: weakness FINDINGS: LUNGS AND PLEURA: No focal pulmonary opacity. No pleural effusion. No pneumothorax. HEART AND MEDIASTINUM: Aortic atherosclerosis. No acute abnormality of the cardiac and mediastinal silhouettes. BONES AND SOFT TISSUES: Multilevel thoracic osteophytosis. Dextroconvex thoracolumbar scoliosis. IMPRESSION: 1. No acute cardiopulmonary process identified. Electronically signed by: Greig Pique MD 08/21/2024 11:16 PM EST RP Workstation: [id number]      Discharge Exam: Vitals:   08/25/24 0808 08/25/24 1151  BP: (!) 163/82 135/65  Pulse: 69 (!) 59  Resp:    Temp: 97.9 F (36.6 C) 98.5 F (36.9 C)  SpO2: 95% 96%   Vitals:   08/25/24 0008 08/25/24 0424 08/25/24 0808 08/25/24 1151  BP: (!) 187/81 (!) 146/61 (!) 163/82 135/65  Pulse: 68 66 69 (!) 59  Resp: 18 18    Temp: 98.2 F (36.8 C) 98.5 F (36.9 C) 97.9 F (36.6 C) 98.5 F (36.9 C)  TempSrc:    Oral  SpO2: 100% 96% 95% 96%  Weight:      Height:  Constitutional:  Normal appearance. Non toxic-appearing.  HENT: Head Normocephalic and atraumatic.  Mucous membranes are moist.  Eyes:  Extraocular intact. Conjunctivae normal.  Cardiovascular: Rate and Rhythm: Normal rate and regular rhythm.  Pulmonary: Non labored, symmetric rise of chest wall.  Skin: warm and dry. not jaundiced.   Neurological: Awake, alert, oriented x 4.  Significant improvement in her speech finding difficulties.  Excellent speech fluency today Psychiatric: Mood and Affect congruent.    The results of significant diagnostics from this hospitalization (including imaging, microbiology, ancillary and laboratory) are listed below for reference.     Microbiology: Recent Results (from the past 240 hours)  Resp panel by RT-PCR (RSV, Flu A&B, Covid) Anterior Nasal Swab     Status: None   Collection Time: 08/21/24 11:45 PM   Specimen: Anterior Nasal Swab  Result Value Ref Range Status   SARS Coronavirus 2 by RT PCR NEGATIVE NEGATIVE Final    Comment: (NOTE) SARS-CoV-2 target nucleic acids are NOT DETECTED.  The SARS-CoV-2 RNA is generally detectable in upper respiratory specimens during the acute phase of infection. The lowest concentration of SARS-CoV-2 viral copies this assay can detect is 138 copies/mL. A negative result does not preclude SARS-Cov-2 infection and should not be used as the sole basis for treatment or other patient management decisions. A negative result may occur with  improper specimen collection/handling, submission of specimen other than nasopharyngeal swab, presence of viral mutation(s) within the areas targeted by this assay, and inadequate number of viral copies(<138 copies/mL). A negative result must be combined with clinical observations, patient history, and epidemiological information. The expected result is Negative.  Fact Sheet for Patients:  bloggercourse.com  Fact Sheet for Healthcare Providers:  seriousbroker.it  This test is no t yet approved or cleared by the [place]  FDA and  has been authorized for detection and/or diagnosis of SARS-CoV-2 by FDA under an Emergency Use Authorization (EUA). This EUA will remain  in effect (meaning this test can be used) for the duration of the COVID-19 declaration under  Section 564(b)(1) of the Act, 21 U.S.C.section 360bbb-3(b)(1), unless the authorization is terminated  or revoked sooner.       Influenza A by PCR NEGATIVE NEGATIVE Final   Influenza B by PCR NEGATIVE NEGATIVE Final    Comment: (NOTE) The Xpert Xpress SARS-CoV-2/FLU/RSV plus assay is intended as an aid in the diagnosis of influenza from Nasopharyngeal swab specimens and should not be used as a sole basis for treatment. Nasal washings and aspirates are unacceptable for Xpert Xpress SARS-CoV-2/FLU/RSV testing.  Fact Sheet for Patients: bloggercourse.com  Fact Sheet for Healthcare Providers: seriousbroker.it  This test is not yet approved or cleared by the [place]  FDA and has been authorized for detection and/or diagnosis of SARS-CoV-2 by FDA under an Emergency Use Authorization (EUA). This EUA will remain in effect (meaning this test can be used) for the duration of the COVID-19 declaration under Section 564(b)(1) of the Act, 21 U.S.C. section 360bbb-3(b)(1), unless the authorization is terminated or revoked.     Resp Syncytial Virus by PCR NEGATIVE NEGATIVE Final    Comment: (NOTE) Fact Sheet for Patients: bloggercourse.com  Fact Sheet for Healthcare Providers: seriousbroker.it  This test is not yet approved or cleared by the [place]  FDA and has been authorized for detection and/or diagnosis of SARS-CoV-2 by FDA under an Emergency Use Authorization (EUA). This EUA will remain in effect (meaning this test can be used) for the duration of the COVID-19 declaration under Section 564(b)(1)  of the Act, 21 U.S.C. section 360bbb-3(b)(1), unless the authorization is terminated or revoked.  Performed at [facility], [address] [place], [place] [address]      Labs: BNP (last 3 results) No results for input(s): BNP in the last 8760 hours. Basic  Metabolic Panel: Recent Labs  Lab 08/21/24 2255 08/22/24 1119  NA 141 139  K 4.0 3.8  CL 102 105  CO2 25 24  GLUCOSE 115* 99  BUN 19 12  CREATININE 0.61 0.58  CALCIUM  9.8 9.3   Liver Function Tests: Recent Labs  Lab 08/21/24 2255  AST 28  ALT 20  ALKPHOS 78  BILITOT 0.3  PROT 7.1  ALBUMIN 4.5   No results for input(s): LIPASE, AMYLASE in the last 168 hours. No results for input(s): AMMONIA in the last 168 hours. CBC: Recent Labs  Lab 08/21/24 2255 08/22/24 1119  WBC 6.9 5.6  HGB 14.0 13.1  HCT 40.1 37.5  MCV 92.4 91.5  PLT 245 219   Cardiac Enzymes: No results for input(s): CKTOTAL, CKMB, CKMBINDEX, TROPONINI in the last 168 hours. BNP: Invalid input(s): POCBNP CBG: No results for input(s): GLUCAP in the last 168 hours. D-Dimer No results for input(s): DDIMER in the last 72 hours. Hgb A1c No results for input(s): HGBA1C in the last 72 hours. Lipid Profile No results for input(s): CHOL, HDL, LDLCALC, TRIG, CHOLHDL, LDLDIRECT in the last 72 hours. Thyroid  function studies No results for input(s): TSH, T4TOTAL, T3FREE, THYROIDAB in the last 72 hours.  Invalid input(s): FREET3 Anemia work up No results for input(s): VITAMINB12, FOLATE, FERRITIN, TIBC, IRON, RETICCTPCT in the last 72 hours. Urinalysis    Component Value Date/Time   COLORURINE YELLOW (A) 08/21/2024 2310   APPEARANCEUR CLEAR (A) 08/21/2024 2310   LABSPEC 1.004 (L) 08/21/2024 2310   PHURINE 7.0 08/21/2024 2310   GLUCOSEU NEGATIVE 08/21/2024 2310   GLUCOSEU NEGATIVE 09/20/2019 1602   HGBUR NEGATIVE 08/21/2024 2310   BILIRUBINUR NEGATIVE 08/21/2024 2310   BILIRUBINUR neg 09/20/2019 1609   KETONESUR NEGATIVE 08/21/2024 2310   PROTEINUR NEGATIVE 08/21/2024 2310   UROBILINOGEN 0.2 09/20/2019 1609   UROBILINOGEN 0.2 09/20/2019 1602   NITRITE NEGATIVE 08/21/2024 2310   LEUKOCYTESUR NEGATIVE 08/21/2024 2310   Sepsis Labs Recent Labs   Lab 08/21/24 2255 08/22/24 1119  WBC 6.9 5.6   Microbiology Recent Results (from the past 240 hours)  Resp panel by RT-PCR (RSV, Flu A&B, Covid) Anterior Nasal Swab     Status: None   Collection Time: 08/21/24 11:45 PM   Specimen: Anterior Nasal Swab  Result Value Ref Range Status   SARS Coronavirus 2 by RT PCR NEGATIVE NEGATIVE Final    Comment: (NOTE) SARS-CoV-2 target nucleic acids are NOT DETECTED.  The SARS-CoV-2 RNA is generally detectable in upper respiratory specimens during the acute phase of infection. The lowest concentration of SARS-CoV-2 viral copies this assay can detect is 138 copies/mL. A negative result does not preclude SARS-Cov-2 infection and should not be used as the sole basis for treatment or other patient management decisions. A negative result may occur with  improper specimen collection/handling, submission of specimen other than nasopharyngeal swab, presence of viral mutation(s) within the areas targeted by this assay, and inadequate number of viral copies(<138 copies/mL). A negative result must be combined with clinical observations, patient history, and epidemiological information. The expected result is Negative.  Fact Sheet for Patients:  bloggercourse.com  Fact Sheet for Healthcare Providers:  seriousbroker.it  This test is no t yet  approved or cleared by the [place]  FDA and  has been authorized for detection and/or diagnosis of SARS-CoV-2 by FDA under an Emergency Use Authorization (EUA). This EUA will remain  in effect (meaning this test can be used) for the duration of the COVID-19 declaration under Section 564(b)(1) of the Act, 21 U.S.C.section 360bbb-3(b)(1), unless the authorization is terminated  or revoked sooner.       Influenza A by PCR NEGATIVE NEGATIVE Final   Influenza B by PCR NEGATIVE NEGATIVE Final    Comment: (NOTE) The Xpert Xpress SARS-CoV-2/FLU/RSV plus assay is  intended as an aid in the diagnosis of influenza from Nasopharyngeal swab specimens and should not be used as a sole basis for treatment. Nasal washings and aspirates are unacceptable for Xpert Xpress SARS-CoV-2/FLU/RSV testing.  Fact Sheet for Patients: bloggercourse.com  Fact Sheet for Healthcare Providers: seriousbroker.it  This test is not yet approved or cleared by the [place]  FDA and has been authorized for detection and/or diagnosis of SARS-CoV-2 by FDA under an Emergency Use Authorization (EUA). This EUA will remain in effect (meaning this test can be used) for the duration of the COVID-19 declaration under Section 564(b)(1) of the Act, 21 U.S.C. section 360bbb-3(b)(1), unless the authorization is terminated or revoked.     Resp Syncytial Virus by PCR NEGATIVE NEGATIVE Final    Comment: (NOTE) Fact Sheet for Patients: bloggercourse.com  Fact Sheet for Healthcare Providers: seriousbroker.it  This test is not yet approved or cleared by the [place]  FDA and has been authorized for detection and/or diagnosis of SARS-CoV-2 by FDA under an Emergency Use Authorization (EUA). This EUA will remain in effect (meaning this test can be used) for the duration of the COVID-19 declaration under Section 564(b)(1) of the Act, 21 U.S.C. section 360bbb-3(b)(1), unless the authorization is terminated or revoked.  Performed at [facility], [address] [place], [place] [address]      Time coordinating discharge: 32 min    SIGNED: Lorane Poland, DO [facility] 08/25/2024, 1:50 PM Pager   If 7PM-7AM, please contact night-coverage     [1]  Allergies Allergen Reactions   Atorvastatin  Other (See Comments)    myalgias   Brompheniramine-Pseudoeph     REACTION: agitation, nervous, sleepless   Codeine     REACTION: GI Upset   Diphenhydramine  Hcl      REACTION: nervous, sleepless, agitated   Hctz [Hydrochlorothiazide ] Other (See Comments)    Severe hyponatremia   Loratadine     REACTION: nervous, sleeplessness   Other Other (See Comments)    Tape left burn-type marks on face after Cataract surgery   Tape     Tape left burn-type marks on face after Cataract surgery   Triamterene      Hyperkalemia and hyponatremia   Tramadol  Hcl Rash    hyperactivity

## 2024-08-26 ENCOUNTER — Ambulatory Visit: Payer: Self-pay

## 2024-08-26 ENCOUNTER — Other Ambulatory Visit: Payer: Self-pay

## 2024-08-26 ENCOUNTER — Telehealth: Payer: Self-pay

## 2024-08-26 ENCOUNTER — Emergency Department (HOSPITAL_BASED_OUTPATIENT_CLINIC_OR_DEPARTMENT_OTHER)

## 2024-08-26 ENCOUNTER — Encounter (HOSPITAL_BASED_OUTPATIENT_CLINIC_OR_DEPARTMENT_OTHER): Payer: Self-pay

## 2024-08-26 ENCOUNTER — Emergency Department (HOSPITAL_BASED_OUTPATIENT_CLINIC_OR_DEPARTMENT_OTHER)
Admission: EM | Admit: 2024-08-26 | Discharge: 2024-08-26 | Disposition: A | Discharge status: Home / Self Care | Source: Ambulatory Visit | Attending: Emergency Medicine | Admitting: Emergency Medicine

## 2024-08-26 ENCOUNTER — Emergency Department (HOSPITAL_COMMUNITY)

## 2024-08-26 DIAGNOSIS — Z8673 Personal history of transient ischemic attack (TIA), and cerebral infarction without residual deficits: Secondary | ICD-10-CM | POA: Diagnosis not present

## 2024-08-26 DIAGNOSIS — R479 Unspecified speech disturbances: Secondary | ICD-10-CM | POA: Insufficient documentation

## 2024-08-26 DIAGNOSIS — I110 Hypertensive heart disease with heart failure: Secondary | ICD-10-CM | POA: Insufficient documentation

## 2024-08-26 DIAGNOSIS — R29898 Other symptoms and signs involving the musculoskeletal system: Secondary | ICD-10-CM

## 2024-08-26 DIAGNOSIS — Z79899 Other long term (current) drug therapy: Secondary | ICD-10-CM | POA: Insufficient documentation

## 2024-08-26 DIAGNOSIS — I6381 Other cerebral infarction due to occlusion or stenosis of small artery: Secondary | ICD-10-CM | POA: Diagnosis not present

## 2024-08-26 DIAGNOSIS — I509 Heart failure, unspecified: Secondary | ICD-10-CM | POA: Diagnosis not present

## 2024-08-26 DIAGNOSIS — Z7982 Long term (current) use of aspirin: Secondary | ICD-10-CM | POA: Insufficient documentation

## 2024-08-26 DIAGNOSIS — R29818 Other symptoms and signs involving the nervous system: Secondary | ICD-10-CM | POA: Diagnosis not present

## 2024-08-26 DIAGNOSIS — R531 Weakness: Secondary | ICD-10-CM | POA: Diagnosis not present

## 2024-08-26 DIAGNOSIS — I6782 Cerebral ischemia: Secondary | ICD-10-CM | POA: Diagnosis not present

## 2024-08-26 LAB — CBC
HCT: 41.3 % (ref 36.0–46.0)
Hemoglobin: 14.2 g/dL (ref 12.0–15.0)
MCH: 32.1 pg (ref 26.0–34.0)
MCHC: 34.4 g/dL (ref 30.0–36.0)
MCV: 93.4 fL (ref 80.0–100.0)
Platelets: 229 K/uL (ref 150–400)
RBC: 4.42 MIL/uL (ref 3.87–5.11)
RDW: 12 % (ref 11.5–15.5)
WBC: 6.5 K/uL (ref 4.0–10.5)
nRBC: 0 % (ref 0.0–0.2)

## 2024-08-26 LAB — COMPREHENSIVE METABOLIC PANEL WITH GFR
ALT: 30 U/L (ref 0–44)
AST: 43 U/L — ABNORMAL HIGH (ref 15–41)
Albumin: 4.2 g/dL (ref 3.5–5.0)
Alkaline Phosphatase: 61 U/L (ref 38–126)
Anion gap: 10 (ref 5–15)
BUN: 20 mg/dL (ref 8–23)
CO2: 27 mmol/L (ref 22–32)
Calcium: 9.9 mg/dL (ref 8.9–10.3)
Chloride: 101 mmol/L (ref 98–111)
Creatinine, Ser: 0.64 mg/dL (ref 0.44–1.00)
GFR, Estimated: >60 mL/min (ref >60)
Glucose, Bld: 96 mg/dL (ref 70–99)
Potassium: 4.2 mmol/L (ref 3.5–5.1)
Sodium: 138 mmol/L (ref 135–145)
Total Bilirubin: 0.4 mg/dL (ref 0.0–1.2)
Total Protein: 6.7 g/dL (ref 6.5–8.1)

## 2024-08-26 LAB — URINE DRUG SCREEN
Amphetamines: NEGATIVE
Barbiturates: NEGATIVE
Benzodiazepines: NEGATIVE
Cocaine: NEGATIVE
Fentanyl: NEGATIVE
Methadone Scn, Ur: NEGATIVE
Opiates: NEGATIVE
Tetrahydrocannabinol: NEGATIVE

## 2024-08-26 LAB — DIFFERENTIAL
Abs Immature Granulocytes: 0.02 K/uL (ref 0.00–0.07)
Basophils Absolute: 0 K/uL (ref 0.0–0.1)
Basophils Relative: 1 %
Eosinophils Absolute: 0.3 K/uL (ref 0.0–0.5)
Eosinophils Relative: 4 %
Immature Granulocytes: 0 %
Lymphocytes Relative: 25 %
Lymphs Abs: 1.6 K/uL (ref 0.7–4.0)
Monocytes Absolute: 0.6 K/uL (ref 0.1–1.0)
Monocytes Relative: 9 %
Neutro Abs: 4.1 K/uL (ref 1.7–7.7)
Neutrophils Relative %: 61 %

## 2024-08-26 LAB — PROTIME-INR
INR: 1 (ref 0.8–1.2)
Prothrombin Time: 13.7 s (ref 11.4–15.2)

## 2024-08-26 LAB — APTT: aPTT: 31 s (ref 24–36)

## 2024-08-26 LAB — CBG MONITORING, ED: Glucose-Capillary: 94 mg/dL (ref 70–99)

## 2024-08-26 MED ORDER — IOHEXOL 350 MG/ML SOLN
100.0000 mL | Freq: Once | INTRAVENOUS | Status: AC | PRN
  Administered 2024-08-26: 17:00:00 75 mL via INTRAVENOUS

## 2024-08-26 NOTE — ED Notes (Signed)
 Pt has arrived at the [facility] for mri

## 2024-08-26 NOTE — Telephone Encounter (Signed)
 Patient is in the ED per her chart.

## 2024-08-26 NOTE — ED Notes (Signed)
 Pt A&Ox4. Ambulatory without difficulty. POV with daughter to [facility] ED for MRI.

## 2024-08-26 NOTE — ED Notes (Signed)
 Patient transported to CT

## 2024-08-26 NOTE — Telephone Encounter (Signed)
 FYI Only or Action Required?: FYI only for provider: ED advised.  Patient was last seen in primary care on 05/31/2024 by Marylynn Verneita CROME, MD.  Called Nurse Triage reporting Leg Pain.  Symptoms began yesterday.  Interventions attempted: Nothing.  Symptoms are: unchanged.  Triage Disposition: Go to ED Now (Notify PCP)  Patient/caregiver understands and will follow disposition?: Yes  Copied from CRM #[id number]. Topic: Clinical - Red Word Triage >> Aug 26, 2024 11:25 AM Adelita BRAVO wrote: [facility] that prompted transfer to Nurse Triage: Patient was in the hospital for a stroke, and got out yesterday. Daughter states that the patient is having pain in her right leg near thigh, and daughter is concerned about future signs of a stroke. Erminio on the line. Reason for Disposition  [1] Weakness (i.e., paralysis, loss of muscle strength) of the face, arm / hand, or leg / foot on one side of the body AND [2] sudden onset AND [3] brief (now gone)    Can lift leg, just not as high as left leg, pain  Answer Assessment - Initial Assessment Questions ,  Answer Assessment - Initial Assessment Questions Advised ED now. Pt's daughter will take to nearest ED.  Advised 911: weakness/numbness one side of body, severe HA, confusion, changes to vision, diff breathing, diff/ slurred speech.  Patient's daughter verbalized understanding.  1. SYMPTOM: What is the main symptom you are concerned about? (e.g., weakness, numbness)     Right leg pain, not able to lift as high as left leg. Denies weakness/ numbness, diff walking, standing, or speech, HA, changes to vision or confusion 2. ONSET: When did this start? (e.g., minutes, hours, days; while sleeping)     Last night  4. PATTERN Does this come and go, or has it been constant since it started?  Is it present now?     Pain comes and goes 5. CARDIAC SYMPTOMS: Have you had any of the following symptoms: chest pain, difficulty breathing,  palpitations?     Denies  6. NEUROLOGIC SYMPTOMS: Have you had any of the following symptoms: headache, dizziness, vision loss, double vision, changes in speech, unsteady on your feet?     Denies    Leg Pain: 1. ONSET: When did the pain start?      Last night 2. LOCATION: Where is the pain located?      upper right thigh, anterior 3. PAIN: How bad is the pain?    (Scale 1-10; or mild, moderate, severe)     3/10; comes and goes movement 4. WORK OR EXERCISE: Has there been any recent work or exercise that involved this part of the body?      Stroke, dc hospitalized yesterday      6. OTHER SYMPTOMS: Do you have any other symptoms? (e.g., chest pain, back pain, breathing difficulty, swelling, rash, fever, numbness, weakness) Denies Taking plavix   Protocols used: Leg Pain-A-AH, Neurologic Deficit-A-AH

## 2024-08-26 NOTE — ED Provider Notes (Incomplete)
  EMERGENCY DEPARTMENT AT [facility] Provider Note   CSN: [id number] Arrival date & time: 08/26/24  1238     Patient presents with: Extremity Weakness (R thigh)   Wendy Love is a 83 y.o. female.  Past medical history significant for hypertension, CHF, stroke on 12/13, and PAD who presents today for right thigh/leg weakness since last night around 2000.  Patient reports she took her first dose of Plavix  this morning and was on Lovenox  since hospital admission.  Patient also reporting difficulty getting her words out as quickly as she normally does.  {Add pertinent medical, surgical, social history, OB history to HPI:32947}  Extremity Weakness       Prior to Admission medications  Medication Sig Start Date End Date Taking? Authorizing Provider  amLODipine  (NORVASC ) 5 MG tablet TAKE 1 TABLET (5 MG TOTAL) BY MOUTH DAILY. 07/19/24   Marylynn Verneita CROME, MD  ASPIRIN  LOW DOSE 81 MG tablet TAKE 1 TABLET BY MOUTH EVERY DAY 01/06/23   Brown, Fallon E, NP  b complex vitamins tablet Take 1 tablet by mouth daily.    [provider]  carvedilol  (COREG ) 3.125 MG tablet Take 1 tablet (3.125 mg total) by mouth 2 (two) times daily with a meal. 05/31/24   Marylynn Verneita CROME, MD  Cholecalciferol  (VITAMIN D3) 2000 UNITS TABS Take 5,000 Units by mouth daily.    [provider]  clopidogrel  (PLAVIX ) 75 MG tablet Take 1 tablet (75 mg total) by mouth daily. 08/25/24 08/25/25  Dezii, Alexandra, DO  Coenzyme Q10 (CO Q-10) 100 MG CAPS Take by mouth daily.    [provider]  levothyroxine  (SYNTHROID ) 75 MCG tablet TAKE 1 TABLET BY MOUTH EVERY DAY 07/20/24   Marylynn Verneita CROME, MD  Magnesium  300 MG CAPS Take by mouth daily.    [provider]  melatonin 1 MG TABS tablet Take 2 mg by mouth at bedtime.    [provider]  Multiple Vitamin (MULTIVITAMIN) tablet Take 1 tablet by mouth daily.    [provider]  Omega-3 Fatty Acids (FISH OIL PO)  Take by mouth daily.    [provider]  PROBIOTIC CAPS Take by mouth daily.    [provider]  rosuvastatin  (CRESTOR ) 20 MG tablet Take 1 tablet (20 mg total) by mouth daily. 08/26/24 09/25/24  Dezii, Alexandra, DO  temazepam  (RESTORIL ) 15 MG capsule Take 1 capsule (15 mg total) by mouth at bedtime as needed for sleep. 03/15/24   Marylynn Verneita CROME, MD  Turmeric (QC TUMERIC COMPLEX PO) Take by mouth.    [provider]  valsartan  (DIOVAN ) 160 MG tablet TAKE 1 TABLET BY MOUTH TWICE A DAY 07/27/24   Marylynn Verneita CROME, MD  vitamin C (ASCORBIC ACID) 500 MG tablet Take 1,000 mg by mouth daily.     [provider]  Zinc 50 MG TABS Take 50 mg by mouth daily.    [provider]    Allergies: Atorvastatin , Brompheniramine-pseudoeph, Codeine, Diphenhydramine  hcl, Hctz [hydrochlorothiazide ], Loratadine, Other, Tape, Triamterene , and Tramadol  hcl    Review of Systems  Musculoskeletal:  Positive for extremity weakness.  Neurological:  Positive for speech difficulty, weakness and numbness.    Updated Vital Signs BP (!) 176/78 (BP Location: Left Arm)   Pulse 71   Temp 98 F (36.7 C)   Resp 17   Ht 5' 2 (1.575 m)   Wt 59 kg   SpO2 98%   BMI 23.78 kg/m   Physical Exam Vitals and  nursing note reviewed.  Constitutional:      General: She is not in acute distress.    Appearance: She is well-developed. She is not toxic-appearing.  HENT:     Head: Normocephalic and atraumatic.     Right Ear: External ear normal.     Left Ear: External ear normal.     Nose: Nose normal.  Eyes:     Extraocular Movements: Extraocular movements intact.     Conjunctiva/sclera: Conjunctivae normal.     Pupils: Pupils are equal, round, and reactive to light.  Cardiovascular:     Rate and Rhythm: Normal rate and regular rhythm.     Pulses: Normal pulses.     Heart sounds: Normal heart sounds. No murmur heard. Pulmonary:     Effort: Pulmonary effort is normal. No respiratory  distress.     Breath sounds: Normal breath sounds.  Abdominal:     Palpations: Abdomen is soft.     Tenderness: There is no abdominal tenderness.  Musculoskeletal:        General: No swelling.     Cervical back: Neck supple.  Skin:    General: Skin is warm and dry.     Capillary Refill: Capillary refill takes less than 2 seconds.  Neurological:     General: No focal deficit present.     Mental Status: She is alert and oriented to person, place, and time.     GCS: GCS eye subscore is 4. GCS verbal subscore is 5. GCS motor subscore is 6.     Cranial Nerves: No cranial nerve deficit or facial asymmetry.     Sensory: No sensory deficit.     Motor: Weakness present.     Comments: Patient with her right lower extremity against gravity when compared to left.  Patient neurovascularly intact with +2 dorsalis pedis pulses bilaterally.  Psychiatric:        Mood and Affect: Mood normal.     (all labs ordered are listed, but only abnormal results are displayed) Labs Reviewed  CBG MONITORING, ED    EKG: None  Radiology: No results found.  {Document cardiac monitor, telemetry assessment procedure when appropriate:32947} Procedures   Medications Ordered in the ED - No data to display    {Click here for ABCD2, HEART and other calculators REFRESH Note before signing:1}                              Medical Decision Making Amount and/or Complexity of Data Reviewed Labs: ordered. Radiology: ordered.  Risk Prescription drug management.   This patient presents to the ED for concern of stroke like Sxs, this involves an extensive number of treatment options, and is a complaint that carries with it a high risk of complications and morbidity.  The differential diagnosis includes CVA, previous stroke deficits,   Co morbidities / Chronic conditions that complicate the patient evaluation  CHF, CVA, hypertension   Additional history obtained:  Additional history obtained from  EMR External records from outside source obtained and reviewed including previous admission documents   Lab Tests:  I Ordered, and personally interpreted labs.  The pertinent results include: CBC unremarkable, minimally elevated AST at 43, pro time and INR WNL, APTT WNL, UDS's negative   Imaging Studies ordered:  I ordered imaging studies including CT head Noncon I independently visualized and interpreted imaging which showed subacute infarct at the left caudothalamic junction, corresponding to the finding on the brain MRI  from 08/22/2024.  Chronic small vessel ischemic change and brain atrophy I agree with the radiologist interpretation CT angio head and neck with and without: The left A1 segment is virtually occluded.   Cardiac Monitoring: / EKG:  The patient was maintained on a cardiac monitor.  I personally viewed and interpreted the cardiac monitored which showed an underlying rhythm of: Sinus rhythm   Problem List / ED Course / Critical interventions / Medication management I ordered medication including ***   Reevaluation of the patient after these medicines showed that the patient *** I have reviewed the patients home medicines and have made adjustments as needed   Consultations Obtained:  I requested consultation with the Neurology, Dr. Matthews,  and discussed lab and imaging findings as well as pertinent plan - they recommend: Transfer to [facility] ED for MRI  Test / Admission - Considered:  ***   {Document critical care time when appropriate  Document review of labs and clinical decision tools ie CHADS2VASC2, etc  Document your independent review of radiology images and any outside records  Document your discussion with family members, caretakers and with consultants  Document social determinants of health affecting pt's care  Document your decision making why or why not admission, treatments were needed:32947:::1}   Final diagnoses:  None    ED Discharge Orders      None

## 2024-08-26 NOTE — Telephone Encounter (Signed)
 Copied from CRM #[id number]. Topic: General - Other >> Aug 26, 2024  9:58 AM Franky GRADE wrote: Reason for CRM: Patient's daughter is returning a call received from [facility], She also wanted to let Dr.Tullo know that the patient has developed a minor pain on the right leg, mostly discomfort not severe and only when we lifts her leg.

## 2024-08-26 NOTE — Plan of Care (Signed)
 Neurology plan of care  This is an 83 yo patient hx whitecoat syndrome, ribromuscular dysplasia of renal artery, renovascular hypertension, hypothyroidism, hyperlipidemia, chronic diastolic CHF, insomnia, who presents with new RLE weakness. She was discharged earlier this week from [facility] after presenting with confusion and she was found to have an acute lacunar infarct of L caudothalamic junction. Per neurology consult note that admission she did not have any motor deficits from that stroke. She has been on ASA and plavix  since discharge.  Recommend transfer to [facility] ED for MRI. She may have had a new stroke or extension of her other. She will not necessarily require admission for this. After MRI results please have [facility] EDP reach out to overnight neurohospitalist for further guidance based on imaging findings.  Elida Ross, MD [facility] [phone]  If 7pm- 7am, please page neurology on call as listed in AMION.

## 2024-08-26 NOTE — ED Provider Notes (Signed)
 Patient transferred from [facility] emergency department for MRI.  In short this is an 83 year old female who was recently admitted for CVA with aphasia, hypertension presenting to the emergency department with right lower extremity weakness.  She reported that her symptoms started last night.  She denies any numbness.  Denies any arm involvement and states that she is still been able to ambulate.  She has been hypertensive but is otherwise hemodynamically stable.  Labs within normal range.  No acute abnormality on CT head.  She had CTA that showed multiple areas of stenosis.  MRI brain is pending at this time.  The patient does have mild drift in her right lower extremity, otherwise neurologic exam is intact.  10:22 PM I spoke with Dr. Vanessa from neurology who states the stroke on MRI is the same as prior stroke, just has completed but does explain symptoms. Would not change anything in terms of management. Could offer PT/OT/SNF if concern for ambulatory safety. Patient is ambulating steadily in the emergency department and feel comfortable with discharge home. She was recommended neurology follow up as planned and given strict return precautions.    Kingsley, Khaleah Duer K, DO 08/26/24 2243

## 2024-08-26 NOTE — ED Triage Notes (Signed)
 Presents to ED with c/o R thigh/leg weakness since last night around 2000. Denies other complaints. Able to ambulate with difficulty per pt

## 2024-08-26 NOTE — ED Notes (Signed)
 Pt family asked if it was appropriate to take prescribed meds from home, advised to speak with the RN

## 2024-08-26 NOTE — Transitions of Care (Post Inpatient/ED Visit) (Signed)
 08/26/2024  Name: Wendy Love MRN: [id number] DOB: 1940-10-11  Today's TOC FU Call Status: Today's TOC FU Call Status:: Successful TOC FU Call Completed TOC FU Call Complete Date: 08/26/24  Patient's Name and Date of Birth confirmed. Name, DOB  Transition Care Management Follow-up Telephone Call Date of Discharge: 08/25/24 Discharge Facility: [facility]) Type of Discharge: Inpatient Admission Primary Inpatient Discharge Diagnosis:: stroke How have you been since you were released from the hospital?: Better Any questions or concerns?: No  Items Reviewed: Did you receive and understand the discharge instructions provided?: Yes Medications obtained,verified, and reconciled?: Yes (Medications Reviewed) Any new allergies since your discharge?: No Dietary orders reviewed?: Yes Do you have support at home?: Yes People in Home [RPT]: child(ren), adult  Medications Reviewed Today: Medications Reviewed Today     Reviewed by Emmitt Pan, LPN (Licensed Practical Nurse) on 08/26/24 at [phone]  Med List Status: <None>   Medication Order Taking? Sig Documenting Provider Last Dose Status Informant  amLODipine  (NORVASC ) 5 MG tablet [id number] Yes TAKE 1 TABLET (5 MG TOTAL) BY MOUTH DAILY. Marylynn Verneita CROME, MD  Active Self  ASPIRIN  LOW DOSE 81 MG tablet [id number] Yes TAKE 1 TABLET BY MOUTH EVERY DAY Brown, Fallon E, NP  Active Self           Med Note (LAWS, REGINA C   Tue Mar 25, 2023  3:12 PM) Every other day per patient  b complex vitamins tablet [id number] Yes Take 1 tablet by mouth daily. [provider]  Active Self  carvedilol  (COREG ) 3.125 MG tablet [id number] Yes Take 1 tablet (3.125 mg total) by mouth 2 (two) times daily with a meal. Marylynn Verneita CROME, MD  Active Self  Cholecalciferol  (VITAMIN D3) 2000 UNITS TABS [id number] Yes Take 5,000 Units by mouth daily. [provider]  Active Self  clopidogrel  (PLAVIX ) 75 MG tablet [id number]  Yes Take 1 tablet (75 mg total) by mouth daily. Dezii, Alexandra, DO  Active   Coenzyme Q10 (CO Q-10) 100 MG CAPS [id number] Yes Take by mouth daily. [provider]  Active Self  levothyroxine  (SYNTHROID ) 75 MCG tablet [id number] Yes TAKE 1 TABLET BY MOUTH EVERY DAY Marylynn Verneita CROME, MD  Active Self  Magnesium  300 MG CAPS [id number] Yes Take by mouth daily. [provider]  Active Self  melatonin 1 MG TABS tablet [id number] Yes Take 2 mg by mouth at bedtime. [provider]  Active Self           Med Note ALVENIA, REGINA C   Tue Mar 25, 2023  3:14 PM) As needed  Multiple Vitamin (MULTIVITAMIN) tablet [id number] Yes Take 1 tablet by mouth daily. [provider]  Active Self  Omega-3 Fatty Acids (FISH OIL PO) [id number] Yes Take by mouth daily. [provider]  Active Self  PROBIOTIC CAPS [id number] Yes Take by mouth daily. [provider]  Active Self  rosuvastatin  (CRESTOR ) 20 MG tablet [id number] Yes Take 1 tablet (20 mg total) by mouth daily. Dezii, Alexandra, DO  Active   temazepam  (RESTORIL ) 15 MG capsule [id number] Yes Take 1 capsule (15 mg total) by mouth at bedtime as needed for sleep. Marylynn Verneita CROME, MD  Active Self  Turmeric (QC TUMERIC COMPLEX PO) [id number] Yes Take by mouth. [provider]  Active Self  valsartan  (DIOVAN ) 160 MG tablet [id number] Yes TAKE 1 TABLET BY MOUTH TWICE A DAY Marylynn Verneita CROME, MD  Active Self  vitamin C (ASCORBIC ACID) 500 MG  tablet [id number] Yes Take 1,000 mg by mouth daily.  [provider]  Active Self  Zinc 50 MG TABS [id number] Yes Take 50 mg by mouth daily. [provider]  Active Self            Home Care and Equipment/Supplies: Were Home Health Services Ordered?: NA Any new equipment or medical supplies ordered?: NA  Functional Questionnaire: Do you need assistance with bathing/showering or dressing?: No Do you need assistance with meal preparation?: No Do you need assistance  with eating?: No Do you have difficulty maintaining continence: No Do you need assistance with getting out of bed/getting out of a chair/moving?: No Do you have difficulty managing or taking your medications?: No  Follow up appointments reviewed: PCP Follow-up appointment confirmed?: No (no avail appts, sent message to staff to schedule) MD Provider Line Number:[phone] Given: No Specialist Hospital Follow-up appointment confirmed?: No Reason Specialist Follow-Up Not Confirmed: Patient has Specialist Provider Number and will Call for Appointment Do you need transportation to your follow-up appointment?: No Do you understand care options if your condition(s) worsen?: Yes-patient verbalized understanding    SIGNATURE Julian Lemmings, LPN [facility] Nurse Health Advisor Direct Dial [phone]

## 2024-08-26 NOTE — Discharge Instructions (Addendum)
 You were seen in the emergency department for your left leg weakness.  Your MRI showed that that area of your previous stroke got bigger which means that the stroke has completed which is why you had new symptoms last night.  You should continue to take the medications as recommended and prescribed when you were last in the hospital and should follow-up with neurology as an outpatient as planned.  You should return to the emergency department if you have new numbness or weakness, your weakness is getting worse and you are unable to walk or if you have any other new or concerning symptoms.

## 2024-08-27 ENCOUNTER — Telehealth: Payer: Self-pay

## 2024-08-27 NOTE — Telephone Encounter (Signed)
 Copied from CRM #[id number]. Topic: Clinical - Medication Question >> Aug 27, 2024 10:46 AM Berneda FALCON wrote: Reason for CRM: Patient states she was taking the Asprin every other day and now was told to take it every day. Wants to be sure it was every day and not every other day.  Patient callback is [phone]

## 2024-08-27 NOTE — Telephone Encounter (Signed)
 LMTCB. Please see if pt is able to do a hospital follow up on 09/07/2024 at 9:30 with Dr. Marylynn.

## 2024-08-27 NOTE — Telephone Encounter (Signed)
 Pt was seen at the ED last night for the leg pain.

## 2024-08-27 NOTE — Telephone Encounter (Signed)
 Pt advised via voicemail & mychart to take Aspirin  daily

## 2024-08-27 NOTE — Telephone Encounter (Signed)
 Pt returned the call and has been scheduled for 10:30 on 09/07/2024 per Luke in the front office.

## 2024-08-27 NOTE — Telephone Encounter (Signed)
Pt was seen at the ED last night.

## 2024-08-27 NOTE — Progress Notes (Signed)
 Wendy Love                                          MRN: [id number]   08/27/2024   The [facility] Quality Team Specialist reviewed this patient medical record for the purposes of chart review for care gap closure. The following were reviewed: chart review for care gap closure-controlling blood pressure.    [facility] Quality Team

## 2024-09-07 ENCOUNTER — Encounter: Payer: Self-pay | Admitting: Internal Medicine

## 2024-09-07 ENCOUNTER — Inpatient Hospital Stay: Admitting: Internal Medicine

## 2024-09-07 ENCOUNTER — Ambulatory Visit: Payer: Self-pay | Admitting: Internal Medicine

## 2024-09-07 ENCOUNTER — Ambulatory Visit: Attending: Internal Medicine

## 2024-09-07 VITALS — BP 125/74 | HR 70 | Ht 62.0 in | Wt 128.0 lb

## 2024-09-07 DIAGNOSIS — Z8673 Personal history of transient ischemic attack (TIA), and cerebral infarction without residual deficits: Secondary | ICD-10-CM

## 2024-09-07 DIAGNOSIS — I10 Essential (primary) hypertension: Secondary | ICD-10-CM

## 2024-09-07 DIAGNOSIS — Z09 Encounter for follow-up examination after completed treatment for conditions other than malignant neoplasm: Secondary | ICD-10-CM

## 2024-09-07 DIAGNOSIS — Z79899 Other long term (current) drug therapy: Secondary | ICD-10-CM

## 2024-09-07 DIAGNOSIS — E785 Hyperlipidemia, unspecified: Secondary | ICD-10-CM

## 2024-09-07 LAB — COMPREHENSIVE METABOLIC PANEL WITH GFR
ALT: 21 U/L (ref 3–35)
AST: 21 U/L (ref 5–37)
Albumin: 4.5 g/dL (ref 3.5–5.2)
Alkaline Phosphatase: 52 U/L (ref 39–117)
BUN: 15 mg/dL (ref 6–23)
CO2: 30 meq/L (ref 19–32)
Calcium: 9.6 mg/dL (ref 8.4–10.5)
Chloride: 99 meq/L (ref 96–112)
Creatinine, Ser: 0.66 mg/dL (ref 0.40–1.20)
GFR: 80.63 mL/min
Glucose, Bld: 93 mg/dL (ref 70–99)
Potassium: 4 meq/L (ref 3.5–5.1)
Sodium: 136 meq/L (ref 135–145)
Total Bilirubin: 0.6 mg/dL (ref 0.2–1.2)
Total Protein: 7 g/dL (ref 6.0–8.3)

## 2024-09-07 MED ORDER — CLOPIDOGREL BISULFATE 75 MG PO TABS: 75.0000 mg | ORAL_TABLET | Freq: Every day | ORAL | 1 refills | Status: AC

## 2024-09-07 MED ORDER — ROSUVASTATIN CALCIUM 20 MG PO TABS: 20.0000 mg | ORAL_TABLET | Freq: Every day | ORAL | 1 refills | Status: AC

## 2024-09-07 NOTE — Progress Notes (Unsigned)
 "  Subjective:  Patient ID: Wendy Love, female    DOB: 31-Jul-1941  Age: 83 y.o. MRN: [id number]  CC: There were no encounter diagnoses.   HPI Wendy Love presents for  Chief Complaint  Patient presents with   Hospitalization Follow-up   Admitted to [facility] on Dec 13:  rpesented  with new confusion and word salad.  MRI confirmed acute CVA with ischemia at the left caudothalamic junction and multiple chronic cerebellar small vessel infarcts.  She was admitted for stroke workup.  CTA  head and neck revealed severe left ICA terminus, left MCA origin, bilateral PCA P2 stenosis.  Left ACA is functionally occluded. Echo EF 55-60% with no source of cardiac emboli.  LDL 123. Hemoglobin A1c 4.8%, TSH WNL. She was started on low-dose statin, aspirin  and Plavix .  She was originally pending placement at [facility] inpatient rehab but while awaiting insurance authorization had significant improvement with therapy services and was ultimately declined by insurance.  After discussion with the patient and her daughter they have opted to proceed with home health at discharge   Returned to ER on Dec 18 with RLE weakness.  MRI repeated and reviewed by neurology on call:  Interval enlargement/expansion of previously identified left basal ganglia infarct, now measuring up to 2.2 cm, acute to early subacute in appearance. No significant associated hemorrhage or regional mass effect.no new CVA,  Left MCA territory congruent with symptoms of completed CVA .  Offered SNF for rehab,  declined   Staying with [facility]   Outpatient Medications Prior to Visit  Medication Sig Dispense Refill   amLODipine  (NORVASC ) 5 MG tablet TAKE 1 TABLET (5 MG TOTAL) BY MOUTH DAILY. 90 tablet 0   ASPIRIN  LOW DOSE 81 MG tablet TAKE 1 TABLET BY MOUTH EVERY DAY 120 tablet 3   b complex vitamins tablet Take 1 tablet by mouth daily.     carvedilol  (COREG ) 3.125 MG tablet Take 1 tablet (3.125 mg total) by mouth 2 (two) times daily with  a meal. (Patient taking differently: Take 3.125 mg by mouth daily.) 180 tablet 3   Cholecalciferol  (VITAMIN D3) 2000 UNITS TABS Take 5,000 Units by mouth daily. (Patient taking differently: Take 5,000 Units by mouth once a week.)     clopidogrel  (PLAVIX ) 75 MG tablet Take 1 tablet (75 mg total) by mouth daily. 30 tablet 0   Coenzyme Q10 (CO Q-10) 100 MG CAPS Take by mouth daily.     levothyroxine  (SYNTHROID ) 75 MCG tablet TAKE 1 TABLET BY MOUTH EVERY DAY 90 tablet 0   Magnesium  300 MG CAPS Take by mouth daily.     melatonin 1 MG TABS tablet Take 2 mg by mouth at bedtime.     Multiple Vitamin (MULTIVITAMIN) tablet Take 1 tablet by mouth daily.     Omega-3 Fatty Acids (FISH OIL PO) Take by mouth daily.     PROBIOTIC CAPS Take by mouth daily.     rosuvastatin  (CRESTOR ) 20 MG tablet Take 1 tablet (20 mg total) by mouth daily. 30 tablet 0   temazepam  (RESTORIL ) 15 MG capsule Take 1 capsule (15 mg total) by mouth at bedtime as needed for sleep. (Patient taking differently: Take 15 mg by mouth at bedtime as needed for sleep. Takes 5 mg at bedtime PRN) 30 capsule 2   Turmeric (QC TUMERIC COMPLEX PO) Take by mouth.     valsartan  (DIOVAN ) 160 MG tablet TAKE 1 TABLET BY MOUTH TWICE A DAY 180 tablet 1   vitamin C (  ASCORBIC ACID) 500 MG tablet Take 1,000 mg by mouth daily.      Zinc 50 MG TABS Take 50 mg by mouth daily.     No facility-administered medications prior to visit.    Review of Systems;  Patient denies headache, fevers, malaise, unintentional weight loss, skin rash, eye pain, sinus congestion and sinus pain, sore throat, dysphagia,  hemoptysis , cough, dyspnea, wheezing, chest pain, palpitations, orthopnea, edema, abdominal pain, nausea, melena, diarrhea, constipation, flank pain, dysuria, hematuria, urinary  Frequency, nocturia, numbness, tingling, seizures,  Focal weakness, Loss of consciousness,  Tremor, insomnia, depression, anxiety, and suicidal ideation.      Objective:  BP 125/74    Pulse 70   Ht 5' 2 (1.575 m)   Wt 128 lb (58.1 kg)   SpO2 95%   BMI 23.41 kg/m   BP Readings from Last 3 Encounters:  09/07/24 125/74  08/26/24 (!) 151/69  08/25/24 135/65    Wt Readings from Last 3 Encounters:  09/07/24 128 lb (58.1 kg)  08/26/24 130 lb (59 kg)  08/22/24 128 lb (58.1 kg)    Physical Exam  Lab Results  Component Value Date   HGBA1C 4.8 08/21/2024   HGBA1C 5.3 11/01/2022   HGBA1C 5.4 05/01/2022    Lab Results  Component Value Date   CREATININE 0.64 08/26/2024   CREATININE 0.58 08/22/2024   CREATININE 0.61 08/21/2024    Lab Results  Component Value Date   WBC 6.5 08/26/2024   HGB 14.2 08/26/2024   HCT 41.3 08/26/2024   PLT 229 08/26/2024   GLUCOSE 96 08/26/2024   CHOL 220 (H) 08/22/2024   TRIG 66 08/22/2024   HDL 84 08/22/2024   LDLDIRECT 125.0 05/31/2024   LDLCALC 123 (H) 08/22/2024   ALT 30 08/26/2024   AST 43 (H) 08/26/2024   NA 138 08/26/2024   K 4.2 08/26/2024   CL 101 08/26/2024   CREATININE 0.64 08/26/2024   BUN 20 08/26/2024   CO2 27 08/26/2024   TSH 2.920 08/21/2024   INR 1.0 08/26/2024   HGBA1C 4.8 08/21/2024   MICROALBUR <0.7 11/07/2023    MR BRAIN WO CONTRAST Result Date: 08/26/2024 CLINICAL DATA:  Initial evaluation for acute neuro deficit, stroke. EXAM: MRI HEAD WITHOUT CONTRAST TECHNIQUE: Multiplanar, multiecho pulse sequences of the brain and surrounding structures were obtained without intravenous contrast. COMPARISON:  Prior study from earlier the same day FINDINGS: Brain: Cerebral volume within normal limits. Patchy T2/FLAIR hyperintensity involving the periventricular deep white matter both cerebral hemispheres, consistent chronic small vessel ischemic disease, moderate in nature. Multiple scattered remote cerebellar infarcts noted. There has been interval enlargement/expansion of previously identified left basal ganglia infarct, now measuring up to 2.2 cm, acute to early subacute in appearance with associated FLAIR  signal abnormality. No significant associated hemorrhage or regional mass effect. No other evidence for acute or subacute ischemia. Gray-white matter differentiation otherwise maintained. No acute or significant chronic intracranial blood products. No mass lesion, midline shift or mass effect. No hydrocephalus or extra-axial fluid collection. Pituitary gland within normal limits. Vascular: Major intracranial vascular flow voids are maintained. Skull and upper cervical spine: Craniocervical junction within normal limits. Bone marrow signal intensity normal. No scalp soft tissue abnormality. Sinuses/Orbits: Prior bilateral ocular lens replacement. Paranasal sinuses are largely clear. No significant mastoid effusion. Other: None. IMPRESSION: 1. Interval enlargement/expansion of previously identified left basal ganglia infarct, now measuring up to 2.2 cm, acute to early subacute in appearance. No significant associated hemorrhage or regional mass effect. 2. No  other new acute intracranial abnormality. 3. Underlying moderate chronic microvascular ischemic disease with multiple remote cerebellar infarcts. Electronically Signed   By: Morene Hoard M.D.   On: 08/26/2024 21:53   CT ANGIO HEAD NECK W WO CM Result Date: 08/26/2024 EXAM: CTA Head and Neck with Intravenous Contrast. CT Head without Contrast. CLINICAL HISTORY: Stroke/TIA, determine embolic source. TECHNIQUE: Axial CTA images of the head and neck performed with intravenous contrast. MIP reconstructed images were created and reviewed. Axial computed tomography images of the head/brain performed without intravenous contrast. Note: Per PQRS, the description of internal carotid artery percent stenosis, including 0 percent or normal exam, is based on [employer] American Symptomatic Carotid Endarterectomy Trial (NASCET) criteria. Dose reduction technique was used including one or more of the following: automated exposure control, adjustment of mA and kV according  to patient size, and/or iterative reconstruction. CONTRAST: With and without. COMPARISON: CT angiogram of the head and neck dated 08/22/2024. FINDINGS: CT HEAD: BRAIN: No acute intraparenchymal hemorrhage. No mass lesion. No CT evidence for acute territorial infarct. No midline shift or extra-axial collection. VENTRICLES: No hydrocephalus. ORBITS: The orbits are unremarkable. SINUSES AND MASTOIDS: The paranasal sinuses and mastoid air cells are clear. CTA NECK: COMMON CAROTID ARTERIES: Moderate calcific plaque within the carotid bulbs bilaterally. No dissection or occlusion. INTERNAL CAROTID ARTERIES: Approximately 60% stenosis of the origin of the right internal carotid artery. The right internal carotid artery is moderately tortuous, but otherwise normal in caliber. Calcific plaque within the left carotid bulb with less than 30% stenosis of the origin. The cervical segment of the left internal carotid artery is moderately tortuous. Calcific plaque within the carotid siphons with moderate-to-severe stenosis of the left ICA terminus, approximately 70 to 80%. No dissection or occlusion. VERTEBRAL ARTERIES: The vertebral arteries are codominant and tortuous proximally, but appear normal in caliber, excepting for mild stenosis of the V4 segment of the left vertebral artery. No dissection or occlusion. CTA HEAD: ANTERIOR CEREBRAL ARTERIES: The left A1 segment is virtually occluded. No aneurysm. MIDDLE CEREBRAL ARTERIES: Moderate irregular stenosis of the M1 segment of the left middle cerebral artery distally. Mild irregular stenosis of the right M1 segment. No occlusion. No aneurysm. POSTERIOR CEREBRAL ARTERIES: Moderate-to-severe stenosis of the proximal P2 segment of the right posterior cerebral artery. There is also moderate stenosis of the P2 segment of the left posterior cerebral artery. No occlusion. No aneurysm. BASILAR ARTERY: The basilar artery is patent. No significant stenosis. No occlusion. No aneurysm.  OTHER: Mild calcific plaque within the aortic arch. SOFT TISSUES: No acute finding. No masses or lymphadenopathy. BONES: No acute osseous abnormality. IMPRESSION: 1. Moderate calcific plaque within the carotid bulbs bilaterally, with approximately 60% stenosis of the origin of the right internal carotid artery and less than 30% stenosis of the origin of the left internal carotid artery. 2. Moderate-to-severe stenosis of the left ICA terminus, approximately 70 to 80%. 3. The left A1 segment is virtually more effectively  occluded. 4. Moderate irregular stenosis of the M1 segment of the left middle cerebral artery distally and mild irregular stenosis of the right M1 segment. 5. Moderate-to-severe stenosis of the proximal P2 segment of the right posterior cerebral artery and moderate stenosis of the P2 segment of the left posterior cerebral artery. 6. Mild stenosis of the V4 segment of the left vertebral artery. Electronically signed by: Evalene Coho MD 08/26/2024 06:26 PM EST RP Workstation: [id number]   CT HEAD WO CONTRAST Result Date: 08/26/2024 EXAM: CT HEAD WITHOUT CONTRAST 08/26/2024 02:29:19 PM  TECHNIQUE: CT of the head was performed without the administration of intravenous contrast. Automated exposure control, iterative reconstruction, and/or weight based adjustment of the mA/kV was utilized to reduce the radiation dose to as low as reasonably achievable. COMPARISON: Brain MRI from 08/22/2024. CLINICAL HISTORY: Neuro deficit, acute, stroke suspected. FINDINGS: BRAIN AND VENTRICLES: No acute hemorrhage. Focal area of low attenuation at the left caudothalamic junction, corresponding to the acute infarct identified on brain MRI from 08/22/2024. Remote lacunar infarcts within the left cerebellar hemisphere. Hypoattenuating foci in the cerebral white matter, most likely representing chronic small vessel disease. Prominence of the sulci and ventricles compatible with brain atrophy. No extra-axial collection.  No mass effect or midline shift. ORBITS: No acute abnormality. SINUSES: No acute abnormality. SOFT TISSUES AND SKULL: No acute soft tissue abnormality. No skull fracture. IMPRESSION: 1. Subacute infarct at the left caudothalamic junction, corresponding to the finding on brain MRI from 08/22/2024. 2. Chronic small vessel ischemic change and brain atrophy. . Electronically signed by: Waddell Calk MD 08/26/2024 03:12 PM EST RP Workstation: [id number]    Assessment & Plan:  .There are no diagnoses linked to this encounter.   I spent 34 minutes on the day of this face to face encounter reviewing patient's  most recent visit with cardiology,  nephrology,  and neurology,  prior relevant surgical and non surgical procedures, recent  labs and imaging studies, counseling on weight management,  reviewing the assessment and plan with patient, and post visit ordering and reviewing of  diagnostics and therapeutics with patient  .   Follow-up: No follow-ups on file.   Wendy LITTIE Kettering, MD "

## 2024-09-07 NOTE — Patient Instructions (Addendum)
 For your blood pressure:  You should be taking carvedilol  twice daily ,  and it can be taken with your  other blood pressure medications.  AM And PM   Take both valsartan  tablets daily in the morning .  We will change your tablet to 320 mg when you have used up your current 160 mg tablets so you will only have to take one tablet in the future.   Continue amlodipine  once daily   I have refilled the plavix  and rosuvastatin  .  These are long term    I am ordering a ZIO monitor for you to wear for 2 weeks to monitor your heart rhythms .  It will come to your house and you can apply it to your chest wall.  If you have any trouble applying it you can schedule a RN visit in our office and we will help you

## 2024-09-08 DIAGNOSIS — Z09 Encounter for follow-up examination after completed treatment for conditions other than malignant neoplasm: Secondary | ICD-10-CM | POA: Insufficient documentation

## 2024-09-08 DIAGNOSIS — Z8673 Personal history of transient ischemic attack (TIA), and cerebral infarction without residual deficits: Secondary | ICD-10-CM | POA: Insufficient documentation

## 2024-09-08 MED ORDER — CARVEDILOL 3.125 MG PO TABS: 3.1250 mg | ORAL_TABLET | Freq: Two times a day (BID) | ORAL | 3 refills | Status: AC

## 2024-09-08 NOTE — Assessment & Plan Note (Signed)
Patient is stable post discharge and has no new issues or questions about discharge plans at the visit today for hospital follow up. All labs , imaging studies and progress notes from admission were reviewed with patient today   

## 2024-09-08 NOTE — Assessment & Plan Note (Signed)
 All symptoms are improving.  CVA presumed to be ischemic,  however I have ordered a ZIO monitor given presence of several chronic cerebellar infarcts.  Continue asa and plavix .  She is tolerating statin therapy thus far ,  lfts ordered for today and are normal  Lab Results  Component Value Date   ALT 21 09/07/2024   AST 21 09/07/2024   ALKPHOS 52 09/07/2024   BILITOT 0.6 09/07/2024

## 2024-09-08 NOTE — Assessment & Plan Note (Addendum)
 Corrected patient's use of carvedilol  to twice daily  and advised that she take entire valsartan  dose once daily (320 mg) . Continue amlodipine   5 mg daily

## 2024-09-13 ENCOUNTER — Other Ambulatory Visit: Payer: Self-pay | Admitting: Internal Medicine

## 2024-09-15 ENCOUNTER — Ambulatory Visit

## 2024-09-15 NOTE — Progress Notes (Signed)
 Pt came into office to have ZIO monitor placed

## 2024-09-22 ENCOUNTER — Telehealth: Payer: Self-pay

## 2024-09-22 NOTE — Telephone Encounter (Signed)
 Spoke to patient to make her aware of recent recommendations per Dr Marylynn, MD. Patient verbalized understanding. Patient is asking if Dr Marylynn has any recommendations on a Community Resources on where can go to like group meeting locally to help with her Stroke Recovery.

## 2024-09-22 NOTE — Telephone Encounter (Signed)
 Copied from CRM [id number]. Topic: General - Other >> Sep 22, 2024 11:08 AM Macario HERO wrote: Reason for CRM: Patient stated she is recovering from a stroke and wanted to know if she needs any medical clearance for driving from provider. -- Requesting a follow up call from nurse.

## 2024-09-23 ENCOUNTER — Telehealth: Payer: Self-pay

## 2024-09-23 DIAGNOSIS — Z8673 Personal history of transient ischemic attack (TIA), and cerebral infarction without residual deficits: Secondary | ICD-10-CM

## 2024-09-23 NOTE — Telephone Encounter (Signed)
 Called to make patient aware of Dr Lula recommendations. Patient's daughter got on the phone and states her mother meant to request an Outpatient Facility that assist with physical and speech therapy. She apologized for the miscommunication. Patient's daughter is also requesting a letter or note in writing stating that her mother is cleared to drive since her stroke. Please advise?

## 2024-09-23 NOTE — Addendum Note (Signed)
 Addended by: MARYLYNN VERNEITA CROME on: 09/23/2024 04:35 PM   Modules accepted: Orders

## 2024-09-23 NOTE — Telephone Encounter (Signed)
 Noted

## 2024-09-23 NOTE — Telephone Encounter (Signed)
 Copied from CRM #[id number]. Topic: General - Other >> Sep 23, 2024 12:58 PM Thersia BROCKS wrote: Reason for CRM: Patient called in stated she needs a letter stating she is okay to drive again, stated she has been recovering from a stroke would like for Dr.Tullo to write a letter stating that , would also like a callback once she is able to come pick it up

## 2024-09-23 NOTE — Addendum Note (Signed)
 Addended by: MARYLYNN VERNEITA CROME on: 09/23/2024 01:58 PM   Modules accepted: Orders

## 2024-09-24 NOTE — Telephone Encounter (Signed)
 Patient and daughter were made aware of Dr Lula note. Patient daughter requested a printed copy of the letter be placed up front for pick up, as her mother does not remember her information to log into MyChart to print off the letter. Verbalized understanding and have no further questions at this time.

## 2024-09-24 NOTE — Telephone Encounter (Signed)
 Request has been addressed.  See encounter dated 09/22/24.

## 2024-09-27 NOTE — Addendum Note (Signed)
 Addended by: HARRIETTE RAISIN on: 09/27/2024 05:46 PM   Modules accepted: Orders

## 2024-09-27 NOTE — Addendum Note (Signed)
 Addended by: MARYLYNN VERNEITA CROME on: 09/27/2024 05:57 PM   Modules accepted: Orders

## 2024-09-27 NOTE — Telephone Encounter (Signed)
 I have pended the referral was not sure what diagnosis I should use.

## 2024-09-27 NOTE — Telephone Encounter (Unsigned)
 Copied from CRM [id number]. Topic: Referral - Question >> Sep 27, 2024  2:25 PM Eva FALCON wrote: Reason for CRM: Pt daughter Wendy Love is wondering if the appointment for Jan 21st is necessary. States the clearance for driving has been given and it looks like the referral for speech therapy was placed. I did noticed there is not one placed for outpatient PT, so would it be best to keep appointment for 1/21? Please call daughter [phone] and advise, she would rather not drive 45 mins there and back if not needed.

## 2024-09-29 ENCOUNTER — Ambulatory Visit: Admitting: Internal Medicine

## 2024-09-30 DIAGNOSIS — G4700 Insomnia, unspecified: Secondary | ICD-10-CM | POA: Diagnosis not present

## 2024-09-30 DIAGNOSIS — I773 Arterial fibromuscular dysplasia: Secondary | ICD-10-CM | POA: Diagnosis not present

## 2024-09-30 DIAGNOSIS — I700 Atherosclerosis of aorta: Secondary | ICD-10-CM | POA: Diagnosis not present

## 2024-09-30 DIAGNOSIS — I69398 Other sequelae of cerebral infarction: Secondary | ICD-10-CM | POA: Diagnosis not present

## 2024-09-30 DIAGNOSIS — I081 Rheumatic disorders of both mitral and tricuspid valves: Secondary | ICD-10-CM | POA: Diagnosis not present

## 2024-09-30 DIAGNOSIS — E785 Hyperlipidemia, unspecified: Secondary | ICD-10-CM | POA: Diagnosis not present

## 2024-09-30 DIAGNOSIS — I150 Renovascular hypertension: Secondary | ICD-10-CM | POA: Diagnosis not present

## 2024-09-30 DIAGNOSIS — R531 Weakness: Secondary | ICD-10-CM | POA: Diagnosis not present

## 2024-09-30 DIAGNOSIS — I5032 Chronic diastolic (congestive) heart failure: Secondary | ICD-10-CM | POA: Diagnosis not present

## 2024-09-30 DIAGNOSIS — K219 Gastro-esophageal reflux disease without esophagitis: Secondary | ICD-10-CM | POA: Diagnosis not present

## 2024-09-30 DIAGNOSIS — M4187 Other forms of scoliosis, lumbosacral region: Secondary | ICD-10-CM | POA: Diagnosis not present

## 2024-09-30 DIAGNOSIS — E039 Hypothyroidism, unspecified: Secondary | ICD-10-CM | POA: Diagnosis not present

## 2024-10-06 NOTE — Progress Notes (Signed)
 Wendy Love                                          MRN: [id number]   10/06/2024   The [facility] Quality Team Specialist reviewed this patient medical record for the purposes of chart review for care gap closure. The following were reviewed: abstraction for care gap closure-controlling blood pressure.    [facility] Quality Team

## 2024-10-09 DIAGNOSIS — Z8673 Personal history of transient ischemic attack (TIA), and cerebral infarction without residual deficits: Secondary | ICD-10-CM

## 2024-10-13 ENCOUNTER — Ambulatory Visit

## 2024-10-13 ENCOUNTER — Other Ambulatory Visit: Payer: Self-pay

## 2024-10-13 ENCOUNTER — Ambulatory Visit: Admitting: Physical Therapy

## 2024-10-13 DIAGNOSIS — R41841 Cognitive communication deficit: Secondary | ICD-10-CM

## 2024-10-13 DIAGNOSIS — R269 Unspecified abnormalities of gait and mobility: Secondary | ICD-10-CM

## 2024-10-13 DIAGNOSIS — R4701 Aphasia: Secondary | ICD-10-CM

## 2024-10-13 DIAGNOSIS — R2681 Unsteadiness on feet: Secondary | ICD-10-CM

## 2024-10-13 NOTE — Therapy (Unsigned)
 " OUTPATIENT PHYSICAL THERAPY NEURO EVALUATION   Patient Name: Wendy Love MRN: [id number] DOB:1941/04/20, 84 y.o., female Today's Date: 10/13/2024   PCP: Marylynn Verneita CROME, MD   REFERRING PROVIDER: Marylynn Verneita CROME, MD      END OF SESSION:  PT End of Session - 10/13/24 1506     Visit Number 1    Number of Visits 24    Date for Recertification  01/05/25    PT Start Time 1535    PT Stop Time 1615    PT Time Calculation (min) 40 min    Equipment Utilized During Treatment Gait belt    Activity Tolerance Patient tolerated treatment well    Behavior During Therapy WFL for tasks assessed/performed          Past Medical History:  Diagnosis Date   Anxiety    Bronchitis    Cancer (HCC)    squamous cell   Claudication    a. 11/2021 ABIs: R 1.09, L 1.01.   Coronary artery calcification seen on CT scan    a. 11/2005 Cor CTA: Nl cors. Ca2+ = 0.  EF 56%; b. 05/2023 Cardiac CT: Ca2+ = 63.3 (36th%'ile).   COVID-19 09/01/21   home test    Degenerative disc disease, lumbar    Depression    Diastolic dysfunction    a. 02/2022 Echo: EF 55-60%, no rwma, mild LVH, GrI DD, nl RV size/fxn, RVSP 19.21mmHg, Ao sclerosis w/o stenosis.   Endometriosis 1974   s/p abdominal surgery, 2nd surgery   Fibromuscular dysplasia    Frequent epistaxis 04/13/2021   High cholesterol    Hypertension    Hypothyroidism    Rhinosinusitis    Right renal artery stenosis    a. 10/2021 s/p PTA/DCBA R renal artery; b. 11/2021 Renal duplex: 1-59% bilat RA stenoses; c. 02/2024 U/S: 1-59% bilat RA stenoses.   Sciatica    Past Surgical History:  Procedure Laterality Date   ABDOMINAL HYSTERECTOMY  09/09/1998   APPENDECTOMY     CARPAL TUNNEL RELEASE     bilateral   CATARACT EXTRACTION W/PHACO Left 07/26/2020   Procedure: CATARACT EXTRACTION PHACO AND INTRAOCULAR LENS PLACEMENT (IOC) LEFT 6.30 01:10.2 9.0%;  Surgeon: Mittie Gaskin, MD;  Location: [facility];  Service: Ophthalmology;  Laterality:  Left;   CATARACT EXTRACTION W/PHACO Right 10/18/2020   Procedure: CATARACT EXTRACTION PHACO AND INTRAOCULAR LENS PLACEMENT (IOC) RIGHT 9.18 01:14.6 12.3%;  Surgeon: Mittie Gaskin, MD;  Location: [facility];  Service: Ophthalmology;  Laterality: Right;   dental implant  2023   Endoscopy/colonoscopy  12/09/2010   Schatski's Ring , small hiatal hernia   hemilaminectomy  12/08/2008   L3-L4hemi, L4-L5 microdiskectomy   KNEE ARTHROSCOPY WITH LATERAL MENISECTOMY Right 04/08/2018   Procedure: KNEE ARTHROSCOPY WITH PARTIAL LATERAL MENISECTOMY and debridement.;  Surgeon: Edie Norleen PARAS, MD;  Location: [facility];  Service: Orthopedics;  Laterality: Right;   REFRACTIVE SURGERY Right 06/2022   RENAL ANGIOGRAPHY N/A 10/25/2021   Procedure: RENAL ANGIOGRAPHY;  Surgeon: Marea Selinda RAMAN, MD;  Location: [facility];  Service: Cardiovascular;  Laterality: N/A;   Patient Active Problem List   Diagnosis Date Noted   History of CVA (cerebrovascular accident) 09/08/2024   Hospital discharge follow-up 09/08/2024   Chronic diastolic CHF (congestive heart failure) (HCC) 08/22/2024   Hypertensive urgency 08/22/2024   Stroke (HCC) 08/22/2024   Coronary artery disease due to calcified coronary lesion 11/07/2023   Lump of skin of right upper extremity 12/23/2022   Carotid  stenosis 02/12/2022   Renovascular hypertension 11/09/2021   Personal history of COVID-19 10/11/2020   Hemorrhoid prolapse 09/21/2019   Osteopenia after menopause 02/16/2019   Fibromuscular dysplasia of renal artery 08/18/2018   Synovial plica syndrome of right knee 04/10/2018   Complex tear of lateral meniscus of right knee as current injury 03/06/2018   Autosomal dominant hereditary hemochromatosis 12/31/2017   Myalgia due to statin 12/30/2017   Peripheral artery disease 07/03/2017   Tinnitus 11/16/2013   Encounter for preventive health examination 11/30/2012   Postmenopausal atrophic vaginitis 11/21/2012    Hyperlipidemia 03/21/2010   UNEQUAL LEG LENGTH 02/07/2010   INSOMNIA, CHRONIC 12/19/2009   TREMOR 12/19/2009   Right-sided low back pain with right-sided sciatica 09/27/2008   Hypothyroidism 10/30/2007   SYMPTOMATIC MENOPAUSAL/FEMALE CLIMACTERIC STATES 10/30/2007   White coat syndrome with diagnosis of hypertension 07/30/2007   Osteoarthritis of spine 07/30/2007    ONSET DATE: 08/26/2024  REFERRING DIAG:  Diagnosis  Z86.73 (ICD-10-CM) - History of CVA (cerebrovascular accident)    THERAPY DIAG:  Abnormality of gait and mobility  Unsteadiness on feet  Rationale for Evaluation and Treatment: Rehabilitation  SUBJECTIVE:                                                                                                                                                                                             SUBJECTIVE STATEMENT:  Pt reports that she had a CVA on the 12th in the 13th of January. Feels like the CVA affected her mind more than anything. Was hospitalized for 4 days then d/c'ed home states that she stayed with daughter in [place]. Received only 1 visits of  PT via HH while in [place]. Moved back to [place] 2 weeks. Reports that she has had no falls, trips of stumbles since coming back home. Reports that she does not have much physical deficits, but may need to address her posture.   Pt accompanied by: friend   PERTINENT HISTORY:   From MD visit:  84 yr old female with a history of renovascular hypertension who was [facility]:  rpesented  with new onset confusion and word salad that was noted during a telephone conversation with her son around 9 pm the evening of admission. Patient lives alone so time of onset was unclear. SABRA  MRI confirmed acute CVA with ischemia at the left caudothalamic junction and multiple chronic cerebellar small vessel infarcts.  She was admitted for stroke workup.  CTA  head and neck revealed severe left ICA terminus, left  MCA origin, bilateral PCA P2 stenosis.  Left ACA is functionally occluded. Echo EF 55-60%  with no source of cardiac emboli.  LDL 123. Hemoglobin A1c 4.8%, TSH WNL. She was started on low-dose statin, aspirin  and Plavix .  She was originally pending placement at [facility] inpatient rehab but while awaiting insurance authorization had significant improvement with therapy services and the inpatient rehab was ultimately declined by insurance.  After discussion with the patient and her daughter they  were comfortable to proceed with home health at discharge. Patient has been staying with daughter since discharge on December 17.   PAIN:  Are you having pain? No  PRECAUTIONS: None  RED FLAGS: None   WEIGHT BEARING RESTRICTIONS: No  FALLS: Has patient fallen in last 6 months? No  LIVING ENVIRONMENT: Lives with: lives with their family Lives in: House/apartment Stairs: Yes: External: 2 steps; none Has following equipment at home: Single point cane, Walker - 2 wheeled, and does not use   PLOF: Independent, Independent with basic ADLs, Independent with household mobility without device, Independent with gait, and Independent with transfers  PATIENT GOALS: improve posture   OBJECTIVE:  Note: Objective measures were completed at Evaluation unless otherwise noted.  DIAGNOSTIC FINDINGS:   MR BRAIN WO CONTRAST Result Date: 08/26/2024 CLINICAL DATA:  Initial evaluation for acute neuro deficit, stroke. EXAM: MRI HEAD WITHOUT CONTRAST TECHNIQUE: Multiplanar, multiecho pulse sequences of the brain and surrounding structures were obtained without intravenous contrast. COMPARISON:  Prior study from earlier the same day FINDINGS: Brain: Cerebral volume within normal limits. Patchy T2/FLAIR hyperintensity involving the periventricular deep white matter both cerebral hemispheres, consistent chronic small vessel ischemic disease, moderate in nature. Multiple scattered remote cerebellar infarcts noted. There has been  interval enlargement/expansion of previously identified left basal ganglia infarct, now measuring up to 2.2 cm, acute to early subacute in appearance with associated FLAIR signal abnormality. No significant associated hemorrhage or regional mass effect. No other evidence for acute or subacute ischemia. Gray-white matter differentiation otherwise maintained. No acute or significant chronic intracranial blood products. No mass lesion, midline shift or mass effect. No hydrocephalus or extra-axial fluid collection. Pituitary gland within normal limits. Vascular: Major intracranial vascular flow voids are maintained. Skull and upper cervical spine: Craniocervical junction within normal limits. Bone marrow signal intensity normal. No scalp soft tissue abnormality. Sinuses/Orbits: Prior bilateral ocular lens replacement. Paranasal sinuses are largely clear. No significant mastoid effusion. Other: None. IMPRESSION: 1. Interval enlargement/expansion of previously identified left basal ganglia infarct, now measuring up to 2.2 cm, acute to early subacute in appearance. No significant associated hemorrhage or regional mass effect. 2. No other new acute intracranial abnormality. 3. Underlying moderate chronic microvascular ischemic disease with multiple remote cerebellar infarcts. Electronically Signed   By: Morene Hoard M.D.   On: 08/26/2024 21:53   COGNITION: Overall cognitive status: Within functional limits for tasks assessed   SENSATION: WFL  COORDINATION: Grossly WFL. Limited ROM in the R knee reported by pt due to mensicus pain   EDEMA:  WFL  MUSCLE TONE: WFL  POSTURE: No Significant postural limitations  LOWER EXTREMITY ROM:     Grossly WFL. But pt repotrs mild tightness in the R knee   LOWER EXTREMITY MMT:    MMT Right Eval Left Eval  Hip flexion 4+ 4+  Hip extension 4+ 4+  Hip abduction 5 5  Hip adduction 5 5  Hip internal rotation    Hip external rotation    Knee flexion 4+ 4+   Knee extension 5 5  Ankle dorsiflexion 5 5  Ankle plantarflexion    Ankle inversion  Ankle eversion    (Blank rows = not tested)  BED MOBILITY:  Not tested  TRANSFERS: Sit to stand: Complete Independence  Assistive device utilized: None     Stand to sit: Complete Independence  Assistive device utilized: None     Chair to chair: Complete Independence  Assistive device utilized: None       RAMP:  Not tested  CURB:  Findings: indep   STAIRS: Findings: Level of Assistance: Complete Independence, Stair Negotiation Technique: Alternating Pattern  with No Rails, Number of Stairs: 4, Height of Stairs: 6   , and Comments: mild knee valgus on the RLE GAIT: Findings: Gait Characteristics: step through pattern and decreased stride length, Distance walked: 7ft, Assistive device utilized:None, Level of assistance: Complete Independence, and Comments: no significant deviations   FUNCTIONAL TESTS:  30 seconds chair stand test 13   [facility] PT Assessment - 10/13/24 0001       Mini-BESTest   Sit To Stand Normal: Comes to stand without use of hands and stabilizes independently.    Rise to Toes Normal: Stable for 3 s with maximum height.    Stand on one leg (left) Normal: 20 s.    Stand on one leg (right) Normal: 20 s.    Stand on one leg - lowest score 2    Compensatory Stepping Correction - Forward Moderate: More than one step is required to recover equilibrium    Compensatory Stepping Correction - Backward Moderate: More than one step is required to recover equilibrium    Compensatory Stepping Correction - Left Lateral Moderate: Several steps to recover equilibrium    Compensatory Stepping Correction - Right Lateral Normal: Recovers independently with 1 step (crossover or lateral OK)    Stepping Corredtion Lateral - lowest score 1    Stance - Feet together, eyes open, firm surface  Normal: 30s    Stance - Feet together, eyes closed, foam surface  Normal: 30s    Incline - Eyes Closed  Moderate: Stands independently < 30s OR aligns with surface    Change in Gait Speed Normal: Significantly changes walkling speed without imbalance    Walk with head turns - Horizontal Normal: performs head turns with no change in gait speed and good balance    Walk with pivot turns Normal: Turns with feet close FAST (< 3 steps) with good balance.    Step over obstacles Normal: Able to step over box with minimal change of gait speed and with good balance.    Timed UP & GO with Dual Task Moderate: Dual Task affects either counting OR walking (>10%) when compared to the TUG without Dual Task.   7.83 11.84   Mini-BEST total score 23      Functional Gait  Assessment   Gait Level Surface Walks 20 ft in less than 5.5 sec, no assistive devices, good speed, no evidence for imbalance, normal gait pattern, deviates no more than 6 in outside of the 12 in walkway width.    Change in Gait Speed Able to smoothly change walking speed without loss of balance or gait deviation. Deviate no more than 6 in outside of the 12 in walkway width.    Gait with Horizontal Head Turns Performs head turns smoothly with no change in gait. Deviates no more than 6 in outside 12 in walkway width    Gait with Vertical Head Turns Performs head turns with no change in gait. Deviates no more than 6 in outside 12 in walkway width.  Gait and Pivot Turn Pivot turns safely within 3 sec and stops quickly with no loss of balance.    Step Over Obstacle Is able to step over 2 stacked shoe boxes taped together (9 in total height) without changing gait speed. No evidence of imbalance.    Gait with Narrow Base of Support Ambulates 4-7 steps.    Gait with Eyes Closed Walks 20 ft, uses assistive device, slower speed, mild gait deviations, deviates 6-10 in outside 12 in walkway width. Ambulates 20 ft in less than 9 sec but greater than 7 sec.    Ambulating Backwards Walks 20 ft, uses assistive device, slower speed, mild gait deviations, deviates  6-10 in outside 12 in walkway width.    Steps Alternating feet, no rail.    Total Score 26          Interpretation of scores: Non-Specific Older Adults Cutoff Score: <=22/30 = risk of falls Parkinsons Disease Cutoff score <15/30= fall risk (Hoehn & Yahr 1-4)  Minimally Clinically Important Difference (MCID)  Stroke (acute, subacute, and chronic) = MDC: 4.2 points Vestibular (acute) = MDC: 6 points Community Dwelling Older Adults =  MCID: 4 points Parkinsons Disease  =  MDC: 4.3 points  (Academy of Neurologic Physical Therapy (nd). Functional Gait Assessment. Retrieved from https://www.neuropt.org/docs/default-source/cpgs/core-outcome-measures/function-gait-assessment-pocket-guide-proof9-(2).pdf?sfvrsn=b85f35043_0.)                                                                               TREATMENT DATE:   30 sec sit<>stand: 13 Below average norms: 60-64 M< 14 F< 12; 65-69 M< 12 F< 11; 70-74 M< 12 F< 10; 75-79 M< 11 F< 10; 80-84 M< 10 F< 9; 85-89 M< 8 M< 8; 90-94 M< 7 F< 4.  A below average score indicates a risk for falls.  CDCs STEADI tools and resources         PATIENT EDUCATION: Education details: importance of continued mobility and HEP  Person educated: Patient and friend  Education method: Explanation, Facilities Manager, and Handouts Education comprehension: verbalized understanding  HOME EXERCISE PROGRAM: Access Code: [email or web address] URL: [email or web address] Date: 10/14/2024 Prepared by: Massie Dollar  Exercises - Backward Walking with Counter Support  - 1 x daily - 7 x weekly - 3 sets - 10 reps - Side Stepping with Resistance at Ankles and Counter Support  - 1 x daily - 7 x weekly - 3 sets - 10 reps - Standing 3-Way Leg Reach with Resistance at Ankles and Counter Support  - 1 x daily - 7 x weekly - 3 sets - 10 reps - 2 seconds  hold - Standing Tandem Balance with Counter Support  - 1 x daily - 7 x weekly - 3 sets - 6 reps - 20 seconcds hold -  Tandem Walking with Counter Support  - 1 x daily - 7 x weekly - 3 sets - 10 reps - Feet Together Balance at [employer] Eyes Closed  - 1 x daily - 7 x weekly - 3 sets - 5 reps - 20 seconds  hold  GOALS: Goals reviewed with patient? Yes  SHORT TERM GOALS: Target date: 10/14/2023  Pt will demonstrate independence and understanding of HEP  Baseline: MET Goal status: INITIAL  ASSESSMENT:  CLINICAL IMPRESSION:  Patient is a 84 y.o. female who was seen today for physical therapy evaluation and treatment for recent CVA. PT instructed pt in mobility and functional assessment. Pt performed 13 stands in 30 sec sit<>stand; significantly  better than aged matched norms, Mini best of 23, which indicates reduced fall risk and FGA of 26 which indicates low fall risk. Pt reports that most other deficits following CVA are cognitive in nature, and does note feel like she needs PT at this time. Due to high functional status and low fall risk indicated by functional outcome measures, skilled PT is not indicated at this time.     OBJECTIVE IMPAIRMENTS: decreased cognition.   ACTIVITY LIMITATIONS: caring for others  PARTICIPATION LIMITATIONS: community activity, occupation, and yard work  PERSONAL FACTORS: Age are also affecting patient's functional outcome.   REHAB POTENTIAL: Excellent  CLINICAL DECISION MAKING: Stable/uncomplicated  EVALUATION COMPLEXITY: Low  PLAN:  PT FREQUENCY: 1x/week  PT DURATION: 1 week  PLANNED INTERVENTIONS: 97164- PT Re-evaluation, 97110-Therapeutic exercises, 97530- Therapeutic activity, 97112- Neuromuscular re-education, 97535- Self Care, [id number]- Manual therapy, [phone]- Gait training, Patient/Family education, Balance training, Stair training, Taping, and DME instructions  PLAN FOR NEXT SESSION:   N/a    Massie FORBES Dollar, PT 10/13/2024, 4:02 PM        "

## 2024-10-13 NOTE — Therapy (Signed)
 " OUTPATIENT SPEECH LANGUAGE PATHOLOGY APHASIA EVALUATION   Patient Name: Wendy Love MRN: [id number] DOB:01-Oct-1940, 84 y.o., female Today's Date: 10/13/2024  PCP: Verneita Kettering, MD REFERRING PROVIDER: Verneita Kettering, MD   End of Session - 10/13/24 1543     Visit Number 1    Number of Visits 9    Date for Recertification  11/24/24    SLP Start Time 1445    SLP Stop Time  1530    SLP Time Calculation (min) 45 min    Activity Tolerance Patient tolerated treatment well          Past Medical History:  Diagnosis Date   Anxiety    Bronchitis    Cancer (HCC)    squamous cell   Claudication    a. 11/2021 ABIs: R 1.09, L 1.01.   Coronary artery calcification seen on CT scan    a. 11/2005 Cor CTA: Nl cors. Ca2+ = 0.  EF 56%; b. 05/2023 Cardiac CT: Ca2+ = 63.3 (36th%'ile).   COVID-19 09/01/21   home test    Degenerative disc disease, lumbar    Depression    Diastolic dysfunction    a. 02/2022 Echo: EF 55-60%, no rwma, mild LVH, GrI DD, nl RV size/fxn, RVSP 19.10mmHg, Ao sclerosis w/o stenosis.   Endometriosis 1974   s/p abdominal surgery, 2nd surgery   Fibromuscular dysplasia    Frequent epistaxis 04/13/2021   High cholesterol    Hypertension    Hypothyroidism    Rhinosinusitis    Right renal artery stenosis    a. 10/2021 s/p PTA/DCBA R renal artery; b. 11/2021 Renal duplex: 1-59% bilat RA stenoses; c. 02/2024 U/S: 1-59% bilat RA stenoses.   Sciatica    Past Surgical History:  Procedure Laterality Date   ABDOMINAL HYSTERECTOMY  09/09/1998   APPENDECTOMY     CARPAL TUNNEL RELEASE     bilateral   CATARACT EXTRACTION W/PHACO Left 07/26/2020   Procedure: CATARACT EXTRACTION PHACO AND INTRAOCULAR LENS PLACEMENT (IOC) LEFT 6.30 01:10.2 9.0%;  Surgeon: Mittie Gaskin, MD;  Location: [facility];  Service: Ophthalmology;  Laterality: Left;   CATARACT EXTRACTION W/PHACO Right 10/18/2020   Procedure: CATARACT EXTRACTION PHACO AND INTRAOCULAR LENS PLACEMENT (IOC)  RIGHT 9.18 01:14.6 12.3%;  Surgeon: Mittie Gaskin, MD;  Location: [facility];  Service: Ophthalmology;  Laterality: Right;   dental implant  2023   Endoscopy/colonoscopy  12/09/2010   Schatski's Ring , small hiatal hernia   hemilaminectomy  12/08/2008   L3-L4hemi, L4-L5 microdiskectomy   KNEE ARTHROSCOPY WITH LATERAL MENISECTOMY Right 04/08/2018   Procedure: KNEE ARTHROSCOPY WITH PARTIAL LATERAL MENISECTOMY and debridement.;  Surgeon: Edie Norleen PARAS, MD;  Location: [facility];  Service: Orthopedics;  Laterality: Right;   REFRACTIVE SURGERY Right 06/2022   RENAL ANGIOGRAPHY N/A 10/25/2021   Procedure: RENAL ANGIOGRAPHY;  Surgeon: Marea Selinda RAMAN, MD;  Location: [facility];  Service: Cardiovascular;  Laterality: N/A;   Patient Active Problem List   Diagnosis Date Noted   History of CVA (cerebrovascular accident) 09/08/2024   Hospital discharge follow-up 09/08/2024   Chronic diastolic CHF (congestive heart failure) (HCC) 08/22/2024   Hypertensive urgency 08/22/2024   Stroke (HCC) 08/22/2024   Coronary artery disease due to calcified coronary lesion 11/07/2023   Lump of skin of right upper extremity 12/23/2022   Carotid stenosis 02/12/2022   Renovascular hypertension 11/09/2021   Personal history of COVID-19 10/11/2020   Hemorrhoid prolapse 09/21/2019   Osteopenia after menopause 02/16/2019   Fibromuscular dysplasia of  renal artery 08/18/2018   Synovial plica syndrome of right knee 04/10/2018   Complex tear of lateral meniscus of right knee as current injury 03/06/2018   Autosomal dominant hereditary hemochromatosis 12/31/2017   Myalgia due to statin 12/30/2017   Peripheral artery disease 07/03/2017   Tinnitus 11/16/2013   Encounter for preventive health examination 11/30/2012   Postmenopausal atrophic vaginitis 11/21/2012   Hyperlipidemia 03/21/2010   UNEQUAL LEG LENGTH 02/07/2010   INSOMNIA, CHRONIC 12/19/2009   TREMOR 12/19/2009   Right-sided low  back pain with right-sided sciatica 09/27/2008   Hypothyroidism 10/30/2007   SYMPTOMATIC MENOPAUSAL/FEMALE CLIMACTERIC STATES 10/30/2007   White coat syndrome with diagnosis of hypertension 07/30/2007   Osteoarthritis of spine 07/30/2007    ONSET DATE: 08/26/25   REFERRING DIAG: stroke  THERAPY DIAG:  Aphasia  Cognitive communication deficit  Rationale for Evaluation and Treatment Rehabilitation  SUBJECTIVE:   SUBJECTIVE STATEMENT: Pt alert, pleasant, and cooperative.  Pt accompanied by: friend  PERTINENT HISTORY & DIAGNOSTIC FINDINGS: Ptis a 84 y.o. female who presents for cognitive-communication evaluation in setting of stroke. Pt with medical history significant of whitecoat syndrome, ribromuscular dysplasia of renal artery, renovascular hypertension, Hypothyroidism, hyperlipidemia, chronic diastolic CHF, insomnia, who presents with confusion. MRI, 08/26/25, showed small focus of acute ischemia at the left caudothalamic junction, Multiple chronic cerebellar small vessel infarcts.   PAIN:  Are you having pain? No  FALLS: Has patient fallen in last 6 months?  See PT evaluation for details  LIVING ENVIRONMENT: Lives with: lives alone; friend currently staying with her Lives in: House/apartment  PLOF:  Level of assistance: Independent with ADLs Employment: Retired   PATIENT GOALS    for communication to improve   OBJECTIVE:  COGNITION: Overall cognitive status: further assessment warranted due to aphasia eval  AUDITORY COMPREHENSION: Overall auditory comprehension: Appears intact; with extra time and self correction    READING COMPREHENSION: Intact  EXPRESSION: verbal  VERBAL EXPRESSION: Overall verbal expression: Impaired: simple and moderately complex Level of generative/spontaneous verbalization: phrase and sentence Automatic speech: name: intact and social response: intact  Repetition: Appears intact Naming: Responsive: WFL, Confrontation: WFL, and  Divergent: impaired - 7 animals in 49s Pragmatics: Appears intact  WRITTEN EXPRESSION: Dominant hand: right  Written expression: Not tested  MOTOR SPEECH: Overall motor speech: Appears intact    ORAL MOTOR EXAMINATION No functional deficits  STANDARDIZED ASSESSMENTS:   Western Aphasia Battery- Revised  Spontaneous Speech                           Information content               10/10                                            Fluency                                 9/10                                          Comprehension     Yes/No questions                 60/60  Auditory Word Recognition  60/60                                Sequential Commands       80/80                              Repetition                             96/100                                        Naming    Object Naming                     60/60                                           Word Fluency                        7/20                                            Sentence Completion          10/10                                             Responsive Speech              10/10                                         Aphasia Quotient                  96.6/100         Pt's severity rating was mild as indicated by an Aphasia Quotient of 96.6 (0-25=very severe, 26-50=severe, 51-75=moderate, 76 and above is mild). Pt's presentation is most consistent with anomic subtype. Pt reports wordfinding difficulty most pronounced at night.      PATIENT REPORTED OUTCOME MEASURES (PROM): Communicative Participation Item Bank (CPIB) Age Range: 18+   The Communicative Participation Item Bank (CPIB) is a patient-reported outcomes instrument that targets the construct of communicative participation [employer] et al., 2013). The CPIB was developed with the intent of being appropriate for community-dwelling adults in the variety of conversational situations they frequently  engage in as part of life roles at home, at work, and in social and leisure situations. All items in the CPIB start with the stem, Does your condition interfere with. followed by various conversational situations such as Making a phone call to get information, or Having a conversation in a noisy place. Respondents choose from four response categories to rate the level of interference they experience in that situation, ranging from Not at all, to Very  much. The item bank consists of 46 items, and a paper-and-pencil 10-item disorder-generic short form is available. The CPIB scores are reported as T-scores where 50 = the mean of the calibration sample and standard deviation = 10.  Does your condition interfere with talking with people you know?  NOT AT ALL (value=3) Does your condition interfere with communicating when you need to say something quickly?  A LITTLE (value=2) Does your condition interfere with talking with people you do NOT know?  A LITTLE (value=2) Does your condition interfere with communicating when you are out in the community (e.g., running errands, appointments)?  A LITTLE (value=2) Does your condition interfere with asking questions in a conversation?  NOT AT ALL (value=3) Does your condition interfere with communicating in a small group of people?  A LITTLE (value=2) Does your condition interfere with having a long conversation with someone you know about a book, movie, show or sports event?  A LITTLE (value=2) Does your condition interfere with giving someone DETAILED information?  A LITTLE (value=2) Does your condition interfere with getting your turn in a fast-moving conversation? QUITE A BIT (value=1) Does your condition interfere with trying to persuade a friend or family member to see a different point of view?  A LITTLE (value=2)  Pt's T-Score is 54 which is considered .4 SD above mean of 50.    TODAY'S TREATMENT:  Pt and friend educated re: role of SLP, results  of assessment, domains of cognition, and SLP POC (including compensation, education, and further assessment).    PATIENT EDUCATION: Education details: as above Person educated: Patient and friend Education method: Explanation Education comprehension: verbalized understanding  HOME EXERCISE PROGRAM:   TBD    GOALS:  Goals reviewed with patient? Yes  SHORT TERM GOALS: Target date: 10 sessions  Pt will participate in further assessment of functional cognitive-communication via standardized assessment with additional goals to be added as appropriate.  Baseline: Goal status: INITIAL  2.  With Min A, patient will complete a semantic feature analysis with at least 3 relevant features for 4/5 target words to improve word-finding skills.    Baseline:  Goal status: INITIAL  3.  Pt will utilize preferred strategy for anomia during structured/unstructured tasks with min A.  Baseline:  Goal status: INITIAL    LONG TERM GOALS: Target date: 6 weeks  Pt will communicate complex ideas with modified independence.  Baseline:  Goal status: INITIAL  2.  Pt will demonstrate knowledge of ways to promote cognitive-communication outside of ST.  Baseline:  Goal status: INITIAL   ASSESSMENT:  CLINICAL IMPRESSION: Ptis a 84 y.o. female who presents for cognitive-communication evaluation in setting of stroke. Pt with medical history significant of whitecoat syndrome, ribromuscular dysplasia of renal artery, renovascular hypertension, Hypothyroidism, hyperlipidemia, chronic diastolic CHF, insomnia, who presents with confusion. MRI, 08/26/25, showed small focus of acute ischemia at the left caudothalamic junction, Multiple chronic cerebellar small vessel infarcts.   Assessment today completed via informal means, standardized assessment (WAB-R), and PROM (CPIB). Based on assessment, pt presents with anomic aphasia c/b intermittent wordfinding difficulty and reduced verbal fluency. Pt endorsed  frustration with CLOF re: communication. Pt would also like to participate in further assessment of cognitive-communication.  Recommend course of ST targeting anomic aphasia as well as further assessment and treatment (as indicated) of cognitive-communication to maximize independence for this patient.  OBJECTIVE IMPAIRMENTS include anomia and suspected higher level cognitive-communication deficit. These impairments are limiting patient from effectively communicating at home and in community. Factors affecting  potential to achieve goals and functional outcome are none at this time. Patient will benefit from skilled SLP services to address above impairments and improve overall function.  REHAB POTENTIAL: Good  PLAN: SLP FREQUENCY: 1-2x/week  SLP DURATION: 6 weeks  PLANNED INTERVENTIONS: Cueing hierachy, Cognitive reorganization, Internal/external aids, Functional tasks, Multimodal communication approach, SLP instruction and feedback, Compensatory strategies, and Patient/family education    Delon Bangs, M.S., CCC-SLP Speech-Language Pathologist [place] [facility] [phone] [place])  [facility] [address] [place], [place], [address] Phone: [phone]   Fax:  [phone]  "

## 2024-10-18 ENCOUNTER — Ambulatory Visit

## 2024-10-20 ENCOUNTER — Ambulatory Visit: Admitting: Physical Therapy

## 2024-10-20 ENCOUNTER — Ambulatory Visit

## 2024-10-25 ENCOUNTER — Ambulatory Visit

## 2024-10-27 ENCOUNTER — Ambulatory Visit

## 2024-11-01 ENCOUNTER — Ambulatory Visit

## 2024-11-03 ENCOUNTER — Ambulatory Visit

## 2024-11-08 ENCOUNTER — Ambulatory Visit

## 2024-11-08 ENCOUNTER — Ambulatory Visit: Admitting: Physical Therapy

## 2024-11-10 ENCOUNTER — Ambulatory Visit

## 2024-11-15 ENCOUNTER — Ambulatory Visit

## 2024-11-17 ENCOUNTER — Ambulatory Visit

## 2024-11-22 ENCOUNTER — Ambulatory Visit

## 2024-11-24 ENCOUNTER — Ambulatory Visit

## 2024-11-29 ENCOUNTER — Ambulatory Visit: Admitting: Physical Therapy

## 2024-11-29 ENCOUNTER — Ambulatory Visit

## 2024-11-30 ENCOUNTER — Encounter: Admitting: Internal Medicine

## 2024-12-01 ENCOUNTER — Ambulatory Visit

## 2024-12-06 ENCOUNTER — Ambulatory Visit: Admitting: Physical Therapy

## 2024-12-06 ENCOUNTER — Ambulatory Visit

## 2024-12-08 ENCOUNTER — Ambulatory Visit

## 2024-12-13 ENCOUNTER — Ambulatory Visit

## 2024-12-13 ENCOUNTER — Ambulatory Visit: Admitting: Physical Therapy

## 2024-12-14 ENCOUNTER — Other Ambulatory Visit

## 2024-12-15 ENCOUNTER — Ambulatory Visit: Admitting: Physical Therapy

## 2024-12-15 ENCOUNTER — Ambulatory Visit

## 2024-12-17 ENCOUNTER — Ambulatory Visit: Admitting: Internal Medicine

## 2024-12-20 ENCOUNTER — Ambulatory Visit: Admitting: Physical Therapy

## 2024-12-20 ENCOUNTER — Ambulatory Visit

## 2024-12-22 ENCOUNTER — Ambulatory Visit

## 2024-12-22 ENCOUNTER — Ambulatory Visit: Admitting: Physical Therapy

## 2024-12-27 ENCOUNTER — Ambulatory Visit

## 2024-12-29 ENCOUNTER — Ambulatory Visit: Admitting: Physical Therapy

## 2024-12-29 ENCOUNTER — Ambulatory Visit

## 2025-01-03 ENCOUNTER — Ambulatory Visit

## 2025-01-03 ENCOUNTER — Ambulatory Visit: Admitting: Physical Therapy

## 2025-01-05 ENCOUNTER — Ambulatory Visit

## 2025-01-05 ENCOUNTER — Ambulatory Visit: Admitting: Physical Therapy

## 2025-01-10 ENCOUNTER — Ambulatory Visit

## 2025-01-10 ENCOUNTER — Ambulatory Visit: Admitting: Physical Therapy

## 2025-01-12 ENCOUNTER — Ambulatory Visit: Admitting: Physical Therapy

## 2025-01-12 ENCOUNTER — Ambulatory Visit

## 2025-01-17 ENCOUNTER — Ambulatory Visit

## 2025-01-17 ENCOUNTER — Ambulatory Visit: Admitting: Physical Therapy

## 2025-01-19 ENCOUNTER — Ambulatory Visit: Admitting: Physical Therapy

## 2025-01-19 ENCOUNTER — Ambulatory Visit

## 2025-01-24 ENCOUNTER — Ambulatory Visit

## 2025-01-24 ENCOUNTER — Ambulatory Visit: Admitting: Physical Therapy

## 2025-01-26 ENCOUNTER — Ambulatory Visit: Admitting: Physical Therapy

## 2025-01-26 ENCOUNTER — Ambulatory Visit

## 2025-03-01 ENCOUNTER — Encounter: Admitting: Internal Medicine

## 2025-04-04 ENCOUNTER — Ambulatory Visit

## 2025-08-16 ENCOUNTER — Ambulatory Visit (INDEPENDENT_AMBULATORY_CARE_PROVIDER_SITE_OTHER): Admitting: Vascular Surgery

## 2025-08-16 ENCOUNTER — Encounter (INDEPENDENT_AMBULATORY_CARE_PROVIDER_SITE_OTHER)
